# Patient Record
Sex: Male | Born: 1950 | Race: White | Hispanic: No | Marital: Married
Health system: Midwestern US, Academic
[De-identification: ages and names within clinical notes are randomized; demographics above are authoritative.]

## PROBLEM LIST (undated history)

## (undated) DIAGNOSIS — I10 Essential (primary) hypertension: Secondary | ICD-10-CM

## (undated) DIAGNOSIS — E785 Hyperlipidemia, unspecified: Secondary | ICD-10-CM

## (undated) DIAGNOSIS — F209 Schizophrenia, unspecified: Secondary | ICD-10-CM

## (undated) DIAGNOSIS — F419 Anxiety disorder, unspecified: Secondary | ICD-10-CM

## (undated) DIAGNOSIS — J449 Chronic obstructive pulmonary disease, unspecified: Secondary | ICD-10-CM

## (undated) DIAGNOSIS — K219 Gastro-esophageal reflux disease without esophagitis: Secondary | ICD-10-CM

## (undated) DIAGNOSIS — Z9189 Other specified personal risk factors, not elsewhere classified: Secondary | ICD-10-CM

## (undated) DIAGNOSIS — F32A Depression, unspecified: Secondary | ICD-10-CM

## (undated) DIAGNOSIS — F329 Major depressive disorder, single episode, unspecified: Secondary | ICD-10-CM

## (undated) DIAGNOSIS — B181 Chronic viral hepatitis B without delta-agent: Secondary | ICD-10-CM

---
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
## 2017-08-15 ENCOUNTER — Emergency Department: Payer: Medicare Other

## 2017-08-15 ENCOUNTER — Encounter: Payer: Self-pay | Admitting: Emergency Medicine

## 2017-08-15 ENCOUNTER — Other Ambulatory Visit: Payer: Self-pay

## 2017-08-15 ENCOUNTER — Emergency Department
Admission: EM | Admit: 2017-08-15 | Discharge: 2017-08-15 | Disposition: A | Discharge status: Home / Self Care | Payer: Medicare Other | Attending: Emergency Medicine | Admitting: Emergency Medicine

## 2017-08-15 DIAGNOSIS — F17210 Nicotine dependence, cigarettes, uncomplicated: Secondary | ICD-10-CM | POA: Insufficient documentation

## 2017-08-15 DIAGNOSIS — M25512 Pain in left shoulder: Secondary | ICD-10-CM | POA: Diagnosis present

## 2017-08-15 DIAGNOSIS — M7582 Other shoulder lesions, left shoulder: Secondary | ICD-10-CM

## 2017-08-15 MED ORDER — MELOXICAM 15 MG PO TABS: 15.0000 mg | ORAL_TABLET | Freq: Every day | ORAL | 0 refills | Status: DC

## 2017-08-15 MED ORDER — KETOROLAC TROMETHAMINE 30 MG/ML IJ SOLN
30.0000 mg | Freq: Once | INTRAMUSCULAR | Status: AC
  Administered 2017-08-15: 30 mg via INTRAMUSCULAR
  Filled 2017-08-15: qty 1

## 2017-08-15 NOTE — ED Notes (Signed)
Patient transported to X-ray 

## 2017-08-15 NOTE — ED Provider Notes (Signed)
[facility] EMERGENCY DEPARTMENT Provider Note   CSN: [id number] Arrival date & time: 08/15/17  1748     History   Chief Complaint Chief Complaint  Patient presents with  . Shoulder Injury    HPI Adrian Gillespie is a 67 y.o. male.  Presents today for evaluation of left shoulder pain.  Patient complains of aching left shoulder pain x2 days after falling onto his left shoulder after attempting to help a friend up.  Patient denies injuring any other parts of his body.  No pain except for left proximal shoulder.  He has pain with overhead lifting and relief with shoulder abduction.  He has taken ibuprofen 200 mg every 4-6 hours over the last couple days with mild relief.  No numbness tingling or radicular symptoms.  No headache, head injury, loss of consciousness, nausea vomiting.  No history of left shoulder pain.  HPI  History reviewed. No pertinent past medical history.  There are no active problems to display for this patient.   History reviewed. No pertinent surgical history.      Home Medications    Prior to Admission medications   Medication Sig Start Date End Date Taking? Authorizing Provider  meloxicam (MOBIC) 15 MG tablet Take 1 tablet (15 mg total) by mouth daily. 08/15/17   Evon Slack, PA-C    Family History No family history on file.  Social History Social History   Tobacco Use  . Smoking status: Current Every Day Smoker    Packs/day: 1.00    Types: Cigarettes  Substance Use Topics  . Alcohol use: Not on file  . Drug use: Not on file     Allergies   Patient has no known allergies.   Review of Systems Review of Systems  Constitutional: Negative for fever.  Respiratory: Negative for shortness of breath.   Cardiovascular: Negative for chest pain.  Gastrointestinal: Negative for abdominal pain.  Genitourinary: Negative for difficulty urinating, dysuria and urgency.  Musculoskeletal: Positive for arthralgias. Negative for  back pain, myalgias, neck pain and neck stiffness.  Skin: Negative for rash.  Neurological: Negative for dizziness and headaches.     Physical Exam Updated Vital Signs BP 130/90   Pulse 89   Temp 98.6 F (37 C) (Oral)   Resp 18   Ht 5\' 10"  (1.778 m)   Wt 83.9 kg (185 lb)   SpO2 95%   BMI 26.54 kg/m   Physical Exam  Constitutional: He is oriented to person, place, and time. He appears well-developed and well-nourished.  HENT:  Head: Normocephalic and atraumatic.  Eyes: Conjunctivae are normal.  Neck: Normal range of motion.  Cardiovascular: Normal rate.  Pulmonary/Chest: Effort normal. No respiratory distress.  Musculoskeletal: Normal range of motion.  Examination of the left shoulder shows patient has minimal tenderness along the proximal left shoulder, no clavicular tenderness.  Active abduction and flexion and 90 degrees of passively patient can be increased to 130 and patient can maintain abduction and flexion to 130 degrees.  He has a positive Hawkins, impingement test.  Positive empty can test.  His pain with supraspinatus strength testing but is good internal and external rotation with no discomfort.  He is nervous intact in left upper extremity.  Neurological: He is alert and oriented to person, place, and time.  Skin: Skin is warm. No rash noted.  Psychiatric: He has a normal mood and affect. His behavior is normal. Thought content normal.     ED Treatments / Results  Labs (  all labs ordered are listed, but only abnormal results are displayed) Labs Reviewed - No data to display  EKG None  Radiology Dg Shoulder Left  Result Date: 08/15/2017 CLINICAL DATA:  Patient reports fall onto left shoulder 2 days ago. Reports pain to anterior surface of left shoulder and pain in axillary region of left arm. Denies any previous injuries to left shoulder. EXAM: LEFT SHOULDER - 2+ VIEW COMPARISON:  None. FINDINGS: Glenohumeral joint is intact. No evidence of scapular fracture or  humeral fracture. The acromioclavicular joint is intact. IMPRESSION: No fracture or dislocation. Electronically Signed   By: Genevive BiStewart  Edmunds M.D.   On: 08/15/2017 18:59    Procedures Procedures (including critical care time)  Medications Ordered in ED Medications  ketorolac (TORADOL) 30 MG/ML injection 30 mg (30 mg Intramuscular Given 08/15/17 1829)     Initial Impression / Assessment and Plan / ED Course  I have reviewed the triage vital signs and the nursing notes.  Pertinent labs & imaging results that were available during my care of the patient were reviewed by me and considered in my medical decision making (see chart for details).     67 year old male with left shoulder pain.  Physical exam and history consistent with rotator cuff tendinitis.  X-rays confirm no evidence of acute bony normality.  Patient was given 30 mg of Toradol IM which gave him significant relief of his left shoulder pain and improved range of motion.  Patient will discontinue ibuprofen start Mobic 15 mg daily for 1 week.  Follow-up with orthopedics in 1 week if no improvement of shoulder pain.  He is educated on signs and symptoms to return to the ED for.  Final Clinical Impressions(s) / ED Diagnoses   Final diagnoses:  Rotator cuff tendinitis, left    ED Discharge Orders        Ordered    meloxicam (MOBIC) 15 MG tablet  Daily     08/15/17 1909       Ronnette JuniperGaines, Berlinda Farve C, PA-C 08/15/17 1917    Minna AntisPaduchowski, Kevin, MD 08/15/17 2154

## 2017-08-15 NOTE — ED Notes (Signed)
Returned from XR 

## 2017-08-15 NOTE — ED Notes (Signed)
Pt states he was trying to catch his roommate 3 days ago and hurt his left shoulder. Pt states it is painful to raise his shoulder.  Pt states he has no prior injury to left shoulder.  Pain is 9/10.

## 2017-08-15 NOTE — Discharge Instructions (Addendum)
Please avoid taking ibuprofen for pain.  Take meloxicam 15 mg tablet daily for 1 week as needed for left shoulder pain.  If no improvement after 1 week please follow-up with orthopedist, Dr. Rosita KeaMenz.

## 2017-08-15 NOTE — ED Triage Notes (Signed)
Pt states that his roommate was falling 5 days ago and he attempted to catch him, pt is c/o left shoulder pain with decreased rom

## 2019-04-02 ENCOUNTER — Inpatient Hospital Stay
Admission: EM | Admit: 2019-04-02 | Discharge: 2019-04-05 | DRG: 190 | Disposition: A | Discharge status: Home / Self Care | Payer: Medicare Other | Attending: Internal Medicine | Admitting: Internal Medicine

## 2019-04-02 ENCOUNTER — Emergency Department: Payer: Medicare Other

## 2019-04-02 ENCOUNTER — Other Ambulatory Visit: Payer: Self-pay

## 2019-04-02 ENCOUNTER — Encounter: Payer: Self-pay | Admitting: Emergency Medicine

## 2019-04-02 DIAGNOSIS — I472 Ventricular tachycardia: Secondary | ICD-10-CM

## 2019-04-02 DIAGNOSIS — I4729 Other ventricular tachycardia: Secondary | ICD-10-CM

## 2019-04-02 DIAGNOSIS — I10 Essential (primary) hypertension: Secondary | ICD-10-CM | POA: Diagnosis present

## 2019-04-02 DIAGNOSIS — J449 Chronic obstructive pulmonary disease, unspecified: Secondary | ICD-10-CM

## 2019-04-02 DIAGNOSIS — E876 Hypokalemia: Secondary | ICD-10-CM | POA: Diagnosis present

## 2019-04-02 DIAGNOSIS — Z87891 Personal history of nicotine dependence: Secondary | ICD-10-CM | POA: Diagnosis not present

## 2019-04-02 DIAGNOSIS — I471 Supraventricular tachycardia: Secondary | ICD-10-CM | POA: Diagnosis not present

## 2019-04-02 DIAGNOSIS — Z79899 Other long term (current) drug therapy: Secondary | ICD-10-CM | POA: Diagnosis not present

## 2019-04-02 DIAGNOSIS — F209 Schizophrenia, unspecified: Secondary | ICD-10-CM | POA: Diagnosis present

## 2019-04-02 DIAGNOSIS — J9601 Acute respiratory failure with hypoxia: Secondary | ICD-10-CM

## 2019-04-02 DIAGNOSIS — R9431 Abnormal electrocardiogram [ECG] [EKG]: Secondary | ICD-10-CM | POA: Diagnosis not present

## 2019-04-02 DIAGNOSIS — J440 Chronic obstructive pulmonary disease with acute lower respiratory infection: Secondary | ICD-10-CM | POA: Diagnosis present

## 2019-04-02 DIAGNOSIS — Z20822 Contact with and (suspected) exposure to covid-19: Secondary | ICD-10-CM | POA: Diagnosis present

## 2019-04-02 DIAGNOSIS — T380X5A Adverse effect of glucocorticoids and synthetic analogues, initial encounter: Secondary | ICD-10-CM | POA: Diagnosis not present

## 2019-04-02 DIAGNOSIS — J441 Chronic obstructive pulmonary disease with (acute) exacerbation: Secondary | ICD-10-CM | POA: Diagnosis present

## 2019-04-02 DIAGNOSIS — J209 Acute bronchitis, unspecified: Secondary | ICD-10-CM | POA: Diagnosis not present

## 2019-04-02 HISTORY — DX: Depression, unspecified: F32.A

## 2019-04-02 HISTORY — DX: Schizophrenia, unspecified: F20.9

## 2019-04-02 HISTORY — DX: Chronic obstructive pulmonary disease, unspecified: J44.9

## 2019-04-02 HISTORY — DX: Anxiety disorder, unspecified: F41.9

## 2019-04-02 HISTORY — DX: Other specified personal risk factors, not elsewhere classified: Z91.89

## 2019-04-02 HISTORY — DX: Gastro-esophageal reflux disease without esophagitis: K21.9

## 2019-04-02 HISTORY — DX: Chronic viral hepatitis B without delta-agent: B18.1

## 2019-04-02 HISTORY — DX: Hyperlipidemia, unspecified: E78.5

## 2019-04-02 HISTORY — DX: Essential (primary) hypertension: I10

## 2019-04-02 LAB — CBC WITH DIFFERENTIAL/PLATELET
Abs Immature Granulocytes: 0.04 10*3/uL (ref 0.00–0.07)
Basophils Absolute: 0.1 10*3/uL (ref 0.0–0.1)
Basophils Relative: 1 %
Eosinophils Absolute: 0.6 10*3/uL — ABNORMAL HIGH (ref 0.0–0.5)
Eosinophils Relative: 5 %
HCT: 40.3 % (ref 39.0–52.0)
Hemoglobin: 14 g/dL (ref 13.0–17.0)
Immature Granulocytes: 0 %
Lymphocytes Relative: 17 %
Lymphs Abs: 1.7 10*3/uL (ref 0.7–4.0)
MCH: 29.5 pg (ref 26.0–34.0)
MCHC: 34.7 g/dL (ref 30.0–36.0)
MCV: 85 fL (ref 80.0–100.0)
Monocytes Absolute: 0.9 10*3/uL (ref 0.1–1.0)
Monocytes Relative: 9 %
Neutro Abs: 6.9 10*3/uL (ref 1.7–7.7)
Neutrophils Relative %: 68 %
Platelets: 226 10*3/uL (ref 150–400)
RBC: 4.74 MIL/uL (ref 4.22–5.81)
RDW: 12.7 % (ref 11.5–15.5)
WBC: 10.1 10*3/uL (ref 4.0–10.5)
nRBC: 0 % (ref 0.0–0.2)

## 2019-04-02 LAB — COMPREHENSIVE METABOLIC PANEL
ALT: 11 U/L (ref 0–44)
AST: 15 U/L (ref 15–41)
Albumin: 4.1 g/dL (ref 3.5–5.0)
Alkaline Phosphatase: 70 U/L (ref 38–126)
Anion gap: 10 (ref 5–15)
BUN: <5 mg/dL — ABNORMAL LOW (ref 8–23)
CO2: 26 mmol/L (ref 22–32)
Calcium: 8.5 mg/dL — ABNORMAL LOW (ref 8.9–10.3)
Chloride: 100 mmol/L (ref 98–111)
Creatinine, Ser: 0.8 mg/dL (ref 0.61–1.24)
GFR calc Af Amer: >60 mL/min (ref >60)
GFR calc non Af Amer: >60 mL/min (ref >60)
Glucose, Bld: 118 mg/dL — ABNORMAL HIGH (ref 70–99)
Potassium: 2.8 mmol/L — ABNORMAL LOW (ref 3.5–5.1)
Sodium: 136 mmol/L (ref 135–145)
Total Bilirubin: 0.8 mg/dL (ref 0.3–1.2)
Total Protein: 6.9 g/dL (ref 6.5–8.1)

## 2019-04-02 LAB — CBC
HCT: 40.6 % (ref 39.0–52.0)
Hemoglobin: 13.8 g/dL (ref 13.0–17.0)
MCH: 29.1 pg (ref 26.0–34.0)
MCHC: 34 g/dL (ref 30.0–36.0)
MCV: 85.5 fL (ref 80.0–100.0)
Platelets: 235 10*3/uL (ref 150–400)
RBC: 4.75 MIL/uL (ref 4.22–5.81)
RDW: 12.8 % (ref 11.5–15.5)
WBC: 10.3 10*3/uL (ref 4.0–10.5)
nRBC: 0 % (ref 0.0–0.2)

## 2019-04-02 LAB — POC SARS CORONAVIRUS 2 AG: SARS Coronavirus 2 Ag: NEGATIVE

## 2019-04-02 LAB — HIV ANTIBODY (ROUTINE TESTING W REFLEX): HIV Screen 4th Generation wRfx: NONREACTIVE

## 2019-04-02 LAB — BRAIN NATRIURETIC PEPTIDE: B Natriuretic Peptide: 132 pg/mL — ABNORMAL HIGH (ref 0.0–100.0)

## 2019-04-02 LAB — SARS CORONAVIRUS 2 (TAT 6-24 HRS): SARS Coronavirus 2: NEGATIVE

## 2019-04-02 MED ORDER — LABETALOL HCL 5 MG/ML IV SOLN: 10.0000 mg | Freq: Four times a day (QID) | INTRAVENOUS | Status: DC | PRN

## 2019-04-02 MED ORDER — ENOXAPARIN SODIUM 40 MG/0.4ML ~~LOC~~ SOLN
40.0000 mg | SUBCUTANEOUS | Status: DC
  Administered 2019-04-02 – 2019-04-04 (×3): 40 mg via SUBCUTANEOUS
  Filled 2019-04-02 (×3): qty 0.4

## 2019-04-02 MED ORDER — PANTOPRAZOLE SODIUM 40 MG PO TBEC
40.0000 mg | DELAYED_RELEASE_TABLET | Freq: Every day | ORAL | Status: DC
  Administered 2019-04-02 – 2019-04-05 (×4): 40 mg via ORAL
  Filled 2019-04-02 (×4): qty 1

## 2019-04-02 MED ORDER — TIOTROPIUM BROMIDE MONOHYDRATE 18 MCG IN CAPS
18.0000 ug | ORAL_CAPSULE | Freq: Every day | RESPIRATORY_TRACT | Status: DC
  Administered 2019-04-02 – 2019-04-03 (×2): 18 ug via RESPIRATORY_TRACT
  Filled 2019-04-02: qty 5

## 2019-04-02 MED ORDER — BENZTROPINE MESYLATE 1 MG PO TABS
1.0000 mg | ORAL_TABLET | Freq: Every day | ORAL | Status: DC
  Administered 2019-04-02 – 2019-04-05 (×4): 1 mg via ORAL
  Filled 2019-04-02 (×5): qty 1

## 2019-04-02 MED ORDER — ONDANSETRON HCL 4 MG/2ML IJ SOLN: 4.0000 mg | Freq: Four times a day (QID) | INTRAMUSCULAR | Status: DC | PRN

## 2019-04-02 MED ORDER — IPRATROPIUM-ALBUTEROL 0.5-2.5 (3) MG/3ML IN SOLN
3.0000 mL | Freq: Once | RESPIRATORY_TRACT | Status: AC
  Administered 2019-04-02: 3 mL via RESPIRATORY_TRACT
  Filled 2019-04-02: qty 3

## 2019-04-02 MED ORDER — IPRATROPIUM-ALBUTEROL 0.5-2.5 (3) MG/3ML IN SOLN
3.0000 mL | Freq: Once | RESPIRATORY_TRACT | Status: AC
  Administered 2019-04-02: 14:00:00 3 mL via RESPIRATORY_TRACT
  Filled 2019-04-02: qty 3

## 2019-04-02 MED ORDER — POTASSIUM CHLORIDE IN NACL 40-0.9 MEQ/L-% IV SOLN
INTRAVENOUS | Status: DC
  Administered 2019-04-02 – 2019-04-03 (×2): 75 mL/h via INTRAVENOUS
  Filled 2019-04-02 (×3): qty 1000

## 2019-04-02 MED ORDER — IPRATROPIUM-ALBUTEROL 0.5-2.5 (3) MG/3ML IN SOLN
3.0000 mL | Freq: Four times a day (QID) | RESPIRATORY_TRACT | Status: DC | PRN
  Administered 2019-04-03: 3 mL via RESPIRATORY_TRACT
  Filled 2019-04-02: qty 3

## 2019-04-02 MED ORDER — POTASSIUM CHLORIDE CRYS ER 20 MEQ PO TBCR
40.0000 meq | EXTENDED_RELEASE_TABLET | Freq: Once | ORAL | Status: AC
  Administered 2019-04-02: 40 meq via ORAL
  Filled 2019-04-02: qty 2

## 2019-04-02 MED ORDER — IPRATROPIUM-ALBUTEROL 0.5-2.5 (3) MG/3ML IN SOLN
3.00 | RESPIRATORY_TRACT | Status: DC
Start: 2019-03-31 — End: 2019-04-02

## 2019-04-02 MED ORDER — HALOPERIDOL 5 MG PO TABS
10.0000 mg | ORAL_TABLET | Freq: Two times a day (BID) | ORAL | Status: DC
  Administered 2019-04-02 – 2019-04-05 (×6): 10 mg via ORAL
  Filled 2019-04-02 (×7): qty 2

## 2019-04-02 MED ORDER — METHYLPREDNISOLONE SODIUM SUCC 125 MG IJ SOLR
125.0000 mg | Freq: Once | INTRAMUSCULAR | Status: AC
  Administered 2019-04-02: 13:00:00 125 mg via INTRAVENOUS
  Filled 2019-04-02: qty 2

## 2019-04-02 MED ORDER — ACETAMINOPHEN 325 MG PO TABS
650.0000 mg | ORAL_TABLET | Freq: Four times a day (QID) | ORAL | Status: DC | PRN
  Administered 2019-04-03: 650 mg via ORAL
  Filled 2019-04-02: qty 2

## 2019-04-02 MED ORDER — ACETAMINOPHEN 650 MG RE SUPP: 650.0000 mg | Freq: Four times a day (QID) | RECTAL | Status: DC | PRN

## 2019-04-02 MED ORDER — METHYLPREDNISOLONE SODIUM SUCC 40 MG IJ SOLR
40.0000 mg | Freq: Two times a day (BID) | INTRAMUSCULAR | Status: DC
  Administered 2019-04-02 – 2019-04-05 (×6): 40 mg via INTRAVENOUS
  Filled 2019-04-02 (×6): qty 1

## 2019-04-02 MED ORDER — ONDANSETRON HCL 4 MG PO TABS: 4.0000 mg | ORAL_TABLET | Freq: Four times a day (QID) | ORAL | Status: DC | PRN

## 2019-04-02 MED ORDER — SODIUM CHLORIDE 0.9 % IV SOLN
500.0000 mg | INTRAVENOUS | Status: DC
  Administered 2019-04-02: 500 mg via INTRAVENOUS
  Filled 2019-04-02 (×2): qty 500

## 2019-04-02 NOTE — ED Notes (Signed)
Pt provided meal tray

## 2019-04-02 NOTE — ED Notes (Signed)
Pt provided warm blanket and updated on plan of c are

## 2019-04-02 NOTE — ED Notes (Signed)
Report given to inpatient RN. Transportation requested.  

## 2019-04-02 NOTE — ED Provider Notes (Signed)
[facility] Emergency Department Provider Note    First MD Initiated Contact with Patient 04/02/19 1150     (approximate)  I have reviewed the triage vital signs and the nursing notes.   HISTORY  Chief Complaint Shortness of Breath    HPI Adrian Gillespie is a 69 y.o. male with recent mission the hospital for pneumonia presents the ER for worsening shortness of breath.  Reportedly found by EMS when fire rescue already had patient on nonrebreather due to respiratory distress no report of what his O2 sat was.  States been having wet productive cough.  Complete his antibiotics but does not feel like he is improving.  Just had Covid testing that was negative yesterday.  Does have a history of smoking.  Does not wear home oxygen.  Arrives ER on 4 L nasal cannula.    History reviewed. No pertinent past medical history. History reviewed. No pertinent family history. History reviewed. No pertinent surgical history. There are no problems to display for this patient.     Prior to Admission medications   Not on File    Allergies Patient has no known allergies.    Social History Social History   Tobacco Use  . Smoking status: Current Every Day Smoker    Packs/day: 1.00    Types: Cigarettes  . Smokeless tobacco: Never Used  . Tobacco comment: quit 1 month ago  Substance Use Topics  . Alcohol use: Not on file  . Drug use: Not on file    Review of Systems Patient denies headaches, rhinorrhea, blurry vision, numbness, shortness of breath, chest pain, edema, cough, abdominal pain, nausea, vomiting, diarrhea, dysuria, fevers, rashes or hallucinations unless otherwise stated above in HPI. ____________________________________________   PHYSICAL EXAM:  VITAL SIGNS: Vitals:   04/02/19 1330 04/02/19 1430  BP: (!) 170/102 (!) 155/99  Pulse: 89 73  Resp:    SpO2: 90% 95%    Constitutional: Alert and oriented.  Eyes: Conjunctivae are normal.  Head:  Atraumatic. Nose: No congestion/rhinnorhea. Mouth/Throat: Mucous membranes are moist.   Neck: No stridor. Painless ROM.  Cardiovascular: Normal rate, regular rhythm. Grossly normal heart sounds.  Good peripheral circulation. Respiratory: Normal respiratory effort.  No retractions. Lungs with coarse wheeze and rhonchi throughout all lung fields Gastrointestinal: Soft and nontender. No distention. No abdominal bruits. No CVA tenderness. Genitourinary:  Musculoskeletal: No lower extremity tenderness nor edema.  No joint effusions. Neurologic:  Normal speech and language. No gross focal neurologic deficits are appreciated. No facial droop Skin:  Skin is warm, dry and intact. No rash noted. Psychiatric: Mood and affect are normal. Speech and behavior are normal.  ____________________________________________   LABS (all labs ordered are listed, but only abnormal results are displayed)  Results for orders placed or performed during the hospital encounter of 04/02/19 (from the past 24 hour(s))  CBC with Differential/Platelet     Status: Abnormal   Collection Time: 04/02/19 11:57 AM  Result Value Ref Range   WBC 10.1 4.0 - 10.5 K/uL   RBC 4.74 4.22 - 5.81 MIL/uL   Hemoglobin 14.0 13.0 - 17.0 g/dL   HCT 68.1 15.7 - 26.2 %   MCV 85.0 80.0 - 100.0 fL   MCH 29.5 26.0 - 34.0 pg   MCHC 34.7 30.0 - 36.0 g/dL   RDW 03.5 59.7 - 41.6 %   Platelets 226 150 - 400 K/uL   nRBC 0.0 0.0 - 0.2 %   Neutrophils Relative % 68 %   Neutro  Abs 6.9 1.7 - 7.7 K/uL   Lymphocytes Relative 17 %   Lymphs Abs 1.7 0.7 - 4.0 K/uL   Monocytes Relative 9 %   Monocytes Absolute 0.9 0.1 - 1.0 K/uL   Eosinophils Relative 5 %   Eosinophils Absolute 0.6 (H) 0.0 - 0.5 K/uL   Basophils Relative 1 %   Basophils Absolute 0.1 0.0 - 0.1 K/uL   Immature Granulocytes 0 %   Abs Immature Granulocytes 0.04 0.00 - 0.07 K/uL  Comprehensive metabolic panel     Status: Abnormal   Collection Time: 04/02/19 11:57 AM  Result Value Ref  Range   Sodium 136 135 - 145 mmol/L   Potassium 2.8 (L) 3.5 - 5.1 mmol/L   Chloride 100 98 - 111 mmol/L   CO2 26 22 - 32 mmol/L   Glucose, Bld 118 (H) 70 - 99 mg/dL   BUN <5 (L) 8 - 23 mg/dL   Creatinine, Ser 3.23 0.61 - 1.24 mg/dL   Calcium 8.5 (L) 8.9 - 10.3 mg/dL   Total Protein 6.9 6.5 - 8.1 g/dL   Albumin 4.1 3.5 - 5.0 g/dL   AST 15 15 - 41 U/L   ALT 11 0 - 44 U/L   Alkaline Phosphatase 70 38 - 126 U/L   Total Bilirubin 0.8 0.3 - 1.2 mg/dL   GFR calc non Af Amer >60 >60 mL/min   GFR calc Af Amer >60 >60 mL/min   Anion gap 10 5 - 15  Brain natriuretic peptide     Status: Abnormal   Collection Time: 04/02/19 11:57 AM  Result Value Ref Range   B Natriuretic Peptide 132.0 (H) 0.0 - 100.0 pg/mL  POC SARS Coronavirus 2 Ag     Status: None   Collection Time: 04/02/19  2:15 PM  Result Value Ref Range   SARS Coronavirus 2 Ag NEGATIVE NEGATIVE   ____________________________________________  EKG My review and personal interpretation at Time: 11:49   Indication: sob  Rate: 90  Rhythm: sinus Axis: normal Other: normal intervals, no stemi ____________________________________________  RADIOLOGY  I personally reviewed all radiographic images ordered to evaluate for the above acute complaints and reviewed radiology reports and findings.  These findings were personally discussed with the patient.  Please see medical record for radiology report.  ____________________________________________   PROCEDURES  Procedure(s) performed:  .Critical Care Performed by: Willy Eddy, MD Authorized by: Willy Eddy, MD   Critical care provider statement:    Critical care time (minutes):  30   Critical care time was exclusive of:  Separately billable procedures and treating other patients   Critical care was necessary to treat or prevent imminent or life-threatening deterioration of the following conditions:  Respiratory failure   Critical care was time spent personally by me on the  following activities:  Development of treatment plan with patient or surrogate, discussions with consultants, evaluation of patient's response to treatment, examination of patient, obtaining history from patient or surrogate, ordering and performing treatments and interventions, ordering and review of laboratory studies, ordering and review of radiographic studies, pulse oximetry, re-evaluation of patient's condition and review of old charts      Critical Care performed: yes ____________________________________________   INITIAL IMPRESSION / ASSESSMENT AND PLAN / ED COURSE  Pertinent labs & imaging results that were available during my care of the patient were reviewed by me and considered in my medical decision making (see chart for details).   DDX: Asthma, copd, CHF, pna, ptx, malignancy, Pe, anemia   Adrian Gillespie is a 68 y.o. who presents to the ED with shortness of breath cough as described above.  Patient arrived with EMS with significant tachypnea and wheezing.  Was reported to be in respiratory distress upon their arrival.  Currently protecting his airway.  Exam concerning for COPD with diffuse wheezing.  Blood work will be sent for the but differential.  Will give nebulizers as well as steroids.  Clinical Course as of Apr 02 1446  Sat Apr 02, 2019  1255 Presentation most consistent with COPD exacerbation.  Will give additional nebulizer steroids and observe.   [PR]  1406 Patient has improved work of breathing after nebulizers but noted to be desaturating to the 80s.  Placed on 2 L nasal cannula feels more improved.  Suspect this is more consistent with COPD exacerbation.  Will discuss with hospitalist for admission given his hypoxia.   [PR]    Clinical Course User Index [PR] Adrian Lot, MD    The patient was evaluated in Emergency Department today for the symptoms described in the history of present illness. He/she was evaluated in the context of the global COVID-19  pandemic, which necessitated consideration that the patient might be at risk for infection with the SARS-CoV-2 virus that causes COVID-19. Institutional protocols and algorithms that pertain to the evaluation of patients at risk for COVID-19 are in a state of rapid change based on information released by regulatory bodies including the CDC and federal and state organizations. These policies and algorithms were followed during the patient's care in the ED.  As part of my medical decision making, I reviewed the following data within the [place] notes reviewed and incorporated, Labs reviewed, notes from prior ED visits and [place] Controlled Substance Database   ____________________________________________   FINAL CLINICAL IMPRESSION(S) / ED DIAGNOSES  Final diagnoses:  COPD exacerbation ([place])  Acute respiratory failure with hypoxia ([place])      NEW MEDICATIONS STARTED DURING THIS VISIT:  New Prescriptions   No medications on file     Note:  This document was prepared using Dragon voice recognition software and may include unintentional dictation errors.    Adrian Lot, MD 04/02/19 [phone]

## 2019-04-02 NOTE — ED Notes (Signed)
Requested pharmacy to verify potassium replacement.

## 2019-04-02 NOTE — ED Triage Notes (Signed)
Pt to ER via EMS with c/o SHOB.  Pt diagnosed with pneumonia 2 weeks ago and has completed the Abx without improvement.  Pt with noted coarse crackles.  Pt does not wear O2 at home but arrives on 4L [place] by EMS.

## 2019-04-02 NOTE — ED Notes (Signed)
Pt is aox4, nad noted. Pt has a wet sounding cough and coarse crackles noted bilaterally. Pt is pleasant and conversational. Urinal emptied of 900 ml's urine and environment cleaned.

## 2019-04-02 NOTE — H&P (Signed)
History and Physical    Adrian Gillespie GUY:[id number] DOB: 01-May-1950 DOA: 04/02/2019  PCP: Patient, No Pcp Per   Patient coming from: Home  I have personally briefly reviewed patient's old medical records in [facility]  Chief Complaint: Shortness of breath                                Cough  HPI: Adrian Gillespie is a 69 y.o. male with medical history significant for COPD, hypertension, nicotine dependence and schizophrenia who presented to the emergency room by EMS for evaluation of shortness of breath. He had called EMS because he had difficulty breathing and when they arrived patient was placed on a nonrebreather mask for respiratory distress and  hypoxia (not documented). Shortness of breath is mostly at rest and associated with a cough that is  productive.  Patient recently completed a 1 week course of doxycycline without any improvement in his symptoms was seen in the emergency room 2 days ago for complaints of cough, wheezing and shortness of breath.  Room air pulse oximetry post ambulation was 92% during that visit and so patient was discharged home.  He denies having any chest pain, nausea, vomiting, diaphoresis or palpitations.  Denies having any fever or chills, headache or mental status changes. Denies having abdominal pain, urinary frequency or any changes in his bowel habits    ED Course: Patient with a known history of COPD seen in the ER for an acute exacerbation.  Noted to have pulse oximetry on room air of 87 - 88% improved following oxygen supplementation at 2 L.  Patient received a dose of Solu-Medrol in the ER and potassium supplementation and will be admitted to the hospital.  Review of Systems: As per HPI otherwise 10 point review of systems negative.    History reviewed. No pertinent past medical history.  History reviewed. No pertinent surgical history.   reports that he has been smoking cigarettes. He has been smoking about 1.00 pack per day. He has never used  smokeless tobacco. No history on file for alcohol and drug.  No Known Allergies  History reviewed. No pertinent family history.   Prior to Admission medications   Medication Sig Start Date End Date Taking? Authorizing Provider  albuterol (VENTOLIN HFA) 108 (90 Base) MCG/ACT inhaler Inhale 2 puffs into the lungs every 6 (six) hours as needed for shortness of breath or wheezing.   Yes [provider]  benztropine (COGENTIN) 1 MG tablet Take 1 mg by mouth daily.   Yes [provider]  haloperidol (HALDOL) 10 MG tablet Take 10 mg by mouth 2 (two) times daily.   Yes [provider]  omeprazole (PRILOSEC) 20 MG capsule Take 20 mg by mouth daily.   Yes [provider]  tiotropium (SPIRIVA HANDIHALER) 18 MCG inhalation capsule Place 18 mcg into inhaler and inhale daily.   Yes [provider]    Physical Exam: Vitals:   04/02/19 1330 04/02/19 1430 04/02/19 1500 04/02/19 1500  BP: (!) 170/102 (!) 155/99 (!) 157/99   Pulse: 89 73 78   Resp:      Temp:    98 F (36.7 C)  TempSrc:    Oral  SpO2: 90% 95% 94%   Weight:      Height:         Vitals:   04/02/19 1330 04/02/19 1430 04/02/19 1500 04/02/19 1500  BP: (!) 170/102 (!) 155/99 Marland Kitchen)  157/99   Pulse: 89 73 78   Resp:      Temp:    98 F (36.7 C)  TempSrc:    Oral  SpO2: 90% 95% 94%   Weight:      Height:        Constitutional: NAD, alert and oriented to person place and time Eyes: PERRL, lids and conjunctivae normal ENMT: Mucous membranes are moist.  Neck: normal, supple, no masses, no thyromegaly Respiratory: Diffuse wheezing, no crackles. Normal respiratory effort. No accessory muscle use.  Cardiovascular: Regular rate and rhythm, no murmurs / rubs / gallops. No extremity edema. 2+ pedal pulses. No carotid bruits.  Abdomen: no tenderness, no masses palpated. No hepatosplenomegaly. Bowel sounds positive.  Musculoskeletal: no clubbing / cyanosis. No joint deformity upper and lower  extremities.  Skin: no rashes, lesions, ulcers.  Neurologic: No gross focal neurologic deficit. Psychiatric: Normal mood and affect.   Labs on Admission: I have personally reviewed following labs and imaging studies  CBC: Recent Labs  Lab 04/02/19 1157  WBC 10.1  NEUTROABS 6.9  HGB 14.0  HCT 40.3  MCV 85.0  PLT 226   Basic Metabolic Panel: Recent Labs  Lab 04/02/19 1157  NA 136  K 2.8*  CL 100  CO2 26  GLUCOSE 118*  BUN <5*  CREATININE 0.80  CALCIUM 8.5*   GFR: Estimated Creatinine Clearance: 89.6 mL/min (by C-G formula based on SCr of 0.8 mg/dL). Liver Function Tests: Recent Labs  Lab 04/02/19 1157  AST 15  ALT 11  ALKPHOS 70  BILITOT 0.8  PROT 6.9  ALBUMIN 4.1   No results for input(s): LIPASE, AMYLASE in the last 168 hours. No results for input(s): AMMONIA in the last 168 hours. Coagulation Profile: No results for input(s): INR, PROTIME in the last 168 hours. Cardiac Enzymes: No results for input(s): CKTOTAL, CKMB, CKMBINDEX, TROPONINI in the last 168 hours. BNP (last 3 results) No results for input(s): PROBNP in the last 8760 hours. HbA1C: No results for input(s): HGBA1C in the last 72 hours. CBG: No results for input(s): GLUCAP in the last 168 hours. Lipid Profile: No results for input(s): CHOL, HDL, LDLCALC, TRIG, CHOLHDL, LDLDIRECT in the last 72 hours. Thyroid Function Tests: No results for input(s): TSH, T4TOTAL, FREET4, T3FREE, THYROIDAB in the last 72 hours. Anemia Panel: No results for input(s): VITAMINB12, FOLATE, FERRITIN, TIBC, IRON, RETICCTPCT in the last 72 hours. Urine analysis: No results found for: COLORURINE, APPEARANCEUR, LABSPEC, PHURINE, GLUCOSEU, HGBUR, BILIRUBINUR, KETONESUR, PROTEINUR, UROBILINOGEN, NITRITE, LEUKOCYTESUR  Radiological Exams on Admission: DG Chest Portable 1 View  Result Date: 04/02/2019 CLINICAL DATA:  69 year old who was diagnosed with pneumonia 2 weeks ago in has completed antibiotic therapy, presenting  with persistent cough. Current smoker. EXAM: PORTABLE CHEST 1 VIEW COMPARISON:  None. FINDINGS: Cardiac silhouette normal in size. Mild to moderate central peribronchial thickening. Lungs otherwise clear. No localized airspace consolidation. No pleural effusions. No pneumothorax. Normal pulmonary vascularity. IMPRESSION: Mild-to-moderate changes of bronchitis and/or asthma without focal airspace pneumonia. Electronically Signed   By: Hulan Saas M.D.   On: 04/02/2019 12:38    EKG: Independently reviewed.  Sinus rhythm PVCs  Assessment/Plan Principal Problem:   COPD with acute bronchitis (HCC) Active Problems:   Acute on chronic respiratory failure with hypoxia (HCC)   Hypokalemia   Schizophrenia (HCC)    COPD with acute bronchitis Patient with a history of COPD sent emergency room for evaluation of worsening shortness of breath at rest associated with a cough productive of  phlegm and diffuse wheezes Failed outpatient antibiotic therapy Start patient on systemic steroids and bronchodilator therapy Continue Spiriva Start patient on Zithromax 500 mg IV daily  Acute hypoxic respiratory failure Due to acute COPD exacerbation Room air at rest patient had a pulse oximetry of 87 - 88% Continue oxygen supplementation at 2 L to maintain pulse oximetry greater than 92% Patient will need to be assessed for home oxygen prior to discharge   Hypokalemia Supplement potassium   Schizophrenia Continue Haldol and Cogentin DVT prophylaxis: Lovenox Code Status: Full code Family Communication: Plan of care was discussed with patient in detail. He verbalizes understanding and agrees with the plan Disposition Plan: Back to previous home environment Consults called: None    Elice Crigger MD [facility]     04/02/2019, 3:19 PM

## 2019-04-03 DIAGNOSIS — J9601 Acute respiratory failure with hypoxia: Secondary | ICD-10-CM

## 2019-04-03 DIAGNOSIS — I4729 Other ventricular tachycardia: Secondary | ICD-10-CM

## 2019-04-03 DIAGNOSIS — I472 Ventricular tachycardia: Secondary | ICD-10-CM

## 2019-04-03 LAB — CBC
HCT: 39.2 % (ref 39.0–52.0)
Hemoglobin: 13.6 g/dL (ref 13.0–17.0)
MCH: 29.6 pg (ref 26.0–34.0)
MCHC: 34.7 g/dL (ref 30.0–36.0)
MCV: 85.2 fL (ref 80.0–100.0)
Platelets: 237 10*3/uL (ref 150–400)
RBC: 4.6 MIL/uL (ref 4.22–5.81)
RDW: 12.9 % (ref 11.5–15.5)
WBC: 12.5 10*3/uL — ABNORMAL HIGH (ref 4.0–10.5)
nRBC: 0 % (ref 0.0–0.2)

## 2019-04-03 LAB — BASIC METABOLIC PANEL
Anion gap: 10 (ref 5–15)
BUN: 6 mg/dL — ABNORMAL LOW (ref 8–23)
CO2: 25 mmol/L (ref 22–32)
Calcium: 8.7 mg/dL — ABNORMAL LOW (ref 8.9–10.3)
Chloride: 104 mmol/L (ref 98–111)
Creatinine, Ser: 0.85 mg/dL (ref 0.61–1.24)
GFR calc Af Amer: >60 mL/min (ref >60)
GFR calc non Af Amer: >60 mL/min (ref >60)
Glucose, Bld: 134 mg/dL — ABNORMAL HIGH (ref 70–99)
Potassium: 4.2 mmol/L (ref 3.5–5.1)
Sodium: 139 mmol/L (ref 135–145)

## 2019-04-03 LAB — MAGNESIUM: Magnesium: 2.3 mg/dL (ref 1.7–2.4)

## 2019-04-03 MED ORDER — DM-GUAIFENESIN ER 30-600 MG PO TB12
1.0000 | ORAL_TABLET | Freq: Two times a day (BID) | ORAL | Status: DC
  Administered 2019-04-03 – 2019-04-05 (×5): 1 via ORAL
  Filled 2019-04-03 (×5): qty 1

## 2019-04-03 MED ORDER — IPRATROPIUM-ALBUTEROL 0.5-2.5 (3) MG/3ML IN SOLN
3.0000 mL | Freq: Four times a day (QID) | RESPIRATORY_TRACT | Status: DC
  Administered 2019-04-03 – 2019-04-05 (×7): 3 mL via RESPIRATORY_TRACT
  Filled 2019-04-03 (×7): qty 3

## 2019-04-03 MED ORDER — AZITHROMYCIN 250 MG PO TABS: 500.0000 mg | ORAL_TABLET | Freq: Every day | ORAL | Status: DC

## 2019-04-03 MED ORDER — ASPIRIN EC 81 MG PO TBEC
81.0000 mg | DELAYED_RELEASE_TABLET | Freq: Every day | ORAL | Status: DC
  Administered 2019-04-03 – 2019-04-05 (×3): 81 mg via ORAL
  Filled 2019-04-03 (×3): qty 1

## 2019-04-03 MED ORDER — GUAIFENESIN-DM 100-10 MG/5ML PO SYRP
5.0000 mL | ORAL_SOLUTION | ORAL | Status: DC | PRN
  Administered 2019-04-03: 5 mL via ORAL
  Filled 2019-04-03: qty 5

## 2019-04-03 MED ORDER — AZITHROMYCIN 250 MG PO TABS
250.0000 mg | ORAL_TABLET | Freq: Every day | ORAL | Status: DC
  Administered 2019-04-03 – 2019-04-05 (×3): 250 mg via ORAL
  Filled 2019-04-03 (×3): qty 1

## 2019-04-03 NOTE — Progress Notes (Signed)
PROGRESS NOTE                                                                                                                                                                                                             Patient Demographics:    Adrian Gillespie, is a 69 y.o. male, DOB - [birth year], [id number]  Admit date - 04/02/2019   Admitting Physician Lucile Shutters, MD  Outpatient Primary MD for the patient is Patient, No Pcp Per  LOS - 1    Chief Complaint  Patient presents with  . Shortness of Breath       Brief Narrative 69 year old male with history of COPD, hypertension and schizophrenia presented with increasing shortness of breath for past few days.  When EMS arrived he was found to be in respiratory distress and placed on a nonrebreather mask. Patient reports that he recently completed 1 week course of doxycycline for his symptoms without any improvement.  Reports having greenish phlegm associated with cough. In the ED he was hypoxic with O2 sat 8788% on room air, improved with 2 L supplemental oxygen.  Given IV Solu-Medrol and nebs and admitted to hospital service.     Subjective:   Reports still being short of breath on exertion.  Having productive green phlegm.   Assessment  & Plan :    Principal Problem:   COPD with acute bronchitis (HCC) Acute respiratory failure with hypoxia (HCC) Continue IV Solu-Medrol.  Switch to scheduled nebs.  Add Z-Pak and antitussives. Reports he has quit smoking for several months now.  Stable on 2 L currently.  Wean oxygen as tolerated and assess for home O2 need. Patient reports that he does not have a PCP and will consult TOC to assist with establishing care as outpatient    Active Problems:    Hypokalemia Replenished    Schizophrenia (HCC) Resume home dose Haldol and Cogentin.  Check EKG for QTC.  PVCs and NSVT. Patient was hypokalemic on presentation that  has been replenished.  Repeat K and magnesium normal.  Check EKG to monitor QT prolongation.   Patient needs to continue to be monitored in an inpatient setting due to active COPD exacerbation and respiratory failure.  Code Status : Full code  Family Communication  : None  Disposition Plan  : Home possibly in the next 24-48 hours  if respiratory symptoms improved  Barriers For Discharge : Acute COPD exacerbation/respiratory failure  Consults  : None  Procedures  : None  DVT Prophylaxis  :  Lovenox -  Lab Results  Component Value Date   PLT 237 04/03/2019    Antibiotics  :    Anti-infectives (From admission, onward)   Start     Dose/Rate Route Frequency Ordered Stop   04/03/19 1000  azithromycin (ZITHROMAX) tablet 500 mg  Status:  Discontinued     500 mg Oral Daily 04/03/19 0844 04/03/19 0844   04/03/19 1000  azithromycin (ZITHROMAX) tablet 250 mg     250 mg Oral Daily 04/03/19 0844     04/02/19 1515  azithromycin (ZITHROMAX) 500 mg in sodium chloride 0.9 % 250 mL IVPB  Status:  Discontinued     500 mg 250 mL/hr over 60 Minutes Intravenous Every 24 hours 04/02/19 1511 04/03/19 0844        Objective:   Vitals:   04/02/19 1927 04/03/19 0628 04/03/19 0813 04/03/19 1222  BP: (!) 143/90 129/87 112/78 (!) 146/91  Pulse: 71 74 66 70  Resp: 18 16 18 18   Temp: 98.2 F (36.8 C) (!) 97.3 F (36.3 C) 98 F (36.7 C) 98 F (36.7 C)  TempSrc: Oral Oral    SpO2: 96% 93% 94% 94%  Weight:      Height:        Wt Readings from Last 3 Encounters:  04/02/19 81.6 kg  08/15/17 83.9 kg     Intake/Output Summary (Last 24 hours) at 04/03/2019 1334 Last data filed at 04/03/2019 1228 Gross per 24 hour  Intake 2205.59 ml  Output 1975 ml  Net 230.59 ml     Physical Exam  Gen: not in distress HEENT:  moist mucosa, supple neck Chest: Diffuse bilateral rhonchi CVS: N S1&S2, no murmurs GI: soft, NT, ND,  Musculoskeletal: warm, no edema     Data Review:    CBC Recent Labs   Lab 04/02/19 1157 04/02/19 1605 04/03/19 0515  WBC 10.1 10.3 12.5*  HGB 14.0 13.8 13.6  HCT 40.3 40.6 39.2  PLT 226 235 237  MCV 85.0 85.5 85.2  MCH 29.5 29.1 29.6  MCHC 34.7 34.0 34.7  RDW 12.7 12.8 12.9  LYMPHSABS 1.7  --   --   MONOABS 0.9  --   --   EOSABS 0.6*  --   --   BASOSABS 0.1  --   --     Chemistries  Recent Labs  Lab 04/02/19 1157 04/03/19 0515  NA 136 139  K 2.8* 4.2  CL 100 104  CO2 26 25  GLUCOSE 118* 134*  BUN <5* 6*  CREATININE 0.80 0.85  CALCIUM 8.5* 8.7*  MG  --  2.3  AST 15  --   ALT 11  --   ALKPHOS 70  --   BILITOT 0.8  --    ------------------------------------------------------------------------------------------------------------------ No results for input(s): CHOL, HDL, LDLCALC, TRIG, CHOLHDL, LDLDIRECT in the last 72 hours.  No results found for: HGBA1C ------------------------------------------------------------------------------------------------------------------ No results for input(s): TSH, T4TOTAL, T3FREE, THYROIDAB in the last 72 hours.  Invalid input(s): FREET3 ------------------------------------------------------------------------------------------------------------------ No results for input(s): VITAMINB12, FOLATE, FERRITIN, TIBC, IRON, RETICCTPCT in the last 72 hours.  Coagulation profile No results for input(s): INR, PROTIME in the last 168 hours.  No results for input(s): DDIMER in the last 72 hours.  Cardiac Enzymes No results for input(s): CKMB, TROPONINI, MYOGLOBIN in the last 168 hours.  Invalid input(s): CK ------------------------------------------------------------------------------------------------------------------  Component Value Date/Time   BNP 132.0 (H) 04/02/2019 1157    Inpatient Medications  Scheduled Meds: . azithromycin  250 mg Oral Daily  . benztropine  1 mg Oral Daily  . dextromethorphan-guaiFENesin  1 tablet Oral BID  . enoxaparin (LOVENOX) injection  40 mg Subcutaneous Q24H  .  haloperidol  10 mg Oral BID  . methylPREDNISolone (SOLU-MEDROL) injection  40 mg Intravenous Q12H  . pantoprazole  40 mg Oral Daily  . tiotropium  18 mcg Inhalation Daily   Continuous Infusions: PRN Meds:.acetaminophen **OR** acetaminophen, guaiFENesin-dextromethorphan, ipratropium-albuterol, labetalol, ondansetron **OR** ondansetron (ZOFRAN) IV  Micro Results Recent Results (from the past 240 hour(s))  SARS CORONAVIRUS 2 (TAT 6-24 HRS) Nasopharyngeal Nasopharyngeal Swab     Status: None   Collection Time: 04/02/19  2:48 PM   Specimen: Nasopharyngeal Swab  Result Value Ref Range Status   SARS Coronavirus 2 NEGATIVE NEGATIVE Final    Comment: (NOTE) SARS-CoV-2 target nucleic acids are NOT DETECTED. The SARS-CoV-2 RNA is generally detectable in upper and lower respiratory specimens during the acute phase of infection. Negative results do not preclude SARS-CoV-2 infection, do not rule out co-infections with other pathogens, and should not be used as the sole basis for treatment or other patient management decisions. Negative results must be combined with clinical observations, patient history, and epidemiological information. The expected result is Negative. Fact Sheet for Patients: SugarRoll.be Fact Sheet for Healthcare Providers: https://www.woods-mathews.com/ This test is not yet approved or cleared by the [place] FDA and  has been authorized for detection and/or diagnosis of SARS-CoV-2 by FDA under an Emergency Use Authorization (EUA). This EUA will remain  in effect (meaning this test can be used) for the duration of the COVID-19 declaration under Section 56 4(b)(1) of the Act, 21 U.S.C. section 360bbb-3(b)(1), unless the authorization is terminated or revoked sooner. Performed at [facility], [place] [address] [place], [place] [address]     Radiology Reports DG Chest Portable 1 View  Result Date: 04/02/2019 CLINICAL  DATA:  69 year old who was diagnosed with pneumonia 2 weeks ago in has completed antibiotic therapy, presenting with persistent cough. Current smoker. EXAM: PORTABLE CHEST 1 VIEW COMPARISON:  None. FINDINGS: Cardiac silhouette normal in size. Mild to moderate central peribronchial thickening. Lungs otherwise clear. No localized airspace consolidation. No pleural effusions. No pneumothorax. Normal pulmonary vascularity. IMPRESSION: Mild-to-moderate changes of bronchitis and/or asthma without focal airspace pneumonia. Electronically Signed   By: Evangeline Dakin M.D.   On: 04/02/2019 12:38    Time Spent in minutes  35   Dequavius Kuhner M.D on 04/03/2019 at 1:34 PM  Between 7am to 7pm - Pager - [phone]  After 7pm go to [email or web address] - password [facility]  [facility] -  Office  [phone]

## 2019-04-04 DIAGNOSIS — J441 Chronic obstructive pulmonary disease with (acute) exacerbation: Principal | ICD-10-CM

## 2019-04-04 LAB — BASIC METABOLIC PANEL
Anion gap: 8 (ref 5–15)
BUN: 15 mg/dL (ref 8–23)
CO2: 25 mmol/L (ref 22–32)
Calcium: 8.9 mg/dL (ref 8.9–10.3)
Chloride: 106 mmol/L (ref 98–111)
Creatinine, Ser: 0.93 mg/dL (ref 0.61–1.24)
GFR calc Af Amer: >60 mL/min (ref >60)
GFR calc non Af Amer: >60 mL/min (ref >60)
Glucose, Bld: 150 mg/dL — ABNORMAL HIGH (ref 70–99)
Potassium: 3.8 mmol/L (ref 3.5–5.1)
Sodium: 139 mmol/L (ref 135–145)

## 2019-04-04 LAB — MAGNESIUM: Magnesium: 2.2 mg/dL (ref 1.7–2.4)

## 2019-04-04 NOTE — Progress Notes (Addendum)
PROGRESS NOTE                                                                                                                                                                                                             Patient Demographics:    Adrian Gillespie, is a 69 y.o. male, DOB - Aug 03, 1950, [id number]  Admit date - 04/02/2019   Admitting Physician Lucile Shutters, MD  Outpatient Primary MD for the patient is Andres Shad, MD  LOS - 2    Chief Complaint  Patient presents with  . Shortness of Breath       Brief Narrative 69 year old male with history of COPD, hypertension and schizophrenia presented with increasing shortness of breath for past few days.  When EMS arrived he was found to be in respiratory distress and placed on a nonrebreather mask. Patient reports that he recently completed 1 week course of doxycycline for his symptoms without any improvement.  Reports having greenish phlegm associated with cough. In the ED he was hypoxic with O2 sat 87-88% on room air, improved with 2 L supplemental oxygen.  Given IV Solu-Medrol and nebs and admitted to hospital service.     Subjective:   Still feels very wheezy and dyspnea not much improved from yesterday.  Still having productive cough.  Afebrile   Assessment  & Plan :    Principal Problem:   COPD with acute bronchitis (HCC) Acute respiratory failure with hypoxia (HCC) Continue IV Solu-Medrol and scheduled nebs.  Symptoms not improved from yesterday.  Continue Z-Pak and antitussives. Reports quitting smoking for 1 month.  Sats maintained on 2 L nasal cannula and wean as tolerated.  Add Z-Pak and antitussives. Will need to establish care with PCP as outpatient.    Active Problems: PVCs and NSVT's Still having runs of NSVT's.  Potassium and magnesium replenished.  Recheck again today.  EKG with normal QTC.  Will obtain 2D echo to rule out any  structural heart disease.  Continue telemetry monitoring.    Hypokalemia Replenished    Schizophrenia (HCC) Continue Haldol and Cogentin.  QTC stable.   Ongoing respiratory failure with shortness of breath and wheezing requiring IV steroid and scheduled nebs for which patient needs to be monitored in an inpatient setting.  Code Status : Full code  Family Communication  : None  Disposition Plan  :  Home possibly in 1-2 days if dyspnea and respiratory failure improved.  Barriers For Discharge : Acute COPD exacerbation/respiratory failure  Consults  : None  Procedures  : None  DVT Prophylaxis  :  Lovenox -  Lab Results  Component Value Date   PLT 237 04/03/2019    Antibiotics  :    Anti-infectives (From admission, onward)   Start     Dose/Rate Route Frequency Ordered Stop   04/03/19 1000  azithromycin (ZITHROMAX) tablet 500 mg  Status:  Discontinued     500 mg Oral Daily 04/03/19 0844 04/03/19 0844   04/03/19 1000  azithromycin (ZITHROMAX) tablet 250 mg     250 mg Oral Daily 04/03/19 0844     04/02/19 1515  azithromycin (ZITHROMAX) 500 mg in sodium chloride 0.9 % 250 mL IVPB  Status:  Discontinued     500 mg 250 mL/hr over 60 Minutes Intravenous Every 24 hours 04/02/19 1511 04/03/19 0844        Objective:   Vitals:   04/03/19 1942 04/04/19 0546 04/04/19 0808 04/04/19 1204  BP:  115/69 128/70 123/82  Pulse:  60 (!) 53 62  Resp:   19 19  Temp:  98.5 F (36.9 C) 98.2 F (36.8 C) 97.9 F (36.6 C)  TempSrc:  Oral  Oral  SpO2: 93% 95% 93% 91%  Weight:      Height:        Wt Readings from Last 3 Encounters:  04/02/19 81.6 kg  08/15/17 83.9 kg     Intake/Output Summary (Last 24 hours) at 04/04/2019 1252 Last data filed at 04/04/2019 0550 Gross per 24 hour  Intake --  Output 1975 ml  Net -1975 ml    Physical exam Not in distress HEENT: Moist with a, supple neck Chest: Diffuse bilateral rhonchi, unchanged from yesterday CVs: Normal S1-S2 GI: Soft,  nondistended, nontender Musculoskeletal: Warm, no edema     Data Review:    CBC Recent Labs  Lab 04/02/19 1157 04/02/19 1605 04/03/19 0515  WBC 10.1 10.3 12.5*  HGB 14.0 13.8 13.6  HCT 40.3 40.6 39.2  PLT 226 235 237  MCV 85.0 85.5 85.2  MCH 29.5 29.1 29.6  MCHC 34.7 34.0 34.7  RDW 12.7 12.8 12.9  LYMPHSABS 1.7  --   --   MONOABS 0.9  --   --   EOSABS 0.6*  --   --   BASOSABS 0.1  --   --     Chemistries  Recent Labs  Lab 04/02/19 1157 04/03/19 0515  NA 136 139  K 2.8* 4.2  CL 100 104  CO2 26 25  GLUCOSE 118* 134*  BUN <5* 6*  CREATININE 0.80 0.85  CALCIUM 8.5* 8.7*  MG  --  2.3  AST 15  --   ALT 11  --   ALKPHOS 70  --   BILITOT 0.8  --    ------------------------------------------------------------------------------------------------------------------ No results for input(s): CHOL, HDL, LDLCALC, TRIG, CHOLHDL, LDLDIRECT in the last 72 hours.  No results found for: HGBA1C ------------------------------------------------------------------------------------------------------------------ No results for input(s): TSH, T4TOTAL, T3FREE, THYROIDAB in the last 72 hours.  Invalid input(s): FREET3 ------------------------------------------------------------------------------------------------------------------ No results for input(s): VITAMINB12, FOLATE, FERRITIN, TIBC, IRON, RETICCTPCT in the last 72 hours.  Coagulation profile No results for input(s): INR, PROTIME in the last 168 hours.  No results for input(s): DDIMER in the last 72 hours.  Cardiac Enzymes No results for input(s): CKMB, TROPONINI, MYOGLOBIN in the last 168 hours.  Invalid input(s): CK ------------------------------------------------------------------------------------------------------------------  Component Value Date/Time   BNP 132.0 (H) 04/02/2019 1157    Inpatient Medications  Scheduled Meds: . aspirin EC  81 mg Oral Daily  . azithromycin  250 mg Oral Daily  . benztropine   1 mg Oral Daily  . dextromethorphan-guaiFENesin  1 tablet Oral BID  . enoxaparin (LOVENOX) injection  40 mg Subcutaneous Q24H  . haloperidol  10 mg Oral BID  . ipratropium-albuterol  3 mL Nebulization Q6H  . methylPREDNISolone (SOLU-MEDROL) injection  40 mg Intravenous Q12H  . pantoprazole  40 mg Oral Daily   Continuous Infusions: PRN Meds:.acetaminophen **OR** acetaminophen, guaiFENesin-dextromethorphan, labetalol, ondansetron **OR** ondansetron (ZOFRAN) IV  Micro Results Recent Results (from the past 240 hour(s))  SARS CORONAVIRUS 2 (TAT 6-24 HRS) Nasopharyngeal Nasopharyngeal Swab     Status: None   Collection Time: 04/02/19  2:48 PM   Specimen: Nasopharyngeal Swab  Result Value Ref Range Status   SARS Coronavirus 2 NEGATIVE NEGATIVE Final    Comment: (NOTE) SARS-CoV-2 target nucleic acids are NOT DETECTED. The SARS-CoV-2 RNA is generally detectable in upper and lower respiratory specimens during the acute phase of infection. Negative results do not preclude SARS-CoV-2 infection, do not rule out co-infections with other pathogens, and should not be used as the sole basis for treatment or other patient management decisions. Negative results must be combined with clinical observations, patient history, and epidemiological information. The expected result is Negative. Fact Sheet for Patients: HairSlick.no Fact Sheet for Healthcare Providers: quierodirigir.com This test is not yet approved or cleared by the [place] FDA and  has been authorized for detection and/or diagnosis of SARS-CoV-2 by FDA under an Emergency Use Authorization (EUA). This EUA will remain  in effect (meaning this test can be used) for the duration of the COVID-19 declaration under Section 56 4(b)(1) of the Act, 21 U.S.C. section 360bbb-3(b)(1), unless the authorization is terminated or revoked sooner. Performed at [facility], [address], [place], [place] [address]     Radiology Reports DG Chest Portable 1 View  Result Date: 04/02/2019 CLINICAL DATA:  69 year old who was diagnosed with pneumonia 2 weeks ago in has completed antibiotic therapy, presenting with persistent cough. Current smoker. EXAM: PORTABLE CHEST 1 VIEW COMPARISON:  None. FINDINGS: Cardiac silhouette normal in size. Mild to moderate central peribronchial thickening. Lungs otherwise clear. No localized airspace consolidation. No pleural effusions. No pneumothorax. Normal pulmonary vascularity. IMPRESSION: Mild-to-moderate changes of bronchitis and/or asthma without focal airspace pneumonia. Electronically Signed   By: Hulan Saas M.D.   On: 04/02/2019 12:38    Time Spent in minutes  35   Reice Bienvenue M.D on 04/04/2019 at 12:52 PM  Between 7am to 7pm - Pager - [phone]  After 7pm go to [email or web address] - password [facility]  [facility] -  Office  [phone]

## 2019-04-04 NOTE — Plan of Care (Signed)
  Problem: Education: Goal: Knowledge of General Education information will improve Description: Including pain rating scale, medication(s)/side effects and non-pharmacologic comfort measures Outcome: Progressing   Problem: Health Behavior/Discharge Planning: Goal: Ability to manage health-related needs will improve Outcome: Not Progressing Note: Patient remains on IV Solu-Medrol, but is still congested and occasionally short of breath. Not to discharge until at least the AM. Will continue to monitor respiratory status for the remainder of the shift. Jari Favre [facility]

## 2019-04-04 NOTE — TOC Initial Note (Signed)
Transition of Care [facility]) - Initial/Assessment Note    Patient Details  Name: Adrian Gillespie MRN: [id number] Date of Birth: Jun 12, 1950  [facility]) CM/SW Contact:    Shawn Route, RN Phone Number: 04/04/2019, 9:49 AM  Clinical Narrative:                  Consulted to help patient with PCP.  Reviewed recent admissions to [facility] and patient is scheduled for an intermal medicine appt on April 14, 2019 with Everlean Cherry, MD .  Spoke with patient and let him know he has an appointment scheduled for March 18th.  Added this information on the AVS No further TOC needs at this time, please re-consult for new needs.   Expected Discharge Plan: Home/Self Care Barriers to Discharge: Continued Medical Work up   Patient Goals and CMS Choice        Expected Discharge Plan and Services Expected Discharge Plan: Home/Self Care       Living arrangements for the past 2 months: Single Family Home                 DME Arranged: N/A DME Agency: NA                  Prior Living Arrangements/Services Living arrangements for the past 2 months: Single Family Home Lives with:: Roommate Patient language and need for interpreter reviewed:: Yes Do you feel safe going back to the place where you live?: Yes      Need for Family Participation in Patient Care: No (Comment) Care giver support system in place?: No (comment)   Criminal Activity/Legal Involvement Pertinent to Current Situation/Hospitalization: No - Comment as needed  Activities of Daily Living Home Assistive Devices/Equipment: Eyeglasses, Oxygen ADL Screening (condition at time of admission) Patient's cognitive ability adequate to safely complete daily activities?: Yes Is the patient deaf or have difficulty hearing?: No Does the patient have difficulty seeing, even when wearing glasses/contacts?: No Does the patient have difficulty concentrating, remembering, or making decisions?: No Patient able to express need  for assistance with ADLs?: Yes Does the patient have difficulty dressing or bathing?: No Independently performs ADLs?: Yes (appropriate for developmental age) Does the patient have difficulty walking or climbing stairs?: No Weakness of Legs: None Weakness of Arms/Hands: None  Permission Sought/Granted                  Emotional Assessment Appearance:: Appears stated age Attitude/Demeanor/Rapport: Engaged Affect (typically observed): Accepting Orientation: : Oriented to Self, Oriented to Place, Oriented to  Time, Oriented to Situation Alcohol / Substance Use: Not Applicable Psych Involvement: No (comment)  Admission diagnosis:  COPD exacerbation (HCC) [J44.1] Acute respiratory failure with hypoxia (HCC) [J96.01] COPD with acute bronchitis (HCC) [J44.0, J20.9] Patient Active Problem List   Diagnosis Date Noted  . NSVT (nonsustained ventricular tachycardia) (HCC)   . COPD with acute bronchitis (HCC) 04/02/2019  . Acute on chronic respiratory failure with hypoxia (HCC) 04/02/2019  . Hypokalemia 04/02/2019  . Schizophrenia (HCC) 04/02/2019   PCP:  Andres Shad, MD Pharmacy:   [facility] [address] [place], [place] - [address] [place] [address] Phone: [phone] Fax: [phone]     Social Determinants of Health (SDOH) Interventions    Readmission Risk Interventions No flowsheet data found.

## 2019-04-05 ENCOUNTER — Encounter: Payer: Self-pay | Admitting: Internal Medicine

## 2019-04-05 ENCOUNTER — Inpatient Hospital Stay (HOSPITAL_COMMUNITY)
Admit: 2019-04-05 | Discharge: 2019-04-05 | Disposition: A | Discharge status: Home / Self Care | Payer: Medicare Other | Attending: Internal Medicine | Admitting: Internal Medicine

## 2019-04-05 DIAGNOSIS — E876 Hypokalemia: Secondary | ICD-10-CM

## 2019-04-05 DIAGNOSIS — R9431 Abnormal electrocardiogram [ECG] [EKG]: Secondary | ICD-10-CM

## 2019-04-05 DIAGNOSIS — J209 Acute bronchitis, unspecified: Secondary | ICD-10-CM

## 2019-04-05 DIAGNOSIS — J440 Chronic obstructive pulmonary disease with acute lower respiratory infection: Secondary | ICD-10-CM

## 2019-04-05 DIAGNOSIS — I471 Supraventricular tachycardia: Secondary | ICD-10-CM

## 2019-04-05 DIAGNOSIS — J441 Chronic obstructive pulmonary disease with (acute) exacerbation: Secondary | ICD-10-CM

## 2019-04-05 DIAGNOSIS — J449 Chronic obstructive pulmonary disease, unspecified: Secondary | ICD-10-CM

## 2019-04-05 LAB — TSH: TSH: 0.498 u[IU]/mL (ref 0.350–4.500)

## 2019-04-05 LAB — ECHOCARDIOGRAM COMPLETE
Height: 69 in
Weight: 2736 oz

## 2019-04-05 MED ORDER — AZITHROMYCIN 250 MG PO TABS: 250.0000 mg | ORAL_TABLET | Freq: Every day | ORAL | 0 refills | Status: AC

## 2019-04-05 MED ORDER — SPIRIVA HANDIHALER 18 MCG IN CAPS: 18.0000 ug | ORAL_CAPSULE | Freq: Every day | RESPIRATORY_TRACT | 3 refills | Status: AC

## 2019-04-05 MED ORDER — PERFLUTREN LIPID MICROSPHERE
1.0000 mL | INTRAVENOUS | Status: AC | PRN
  Administered 2019-04-05: 2 mL via INTRAVENOUS
  Filled 2019-04-05: qty 10

## 2019-04-05 MED ORDER — ALBUTEROL SULFATE (2.5 MG/3ML) 0.083% IN NEBU: 2.5000 mg | INHALATION_SOLUTION | RESPIRATORY_TRACT | Status: DC | PRN

## 2019-04-05 MED ORDER — POTASSIUM CHLORIDE CRYS ER 20 MEQ PO TBCR
20.0000 meq | EXTENDED_RELEASE_TABLET | Freq: Once | ORAL | Status: AC
  Administered 2019-04-05: 20 meq via ORAL
  Filled 2019-04-05: qty 1

## 2019-04-05 MED ORDER — ALBUTEROL SULFATE HFA 108 (90 BASE) MCG/ACT IN AERS: 2.0000 | INHALATION_SPRAY | Freq: Four times a day (QID) | RESPIRATORY_TRACT | 3 refills | Status: AC | PRN

## 2019-04-05 MED ORDER — IPRATROPIUM-ALBUTEROL 0.5-2.5 (3) MG/3ML IN SOLN
3.0000 mL | Freq: Three times a day (TID) | RESPIRATORY_TRACT | Status: DC
  Administered 2019-04-05: 3 mL via RESPIRATORY_TRACT
  Filled 2019-04-05: qty 3

## 2019-04-05 MED ORDER — PREDNISONE 20 MG PO TABS: 20.0000 mg | ORAL_TABLET | Freq: Every day | ORAL | 0 refills | Status: DC

## 2019-04-05 MED ORDER — GUAIFENESIN-DM 100-10 MG/5ML PO SYRP: 5.0000 mL | ORAL_SOLUTION | ORAL | 0 refills | Status: DC | PRN

## 2019-04-05 NOTE — Progress Notes (Signed)
Patient being discharged home in stable condition. Patient verbalized discharge instructions without any questions or concerns. IV taken out and tele monitor off. Patient's brother taking him home.

## 2019-04-05 NOTE — Plan of Care (Signed)

## 2019-04-05 NOTE — Progress Notes (Signed)
Patient ambulated in room with oxygen saturation remaining above 90%, at rest oxygen at 93% on room air.

## 2019-04-05 NOTE — Progress Notes (Signed)
CCMD called to notify patient had 19 beats of SVT. Patient in bed without any complaints. MD notified. Will continue to monitor.

## 2019-04-05 NOTE — Progress Notes (Signed)
*  PRELIMINARY RESULTS* Echocardiogram 2D Echocardiogram has been performed.  Adrian Gillespie 04/05/2019, 9:17 AM

## 2019-04-05 NOTE — Consult Note (Signed)
Cardiology Consultation:   Patient ID: Adrian Gillespie; [id number]; Apr 19, 1950   Admit date: 04/02/2019 Date of Consult: 04/05/2019  Primary Care Provider: Andres Shad, MD Primary Cardiologist: new to [facility] - consult by End    Patient Profile:   Adrian Gillespie is a 69 y.o. 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 Gonzella Gillespie.  History of Present Illness:   Adrian Gillespie has no previously known cardiac history.  He was seen in the [facility] ED in late 02/2019 with shortness of breath and treated for possible early pneumonia.  He was treated with doxycycline.  He returned to the [facility] ED on 03/31/2019 with continued cough and shortness of breath with a noted mild improvement in symptoms with escalation of treatment to include possible COPD exacerbation with addition of Spiriva.  He was admitted to [facility] on 3/6 with continued shortness of breath, cough, and wheezing felt to be related to COPD exacerbation.  Chest x-ray showed mild to moderate changes of bronchitis without focal airspace disease.  COVID-19 negative.  BNP 132.  Potassium 2.8 repleted to 4.2 with a current value of 3.8.  HIV nonreactive.  EKG showed NSR, 91 bpm, 1st degree AV block, rare PVC, no acute st/t changes.  Repeat EKG showed NSR, 71 bpm, first-degree AV block, no acute ST-T changes.  Vitals stable.  He has been treated with azithromycin, steroids, and nebulizers.  On 3/9, clinical staff was notified of a 19 beat run of SVT by CCMD.  Patient was asymptomatic.  He denies any chest pain, palpitations, dizziness, presyncope, syncope.  He notes that since his admission his breathing is significantly improved.  No lower extremity swelling, abdominal tension, orthopnea.  He does continue to have a cough  productive of green to yellow sputum.  Echo showed preserved LVSF with an EF of 55 to 60%, no regional wall motion normalities, normal LV diastolic function, normal RV systolic function and cavity size, no significant valvular abnormality.  He does not have any cardiac concerns or complaints at this time.    Past Medical History:  Diagnosis Date  . Anxiety   . 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 COPD (chronic obstructive pulmonary disease) (HCC)   . Depression   . GERD (gastroesophageal reflux disease)   . 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 HLD (hyperlipidemia)   . HTN (hypertension)   . Schizophrenia (HCC)     History reviewed. No pertinent surgical history.   Home Meds: Prior to Admission medications   Medication Sig Start Date End Date Taking? Authorizing Provider  albuterol (VENTOLIN HFA) 108 (90 Base) MCG/ACT inhaler Inhale 2 puffs into the lungs every 6 (six) hours as needed for shortness of breath or wheezing.   Yes [provider]  aspirin EC 81 MG tablet Take 81 mg by mouth daily.   Yes [provider]  benztropine (COGENTIN) 1 MG tablet Take 1 mg by mouth daily.   Yes [provider]  haloperidol (HALDOL) 10 MG tablet Take 10 mg by mouth 2 (two) times daily.   Yes [provider]  hydrOXYzine (ATARAX/VISTARIL) 50 MG tablet Take 50 mg by mouth daily as needed for anxiety or itching.   Yes [provider]  omeprazole (PRILOSEC) 20 MG  capsule Take 20 mg by mouth daily.   Yes [provider]  tiotropium (SPIRIVA HANDIHALER) 18 MCG inhalation capsule Place 18 mcg into inhaler and inhale daily.   Yes [provider]  traZODone (DESYREL) 100 MG tablet Take 300 mg by mouth at bedtime.   Yes [provider]    Inpatient Medications: Scheduled Meds: . aspirin EC  81 mg Oral Daily  . azithromycin  250 mg Oral Daily  . benztropine  1 mg Oral Daily  . dextromethorphan-guaiFENesin  1  tablet Oral BID  . enoxaparin (LOVENOX) injection  40 mg Subcutaneous Q24H  . haloperidol  10 mg Oral BID  . ipratropium-albuterol  3 mL Nebulization TID  . methylPREDNISolone (SOLU-MEDROL) injection  40 mg Intravenous Q12H  . pantoprazole  40 mg Oral Daily   Continuous Infusions:  PRN Meds: acetaminophen **OR** acetaminophen, albuterol, guaiFENesin-dextromethorphan, labetalol, ondansetron **OR** ondansetron (ZOFRAN) IV  Allergies:  No Known Allergies  Social History:   Social History   Socioeconomic History  . Marital status: Single    Spouse name: Not on file  . Number of children: Not on file  . Years of education: Not on file  . Highest education level: Not on file  Occupational History  . Not on file  Tobacco Use  . Smoking status: Current Every Day Smoker    Packs/day: 1.00    Types: Cigarettes  . Smokeless tobacco: Never Used  . Tobacco comment: quit 1 month ago  Substance and Sexual Activity  . Alcohol use: Not on file  . Drug use: Not on file  . Sexual activity: Not on file  Other Topics Concern  . Not on file  Social History Narrative  . Not on file   Social Determinants of Health   Financial Resource Strain:   . Difficulty of Paying Living Expenses: Not on file  Food Insecurity:   . Worried About Programme researcher, broadcasting/film/video in the Last Year: Not on file  . Ran Out of Food in the Last Year: Not on file  Transportation Needs:   . Lack of Transportation (Medical): Not on file  . Lack of Transportation (Non-Medical): Not on file  Physical Activity:   . Days of Exercise per Week: Not on file  . Minutes of Exercise per Session: Not on file  Stress:   . Feeling of Stress : Not on file  Social Connections:   . Frequency of Communication with Friends and Family: Not on file  . Frequency of Social Gatherings with Friends and Family: Not on file  . Attends Religious Services: Not on file  . Active Member of Clubs or Organizations: Not on file  . Attends Tax inspector Meetings: Not on file  . Marital Status: Not on file  Intimate Partner Violence:   . Fear of Current or Ex-Partner: Not on file  . Emotionally Abused: Not on file  . Physically Abused: Not on file  . Sexually Abused: Not on file     Family History:   Family History  Problem Relation Age of Onset  . Arthritis/Rheumatoid Mother     ROS:  Review of Systems  Constitutional: Positive for malaise/fatigue. Negative for chills, diaphoresis, fever and weight loss.  HENT: Negative for congestion.   Eyes: Negative for discharge and redness.  Respiratory: Positive for cough, sputum production, shortness of breath and wheezing. Negative for hemoptysis.        Yellow to green sputum  Cardiovascular: Negative for chest pain, palpitations, orthopnea,  claudication, leg swelling and PND.  Gastrointestinal: Negative for abdominal pain, heartburn, nausea and vomiting.  Musculoskeletal: Negative for falls and myalgias.  Skin: Negative for rash.  Neurological: Positive for weakness. Negative for dizziness, tingling, tremors, sensory change, speech change, focal weakness and loss of consciousness.  Psychiatric/Behavioral: Negative for substance abuse. The patient is not nervous/anxious.   All other systems reviewed and are negative.     Physical Exam/Data:   Vitals:   04/05/19 0759 04/05/19 1052 04/05/19 1104 04/05/19 1150  BP:    (!) 143/91  Pulse: 63 67  63  Resp: 18   17  Temp:    98.3 F (36.8 C)  TempSrc:    Oral  SpO2: 93% 94% 94% 94%  Weight:      Height:        Intake/Output Summary (Last 24 hours) at 04/05/2019 1228 Last data filed at 04/05/2019 1151 Gross per 24 hour  Intake 240 ml  Output 4325 ml  Net -4085 ml   Filed Weights   04/02/19 1153 04/05/19 0330  Weight: 81.6 kg 77.6 kg   Body mass index is 25.25 kg/m.   Physical Exam: General: Well developed, well nourished, in no acute distress. Head: Normocephalic, atraumatic, sclera non-icteric, no xanthomas,  nares without discharge.  Neck: Negative for carotid bruits. JVD not elevated. Lungs: Rhonchi bilaterally. Breathing is unlabored. Heart: RRR with S1 S2. No murmurs, rubs, or gallops appreciated. Abdomen: Soft, non-tender, non-distended with normoactive bowel sounds. No hepatomegaly. No rebound/guarding. No obvious abdominal masses. Msk:  Strength and tone appear normal for age. Extremities: No clubbing or cyanosis. No edema. Distal pedal pulses are 2+ and equal bilaterally. Neuro: Alert and oriented X 3. No facial asymmetry. No focal deficit. Moves all extremities spontaneously. Psych:  Responds to questions appropriately with a normal affect.   EKG:  The EKG was personally reviewed and demonstrates: 3/6 - NSR, 91 bpm, 1st degree AV block, rare PVC, no acute st/t changes.  Repeat EKG 3/7 - showed NSR, 71 bpm, first-degree AV block, no acute ST-T changes Telemetry:  Telemetry was personally reviewed and demonstrates: Sinus rhythm with occasional PVCs, rare ventricular couplet, brief episode of ventricular bigeminy, 4 beat run of SVT followed by a 19 beat run of SVT  Weights: Filed Weights   04/02/19 1153 04/05/19 0330  Weight: 81.6 kg 77.6 kg    Relevant CV Studies: 2D echo 04/05/2019: 1. Left ventricular ejection fraction, by estimation, is 55 to 60%. The  left ventricle has normal function. The left ventricle has no regional  wall motion abnormalities. Left ventricular diastolic parameters were  normal.  2. Right ventricular systolic function is normal. The right ventricular  size is normal. Tricuspid regurgitation signal is inadequate for assessing  PA pressure.  3. The mitral valve is grossly normal. No evidence of mitral valve  regurgitation. No evidence of mitral stenosis.  4. The aortic valve is tricuspid. Aortic valve regurgitation is not  visualized. No aortic stenosis is present.  5. The inferior vena cava is normal in size with greater than 50%  respiratory  variability, suggesting right atrial pressure of 3 mmHg.  Laboratory Data:  Chemistry Recent Labs  Lab 04/02/19 1157 04/03/19 0515 04/04/19 1308  NA 136 139 139  K 2.8* 4.2 3.8  CL 100 104 106  CO2 26 25 25   GLUCOSE 118* 134* 150*  BUN <5* 6* 15  CREATININE 0.80 0.85 0.93  CALCIUM 8.5* 8.7* 8.9  GFRNONAA >60 >60 >60  GFRAA >60 >60 >  60  ANIONGAP 10 10 8     Recent Labs  Lab 04/02/19 1157  PROT 6.9  ALBUMIN 4.1  AST 15  ALT 11  ALKPHOS 70  BILITOT 0.8   Hematology Recent Labs  Lab 04/02/19 1157 04/02/19 1605 04/03/19 0515  WBC 10.1 10.3 12.5*  RBC 4.74 4.75 4.60  HGB 14.0 13.8 13.6  HCT 40.3 40.6 39.2  MCV 85.0 85.5 85.2  MCH 29.5 29.1 29.6  MCHC 34.7 34.0 34.7  RDW 12.7 12.8 12.9  PLT 226 235 237   Cardiac EnzymesNo results for input(s): TROPONINI in the last 168 hours. No results for input(s): TROPIPOC in the last 168 hours.  BNP Recent Labs  Lab 04/02/19 1157  BNP 132.0*    DDimer No results for input(s): DDIMER in the last 168 hours.  Radiology/Studies:  DG Chest Portable 1 View  Result Date: 04/02/2019 IMPRESSION: Mild-to-moderate changes of bronchitis and/or asthma without focal airspace pneumonia. Electronically Signed   By: 06/02/2019 M.D.   On: 04/02/2019 12:38    Assessment and Plan:   1.  Paroxysmal SVT/PVCs: -Patient noted to have one 4 beat run of SVT as well as 119 beat run of SVT with occasional isolated PVCs, rare ventricular couplet, and an episode of ventricular bigeminy -He is asymptomatic -Ectopy likely in the setting of steroid and inhaler use -Consider transitioning to Xopenex -Taper steroids as able per internal medicine -Echo demonstrated preserved LV systolic function -Check TSH -Magnesium at goal -Replete potassium to goal 4.0 -No further interventions needed at this time -Patient can follow-up with cardiology as needed if he becomes symptomatic with palpitations    For questions or updates, please contact  [facility] Please consult [email or web address] for contact info under Cardiology/STEMI.   Signed, 06/02/2019, PA-C [facility] Pager: [phone] 04/05/2019, 12:28 PM

## 2019-04-05 NOTE — Discharge Summary (Signed)
Physician Discharge Summary  Adrian DinningRobert Gillespie WUJ:[id number] DOB: 09/16/1950 DOA: 04/02/2019  PCP: Adrian ShadMorrison, [name], MD  Admit date: 04/02/2019 Discharge date: 04/05/2019  Admitted From: Home Disposition: Home  Recommendations for Outpatient Follow-up:  1. Follow up with PCP on 3/18.  Patient being discharged on oral prednisone taper over the next 12 days.  Complete 5-day course of antibiotic on 3/11.  Home Health: None Equipment/Devices: None  Discharge Condition: Fair CODE STATUS: Full code Diet recommendation: Regular    Discharge Diagnoses:  Principal Problem:   COPD with acute bronchitis (HCC)  Active Problems:   Acute on chronic respiratory failure with hypoxia (HCC)   Hypokalemia   Schizophrenia (HCC)   NSVT (nonsustained ventricular tachycardia) (HCC)  Brief narrative/HPI 69 year old male with history of COPD, hypertension and schizophrenia presented with increasing shortness of breath for past few days.  When EMS arrived he was found to be in respiratory distress and placed on a nonrebreather mask. Patient reports that he recently completed 1 week course of doxycycline for his symptoms without any improvement.  Reports having greenish phlegm associated with cough. In the ED he was hypoxic with O2 sat 87-88% on room air, improved with 2 L supplemental oxygen.  Given IV Solu-Medrol and nebs and admitted to hospital service.  Patient tested negative for COVID-19.  Hospital course   Principal Problem:   COPD with acute bronchitis (HCC) Acute respiratory failure with hypoxia (HCC) Symptoms improved with IV Solu-Medrol and scheduled nebs.  Placed on Z-Pak and antitussives. Reports quitting smoking 1 month back and encouraged to quit. Patient now clinically improved and maintaining sats on room air.  Will discharge him on oral prednisone taper over the next 10-12 days, complete course of Z-Pak and prescribed antitussives. Patient uses albuterol and Atrovent inhaler which is  prescribed (he is not sure if he has enough at home).  Patient has a PCP in the community and has an appointment next week.   Active Problems: PVCs and NSVT's Noted for several runs of PVCs and NSVT's on the monitor.  Low potassium and magnesium on presentation was replenished.  2D echo with normal EF and no wall motion abnormality.  QTc normal.  TSH normal.  Cardiology consult appreciated patient asymptomatic and symptoms likely due to IV steroid and albuterol neb.  No further intervention needed.    Hypokalemia Replenished    Schizophrenia (HCC) Continue Haldol and Cogentin.  QTC stable.  Patient stable to be discharged home with outpatient follow-up.  Family Communication  : None  Disposition Plan  : Home  Consults  :  Cardiology  Procedures  : 2D echo  Discharge Instructions   Allergies as of 04/05/2019   No Known Allergies     Medication List    TAKE these medications   albuterol 108 (90 Base) MCG/ACT inhaler Commonly known as: VENTOLIN HFA Inhale 2 puffs into the lungs every 6 (six) hours as needed for shortness of breath or wheezing.   aspirin EC 81 MG tablet Take 81 mg by mouth daily.   azithromycin 250 MG tablet Commonly known as: Zithromax Take 1 tablet (250 mg total) by mouth daily for 2 days.   benztropine 1 MG tablet Commonly known as: COGENTIN Take 1 mg by mouth daily.   guaiFENesin-dextromethorphan 100-10 MG/5ML syrup Commonly known as: ROBITUSSIN DM Take 5 mLs by mouth every 4 (four) hours as needed for cough.   haloperidol 10 MG tablet Commonly known as: HALDOL Take 10 mg by mouth 2 (two) times daily.  hydrOXYzine 50 MG tablet Commonly known as: ATARAX/VISTARIL Take 50 mg by mouth daily as needed for anxiety or itching.   omeprazole 20 MG capsule Commonly known as: PRILOSEC Take 20 mg by mouth daily.   predniSONE 20 MG tablet Commonly known as: DELTASONE Take 1 tablet (20 mg total) by mouth daily with breakfast.   Spiriva  HandiHaler 18 MCG inhalation capsule Generic drug: tiotropium Place 1 capsule (18 mcg total) into inhaler and inhale daily.   traZODone 100 MG tablet Commonly known as: DESYREL Take 300 mg by mouth at bedtime.      Follow-up Information    Adrian Shad, MD Follow up.   Specialty: Internal Medicine Why: appt schedule for March 18 with Dr Deon Pilling .  Call to verify time scheduled. Contact information: [address] [place] [address] [phone]          No Known Allergies      Procedures/Studies: DG Chest Portable 1 View  Result Date: 04/02/2019 CLINICAL DATA:  69 year old who was diagnosed with pneumonia 2 weeks ago in has completed antibiotic therapy, presenting with persistent cough. Current smoker. EXAM: PORTABLE CHEST 1 VIEW COMPARISON:  None. FINDINGS: Cardiac silhouette normal in size. Mild to moderate central peribronchial thickening. Lungs otherwise clear. No localized airspace consolidation. No pleural effusions. No pneumothorax. Normal pulmonary vascularity. IMPRESSION: Mild-to-moderate changes of bronchitis and/or asthma without focal airspace pneumonia. Electronically Signed   By: Hulan Saas M.D.   On: 04/02/2019 12:38   ECHOCARDIOGRAM COMPLETE  Result Date: 04/05/2019    ECHOCARDIOGRAM REPORT   Patient Name:   Adrian Gillespie Date of Exam: 04/05/2019 Medical Rec #:  [id number]     Height:       69.0 in Accession #:    [id number]    Weight:       171.0 lb Date of Birth:  1950-07-22      BSA:          1.933 m Patient Age:    68 years      BP:           155/99 mmHg Patient Gender: M             HR:           64 bpm. Exam Location:  [facility] Procedure: 2D Echo, Color Doppler, Cardiac Doppler and Intracardiac            Opacification Agent Indications:     R94.31 Abnormal ECG  History:         Patient has no prior history of Echocardiogram examinations.                  Risk Factors:Former Smoker.  Sonographer:     Humphrey Rolls RDCS (AE) Referring Phys:  [id number]  Adrian Gillespie Diagnosing Phys: Adrian Deer End MD  Sonographer Comments: Suboptimal apical window and suboptimal subcostal window. IMPRESSIONS  1. Left ventricular ejection fraction, by estimation, is 55 to 60%. The left ventricle has normal function. The left ventricle has no regional wall motion abnormalities. Left ventricular diastolic parameters were normal.  2. Right ventricular systolic function is normal. The right ventricular size is normal. Tricuspid regurgitation signal is inadequate for assessing PA pressure.  3. The mitral valve is grossly normal. No evidence of mitral valve regurgitation. No evidence of mitral stenosis.  4. The aortic valve is tricuspid. Aortic valve regurgitation is not visualized. No aortic stenosis is present.  5. The inferior vena cava is normal in size with greater  than 50% respiratory variability, suggesting right atrial pressure of 3 mmHg. FINDINGS  Left Ventricle: Left ventricular ejection fraction, by estimation, is 55 to 60%. The left ventricle has normal function. The left ventricle has no regional wall motion abnormalities. Definity contrast agent was given IV to delineate the left ventricular  endocardial borders. The left ventricular internal cavity size was normal in size. There is no left ventricular hypertrophy. Left ventricular diastolic parameters were normal. Right Ventricle: The right ventricular size is normal. No increase in right ventricular wall thickness. Right ventricular systolic function is normal. Tricuspid regurgitation signal is inadequate for assessing PA pressure. Left Atrium: Left atrial size was normal in size. Right Atrium: Right atrial size was normal in size. Pericardium: There is no evidence of pericardial effusion. Mitral Valve: The mitral valve is grossly normal. No evidence of mitral valve regurgitation. No evidence of mitral valve stenosis. MV peak gradient, 1.6 mmHg. The mean mitral valve gradient is 1.0 mmHg. Tricuspid Valve: The tricuspid  valve is not well visualized. Tricuspid valve regurgitation is not demonstrated. Aortic Valve: The aortic valve is tricuspid. . There is mild thickening of the aortic valve. Aortic valve regurgitation is not visualized. No aortic stenosis is present. There is mild thickening of the aortic valve. Aortic valve mean gradient measures 5.0 mmHg. Aortic valve peak gradient measures 8.3 mmHg. Aortic valve area, by VTI measures 1.31 cm. Pulmonic Valve: The pulmonic valve was not well visualized. Pulmonic valve regurgitation is not visualized. No evidence of pulmonic stenosis. Aorta: The aortic root is normal in size and structure. Pulmonary Artery: The pulmonary artery is not well seen. Venous: The inferior vena cava is normal in size with greater than 50% respiratory variability, suggesting right atrial pressure of 3 mmHg. IAS/Shunts: The interatrial septum was not well visualized.  LEFT VENTRICLE PLAX 2D LVIDd:         4.40 cm  Diastology LVIDs:         2.88 cm  LV e' medial:   7.94 cm/s LV PW:         1.04 cm  LV E/e' medial: 8.1 LV IVS:        0.75 cm LVOT diam:     2.10 cm LV SV:         42 LV SV Index:   22 LVOT Area:     3.46 cm  LEFT ATRIUM         Index      RIGHT ATRIUM           Index LA diam:    2.80 cm 1.45 cm/m RA Area:     14.30 cm 7.40 cm/m  AORTIC VALVE                    PULMONIC VALVE AV Area (Vmax):    1.47 cm     PV Vmax:       1.09 m/s AV Area (Vmean):   1.26 cm     PV Vmean:      68.500 cm/s AV Area (VTI):     1.31 cm     PV VTI:        0.185 m AV Vmax:           144.00 cm/s  PV Peak grad:  4.8 mmHg AV Vmean:          108.000 cm/s PV Mean grad:  2.0 mmHg AV VTI:            0.321 m AV  Peak Grad:      8.3 mmHg AV Mean Grad:      5.0 mmHg LVOT Vmax:         61.30 cm/s LVOT Vmean:        39.300 cm/s LVOT VTI:          0.121 m LVOT/AV VTI ratio: 0.38  AORTA Ao Root diam: 3.30 cm MITRAL VALVE MV Area (PHT): 2.66 cm    SHUNTS MV Peak grad:  1.6 mmHg    Systemic VTI:  0.12 m MV Mean grad:  1.0 mmHg     Systemic Diam: 2.10 cm MV Vmax:       0.64 m/s MV Vmean:      38.8 cm/s MV Decel Time: 285 msec MV E velocity: 64.50 cm/s MV A velocity: 34.00 cm/s MV E/A ratio:  1.90 Adrian Deer End MD Electronically signed by Yvonne Kendall MD Signature Date/Time: 04/05/2019/12:29:20 PM    Final      Subjective: Breathing and cough much better today.  Still having PVCs on the monitor.  Discharge Exam: Vitals:   04/05/19 1150 04/05/19 1351  BP: (!) 143/91   Pulse: 63 64  Resp: 17 18  Temp: 98.3 F (36.8 C)   SpO2: 94% 90%   Vitals:   04/05/19 1052 04/05/19 1104 04/05/19 1150 04/05/19 1351  BP:   (!) 143/91   Pulse: 67  63 64  Resp:   17 18  Temp:   98.3 F (36.8 C)   TempSrc:   Oral   SpO2: 94% 94% 94% 90%  Weight:      Height:        General: Elderly male not in distress HEENT: Moist mucosa, supple neck Chest: Clear bilaterally, no added sounds CVS: Normal S1-S2, no murmurs rubs or gallop GI: Soft, nondistended, nontender Musculoskeletal: Warm, no edema    The results of significant diagnostics from this hospitalization (including imaging, microbiology, ancillary and laboratory) are listed below for reference.     Microbiology: Recent Results (from the past 240 hour(s))  SARS CORONAVIRUS 2 (TAT 6-24 HRS) Nasopharyngeal Nasopharyngeal Swab     Status: None   Collection Time: 04/02/19  2:48 PM   Specimen: Nasopharyngeal Swab  Result Value Ref Range Status   SARS Coronavirus 2 NEGATIVE NEGATIVE Final    Comment: (NOTE) SARS-CoV-2 target nucleic acids are NOT DETECTED. The SARS-CoV-2 RNA is generally detectable in upper and lower respiratory specimens during the acute phase of infection. Negative results do not preclude SARS-CoV-2 infection, do not rule out co-infections with other pathogens, and should not be used as the sole basis for treatment or other patient management decisions. Negative results must be combined with clinical observations, patient history, and  epidemiological information. The expected result is Negative. Fact Sheet for Patients: HairSlick.no Fact Sheet for Healthcare Providers: quierodirigir.com This test is not yet approved or cleared by the [place] FDA and  has been authorized for detection and/or diagnosis of SARS-CoV-2 by FDA under an Emergency Use Authorization (EUA). This EUA will remain  in effect (meaning this test can be used) for the duration of the COVID-19 declaration under Section 56 4(b)(1) of the Act, 21 U.S.C. section 360bbb-3(b)(1), unless the authorization is terminated or revoked sooner. Performed at [facility], [address], [place], [place] [address]      Labs: BNP (last 3 results) Recent Labs    04/02/19 1157  BNP 132.0*   Basic Metabolic Panel: Recent Labs  Lab 04/02/19 1157 04/03/19 0515  04/04/19 1308  NA 136 139 139  K 2.8* 4.2 3.8  CL 100 104 106  CO2 26 25 25   GLUCOSE 118* 134* 150*  BUN <5* 6* 15  CREATININE 0.80 0.85 0.93  CALCIUM 8.5* 8.7* 8.9  MG  --  2.3 2.2   Liver Function Tests: Recent Labs  Lab 04/02/19 1157  AST 15  ALT 11  ALKPHOS 70  BILITOT 0.8  PROT 6.9  ALBUMIN 4.1   No results for input(s): LIPASE, AMYLASE in the last 168 hours. No results for input(s): AMMONIA in the last 168 hours. CBC: Recent Labs  Lab 04/02/19 1157 04/02/19 1605 04/03/19 0515  WBC 10.1 10.3 12.5*  NEUTROABS 6.9  --   --   HGB 14.0 13.8 13.6  HCT 40.3 40.6 39.2  MCV 85.0 85.5 85.2  PLT 226 235 237   Cardiac Enzymes: No results for input(s): CKTOTAL, CKMB, CKMBINDEX, TROPONINI in the last 168 hours. BNP: Invalid input(s): POCBNP CBG: No results for input(s): GLUCAP in the last 168 hours. D-Dimer No results for input(s): DDIMER in the last 72 hours. Hgb A1c No results for input(s): HGBA1C in the last 72 hours. Lipid Profile No results for input(s): CHOL, HDL, LDLCALC, TRIG, CHOLHDL, LDLDIRECT in the  last 72 hours. Thyroid function studies No results for input(s): TSH, T4TOTAL, T3FREE, THYROIDAB in the last 72 hours.  Invalid input(s): FREET3 Anemia work up No results for input(s): VITAMINB12, FOLATE, FERRITIN, TIBC, IRON, RETICCTPCT in the last 72 hours. Urinalysis No results found for: COLORURINE, APPEARANCEUR, [place], [place], [place], [place], [place], [place], PROTEINUR, UROBILINOGEN, NITRITE, LEUKOCYTESUR Sepsis Labs Invalid input(s): PROCALCITONIN,  WBC,  LACTICIDVEN Microbiology Recent Results (from the past 240 hour(s))  SARS CORONAVIRUS 2 (TAT 6-24 HRS) Nasopharyngeal Nasopharyngeal Swab     Status: None   Collection Time: 04/02/19  2:48 PM   Specimen: Nasopharyngeal Swab  Result Value Ref Range Status   SARS Coronavirus 2 NEGATIVE NEGATIVE Final    Comment: (NOTE) SARS-CoV-2 target nucleic acids are NOT DETECTED. The SARS-CoV-2 RNA is generally detectable in upper and lower respiratory specimens during the acute phase of infection. Negative results do not preclude SARS-CoV-2 infection, do not rule out co-infections with other pathogens, and should not be used as the sole basis for treatment or other patient management decisions. Negative results must be combined with clinical observations, patient history, and epidemiological information. The expected result is Negative. Fact Sheet for Patients: SugarRoll.be Fact Sheet for Healthcare Providers: https://www.woods-mathews.com/ This test is not yet approved or cleared by the [place] FDA and  has been authorized for detection and/or diagnosis of SARS-CoV-2 by FDA under an Emergency Use Authorization (EUA). This EUA will remain  in effect (meaning this test can be used) for the duration of the COVID-19 declaration under Section 56 4(b)(1) of the Act, 21 U.S.C. section 360bbb-3(b)(1), unless the authorization is terminated or revoked sooner. Performed at [facility], [place] [address] [place], [place] [address]      Time coordinating discharge: 35 minutes  SIGNED:   Louellen Molder, MD  [facility] 04/05/2019, 3:11 PM Pager   If 7PM-7AM, please contact night-coverage [email or web address] Password [email or web address]

## 2019-04-05 NOTE — Care Management Important Message (Signed)
Important Message  Patient Details  Name: Adrian Gillespie MRN: [id number] Date of Birth: 1950/05/25   Medicare Important Message Given:  Yes     [name] 04/05/2019, 11:08 AM

## 2019-04-05 NOTE — Discharge Instructions (Signed)
Chronic Obstructive Pulmonary Disease Chronic obstructive pulmonary disease (COPD) is a long-term (chronic) lung problem. When you have COPD, it is hard for air to get in and out of your lungs. Usually the condition gets worse over time, and your lungs will never return to normal. There are things you can do to keep yourself as healthy as possible.  Your doctor may treat your condition with: ? Medicines. ? Oxygen. ? Lung surgery.  Your doctor may also recommend: ? Rehabilitation. This includes steps to make your body work better. It may involve a team of specialists. ? Quitting smoking, if you smoke. ? Exercise and changes to your diet. ? Comfort measures (palliative care). Follow these instructions at home: Medicines  Take over-the-counter and prescription medicines only as told by your doctor.  Talk to your doctor before taking any cough or allergy medicines. You may need to avoid medicines that cause your lungs to be dry. Lifestyle  If you smoke, stop. Smoking makes the problem worse. If you need help quitting, ask your doctor.  Avoid being around things that make your breathing worse. This may include smoke, chemicals, and fumes.  Stay active, but remember to rest as well.  Learn and use tips on how to relax.  Make sure you get enough sleep. Most adults need at least 7 hours of sleep every night.  Eat healthy foods. Eat smaller meals more often. Rest before meals. Controlled breathing Learn and use tips on how to control your breathing as told by your doctor. Try:  Breathing in (inhaling) through your nose for 1 second. Then, pucker your lips and breath out (exhale) through your lips for 2 seconds.  Putting one hand on your belly (abdomen). Breathe in slowly through your nose for 1 second. Your hand on your belly should move out. Pucker your lips and breathe out slowly through your lips. Your hand on your belly should move in as you breathe out.  Controlled coughing Learn  and use controlled coughing to clear mucus from your lungs. Follow these steps: 1. Lean your head a little forward. 2. Breathe in deeply. 3. Try to hold your breath for 3 seconds. 4. Keep your mouth slightly open while coughing 2 times. 5. Spit any mucus out into a tissue. 6. Rest and do the steps again 1 or 2 times as needed. General instructions  Make sure you get all the shots (vaccines) that your doctor recommends. Ask your doctor about a flu shot and a pneumonia shot.  Use oxygen therapy and pulmonary rehabilitation if told by your doctor. If you need home oxygen therapy, ask your doctor if you should buy a tool to measure your oxygen level (oximeter).  Make a COPD action plan with your doctor. This helps you to know what to do if you feel worse than usual.  Manage any other conditions you have as told by your doctor.  Avoid going outside when it is very hot, cold, or humid.  Avoid people who have a sickness you can catch (contagious).  Keep all follow-up visits as told by your doctor. This is important. Contact a doctor if:  You cough up more mucus than usual.  There is a change in the color or thickness of the mucus.  It is harder to breathe than usual.  Your breathing is faster than usual.  You have trouble sleeping.  You need to use your medicines more often than usual.  You have trouble doing your normal activities such as getting dressed   or walking around the house. Get help right away if:  You have shortness of breath while resting.  You have shortness of breath that stops you from: ? Being able to talk. ? Doing normal activities.  Your chest hurts for longer than 5 minutes.  Your skin color is more blue than usual.  Your pulse oximeter shows that you have low oxygen for longer than 5 minutes.  You have a fever.  You feel too tired to breathe normally. Summary  Chronic obstructive pulmonary disease (COPD) is a long-term lung problem.  The way your  lungs work will never return to normal. Usually the condition gets worse over time. There are things you can do to keep yourself as healthy as possible.  Take over-the-counter and prescription medicines only as told by your doctor.  If you smoke, stop. Smoking makes the problem worse. This information is not intended to replace advice given to you by your health care provider. Make sure you discuss any questions you have with your health care provider. Document Revised: 12/26/2016 Document Reviewed: 02/18/2016 Elsevier Patient Education  2020 Elsevier Inc.  

## 2019-04-20 ENCOUNTER — Inpatient Hospital Stay
Admission: EM | Admit: 2019-04-20 | Discharge: 2019-04-23 | DRG: 193 | Disposition: A | Discharge status: Home / Self Care | Payer: Medicare Other | Attending: Internal Medicine | Admitting: Internal Medicine

## 2019-04-20 ENCOUNTER — Emergency Department: Payer: Medicare Other

## 2019-04-20 ENCOUNTER — Encounter: Payer: Self-pay | Admitting: Emergency Medicine

## 2019-04-20 ENCOUNTER — Other Ambulatory Visit: Payer: Self-pay

## 2019-04-20 DIAGNOSIS — I480 Paroxysmal atrial fibrillation: Secondary | ICD-10-CM | POA: Diagnosis present

## 2019-04-20 DIAGNOSIS — F419 Anxiety disorder, unspecified: Secondary | ICD-10-CM | POA: Diagnosis present

## 2019-04-20 DIAGNOSIS — J189 Pneumonia, unspecified organism: Secondary | ICD-10-CM | POA: Diagnosis not present

## 2019-04-20 DIAGNOSIS — K219 Gastro-esophageal reflux disease without esophagitis: Secondary | ICD-10-CM | POA: Diagnosis present

## 2019-04-20 DIAGNOSIS — E876 Hypokalemia: Secondary | ICD-10-CM | POA: Diagnosis present

## 2019-04-20 DIAGNOSIS — Z7951 Long term (current) use of inhaled steroids: Secondary | ICD-10-CM | POA: Diagnosis not present

## 2019-04-20 DIAGNOSIS — E785 Hyperlipidemia, unspecified: Secondary | ICD-10-CM | POA: Diagnosis present

## 2019-04-20 DIAGNOSIS — J9601 Acute respiratory failure with hypoxia: Secondary | ICD-10-CM | POA: Diagnosis present

## 2019-04-20 DIAGNOSIS — B181 Chronic viral hepatitis B without delta-agent: Secondary | ICD-10-CM | POA: Diagnosis present

## 2019-04-20 DIAGNOSIS — J441 Chronic obstructive pulmonary disease with (acute) exacerbation: Secondary | ICD-10-CM | POA: Diagnosis present

## 2019-04-20 DIAGNOSIS — I10 Essential (primary) hypertension: Secondary | ICD-10-CM | POA: Diagnosis present

## 2019-04-20 DIAGNOSIS — Z7982 Long term (current) use of aspirin: Secondary | ICD-10-CM | POA: Diagnosis not present

## 2019-04-20 DIAGNOSIS — I493 Ventricular premature depolarization: Secondary | ICD-10-CM | POA: Diagnosis present

## 2019-04-20 DIAGNOSIS — I472 Ventricular tachycardia: Secondary | ICD-10-CM

## 2019-04-20 DIAGNOSIS — A419 Sepsis, unspecified organism: Secondary | ICD-10-CM

## 2019-04-20 DIAGNOSIS — F209 Schizophrenia, unspecified: Secondary | ICD-10-CM | POA: Diagnosis not present

## 2019-04-20 DIAGNOSIS — J449 Chronic obstructive pulmonary disease, unspecified: Secondary | ICD-10-CM | POA: Diagnosis present

## 2019-04-20 DIAGNOSIS — Z20822 Contact with and (suspected) exposure to covid-19: Secondary | ICD-10-CM | POA: Diagnosis present

## 2019-04-20 DIAGNOSIS — F17210 Nicotine dependence, cigarettes, uncomplicated: Secondary | ICD-10-CM | POA: Diagnosis present

## 2019-04-20 DIAGNOSIS — J440 Chronic obstructive pulmonary disease with acute lower respiratory infection: Secondary | ICD-10-CM | POA: Diagnosis present

## 2019-04-20 DIAGNOSIS — Z79899 Other long term (current) drug therapy: Secondary | ICD-10-CM | POA: Diagnosis not present

## 2019-04-20 DIAGNOSIS — J9621 Acute and chronic respiratory failure with hypoxia: Secondary | ICD-10-CM | POA: Diagnosis not present

## 2019-04-20 DIAGNOSIS — R0602 Shortness of breath: Secondary | ICD-10-CM | POA: Diagnosis not present

## 2019-04-20 DIAGNOSIS — I4729 Other ventricular tachycardia: Secondary | ICD-10-CM

## 2019-04-20 LAB — BLOOD GAS, VENOUS
Acid-Base Excess: 2.5 mmol/L — ABNORMAL HIGH (ref 0.0–2.0)
Bicarbonate: 28.4 mmol/L — ABNORMAL HIGH (ref 20.0–28.0)
O2 Saturation: 83.4 %
Patient temperature: 37
pCO2, Ven: 48 mmHg (ref 44.0–60.0)
pH, Ven: 7.38 (ref 7.250–7.430)
pO2, Ven: 49 mmHg — ABNORMAL HIGH (ref 32.0–45.0)

## 2019-04-20 LAB — RESPIRATORY PANEL BY RT PCR (FLU A&B, COVID)
Influenza A by PCR: NEGATIVE
Influenza B by PCR: NEGATIVE
SARS Coronavirus 2 by RT PCR: NEGATIVE

## 2019-04-20 LAB — PROTIME-INR
INR: 1 (ref 0.8–1.2)
Prothrombin Time: 12.8 seconds (ref 11.4–15.2)

## 2019-04-20 LAB — COMPREHENSIVE METABOLIC PANEL
ALT: 19 U/L (ref 0–44)
AST: 18 U/L (ref 15–41)
Albumin: 3.9 g/dL (ref 3.5–5.0)
Alkaline Phosphatase: 63 U/L (ref 38–126)
Anion gap: 10 (ref 5–15)
BUN: 8 mg/dL (ref 8–23)
CO2: 24 mmol/L (ref 22–32)
Calcium: 8.8 mg/dL — ABNORMAL LOW (ref 8.9–10.3)
Chloride: 106 mmol/L (ref 98–111)
Creatinine, Ser: 0.91 mg/dL (ref 0.61–1.24)
GFR calc Af Amer: >60 mL/min (ref >60)
GFR calc non Af Amer: >60 mL/min (ref >60)
Glucose, Bld: 122 mg/dL — ABNORMAL HIGH (ref 70–99)
Potassium: 3.4 mmol/L — ABNORMAL LOW (ref 3.5–5.1)
Sodium: 140 mmol/L (ref 135–145)
Total Bilirubin: 0.6 mg/dL (ref 0.3–1.2)
Total Protein: 6.7 g/dL (ref 6.5–8.1)

## 2019-04-20 LAB — CBC WITH DIFFERENTIAL/PLATELET
Abs Immature Granulocytes: 0.08 10*3/uL — ABNORMAL HIGH (ref 0.00–0.07)
Basophils Absolute: 0.1 10*3/uL (ref 0.0–0.1)
Basophils Relative: 0 %
Eosinophils Absolute: 0.5 10*3/uL (ref 0.0–0.5)
Eosinophils Relative: 3 %
HCT: 44.7 % (ref 39.0–52.0)
Hemoglobin: 14.8 g/dL (ref 13.0–17.0)
Immature Granulocytes: 1 %
Lymphocytes Relative: 16 %
Lymphs Abs: 2.2 10*3/uL (ref 0.7–4.0)
MCH: 29.1 pg (ref 26.0–34.0)
MCHC: 33.1 g/dL (ref 30.0–36.0)
MCV: 87.8 fL (ref 80.0–100.0)
Monocytes Absolute: 1.2 10*3/uL — ABNORMAL HIGH (ref 0.1–1.0)
Monocytes Relative: 9 %
Neutro Abs: 9.8 10*3/uL — ABNORMAL HIGH (ref 1.7–7.7)
Neutrophils Relative %: 71 %
Platelets: 136 10*3/uL — ABNORMAL LOW (ref 150–400)
RBC: 5.09 MIL/uL (ref 4.22–5.81)
RDW: 13.8 % (ref 11.5–15.5)
WBC: 13.9 10*3/uL — ABNORMAL HIGH (ref 4.0–10.5)
nRBC: 0 % (ref 0.0–0.2)

## 2019-04-20 LAB — URINALYSIS, ROUTINE W REFLEX MICROSCOPIC
Bacteria, UA: NONE SEEN
Bilirubin Urine: NEGATIVE
Glucose, UA: NEGATIVE mg/dL
Ketones, ur: NEGATIVE mg/dL
Leukocytes,Ua: NEGATIVE
Nitrite: NEGATIVE
Protein, ur: NEGATIVE mg/dL
Specific Gravity, Urine: 1.009 (ref 1.005–1.030)
Squamous Epithelial / HPF: NONE SEEN (ref 0–5)
pH: 5 (ref 5.0–8.0)

## 2019-04-20 LAB — APTT: aPTT: 34 seconds (ref 24–36)

## 2019-04-20 LAB — EXPECTORATED SPUTUM ASSESSMENT W GRAM STAIN, RFLX TO RESP C

## 2019-04-20 LAB — HIV ANTIBODY (ROUTINE TESTING W REFLEX): HIV Screen 4th Generation wRfx: NONREACTIVE

## 2019-04-20 LAB — MAGNESIUM: Magnesium: 2.1 mg/dL (ref 1.7–2.4)

## 2019-04-20 LAB — STREP PNEUMONIAE URINARY ANTIGEN: Strep Pneumo Urinary Antigen: NEGATIVE

## 2019-04-20 LAB — TROPONIN I (HIGH SENSITIVITY): Troponin I (High Sensitivity): 5 ng/L (ref <18)

## 2019-04-20 LAB — LACTIC ACID, PLASMA: Lactic Acid, Venous: 1.3 mmol/L (ref 0.5–1.9)

## 2019-04-20 MED ORDER — ACETAMINOPHEN 325 MG PO TABS
650.0000 mg | ORAL_TABLET | Freq: Four times a day (QID) | ORAL | Status: DC | PRN
  Filled 2019-04-20: qty 2

## 2019-04-20 MED ORDER — TRAZODONE HCL 100 MG PO TABS
300.0000 mg | ORAL_TABLET | Freq: Every day | ORAL | Status: DC
  Administered 2019-04-20 – 2019-04-22 (×3): 300 mg via ORAL
  Filled 2019-04-20 (×3): qty 3

## 2019-04-20 MED ORDER — ALBUTEROL SULFATE (2.5 MG/3ML) 0.083% IN NEBU: 2.5000 mg | INHALATION_SOLUTION | RESPIRATORY_TRACT | Status: DC | PRN

## 2019-04-20 MED ORDER — SODIUM CHLORIDE 0.9 % IV SOLN
100.0000 mg | Freq: Two times a day (BID) | INTRAVENOUS | Status: DC
  Administered 2019-04-20 – 2019-04-21 (×3): 100 mg via INTRAVENOUS
  Filled 2019-04-20 (×6): qty 100

## 2019-04-20 MED ORDER — HYDROXYZINE PAMOATE 50 MG PO CAPS
50.0000 mg | ORAL_CAPSULE | Freq: Two times a day (BID) | ORAL | Status: DC | PRN
  Filled 2019-04-20: qty 1

## 2019-04-20 MED ORDER — DM-GUAIFENESIN ER 30-600 MG PO TB12
1.0000 | ORAL_TABLET | Freq: Two times a day (BID) | ORAL | Status: DC
  Administered 2019-04-20 – 2019-04-23 (×7): 1 via ORAL
  Filled 2019-04-20 (×7): qty 1

## 2019-04-20 MED ORDER — ENOXAPARIN SODIUM 40 MG/0.4ML ~~LOC~~ SOLN
40.0000 mg | SUBCUTANEOUS | Status: DC
  Administered 2019-04-20 – 2019-04-21 (×2): 40 mg via SUBCUTANEOUS
  Filled 2019-04-20 (×2): qty 0.4

## 2019-04-20 MED ORDER — HALOPERIDOL 5 MG PO TABS
10.0000 mg | ORAL_TABLET | Freq: Two times a day (BID) | ORAL | Status: DC
  Administered 2019-04-20 – 2019-04-23 (×6): 10 mg via ORAL
  Filled 2019-04-20 (×7): qty 2

## 2019-04-20 MED ORDER — IPRATROPIUM-ALBUTEROL 0.5-2.5 (3) MG/3ML IN SOLN: 3.0000 mL | Freq: Once | RESPIRATORY_TRACT | Status: AC

## 2019-04-20 MED ORDER — SODIUM CHLORIDE 0.9 % IV SOLN
2.0000 g | INTRAVENOUS | Status: DC
  Administered 2019-04-20 – 2019-04-22 (×3): 2 g via INTRAVENOUS
  Filled 2019-04-20 (×5): qty 20

## 2019-04-20 MED ORDER — IPRATROPIUM-ALBUTEROL 0.5-2.5 (3) MG/3ML IN SOLN
3.0000 mL | Freq: Once | RESPIRATORY_TRACT | Status: AC
  Administered 2019-04-20: 3 mL via RESPIRATORY_TRACT

## 2019-04-20 MED ORDER — BENZTROPINE MESYLATE 1 MG PO TABS
1.0000 mg | ORAL_TABLET | Freq: Every day | ORAL | Status: DC
  Administered 2019-04-21 – 2019-04-23 (×3): 1 mg via ORAL
  Filled 2019-04-20 (×3): qty 1

## 2019-04-20 MED ORDER — IPRATROPIUM-ALBUTEROL 0.5-2.5 (3) MG/3ML IN SOLN
3.0000 mL | RESPIRATORY_TRACT | Status: DC
  Administered 2019-04-20 – 2019-04-21 (×5): 3 mL via RESPIRATORY_TRACT
  Filled 2019-04-20 (×5): qty 3

## 2019-04-20 MED ORDER — ASPIRIN EC 81 MG PO TBEC
81.0000 mg | DELAYED_RELEASE_TABLET | Freq: Every day | ORAL | Status: DC
  Administered 2019-04-20 – 2019-04-21 (×2): 81 mg via ORAL
  Filled 2019-04-20 (×2): qty 1

## 2019-04-20 MED ORDER — SODIUM CHLORIDE 0.9 % IV SOLN: 500.0000 mg | INTRAVENOUS | Status: DC

## 2019-04-20 MED ORDER — METHYLPREDNISOLONE SODIUM SUCC 40 MG IJ SOLR
40.0000 mg | Freq: Two times a day (BID) | INTRAMUSCULAR | Status: DC
  Administered 2019-04-20 – 2019-04-22 (×5): 40 mg via INTRAVENOUS
  Filled 2019-04-20 (×5): qty 1

## 2019-04-20 MED ORDER — PANTOPRAZOLE SODIUM 40 MG PO TBEC
40.0000 mg | DELAYED_RELEASE_TABLET | Freq: Every day | ORAL | Status: DC
  Administered 2019-04-20 – 2019-04-23 (×4): 40 mg via ORAL
  Filled 2019-04-20 (×4): qty 1

## 2019-04-20 MED ORDER — METHYLPREDNISOLONE SODIUM SUCC 125 MG IJ SOLR
125.0000 mg | Freq: Once | INTRAMUSCULAR | Status: AC
  Administered 2019-04-20: 125 mg via INTRAVENOUS
  Filled 2019-04-20: qty 2

## 2019-04-20 MED ORDER — IPRATROPIUM-ALBUTEROL 0.5-2.5 (3) MG/3ML IN SOLN
RESPIRATORY_TRACT | Status: AC
Start: 2019-04-20 — End: 2019-04-20
  Administered 2019-04-20: 3 mL via RESPIRATORY_TRACT
  Filled 2019-04-20: qty 6

## 2019-04-20 MED ORDER — POTASSIUM CHLORIDE CRYS ER 20 MEQ PO TBCR
40.0000 meq | EXTENDED_RELEASE_TABLET | Freq: Once | ORAL | Status: AC
  Administered 2019-04-20: 40 meq via ORAL
  Filled 2019-04-20: qty 2

## 2019-04-20 MED ORDER — NICOTINE 21 MG/24HR TD PT24
21.0000 mg | MEDICATED_PATCH | Freq: Every day | TRANSDERMAL | Status: DC
  Administered 2019-04-20 – 2019-04-23 (×4): 21 mg via TRANSDERMAL
  Filled 2019-04-20 (×4): qty 1

## 2019-04-20 NOTE — ED Notes (Signed)
Pt ate 100% of breakfast.

## 2019-04-20 NOTE — ED Notes (Signed)
Pt not on O2 when this RN entered the room, pt reports "the doctor took me off of it". Pt 93% on RA with no SHOB observed

## 2019-04-20 NOTE — ED Triage Notes (Signed)
Pt to triage via w/c, mask in place; pt reports SHOB and prod cough green sputum; st seen wk ago and dx pneumonia/bronchitis;completed antibiotic but symptoms persists; O2 sat 87% on ra; placed on O2 4l/min via [place] to bring sat 90%

## 2019-04-20 NOTE — H&P (Signed)
History and Physical    Adrian DinningRobert Barro ZOX:[id number] DOB: 01/17/1951 DOA: 04/20/2019  Referring MD/NP/PA:   PCP: Andres ShadMorrison, [name], MD   Patient coming from:  The patient is coming from home.  At baseline, pt is independent for most of ADL.        Chief Complaint: Cough and shortness of breath  HPI: Adrian DinningRobert Gillespie is a 69 y.o. male with medical history significant of hypertension, hyperlipidemia, COPD, asthma, GERD, depression, anxiety, schizophrenia, HBV, tobacco abuse, who presents with cough and shortness breath.  Patient was recently hospitalized from 3/6-3/9 due to COPD exacerbation.  He states that after he was discharged home, he has not fully recovered.  Pt completed a course of Z-Pak and oral prednisone taper, but continues to have shortness breath and productive cough, which has worsened in the past several days. Denies chest pain, fever or chills.  Patient does not have nausea vomiting, diarrhea, abdominal pain, symptoms of UTI or unilateral weakness. Initially pt had oxygen desaturation to 87% on room air, BiPAP was started.   ED Course: pt was found to have WBC 17.9, lactic acid 1.3, INR 1.0, PTT 34, negative Covid PCR, potassium 3.4, renal function okay, temperature 99.3, blood pressure 110/81, tachycardia.  Chest x-ray showed mild hazy opacity in the right base.  Patient is admitted to MedSurg bed as inpatient.  Review of Systems:   General: no fevers, chills, no body weight gain, has poor appetite, has fatigue HEENT: no blurry vision, hearing changes or sore throat Respiratory: has dyspnea, coughing, wheezing CV: no chest pain, no palpitations GI: no nausea, vomiting, abdominal pain, diarrhea, constipation GU: no dysuria, burning on urination, increased urinary frequency, hematuria  Ext: no leg edema Neuro: no unilateral weakness, numbness, or tingling, no vision change or hearing loss Skin: no rash, no skin tear. MSK: No muscle spasm, no deformity, no limitation of  range of movement in spin Heme: No easy bruising.  Travel history: No recent long distant travel.  Allergy: No Known Allergies  Past Medical History:  Diagnosis Date  . Anxiety   . 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 COPD (chronic obstructive pulmonary disease) (HCC)   . Depression   . GERD (gastroesophageal reflux disease)   . 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 HLD (hyperlipidemia)   . HTN (hypertension)   . Schizophrenia (HCC)     History reviewed. No pertinent surgical history.  Social History:  reports that he has been smoking cigarettes. He has been smoking about 1.00 pack per day. He has never used smokeless tobacco. No history on file for alcohol and drug.  Family History:  Family History  Problem Relation Age of Onset  . Arthritis/Rheumatoid Mother      Prior to Admission medications   Medication Sig Start Date End Date Taking? Authorizing Provider  albuterol (VENTOLIN HFA) 108 (90 Base) MCG/ACT inhaler Inhale 2 puffs into the lungs every 6 (six) hours as needed for shortness of breath or wheezing. 04/05/19  Yes Dhungel, Nishant, MD  aspirin EC 81 MG tablet Take 81 mg by mouth daily.   Yes [provider]  benztropine (COGENTIN) 1 MG tablet Take 1 mg by mouth daily.   Yes [provider]  BEVESPI AEROSPHERE 9-4.8 MCG/ACT AERO Inhale 2 puffs into the lungs at bedtime.  03/31/19  Yes [provider]  guaiFENesin-dextromethorphan (ROBITUSSIN DM) 100-10 MG/5ML syrup Take 5 mLs by mouth every 4 (four) hours as needed for cough. 04/05/19  Yes Dhungel, Nishant, MD  haloperidol (HALDOL) 10 MG tablet Take 10 mg by mouth 2 (two) times daily.   Yes [provider]  hydrOXYzine (VISTARIL) 50 MG capsule Take 50 mg by mouth 2 (two) times daily as needed for anxiety.  04/14/19  Yes [provider]  INVEGA SUSTENNA 234 MG/1.5ML SUSY injection Inject 234 mg into the muscle every 30 (thirty) days. 04/18/19  Yes [provider]  omeprazole (PRILOSEC) 20 MG capsule Take 20 mg by mouth daily.   Yes [provider]  tiotropium (SPIRIVA HANDIHALER) 18 MCG inhalation capsule Place 1 capsule (18 mcg total) into inhaler and inhale daily. 04/05/19  Yes Dhungel, Nishant, MD  traZODone (DESYREL) 100 MG tablet Take 300 mg by mouth at bedtime.   Yes [provider]    Physical Exam: Vitals:   04/20/19 1100 04/20/19 1200 04/20/19 1230 04/20/19 1300  BP: (!) 128/100 126/81 130/89 127/88  Pulse: 99 96 87 90  Resp: 20 (!) 24 (!) 29 (!) 22  Temp:      TempSrc:      SpO2: 96% 95% 93% 90%  Weight:      Height:       General: Not in acute distress HEENT:       Eyes: PERRL, EOMI, no scleral icterus.       ENT: No discharge from the ears and nose, no pharynx injection, no tonsillar enlargement.        Neck: No JVD, no bruit, no mass felt. Heme: No neck lymph node enlargement. Cardiac: S1/S2, RRR, No murmurs, No gallops or rubs. Respiratory: Has rhonchi no wheezing bilaterally GI: Soft, nondistended, nontender, no rebound pain, no organomegaly, BS present. GU: No hematuria Ext: No pitting leg edema bilaterally. 2+DP/PT pulse bilaterally. Musculoskeletal: No joint deformities, No joint redness or warmth, no limitation of ROM in spin. Skin: No rashes.  Neuro: Alert, oriented X3, cranial nerves II-XII grossly intact, moves all extremities normally.  Psych: Patient is not psychotic, no suicidal or hemocidal ideation.  Labs on Admission: I have personally reviewed following labs and imaging studies  CBC: Recent Labs  Lab 04/20/19 0227  WBC 13.9*  NEUTROABS 9.8*  HGB 14.8  HCT 44.7  MCV 87.8  PLT 136*   Basic Metabolic Panel: Recent Labs  Lab 04/20/19 0227  NA 140  K 3.4*  CL 106  CO2 24  GLUCOSE 122*  BUN 8  CREATININE 0.91  CALCIUM 8.8*  MG 2.1   GFR: Estimated Creatinine Clearance: 81.3 mL/min (by C-G formula based on SCr of 0.91 mg/dL). Liver Function Tests: Recent Labs   Lab 04/20/19 0227  AST 18  ALT 19  ALKPHOS 63  BILITOT 0.6  PROT 6.7  ALBUMIN 3.9   No results for input(s): LIPASE, AMYLASE in the last 168 hours. No results for input(s): AMMONIA in the last 168 hours. Coagulation Profile: Recent Labs  Lab 04/20/19 0237  INR 1.0   Cardiac Enzymes: No results for input(s): CKTOTAL, CKMB, CKMBINDEX, TROPONINI in the last 168 hours. BNP (last 3 results) No results for input(s): PROBNP in the last 8760 hours. HbA1C: No results for input(s): HGBA1C in the last 72 hours. CBG: No results for input(s): GLUCAP in the last 168 hours. Lipid Profile: No results for input(s): CHOL, HDL, LDLCALC, TRIG, CHOLHDL, LDLDIRECT in the last 72 hours. Thyroid Function Tests: No results for input(s): TSH, T4TOTAL, FREET4, T3FREE, THYROIDAB in the last 72 hours. Anemia Panel: No results for input(s): VITAMINB12, FOLATE, FERRITIN, TIBC,  IRON, RETICCTPCT in the last 72 hours. Urine analysis: No results found for: COLORURINE, APPEARANCEUR, LABSPEC, PHURINE, GLUCOSEU, HGBUR, BILIRUBINUR, KETONESUR, PROTEINUR, UROBILINOGEN, NITRITE, LEUKOCYTESUR Sepsis Labs: @LABRCNTIP (procalcitonin:4,lacticidven:4) ) Recent Results (from the past 240 hour(s))  Blood Culture (routine x 2)     Status: None (Preliminary result)   Collection Time: 04/20/19  2:44 AM   Specimen: BLOOD  Result Value Ref Range Status   Specimen Description BLOOD LEFT ANTECUBITAL  Final   Special Requests   Final    BOTTLES DRAWN AEROBIC AND ANAEROBIC Blood Culture results may not be optimal due to an excessive volume of blood received in culture bottles   Culture   Final    NO GROWTH < 12 HOURS Performed at [facility], [address], [place], [place]    Report Status PENDING  Incomplete  Blood Culture (routine x 2)     Status: None (Preliminary result)   Collection Time: 04/20/19  2:49 AM   Specimen: BLOOD  Result Value Ref Range Status   Specimen Description BLOOD BLOOD  RIGHT WRIST  Final   Special Requests   Final    BOTTLES DRAWN AEROBIC AND ANAEROBIC Blood Culture results may not be optimal due to an excessive volume of blood received in culture bottles   Culture   Final    NO GROWTH < 12 HOURS Performed at [facility], [address], [place], [place]    Report Status PENDING  Incomplete  Respiratory Panel by RT PCR (Flu A&B, Covid) - Nasopharyngeal Swab     Status: None   Collection Time: 04/20/19  2:49 AM   Specimen: Nasopharyngeal Swab  Result Value Ref Range Status   SARS Coronavirus 2 by RT PCR NEGATIVE NEGATIVE Final    Comment: (NOTE) SARS-CoV-2 target nucleic acids are NOT DETECTED. The SARS-CoV-2 RNA is generally detectable in upper respiratoy specimens during the acute phase of infection. The lowest concentration of SARS-CoV-2 viral copies this assay can detect is 131 copies/mL. A negative result does not preclude SARS-Cov-2 infection and should not be used as the sole basis for treatment or other patient management decisions. A negative result may occur with  improper specimen collection/handling, submission of specimen other than nasopharyngeal swab, presence of viral mutation(s) within the areas targeted by this assay, and inadequate number of viral copies (<131 copies/mL). A negative result must be combined with clinical observations, patient history, and epidemiological information. The expected result is Negative. Fact Sheet for Patients:  04/22/19 Fact Sheet for Healthcare Providers:  https://www.moore.com/ This test is not yet ap proved or cleared by the https://www.young.biz/ FDA and  has been authorized for detection and/or diagnosis of SARS-CoV-2 by FDA under an Emergency Use Authorization (EUA). This EUA will remain  in effect (meaning this test can be used) for the duration of the COVID-19 declaration under Section 564(b)(1) of the Act, 21 U.S.C. section  360bbb-3(b)(1), unless the authorization is terminated or revoked sooner.    Influenza A by PCR NEGATIVE NEGATIVE Final   Influenza B by PCR NEGATIVE NEGATIVE Final    Comment: (NOTE) The Xpert Xpress SARS-CoV-2/FLU/RSV assay is intended as an aid in  the diagnosis of influenza from Nasopharyngeal swab specimens and  should not be used as a sole basis for treatment. Nasal washings and  aspirates are unacceptable for Xpert Xpress SARS-CoV-2/FLU/RSV  testing. Fact Sheet for Patients: [place] Fact Sheet for Healthcare Providers: https://www.moore.com/ This test is not yet approved or cleared by the https://www.young.biz/  FDA and  has been authorized for detection and/or diagnosis of SARS-CoV-2 by  FDA under an Emergency Use Authorization (EUA). This EUA will remain  in effect (meaning this test can be used) for the duration of the  Covid-19 declaration under Section 564(b)(1) of the Act, 21  U.S.C. section 360bbb-3(b)(1), unless the authorization is  terminated or revoked. Performed at [facility], [address] [place], [place] [address]      Radiological Exams on Admission: DG Chest Portable 1 View  Result Date: 04/20/2019 CLINICAL DATA:  Shortness of breath EXAM: PORTABLE CHEST 1 VIEW COMPARISON:  April 02, 2019 FINDINGS: The heart size and mediastinal contours are within normal limits. Mildly hazy airspace opacity again noted at the right lung base. No large airspace consolidation or pleural effusion. No acute osseous abnormality. IMPRESSION: Mildly hazy airspace opacity at the right lung base which could be due to atelectasis and/or early infectious etiology. Electronically Signed   By: Prudencio Pair M.D.   On: 04/20/2019 02:42     EKG: Independently reviewed. Seems to have new onset A fib?, QTc 595, tachycardia, occasional PVC. The repeated EKG showed sinus rhythm, QTC 470, frequent PVC, early R wave  progression  Assessment/Plan Principal Problem:   COPD exacerbation (HCC) Active Problems:   Acute respiratory failure with hypoxia (HCC)   Hypokalemia   Schizophrenia (HCC)   NSVT (nonsustained ventricular tachycardia) (HCC)   CAP (community acquired pneumonia)   PVC (premature ventricular contraction)   GERD (gastroesophageal reflux disease)   HTN (hypertension)   Acute respiratory failure with hypoxia due to COPD exacerbation and CAP (community acquired pneumonia):   - Will admit to med-surg bed as inpt - IV Rocephin and doxycycline - Solu-Medrol 40 mg twice daily - Mucinex for cough  - Incentive spirometry - Bronchodilators - Urine legionella and S. pneumococcal antigen - Follow up blood culture x2, sputum culture  Schizophrenia Scnetx): the repeated EKG showed Qtc 470 -continue benztropine, Haldol, as needed hydroxyzine  GERD: -Protonix  Hypokalemia: K= 3.4 on admission. - Repleted - Check Mg level  HTN: not on Bp meds now. Bp 110/81 -monitoring Bp closely  PVC (premature ventricular contraction): pt had this issue in recent admission. The initial EKG seems to have A fib with RVR, but the repeated EKG showed sinus rhythm with improvement PVC. It is likely triggered by hypoxia. Dr. Humphrey Rolls of card is consulted. -Cardiac monitor -check TSH -->0.498. -f/u card's recommendation       Inpatient status:  # Patient requires inpatient status due to high intensity of service, high risk for further deterioration and high frequency of surveillance required.  I certify that at the point of admission it is my clinical judgment that the patient will require inpatient hospital care spanning beyond 2 midnights from the point of admission.  . This patient has multiple chronic comorbidities including hypertension, hyperlipidemia, COPD, asthma, GERD, depression, anxiety, schizophrenia, HBV, tobacco abuse, recent admission due to COPD exacerbation . Now patient has presenting with  COPD exacerbation and possible community-acquired pneumonia, also has PVC . The worrisome physical exam findings include rhonchi and wheezing bilaterally on lung auscultation  . the initial radiographic and laboratory data are worrisome because of mild hazy opacity in the right base on chest x-ray . Current medical needs: please see my assessment and plan . Predictability of an adverse outcome (risk): Patient has multiple comorbidities as listed above. Now presents with COPD exacerbation and possible community-acquired pneumonia, also has PVC. Patient's presentation is highly complicated.  Patient is at high risk of deteriorating.  Will need to be treated in hospital for at least 2 days.           DVT ppx: SQ Lovenox Code Status: Full code Family Communication:  Yes, patient's brother by phone at bed side Disposition Plan:  Anticipate discharge back to previous home environment Consults called:  Card, Dr. Welton Flakes   Admission status: Med-surg bed as inpt        Date of Service 04/20/2019    Lorretta Harp [facility]   If 7PM-7AM, please contact night-coverage [email or web address] 04/20/2019, 1:47 PM

## 2019-04-20 NOTE — Progress Notes (Signed)
CODE SEPSIS - PHARMACY COMMUNICATION  **Broad Spectrum Antibiotics should be administered within 1 hour of Sepsis diagnosis**  Time Code Sepsis Called/Page Received: 0226  Antibiotics Ordered: ceftriaxone/doxycycline  Time of 1st antibiotic administration: 0256  Additional action taken by pharmacy: spoken to ED MD to switch from azithro to doxy given Qtc was prolonged at 595 on EKG  If necessary, Name of Provider/Nurse Contacted:     Thomasene Ripple ,PharmD Clinical Pharmacist  04/20/2019  3:16 AM

## 2019-04-20 NOTE — ED Notes (Signed)
Pt urine, sputum and two sst tubes sent to lab with white pt labels that were labeled and timed by this RN, lab informed

## 2019-04-20 NOTE — ED Triage Notes (Signed)
FIRST NURSE NOTE:  PT arrived via POV with c/o bronchitis and pnemonia. Pt short of breath on arrival, recently admitted for COPD exacerbation.  Pt ambulatory and declined wheelchair at STAT desk.

## 2019-04-20 NOTE — ED Notes (Signed)
Pt given 2 colas per request

## 2019-04-20 NOTE — ED Notes (Signed)
Pt's O2 now 88% on RA\ while sleeping, placed pt on 2L [place]

## 2019-04-20 NOTE — ED Notes (Signed)
Pt given lunch tray and cola

## 2019-04-20 NOTE — ED Provider Notes (Signed)
[facility] Emergency Department Provider Note  ____________________________________________   First MD Initiated Contact with Patient 04/20/19 [phone]     (approximate)  I have reviewed the triage vital signs and the nursing notes.   HISTORY  Chief Complaint No chief complaint on file.    HPI Adrian Gillespie is a 69 y.o. male with below list of previous medical conditions including COPD and recent hospitalization with discharge on 2019-04-05 secondary to pneumonia with acute on chronic respiratory failure with hypoxia returns to the emergency department via EMS tonight secondary to progressive dyspnea over the past week. Patient admits to productive cough with green sputum. Patient admits to subjective fever/chills. On arrival to the emergency department patient tachycardic with heart rate of 126 tachypneic respiratory rate of 32 and hypoxic with an oxygen saturation of 87%. Of note patient states that he did stop smoking 2 weeks ago. In addition patient states that he is not prescribed oxygen for home use.        Past Medical History:  Diagnosis Date  . Anxiety   . 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 COPD (chronic obstructive pulmonary disease) (HCC)   . Depression   . GERD (gastroesophageal reflux disease)   . 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 HLD (hyperlipidemia)   . HTN (hypertension)   . Schizophrenia [facility])     Patient Active Problem List   Diagnosis Date Noted  . COPD exacerbation (HCC)   . NSVT (nonsustained ventricular tachycardia) (HCC)   . COPD with acute bronchitis (HCC) 04/02/2019  . Acute respiratory failure with hypoxia (HCC) 04/02/2019  . Hypokalemia 04/02/2019  . Schizophrenia (HCC) 04/02/2019    History reviewed. No pertinent surgical history.  Prior to Admission medications   Medication Sig Start Date End Date Taking? Authorizing Provider  albuterol (VENTOLIN HFA) 108 (90 Base) MCG/ACT inhaler  Inhale 2 puffs into the lungs every 6 (six) hours as needed for shortness of breath or wheezing. 04/05/19  Yes Dhungel, Nishant, MD  aspirin EC 81 MG tablet Take 81 mg by mouth daily.   Yes [provider]  benztropine (COGENTIN) 1 MG tablet Take 1 mg by mouth daily.   Yes [provider]  BEVESPI AEROSPHERE 9-4.8 MCG/ACT AERO Inhale 2 puffs into the lungs at bedtime.  03/31/19  Yes [provider]  guaiFENesin-dextromethorphan (ROBITUSSIN DM) 100-10 MG/5ML syrup Take 5 mLs by mouth every 4 (four) hours as needed for cough. 04/05/19  Yes Dhungel, Nishant, MD  haloperidol (HALDOL) 10 MG tablet Take 10 mg by mouth 2 (two) times daily.   Yes [provider]  hydrOXYzine (VISTARIL) 50 MG capsule Take 50 mg by mouth 2 (two) times daily as needed for anxiety.  04/14/19  Yes [provider]  INVEGA SUSTENNA 234 MG/1.5ML SUSY injection Inject 234 mg into the muscle every 30 (thirty) days. 04/18/19  Yes [provider]  omeprazole (PRILOSEC) 20 MG capsule Take 20 mg by mouth daily.   Yes [provider]  tiotropium (SPIRIVA HANDIHALER) 18 MCG inhalation capsule Place 1 capsule (18 mcg total) into inhaler and inhale daily. 04/05/19  Yes Dhungel, Nishant, MD  traZODone (DESYREL) 100 MG tablet Take 300 mg by mouth at bedtime.   Yes [provider]    Allergies Patient has no known allergies.  Family History  Problem Relation Age of Onset  . Arthritis/Rheumatoid Mother     Social History Social History   Tobacco Use  .  Smoking status: Current Every Day Smoker    Packs/day: 1.00    Types: Cigarettes  . Smokeless tobacco: Never Used  . Tobacco comment: quit 1 month ago  Substance Use Topics  . Alcohol use: Not on file  . Drug use: Not on file    Review of Systems Constitutional: Positive for fever/chills Eyes: No visual changes. ENT: No sore throat. Cardiovascular: Denies chest pain. Respiratory: Positive for cough and  dyspnea Gastrointestinal: No abdominal pain.  No nausea, no vomiting.  No diarrhea.  No constipation. Genitourinary: Negative for dysuria. Musculoskeletal: Negative for neck pain.  Negative for back pain. Integumentary: Negative for rash. Neurological: Negative for headaches, focal weakness or numbness.   ____________________________________________   PHYSICAL EXAM:  VITAL SIGNS: ED Triage Vitals  Enc Vitals Group     BP 04/20/19 0212 137/90     Pulse Rate 04/20/19 0212 (!) 126     Resp 04/20/19 0212 (!) 32     Temp 04/20/19 0212 99.3 F (37.4 C)     Temp Source 04/20/19 0212 Oral     SpO2 04/20/19 0212 (!) 87 %     Weight 04/20/19 0214 78 kg (172 lb)     Height 04/20/19 0214 1.778 m (5\' 10" )     Head Circumference --      Peak Flow --      Pain Score 04/20/19 0214 0     Pain Loc --      Pain Edu? --      Excl. in [place]? --     Constitutional: Alert and oriented. Apparent respiratory distress Eyes: Conjunctivae are normal.  Mouth/Throat: Patient is wearing a mask. Neck: No stridor.  No meningeal signs.   Cardiovascular: Tachycardia, regular rhythm. Good peripheral circulation. Grossly normal heart sounds. Respiratory: Tachypnea, positive accessory respiratory muscle use, diffuse rhonchorous breath sounds Gastrointestinal: Soft and nontender. No distention.  Musculoskeletal: No lower extremity tenderness nor edema. No gross deformities of extremities. Neurologic:  Normal speech and language. No gross focal neurologic deficits are appreciated.  Skin:  Skin is warm, dry and intact. Psychiatric: Mood and affect are normal. Speech and behavior are normal.  ____________________________________________   LABS (all labs ordered are listed, but only abnormal results are displayed)  Labs Reviewed  CBC WITH DIFFERENTIAL/PLATELET - Abnormal; Notable for the following components:      Result Value   WBC 13.9 (*)    Platelets 136 (*)    Neutro Abs 9.8 (*)    Monocytes Absolute  1.2 (*)    Abs Immature Granulocytes 0.08 (*)    All other components within normal limits  COMPREHENSIVE METABOLIC PANEL - Abnormal; Notable for the following components:   Potassium 3.4 (*)    Glucose, Bld 122 (*)    Calcium 8.8 (*)    All other components within normal limits  BLOOD GAS, VENOUS - Abnormal; Notable for the following components:   pO2, Ven 49.0 (*)    Bicarbonate 28.4 (*)    Acid-Base Excess 2.5 (*)    All other components within normal limits  RESPIRATORY PANEL BY RT PCR (FLU A&B, COVID)  CULTURE, BLOOD (ROUTINE X 2)  CULTURE, BLOOD (ROUTINE X 2)  URINE CULTURE  LACTIC ACID, PLASMA  PROTIME-INR  APTT  URINALYSIS, ROUTINE W REFLEX MICROSCOPIC  TROPONIN I (HIGH SENSITIVITY)   ____________________________________________  EKG  ED ECG REPORT I, [place] Elton Heid, the attending physician, personally viewed and interpreted this ECG.   Date: 04/20/2019  EKG Time: 2:13 AM  Rate: 128  Rhythm: Sinus tachycardia  Axis: Normal  Intervals: Normal  ST&T Change: None  ____________________________________________  RADIOLOGY I, [place] N Lauris Serviss, personally viewed and evaluated these images (plain radiographs) as part of my medical decision making, as well as reviewing the written report by the radiologist.  ED MD interpretation: Mild hazy airspace opacities right lung base concerning for early infectious process.  Official radiology report(s): DG Chest Portable 1 View  Result Date: 04/20/2019 CLINICAL DATA:  Shortness of breath EXAM: PORTABLE CHEST 1 VIEW COMPARISON:  April 02, 2019 FINDINGS: The heart size and mediastinal contours are within normal limits. Mildly hazy airspace opacity again noted at the right lung base. No large airspace consolidation or pleural effusion. No acute osseous abnormality. IMPRESSION: Mildly hazy airspace opacity at the right lung base which could be due to atelectasis and/or early infectious etiology. Electronically Signed   By: Jonna Clark M.D.   On: 04/20/2019 02:42    ____________________________________________   PROCEDURES     .Critical Care Performed by: Darci Current, MD Authorized by: Darci Current, MD   Critical care provider statement:    Critical care time (minutes):  60   Critical care time was exclusive of:  Separately billable procedures and treating other patients   Critical care was necessary to treat or prevent imminent or life-threatening deterioration of the following conditions:  Sepsis and respiratory failure   Critical care was time spent personally by me on the following activities:  Development of treatment plan with patient or surrogate, discussions with consultants, evaluation of patient's response to treatment, examination of patient, obtaining history from patient or surrogate, ordering and performing treatments and interventions, ordering and review of laboratory studies, ordering and review of radiographic studies, pulse oximetry, re-evaluation of patient's condition and review of old charts     ____________________________________________   INITIAL IMPRESSION / MDM / ASSESSMENT AND PLAN / ED COURSE  As part of my medical decision making, I reviewed the following data within the electronic MEDICAL RECORD NUMBER  69 year old male presented with above-stated history and physical exam concerning for sepsis with impending respiratory failure. Patient given two duo nebs immediately after my evaluation with minimal improvement in respiratory status and as such patient was placed on BiPAP. On reevaluation after being on BiPAP for 30 minutes patient symptoms improving. Sepsis protocol was initiated with suspicion for pneumonia as the etiology. As such patient was given appropriate IV antibiotic therapy. Chest x-ray revealed possible right lower lobe pneumonia. Laboratory data White blood cell count of 13.9.  5:00 AM patient reevaluated again and appears to be comfortable on BiPAP at  present with blood gas consistent with clinical findings. Patient discussed with Dr. Para March for hospital admission further evaluation and management. ____________________________________________  FINAL CLINICAL IMPRESSION(S) / ED DIAGNOSES  Final diagnoses:  Acute on chronic respiratory failure with hypoxia (HCC)  Community acquired pneumonia of right lower lobe of lung  Sepsis, due to unspecified organism, unspecified whether acute organ dysfunction present [facility])     MEDICATIONS GIVEN DURING THIS VISIT:  Medications  cefTRIAXone (ROCEPHIN) 2 g in sodium chloride 0.9 % 100 mL IVPB (0 g Intravenous Stopped 04/20/19 0345)  doxycycline (VIBRAMYCIN) 100 mg in sodium chloride 0.9 % 250 mL IVPB (100 mg Intravenous New Bag/Given 04/20/19 0259)  ipratropium-albuterol (DUONEB) 0.5-2.5 (3) MG/3ML nebulizer solution 3 mL (3 mLs Nebulization Given 04/20/19 0226)  ipratropium-albuterol (DUONEB) 0.5-2.5 (3) MG/3ML nebulizer solution 3 mL (3 mLs Nebulization Given 04/20/19 0226)  methylPREDNISolone sodium succinate (  SOLU-MEDROL) 125 mg/2 mL injection 125 mg (125 mg Intravenous Given 04/20/19 0252)     ED Discharge Orders    None      *Please note:  Anita Mcadory was evaluated in Emergency Department on 04/20/2019 for the symptoms described in the history of present illness. He was evaluated in the context of the global COVID-19 pandemic, which necessitated consideration that the patient might be at risk for infection with the SARS-CoV-2 virus that causes COVID-19. Institutional protocols and algorithms that pertain to the evaluation of patients at risk for COVID-19 are in a state of rapid change based on information released by regulatory bodies including the CDC and federal and state organizations. These policies and algorithms were followed during the patient's care in the ED.  Some ED evaluations and interventions may be delayed as a result of limited staffing during the pandemic.*  Note:  This document  was prepared using Dragon voice recognition software and may include unintentional dictation errors.   Darci Current, MD 04/20/19 [phone]

## 2019-04-21 LAB — URINE CULTURE: Culture: NO GROWTH

## 2019-04-21 MED ORDER — TIOTROPIUM BROMIDE MONOHYDRATE 18 MCG IN CAPS
18.0000 ug | ORAL_CAPSULE | Freq: Every day | RESPIRATORY_TRACT | Status: DC
  Administered 2019-04-21 – 2019-04-23 (×3): 18 ug via RESPIRATORY_TRACT
  Filled 2019-04-21: qty 5

## 2019-04-21 MED ORDER — APIXABAN 5 MG PO TABS: 5.0000 mg | ORAL_TABLET | Freq: Two times a day (BID) | ORAL | Status: DC

## 2019-04-21 MED ORDER — ALBUTEROL SULFATE (2.5 MG/3ML) 0.083% IN NEBU: 2.5000 mg | INHALATION_SOLUTION | RESPIRATORY_TRACT | Status: DC | PRN

## 2019-04-21 MED ORDER — IPRATROPIUM-ALBUTEROL 0.5-2.5 (3) MG/3ML IN SOLN
3.0000 mL | Freq: Four times a day (QID) | RESPIRATORY_TRACT | Status: DC
  Administered 2019-04-21 – 2019-04-23 (×8): 3 mL via RESPIRATORY_TRACT
  Filled 2019-04-21 (×8): qty 3

## 2019-04-21 MED ORDER — GUAIFENESIN-CODEINE 100-10 MG/5ML PO SOLN
5.0000 mL | Freq: Four times a day (QID) | ORAL | Status: DC | PRN
  Administered 2019-04-21 – 2019-04-23 (×6): 5 mL via ORAL
  Filled 2019-04-21 (×6): qty 5

## 2019-04-21 MED ORDER — APIXABAN 5 MG PO TABS
5.0000 mg | ORAL_TABLET | Freq: Two times a day (BID) | ORAL | Status: DC
  Administered 2019-04-21 – 2019-04-23 (×4): 5 mg via ORAL
  Filled 2019-04-21 (×4): qty 1

## 2019-04-21 MED ORDER — DOXYCYCLINE HYCLATE 100 MG PO TABS
100.0000 mg | ORAL_TABLET | Freq: Two times a day (BID) | ORAL | Status: DC
  Administered 2019-04-21 – 2019-04-23 (×5): 100 mg via ORAL
  Filled 2019-04-21 (×5): qty 1

## 2019-04-21 MED ORDER — BENZONATATE 100 MG PO CAPS
100.0000 mg | ORAL_CAPSULE | Freq: Three times a day (TID) | ORAL | Status: DC
  Administered 2019-04-21 – 2019-04-23 (×7): 100 mg via ORAL
  Filled 2019-04-21 (×7): qty 1

## 2019-04-21 NOTE — Progress Notes (Signed)
[facility] Progress Note  Patient: Adrian Gillespie    GLO:[id number]  DOA: 04/20/2019     Date of Service: the patient was seen and examined on 04/21/2019  Chief complaint. Shortness of breath.  Brief hospital course: Kem Hensen is a 69 y.o. male with medical history significant of hypertension, hyperlipidemia, COPD, asthma, GERD, depression, anxiety, schizophrenia, HBV, tobacco abuse, who presents with cough and shortness breath.  Patient was recently hospitalized from 3/6-3/9 due to COPD exacerbation.  He states that after he was discharged home, he has not fully recovered.  Pt completed a course of Z-Pak and oral prednisone taper, but continues to have shortness breath and productive cough, which has worsened in the past several days. Denies chest pain, fever or chills.  Patient does not have nausea vomiting, diarrhea, abdominal pain, symptoms of UTI or unilateral weakness. Initially pt had oxygen desaturation to 87% on room air, BiPAP was started.  Currently further plan is current treatment..  Assessment and Plan: Acute respiratory failure with hypoxia due to COPD exacerbation and CAP (community acquired pneumonia):  - IV Rocephin and doxycycline - Solu-Medrol 40 mg twice daily - Mucinex for cough  - Incentive spirometry - Bronchodilators - Urine legionella and S. pneumococcal antigen negative - Follow up blood culture x2, sputum culture  Schizophrenia [facility]): the repeated EKG showed Qtc 470 -continue benztropine, Haldol, as needed hydroxyzine  GERD: -Protonix  Hypokalemia: K= 3.4 on admission. - Repleted - Check Mg level  HTN: not on Bp meds now. Bp 110/81 -monitoring Bp closely  PVC (premature ventricular contraction) A. fib with RVR Likely paroxysmal : pt had this issue in recent admission. The initial EKG seems to have A fib with RVR, but the repeated EKG showed sinus rhythm with improvement PVC. It is likely triggered by hypoxia. Dr. Humphrey Rolls of card is  consulted. -f/u card's recommendation  Diet: Cardiac diet DVT Prophylaxis: Therapeutic Anticoagulation with Eliquis per cardiology   Advance goals of care discussion: Full code  Family Communication: no family was present at bedside, at the time of interview.   Disposition:  Pt is from home, admitted with COPD exacerbation, still has shortness of breath, which precludes a safe discharge. Discharge to home, when medically stable.  Subjective: Continues to have shortness of breath.  Continues to have cough.  Feels actually little bit worse than yesterday.  No nausea no vomiting.  No fever no chills.  Physical Exam: General:  alert oriented to time, place, and person.  Appear in moderate distress, affect appropriate Eyes: PERRL ENT: Oral Mucosa Clear, moist  Neck: no JVD,  Cardiovascular: S1 and S2 Present, no Murmur,  Respiratory: increased respiratory effort, Bilateral Air entry equal and Decreased, no Crackles, bilateral  wheezes Abdomen: Bowel Sound present, Soft and no tenderness,  Skin: no rash Extremities: no Pedal edema, no calf tenderness Neurologic: without any new focal findings Gait not checked due to patient safety concerns  Vitals:   04/21/19 0753 04/21/19 0925 04/21/19 1046 04/21/19 1510  BP:  125/87 119/78 (!) 126/97  Pulse:  81 84 79  Resp:  19 19 19   Temp:  97.7 F (36.5 C) 98.2 F (36.8 C) 98 F (36.7 C)  TempSrc:  Oral  Oral  SpO2: 94% 92% 91% 96%  Weight:      Height:        Intake/Output Summary (Last 24 hours) at 04/21/2019 1917 Last data filed at 04/21/2019 1815 Gross per 24 hour  Intake 720 ml  Output 3405 ml  Net -  2685 ml   Filed Weights   04/20/19 0214  Weight: 78 kg    Data Reviewed: I have personally reviewed and interpreted daily labs, tele strips, imagings as discussed above. I reviewed all nursing notes, pharmacy notes, vitals, pertinent old records I have discussed plan of care as described above with RN and  patient/family.  CBC: Recent Labs  Lab 04/20/19 0227  WBC 13.9*  NEUTROABS 9.8*  HGB 14.8  HCT 44.7  MCV 87.8  PLT 136*   Basic Metabolic Panel: Recent Labs  Lab 04/20/19 0227  NA 140  K 3.4*  CL 106  CO2 24  GLUCOSE 122*  BUN 8  CREATININE 0.91  CALCIUM 8.8*  MG 2.1    Studies: No results found.  Scheduled Meds: . apixaban  5 mg Oral BID  . benzonatate  100 mg Oral TID  . benztropine  1 mg Oral Daily  . dextromethorphan-guaiFENesin  1 tablet Oral BID  . doxycycline  100 mg Oral Q12H  . haloperidol  10 mg Oral BID  . ipratropium-albuterol  3 mL Nebulization Q6H  . methylPREDNISolone (SOLU-MEDROL) injection  40 mg Intravenous Q12H  . nicotine  21 mg Transdermal Daily  . pantoprazole  40 mg Oral Daily  . tiotropium  18 mcg Inhalation Daily  . traZODone  300 mg Oral QHS   Continuous Infusions: . cefTRIAXone (ROCEPHIN)  IV 2 g (04/21/19 0255)   PRN Meds: acetaminophen, albuterol, guaiFENesin-codeine, hydrOXYzine  Time spent: 35 minutes  Author: Lynden Oxford, MD [facility] 04/21/2019 7:17 PM  To reach On-call, see care teams to locate the attending and reach out to them via [email or web address]. If 7PM-7AM, please contact night-coverage If you still have difficulty reaching the attending provider, please page the [facility] (Director on Call) for [facility] on amion for assistance.

## 2019-04-21 NOTE — Consult Note (Addendum)
Adrian Gillespie is a 69 y.o. male  [id number]  Primary Cardiologist: Adrian Blackwater Reason for Consultation: A fib with RVR  HPI: The patient is a 69 year old Caucasian male with a past medical history of hypertension, hyperlipidemia, COPD, GERD, depression, schizophrenia, and tobacco abuse.  Patient was recently hospitalized earlier this month due to a COPD exacerbation.  Patient states that he never fully recovered and presented back to the emergency room with shortness of breath and cough.  Initial EKG showed atrial fibrillation with rapid ventricular rate with a repeat EKG showing normal sinus rhythm.    Review of Systems: Patient denies chest pain.  He states his breathing has improved.  Patient states his cough is his biggest concern at the moment.   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 18 g 3   aspirin EC 81 MG tablet Take 81 mg by mouth daily.     benztropine (COGENTIN) 1 MG tablet Take 1 mg by mouth daily.     BEVESPI AEROSPHERE 9-4.8 MCG/ACT AERO Inhale 2 puffs into the lungs at bedtime.      guaiFENesin-dextromethorphan (ROBITUSSIN DM) 100-10 MG/5ML syrup Take 5 mLs by mouth every 4 (four) hours as needed for cough. 118 mL 0   haloperidol (HALDOL) 10 MG tablet Take 10 mg by mouth 2 (two) times daily.     hydrOXYzine (VISTARIL) 50 MG capsule Take 50 mg by mouth 2 (two) times daily as needed for anxiety.      INVEGA SUSTENNA 234 MG/1.5ML SUSY injection Inject 234 mg into the  muscle every 30 (thirty) days.     omeprazole (PRILOSEC) 20 MG capsule Take 20 mg by mouth daily.     tiotropium (SPIRIVA HANDIHALER) 18 MCG inhalation capsule Place 1 capsule (18 mcg total) into inhaler and inhale daily. 30 capsule 3   traZODone (DESYREL) 100 MG tablet Take 300 mg by mouth at bedtime.        aspirin EC  81 mg Oral Daily   benztropine  1 mg Oral Daily   dextromethorphan-guaiFENesin  1 tablet Oral BID   doxycycline  100 mg Oral Q12H   enoxaparin (LOVENOX) injection  40 mg Subcutaneous Q24H   haloperidol  10 mg Oral BID   ipratropium-albuterol  3 mL Nebulization Q6H   methylPREDNISolone (SOLU-MEDROL) injection  40 mg Intravenous Q12H   nicotine  21 mg Transdermal Daily   pantoprazole  40 mg Oral Daily   tiotropium  18 mcg Inhalation Daily   traZODone  300 mg Oral QHS    Infusions:  cefTRIAXone (ROCEPHIN)  IV 2 g (04/21/19 0255)    No Known Allergies  Social History   Socioeconomic History   Marital status: Single    Spouse name: Not on file   Number of children: Not on file   Years of education: Not on file   Highest education level: Not on file  Occupational History   Not  on file  Tobacco Use   Smoking status: Current Every Day Smoker    Packs/day: 1.00    Types: Cigarettes   Smokeless tobacco: Never Used   Tobacco comment: quit 1 month ago  Substance and Sexual Activity   Alcohol use: Not on file   Drug use: Not on file   Sexual activity: Not on file  Other Topics Concern   Not on file  Social History Narrative   Not on file   Social Determinants of Health   Financial Resource Strain:    Difficulty of Paying Living Expenses:   Food Insecurity:    Worried About Charity fundraiser in the Last Year:    Arboriculturist in the Last Year:   Transportation Needs:    Film/video editor (Medical):    Lack of Transportation (Non-Medical):   Physical Activity:    Days of Exercise per Week:    Minutes of Exercise per Session:   Stress:     Feeling of Stress :   Social Connections:    Frequency of Communication with Friends and Family:    Frequency of Social Gatherings with Friends and Family:    Attends Religious Services:    Active Member of Clubs or Organizations:    Attends Music therapist:    Marital Status:   Intimate Partner Violence:    Fear of Current or Ex-Partner:    Emotionally Abused:    Physically Abused:    Sexually Abused:     Family History  Problem Relation Age of Onset   Arthritis/Rheumatoid Mother     PHYSICAL EXAM: Vitals:   04/21/19 0753 04/21/19 0925  BP:  125/87  Pulse:  81  Resp:  19  Temp:  97.7 F (36.5 C)  SpO2: 94% 92%     Intake/Output Summary (Last 24 hours) at 04/21/2019 1028 Last data filed at 04/21/2019 0927 Gross per 24 hour  Intake 480 ml  Output 1555 ml  Net -1075 ml    General:  Well appearing. No respiratory difficulty HEENT: normal Neck: supple. no JVD. Carotids 2+ bilat; no bruits. No lymphadenopathy or thryomegaly appreciated. Cor: PMI nondisplaced. Regular rate & rhythm. No rubs, gallops or murmurs. Lungs: clear Abdomen: soft, nontender, nondistended. No hepatosplenomegaly. No bruits or masses. Good bowel sounds. Extremities: no cyanosis, clubbing, rash, edema Neuro: alert & oriented x 3, cranial nerves grossly intact. moves all 4 extremities w/o difficulty. Affect pleasant.  ECG: 3/24 213 -atrial fibrillation with rapid ventricular rate.  Rate of 128/bpm  3/24 1126 -normal sinus rhythm with first-degree block and PVC.  Rate of 88/bpm  Results for orders placed or performed during the hospital encounter of 04/20/19 (from the past 24 hour(s))  HIV Antibody (routine testing w rflx)     Status: None   Collection Time: 04/20/19  3:32 PM  Result Value Ref Range   HIV Screen 4th Generation wRfx NON REACTIVE NON REACTIVE  Strep pneumoniae urinary antigen     Status: None   Collection Time: 04/20/19  3:32 PM  Result Value Ref Range   Strep Pneumo  Urinary Antigen NEGATIVE NEGATIVE  Culture, sputum-assessment     Status: None   Collection Time: 04/20/19  3:32 PM   Specimen: Sputum  Result Value Ref Range   Specimen Description SPUTUM    Special Requests NONE    Sputum evaluation      Sputum specimen not acceptable for testing.  Please recollect.   SPOKE TO [place]  04/20/19 @ 1659  MLK Performed at [facility], [address] [place], [place] [address]    Report Status 04/20/2019 FINAL   Urinalysis, Routine w reflex microscopic     Status: Abnormal   Collection Time: 04/20/19  3:32 PM  Result Value Ref Range   Color, Urine YELLOW (A) YELLOW   APPearance CLEAR (A) CLEAR   Specific Gravity, Urine 1.009 1.005 - 1.030   pH 5.0 5.0 - 8.0   Glucose, UA NEGATIVE NEGATIVE mg/dL   Hgb urine dipstick SMALL (A) NEGATIVE   Bilirubin Urine NEGATIVE NEGATIVE   Ketones, ur NEGATIVE NEGATIVE mg/dL   Protein, ur NEGATIVE NEGATIVE mg/dL   Nitrite NEGATIVE NEGATIVE   Leukocytes,Ua NEGATIVE NEGATIVE   RBC / HPF 0-5 0 - 5 RBC/hpf   WBC, UA 0-5 0 - 5 WBC/hpf   Bacteria, UA NONE SEEN NONE SEEN   Squamous Epithelial / LPF NONE SEEN 0 - 5   Mucus PRESENT    DG Chest Portable 1 View  Result Date: 04/20/2019 CLINICAL DATA:  Shortness of breath EXAM: PORTABLE CHEST 1 VIEW COMPARISON:  April 02, 2019 FINDINGS: The heart size and mediastinal contours are within normal limits. Mildly hazy airspace opacity again noted at the right lung base. No large airspace consolidation or pleural effusion. No acute osseous abnormality. IMPRESSION: Mildly hazy airspace opacity at the right lung base which could be due to atelectasis and/or early infectious etiology. Electronically Signed   By: Jonna Clark M.D.   On: 04/20/2019 02:42     ASSESSMENT AND PLAN: On assessment patient resting comfortably. BP and HR stable.  In the setting of a COPD exacerbation and hypoxia, patient had paroxysmal A. fib with RVR which has since resolved.  Patient denies  any further shortness of breath or chest pain.  Recent echo on March 9 showed an ejection fraction of 55 to 60% with no significant structural abnormalities.  In the setting of a resolved atrial fibrillation, will not repeat echo at this time. Atrial fibrillation most likely induced by hypoxia.  With his CHADS-VASc score being 3 we will start patient on anticoagulation and have a further work-up outpatient. Agree.  Maryelizabeth Kaufmann, NP-C

## 2019-04-22 LAB — LEGIONELLA PNEUMOPHILA SEROGP 1 UR AG: L. pneumophila Serogp 1 Ur Ag: NEGATIVE

## 2019-04-22 LAB — CREATININE, SERUM
Creatinine, Ser: 0.85 mg/dL (ref 0.61–1.24)
GFR calc Af Amer: >60 mL/min (ref >60)
GFR calc non Af Amer: >60 mL/min (ref >60)

## 2019-04-22 LAB — CBC
HCT: 43.3 % (ref 39.0–52.0)
Hemoglobin: 14.3 g/dL (ref 13.0–17.0)
MCH: 29.2 pg (ref 26.0–34.0)
MCHC: 33 g/dL (ref 30.0–36.0)
MCV: 88.5 fL (ref 80.0–100.0)
Platelets: 183 10*3/uL (ref 150–400)
RBC: 4.89 MIL/uL (ref 4.22–5.81)
RDW: 14.2 % (ref 11.5–15.5)
WBC: 14.6 10*3/uL — ABNORMAL HIGH (ref 4.0–10.5)
nRBC: 0 % (ref 0.0–0.2)

## 2019-04-22 MED ORDER — CEFDINIR 300 MG PO CAPS
300.0000 mg | ORAL_CAPSULE | Freq: Two times a day (BID) | ORAL | Status: DC
  Administered 2019-04-22 – 2019-04-23 (×3): 300 mg via ORAL
  Filled 2019-04-22 (×4): qty 1

## 2019-04-22 MED ORDER — PREDNISONE 50 MG PO TABS
50.0000 mg | ORAL_TABLET | Freq: Every day | ORAL | Status: DC
  Administered 2019-04-23: 08:00:00 50 mg via ORAL
  Filled 2019-04-22: qty 1

## 2019-04-22 NOTE — Progress Notes (Signed)
SUBJECTIVE: Patient resting comfortably.  Patient denies any chest pain and attest that his breathing has improved.  Patient also states that his cough has improved.   Vitals:   04/22/19 0351 04/22/19 0737 04/22/19 0750 04/22/19 0941  BP: 123/80 (!) 123/91    Pulse: 68 63 63   Resp: 19 18 18    Temp: 98.2 F (36.8 C) 97.7 F (36.5 C)    TempSrc: Oral Oral    SpO2: 92% 94% 95%   Weight:    76.9 kg  Height:        Intake/Output Summary (Last 24 hours) at 04/22/2019 1010 Last data filed at 04/22/2019 0941 Gross per 24 hour  Intake 240 ml  Output 3625 ml  Net -3385 ml    LABS: Basic Metabolic Panel: Recent Labs    04/20/19 0227 04/22/19 0531  NA 140  --   K 3.4*  --   CL 106  --   CO2 24  --   GLUCOSE 122*  --   BUN 8  --   CREATININE 0.91 0.85  CALCIUM 8.8*  --   MG 2.1  --    Liver Function Tests: Recent Labs    04/20/19 0227  AST 18  ALT 19  ALKPHOS 63  BILITOT 0.6  PROT 6.7  ALBUMIN 3.9   No results for input(s): LIPASE, AMYLASE in the last 72 hours. CBC: Recent Labs    04/20/19 0227 04/22/19 0531  WBC 13.9* 14.6*  NEUTROABS 9.8*  --   HGB 14.8 14.3  HCT 44.7 43.3  MCV 87.8 88.5  PLT 136* 183   Cardiac Enzymes: No results for input(s): CKTOTAL, CKMB, CKMBINDEX, TROPONINI in the last 72 hours. BNP: Invalid input(s): POCBNP D-Dimer: No results for input(s): DDIMER in the last 72 hours. Hemoglobin A1C: No results for input(s): HGBA1C in the last 72 hours. Fasting Lipid Panel: No results for input(s): CHOL, HDL, LDLCALC, TRIG, CHOLHDL, LDLDIRECT in the last 72 hours. Thyroid Function Tests: No results for input(s): TSH, T4TOTAL, T3FREE, THYROIDAB in the last 72 hours.  Invalid input(s): FREET3 Anemia Panel: No results for input(s): VITAMINB12, FOLATE, FERRITIN, TIBC, IRON, RETICCTPCT in the last 72 hours.   PHYSICAL EXAM General: Well developed, well nourished, in no acute distress HEENT:  Normocephalic and atramatic Neck:  No JVD.   Lungs: Scattered expiratory wheezes bilaterally to auscultation and percussion. Heart: HRRR . Normal S1 and S2 without gallops or murmurs.  Abdomen: Bowel sounds are positive, abdomen soft and non-tender  Msk:  Back normal, normal gait. Normal strength and tone for age. Extremities: No clubbing, cyanosis or edema.   Neuro: Alert and oriented X 3. Psych:  Good affect, responds appropriately  TELEMETRY: NSR 70s  ASSESSMENT AND PLAN: Patient originally seen due to atrial fibrillation with rapid ventricular rate.  Atrial fibrillation has since resolved and patient continues to maintain normal sinus rhythm.  Atrial fibrillation was most likely induced by hypoxia d/t COPD exacerbation. Patient currently hemodynamically stable.  Please continue his anticoagulation with Eliquis twice daily.  From a cardiac point of view patient is currently stable and we will continue to manage the patient in the outpatient setting.  Please ensure the patient will have a appointment at [facility] within 2 weeks of discharge   Principal Problem:   COPD exacerbation (HCC) Active Problems:   Acute respiratory failure with hypoxia (HCC)   Hypokalemia   Schizophrenia (HCC)   NSVT (nonsustained ventricular tachycardia) (HCC)   CAP (community acquired pneumonia)  PVC (premature ventricular contraction)   GERD (gastroesophageal reflux disease)   HTN (hypertension)    Adaline Sill, NP-C 04/22/2019 10:10 AM

## 2019-04-22 NOTE — Progress Notes (Signed)
[facility] Progress Note  Patient: Adrian Gillespie    ONG:[id number]  DOA: 04/20/2019     Date of Service: the patient was seen and examined on 04/22/2019  Chief complaint. Shortness of breath.  Brief hospital course: Adrian Gillespie is a 69 y.o. male with medical history significant of hypertension, hyperlipidemia, COPD, asthma, GERD, depression, anxiety, schizophrenia, HBV, tobacco abuse, who presents with cough and shortness breath.  Patient was recently hospitalized from 3/6-3/9 due to COPD exacerbation.  He states that after he was discharged home, he has not fully recovered.  Pt completed a course of Z-Pak and oral prednisone taper, but continues to have shortness breath and productive cough, which has worsened in the past several days. Denies chest pain, fever or chills.  Patient does not have nausea vomiting, diarrhea, abdominal pain, symptoms of UTI or unilateral weakness. Initially pt had oxygen desaturation to 87% on room air, BiPAP was started.  Currently further plan is current treatment..  Assessment and Plan: Acute respiratory failure with hypoxia due to COPD exacerbation and CAP (community acquired pneumonia):  - IV Rocephin and doxycycline, changed to oral. - Solu-Medrol 40 mg twice daily, changed to oral - Mucinex for cough  - Incentive spirometry - Bronchodilators - Urine legionella and S. pneumococcal antigen negative - Follow up blood culture x2, sputum culture Likely home tomorrow.  Schizophrenia [facility]): the repeated EKG showed Qtc 470 -continue benztropine, Haldol, as needed hydroxyzine  GERD: -Protonix  Hypokalemia: K= 3.4 on admission. - Repleted  HTN: not on Bp meds now -monitoring Bp closely  PVC (premature ventricular contraction) A. fib with RVR Likely paroxysmal : pt had this issue in recent admission. The initial EKG seems to have A fib with RVR, but the repeated EKG showed sinus rhythm with improvement PVC. It is likely triggered by  hypoxia. Dr. Welton Flakes of card is consulted. -f/u card's recommendation  Diet: Cardiac diet DVT Prophylaxis: Therapeutic Anticoagulation with Eliquis per cardiology   Advance goals of care discussion: Full code  Family Communication: no family was present at bedside, at the time of interview.   Disposition:  Pt is from home, admitted with COPD exacerbation, still has shortness of breath, which precludes a safe discharge. Discharge to home, when medically stable.  Subjective: Continues to report cough.  Continues to report shortness of breath but somewhat better.  No nausea no vomiting.  Oral intake improving.  Physical Exam: General:  alert oriented to time, place, and person.  Appear in moderate distress, affect appropriate Eyes: PERRL ENT: Oral Mucosa Clear, moist  Neck: no JVD,  Cardiovascular: S1 and S2 Present, no Murmur,  Respiratory: increased respiratory effort, Bilateral Air entry equal and Decreased, no Crackles, bilateral  wheezes Abdomen: Bowel Sound present, Soft and no tenderness,  Skin: no rash Extremities: no Pedal edema, no calf tenderness Neurologic: without any new focal findings Gait not checked due to patient safety concerns  Vitals:   04/22/19 1242 04/22/19 1342 04/22/19 1405 04/22/19 1616  BP:    (!) 138/93  Pulse:   79 79  Resp:   16 18  Temp:    98.7 F (37.1 C)  TempSrc:      SpO2: 92% 90% 91% 90%  Weight:      Height:        Intake/Output Summary (Last 24 hours) at 04/22/2019 2026 Last data filed at 04/22/2019 0941 Gross per 24 hour  Intake -  Output 1975 ml  Net -1975 ml   Filed Weights   04/20/19 0214  04/22/19 0941  Weight: 78 kg 76.9 kg    Data Reviewed: I have personally reviewed and interpreted daily labs, tele strips, imagings as discussed above. I reviewed all nursing notes, pharmacy notes, vitals, pertinent old records I have discussed plan of care as described above with RN and patient/family.  CBC: Recent Labs  Lab 04/20/19  0227 04/22/19 0531  WBC 13.9* 14.6*  NEUTROABS 9.8*  --   HGB 14.8 14.3  HCT 44.7 43.3  MCV 87.8 88.5  PLT 136* 397   Basic Metabolic Panel: Recent Labs  Lab 04/20/19 0227 04/22/19 0531  NA 140  --   K 3.4*  --   CL 106  --   CO2 24  --   GLUCOSE 122*  --   BUN 8  --   CREATININE 0.91 0.85  CALCIUM 8.8*  --   MG 2.1  --     Studies: No results found.  Scheduled Meds: . apixaban  5 mg Oral BID  . benzonatate  100 mg Oral TID  . benztropine  1 mg Oral Daily  . cefdinir  300 mg Oral Q12H  . dextromethorphan-guaiFENesin  1 tablet Oral BID  . doxycycline  100 mg Oral Q12H  . haloperidol  10 mg Oral BID  . ipratropium-albuterol  3 mL Nebulization Q6H  . nicotine  21 mg Transdermal Daily  . pantoprazole  40 mg Oral Daily  . [START ON 04/23/2019] predniSONE  50 mg Oral Q breakfast  . tiotropium  18 mcg Inhalation Daily  . traZODone  300 mg Oral QHS   Continuous Infusions:  PRN Meds: acetaminophen, albuterol, guaiFENesin-codeine, hydrOXYzine  Time spent: 35 minutes  Author: Berle Mull, MD [facility] 04/22/2019 8:26 PM  To reach On-call, see care teams to locate the attending and reach out to them via [email or web address]. If 7PM-7AM, please contact night-coverage If you still have difficulty reaching the attending provider, please page the [facility] (Director on Call) for [facility] on amion for assistance.

## 2019-04-22 NOTE — Progress Notes (Signed)
SATURATION QUALIFICATIONS: (This note is used to comply with regulatory documentation for home oxygen)  Patient Saturations on Room Air at Rest = 93%  Patient Saturations on Room Air while Ambulating = 90%  Patient Saturations on n/a Liters of oxygen while Ambulating = n/a%  Please briefly explain why patient needs home oxygen: No need. Ambulated on room air with O2 saturation staying >88%.

## 2019-04-22 NOTE — Progress Notes (Signed)
Attempted flutter education, pt sleeping. I will re-attempt later today. 

## 2019-04-22 NOTE — Care Management Important Message (Signed)
Important Message  Patient Details  Name: Adrian Gillespie MRN: [id number] Date of Birth: [birth year]   Medicare Important Message Given:  Yes     [name] 04/22/2019, 11:48 AM

## 2019-04-23 LAB — BASIC METABOLIC PANEL
Anion gap: 9 (ref 5–15)
BUN: 20 mg/dL (ref 8–23)
CO2: 25 mmol/L (ref 22–32)
Calcium: 9 mg/dL (ref 8.9–10.3)
Chloride: 103 mmol/L (ref 98–111)
Creatinine, Ser: 0.97 mg/dL (ref 0.61–1.24)
GFR calc Af Amer: >60 mL/min (ref >60)
GFR calc non Af Amer: >60 mL/min (ref >60)
Glucose, Bld: 106 mg/dL — ABNORMAL HIGH (ref 70–99)
Potassium: 4.1 mmol/L (ref 3.5–5.1)
Sodium: 137 mmol/L (ref 135–145)

## 2019-04-23 LAB — MAGNESIUM: Magnesium: 2.3 mg/dL (ref 1.7–2.4)

## 2019-04-23 MED ORDER — CEFDINIR 300 MG PO CAPS: 300.0000 mg | ORAL_CAPSULE | Freq: Two times a day (BID) | ORAL | 0 refills | Status: AC

## 2019-04-23 MED ORDER — BENZONATATE 100 MG PO CAPS: 100.0000 mg | ORAL_CAPSULE | Freq: Three times a day (TID) | ORAL | 0 refills | Status: DC

## 2019-04-23 MED ORDER — APIXABAN 5 MG PO TABS: 5.0000 mg | ORAL_TABLET | Freq: Two times a day (BID) | ORAL | 0 refills | Status: DC

## 2019-04-23 MED ORDER — DOXYCYCLINE HYCLATE 100 MG PO TABS: 100.0000 mg | ORAL_TABLET | Freq: Two times a day (BID) | ORAL | 0 refills | Status: AC

## 2019-04-23 MED ORDER — NICOTINE 21 MG/24HR TD PT24: 21.0000 mg | MEDICATED_PATCH | Freq: Every day | TRANSDERMAL | 0 refills | Status: DC

## 2019-04-23 MED ORDER — PREDNISONE 10 MG PO TABS: ORAL_TABLET | ORAL | 0 refills | Status: DC

## 2019-04-23 NOTE — Progress Notes (Signed)
Patient taken off the floor via wheelchair with two personal O2 tanks. No distress noted.

## 2019-04-23 NOTE — Progress Notes (Signed)
SATURATION QUALIFICATIONS: (This note is used to comply with regulatory documentation for home oxygen)  Patient Saturations on Room Air at Rest = 88%  Patient Saturations on Room Air while Ambulating =na%  Patient Saturations on 2 Liters of oxygen while at rest = 93%  Please briefly explain why patient needs home oxygen:

## 2019-04-23 NOTE — Plan of Care (Signed)
  Problem: Education: Goal: Knowledge of General Education information will improve Description: Including pain rating scale, medication(s)/side effects and non-pharmacologic comfort measures Outcome: Progressing   Problem: Activity: Goal: Risk for activity intolerance will decrease Outcome: Progressing   Problem: Clinical Measurements: Goal: Respiratory complications will improve Outcome: Not Progressing Note: Pt oxygen at rest at 88%. Placed back at 2 liters sating at 92%

## 2019-04-23 NOTE — Progress Notes (Signed)
Adrian Gillespie to be D/C'd Home per MD order.  Discussed prescriptions and follow up appointments with the patient and brother Prescriptions electronically submitted medication list explained in detail. Pt and brother verbalized  Understanding. eliquis coupon given to patient. Two personal 02 tanks delivered to bedside.   Allergies as of 04/23/2019   No Known Allergies     Medication List    STOP taking these medications   aspirin EC 81 MG tablet     TAKE these medications   albuterol 108 (90 Base) MCG/ACT inhaler Commonly known as: VENTOLIN HFA Inhale 2 puffs into the lungs every 6 (six) hours as needed for shortness of breath or wheezing.   apixaban 5 MG Tabs tablet Commonly known as: ELIQUIS Take 1 tablet (5 mg total) by mouth 2 (two) times daily.   benzonatate 100 MG capsule Commonly known as: TESSALON Take 1 capsule (100 mg total) by mouth 3 (three) times daily.   benztropine 1 MG tablet Commonly known as: COGENTIN Take 1 mg by mouth daily.   Bevespi Aerosphere 9-4.8 MCG/ACT Aero Generic drug: Glycopyrrolate-Formoterol Inhale 2 puffs into the lungs at bedtime.   cefdinir 300 MG capsule Commonly known as: OMNICEF Take 1 capsule (300 mg total) by mouth 2 (two) times daily for 2 days.   doxycycline 100 MG tablet Commonly known as: VIBRA-TABS Take 1 tablet (100 mg total) by mouth 2 (two) times daily for 2 days.   guaiFENesin-dextromethorphan 100-10 MG/5ML syrup Commonly known as: ROBITUSSIN DM Take 5 mLs by mouth every 4 (four) hours as needed for cough.   haloperidol 10 MG tablet Commonly known as: HALDOL Take 10 mg by mouth 2 (two) times daily.   hydrOXYzine 50 MG capsule Commonly known as: VISTARIL Take 50 mg by mouth 2 (two) times daily as needed for anxiety.   Adrian Gillespie Sustenna 234 MG/1.5ML Susy injection Generic drug: paliperidone Inject 234 mg into the muscle every 30 (thirty) days.   nicotine 21 mg/24hr patch Commonly known as: NICODERM CQ - dosed in  mg/24 hours Place 1 patch (21 mg total) onto the skin daily.   omeprazole 20 MG capsule Commonly known as: PRILOSEC Take 20 mg by mouth daily.   predniSONE 10 MG tablet Commonly known as: DELTASONE Take 50mg  daily for 3days,Take 40mg  daily for 3days,Take 30mg  daily for 3days,Take 20mg  daily for 3days,Take 10mg  daily for 3days, then stop   Spiriva HandiHaler 18 MCG inhalation capsule Generic drug: tiotropium Place 1 capsule (18 mcg total) into inhaler and inhale daily.   traZODone 100 MG tablet Commonly known as: DESYREL Take 300 mg by mouth at bedtime.            Durable Medical Equipment  (From admission, onward)         Start     Ordered   04/23/19 1104  For home use only DME oxygen  Once    Question Answer Comment  Length of Need Lifetime   Mode or (Route) Nasal cannula   Liters per Minute 2   Frequency Continuous (stationary and portable oxygen unit needed)   Oxygen delivery system Gas      04/23/19 1103          Vitals:   04/23/19 1106 04/23/19 1213  BP:  (!) 125/96  Pulse:  84  Resp:  17  Temp:  97.6 F (36.4 C)  SpO2: 93% 93%    Skin clean, dry and intact without evidence of skin break down, no evidence of skin tears noted. IV  catheter discontinued intact. Site without signs and symptoms of complications. Dressing and pressure applied. Pt denies pain at this time. No complaints noted.  An After Visit Summary was printed and given to the patient. Patient waiting on ridge to be D/C home via private auto.  [place]

## 2019-04-23 NOTE — Discharge Summary (Signed)
[facility] Discharge Summary   Patient: Adrian Gillespie OEV:[id number]  PCP: Andres Shad, MD  Date of admission: 04/20/2019   Date of discharge: 04/23/2019     Discharge Diagnoses:  Principal Problem:   COPD exacerbation (HCC) Active Problems:   Acute respiratory failure with hypoxia (HCC)   Hypokalemia   Schizophrenia (HCC)   NSVT (nonsustained ventricular tachycardia) (HCC)   CAP (community acquired pneumonia)   PVC (premature ventricular contraction)   GERD (gastroesophageal reflux disease)   HTN (hypertension)   Admitted From: Home Disposition:  Home   Recommendations for Outpatient Follow-up:  1. PCP: Follow-up with PCP in 1 week 2. Follow up LABS/TEST: None  Follow-up Information    Andres Shad, MD. Schedule an appointment as soon as possible for a visit in 1 week(s).   Specialty: Internal Medicine Contact information: [address] [place] [address] [phone]          Diet recommendation: Cardiac diet  Activity: The patient is advised to gradually reintroduce usual activities, as tolerated  Discharge Condition: stable  Code Status: Full code   History of present illness: As per the H and P dictated on admission, "Adrian Gillespie is a 69 y.o. male with medical history significant of hypertension, hyperlipidemia, COPD, asthma, GERD, depression, anxiety, schizophrenia, HBV, tobacco abuse, who presents with cough and shortness breath.  Patient was recently hospitalized from 3/6-3/9 due to COPD exacerbation.  He states that after he was discharged home, he has not fully recovered.  Pt completed a course of Z-Pak and oral prednisone taper, but continues to have shortness breath and productive cough, which has worsened in the past several days. Denies chest pain, fever or chills.  Patient does not have nausea vomiting, diarrhea, abdominal pain, symptoms of UTI or unilateral weakness. Initially pt had oxygen desaturation to 87% on room  air, BiPAP was started. "  Hospital Course:  Presented with complaints of shortness of breath.  Found to have COPD exacerbation along with A. fib with RVR.  RVR resolved and COPD improved.  Summary of his active problems in the hospital is as following.   Acute respiratory failure with hypoxiadue to COPD exacerbationand CAP(community acquired pneumonia): -Initially treated with Rocephin and doxycycline, changed to oral. -Solu-Medrol 40 mg twice daily, changed to oral - Mucinex for cough -Incentive spirometry -Bronchodilators - Urine legionella and S. pneumococcal antigen negative Currently requiring 2 L of oxygen at the time of the discharge.  Schizophrenia (HCC):the repeated EKG showed Qtc 470 -continuebenztropine, Haldol, as needed hydroxyzine  GERD: -Protonix  Hypokalemia: K=3.4on admission. - Repleted  HTN: not on Bp meds now  PVC (premature ventricular contraction) A. fib with RVR currently sinus. paroxysmal pt had this issue inrecent admission.  The initial EKG seems to have A fib with RVR, but the repeated EKG showedsinus rhythm with improvement PVC.It is likely triggered by hypoxia. Dr. Welton Flakes of card is consulted. Patient was started on Eliquis in the hospital. Did not require any rate control medication.   Patient was ambulatory without any assistance. On the day of the discharge the patient's vitals were stable, and no other acute medical condition were reported by patient. the patient was felt safe to be discharge at Home with no therapy needed on discharge.  Consultants: Cardiology  Procedures: none  Discharge Exam: General: Appear in mild distress, no Rash; Oral Mucosa Clear, moist. Cardiovascular: S1 and S2 Present, no Murmur, Respiratory: normal respiratory effort, Bilateral Air entry present and no Crackles, Occasional  wheezes Abdomen: Bowel  Sound present, Soft and no tenderness, no hernia Extremities: no Pedal edema, no calf  tenderness Neurology: alert and oriented to time, place, and person affect appropriate.  Filed Weights   04/20/19 0214 04/22/19 0941 04/23/19 0610  Weight: 78 kg 76.9 kg 76.3 kg   Vitals:   04/23/19 1106 04/23/19 1213  BP:  (!) 125/96  Pulse:  84  Resp:  17  Temp:  97.6 F (36.4 C)  SpO2: 93% 93%    DISCHARGE MEDICATION: Allergies as of 04/23/2019   No Known Allergies     Medication List    STOP taking these medications   aspirin EC 81 MG tablet     TAKE these medications   albuterol 108 (90 Base) MCG/ACT inhaler Commonly known as: VENTOLIN HFA Inhale 2 puffs into the lungs every 6 (six) hours as needed for shortness of breath or wheezing.   apixaban 5 MG Tabs tablet Commonly known as: ELIQUIS Take 1 tablet (5 mg total) by mouth 2 (two) times daily.   benzonatate 100 MG capsule Commonly known as: TESSALON Take 1 capsule (100 mg total) by mouth 3 (three) times daily.   benztropine 1 MG tablet Commonly known as: COGENTIN Take 1 mg by mouth daily.   Bevespi Aerosphere 9-4.8 MCG/ACT Aero Generic drug: Glycopyrrolate-Formoterol Inhale 2 puffs into the lungs at bedtime.   cefdinir 300 MG capsule Commonly known as: OMNICEF Take 1 capsule (300 mg total) by mouth 2 (two) times daily for 2 days.   doxycycline 100 MG tablet Commonly known as: VIBRA-TABS Take 1 tablet (100 mg total) by mouth 2 (two) times daily for 2 days.   guaiFENesin-dextromethorphan 100-10 MG/5ML syrup Commonly known as: ROBITUSSIN DM Take 5 mLs by mouth every 4 (four) hours as needed for cough.   haloperidol 10 MG tablet Commonly known as: HALDOL Take 10 mg by mouth 2 (two) times daily.   hydrOXYzine 50 MG capsule Commonly known as: VISTARIL Take 50 mg by mouth 2 (two) times daily as needed for anxiety.   [name] Sustenna 234 MG/1.5ML Susy injection Generic drug: paliperidone Inject 234 mg into the muscle every 30 (thirty) days.   nicotine 21 mg/24hr patch Commonly known as: NICODERM  CQ - dosed in mg/24 hours Place 1 patch (21 mg total) onto the skin daily.   omeprazole 20 MG capsule Commonly known as: PRILOSEC Take 20 mg by mouth daily.   predniSONE 10 MG tablet Commonly known as: DELTASONE Take 50mg  daily for 3days,Take 40mg  daily for 3days,Take 30mg  daily for 3days,Take 20mg  daily for 3days,Take 10mg  daily for 3days, then stop   Spiriva HandiHaler 18 MCG inhalation capsule Generic drug: tiotropium Place 1 capsule (18 mcg total) into inhaler and inhale daily.   traZODone 100 MG tablet Commonly known as: DESYREL Take 300 mg by mouth at bedtime.            Durable Medical Equipment  (From admission, onward)         Start     Ordered   04/23/19 1104  For home use only DME oxygen  Once    Question Answer Comment  Length of Need Lifetime   Mode or (Route) Nasal cannula   Liters per Minute 2   Frequency Continuous (stationary and portable oxygen unit needed)   Oxygen delivery system Gas      04/23/19 1103         No Known Allergies Discharge Instructions    Diet - low sodium heart healthy   Complete by:  As directed    Discharge instructions   Complete by: As directed    It is important that you read the given instructions as well as go over your medication list with RN to help you understand your care after this hospitalization.  Please follow-up with PCP in 1-2 weeks.  Please note that NO REFILLS for any discharge medications will be authorized once you are discharged, as it is imperative that you return to your primary care physician (or establish a relationship with a primary care physician if you do not have one) for your aftercare needs so that they can reassess your need for medications and monitor your lab values.  Please request your primary care physician to go over all Hospital Tests and Procedure/Radiological results at the follow up. Please get all Hospital records sent to your PCP by signing hospital release before you go home.    Do not take more than prescribed Pain, Sleep and Anxiety Medications.  You were cared for by a hospitalist during your hospital stay. If you have any questions about your discharge medications or the care you received while you were in the hospital after you are discharged, you can call the unit @UNIT @ you were admitted to and ask to speak with the hospitalist . Ask for Hospitalist on call if the hospitalist that took care of you is not available.   Once you are discharged, your primary care physician will handle any further medical issues.  You Must read complete instructions/literature along with all the possible adverse reactions/side effects for all the Medicines you take and that have been prescribed to you. Take any new Medicines after you have completely understood and accept all the possible adverse reactions/side effects.  If you have smoked or chewed Tobacco in the last 2 yrs please STOP smoking If you drink alcohol, please safely reduce the use. Do not drive, operating heavy machinery, perform activities at heights, swimming or participation in water activities or provide baby sitting services under influence.  Wear Seat belts while driving.   Increase activity slowly   Complete by: As directed       The results of significant diagnostics from this hospitalization (including imaging, microbiology, ancillary and laboratory) are listed below for reference.    Significant Diagnostic Studies: DG Chest Portable 1 View  Result Date: 04/20/2019 CLINICAL DATA:  Shortness of breath EXAM: PORTABLE CHEST 1 VIEW COMPARISON:  April 02, 2019 FINDINGS: The heart size and mediastinal contours are within normal limits. Mildly hazy airspace opacity again noted at the right lung base. No large airspace consolidation or pleural effusion. No acute osseous abnormality. IMPRESSION: Mildly hazy airspace opacity at the right lung base which could be due to atelectasis and/or early infectious  etiology. Electronically Signed   By: April 04, 2019 M.D.   On: 04/20/2019 02:42   DG Chest Portable 1 View  Result Date: 04/02/2019 CLINICAL DATA:  70 year old who was diagnosed with pneumonia 2 weeks ago in has completed antibiotic therapy, presenting with persistent cough. Current smoker. EXAM: PORTABLE CHEST 1 VIEW COMPARISON:  None. FINDINGS: Cardiac silhouette normal in size. Mild to moderate central peribronchial thickening. Lungs otherwise clear. No localized airspace consolidation. No pleural effusions. No pneumothorax. Normal pulmonary vascularity. IMPRESSION: Mild-to-moderate changes of bronchitis and/or asthma without focal airspace pneumonia. Electronically Signed   By: 80 M.D.   On: 04/02/2019 12:38   ECHOCARDIOGRAM COMPLETE  Result Date: 04/05/2019    ECHOCARDIOGRAM REPORT   Patient Name:   TAMARIUS ROSENFIELD  Date of Exam: 04/05/2019 Medical Rec #:  [id number]     Height:       69.0 in Accession #:    [id number]    Weight:       171.0 lb Date of Birth:  11/19/1950      BSA:          1.933 m Patient Age:    68 years      BP:           155/99 mmHg Patient Gender: M             HR:           64 bpm. Exam Location:  [facility] Procedure: 2D Echo, Color Doppler, Cardiac Doppler and Intracardiac            Opacification Agent Indications:     R94.31 Abnormal ECG  History:         Patient has no prior history of Echocardiogram examinations.                  Risk Factors:Former Smoker.  Sonographer:     Humphrey RollsJoan Heiss RDCS (AE) Referring Phys:  [id number] Eddie NorthNISHANT DHUNGEL Diagnosing Phys: Cristal Deerhristopher End MD  Sonographer Comments: Suboptimal apical window and suboptimal subcostal window. IMPRESSIONS  1. Left ventricular ejection fraction, by estimation, is 55 to 60%. The left ventricle has normal function. The left ventricle has no regional wall motion abnormalities. Left ventricular diastolic parameters were normal.  2. Right ventricular systolic function is normal. The right ventricular size is normal. Tricuspid  regurgitation signal is inadequate for assessing PA pressure.  3. The mitral valve is grossly normal. No evidence of mitral valve regurgitation. No evidence of mitral stenosis.  4. The aortic valve is tricuspid. Aortic valve regurgitation is not visualized. No aortic stenosis is present.  5. The inferior vena cava is normal in size with greater than 50% respiratory variability, suggesting right atrial pressure of 3 mmHg. FINDINGS  Left Ventricle: Left ventricular ejection fraction, by estimation, is 55 to 60%. The left ventricle has normal function. The left ventricle has no regional wall motion abnormalities. Definity contrast agent was given IV to delineate the left ventricular  endocardial borders. The left ventricular internal cavity size was normal in size. There is no left ventricular hypertrophy. Left ventricular diastolic parameters were normal. Right Ventricle: The right ventricular size is normal. No increase in right ventricular wall thickness. Right ventricular systolic function is normal. Tricuspid regurgitation signal is inadequate for assessing PA pressure. Left Atrium: Left atrial size was normal in size. Right Atrium: Right atrial size was normal in size. Pericardium: There is no evidence of pericardial effusion. Mitral Valve: The mitral valve is grossly normal. No evidence of mitral valve regurgitation. No evidence of mitral valve stenosis. MV peak gradient, 1.6 mmHg. The mean mitral valve gradient is 1.0 mmHg. Tricuspid Valve: The tricuspid valve is not well visualized. Tricuspid valve regurgitation is not demonstrated. Aortic Valve: The aortic valve is tricuspid. . There is mild thickening of the aortic valve. Aortic valve regurgitation is not visualized. No aortic stenosis is present. There is mild thickening of the aortic valve. Aortic valve mean gradient measures 5.0 mmHg. Aortic valve peak gradient measures 8.3 mmHg. Aortic valve area, by VTI measures 1.31 cm. Pulmonic Valve: The pulmonic  valve was not well visualized. Pulmonic valve regurgitation is not visualized. No evidence of pulmonic stenosis. Aorta: The aortic root is normal in size and structure. Pulmonary Artery: The pulmonary artery is not  well seen. Venous: The inferior vena cava is normal in size with greater than 50% respiratory variability, suggesting right atrial pressure of 3 mmHg. IAS/Shunts: The interatrial septum was not well visualized.  LEFT VENTRICLE PLAX 2D LVIDd:         4.40 cm  Diastology LVIDs:         2.88 cm  LV e' medial:   7.94 cm/s LV PW:         1.04 cm  LV E/e' medial: 8.1 LV IVS:        0.75 cm LVOT diam:     2.10 cm LV SV:         42 LV SV Index:   22 LVOT Area:     3.46 cm  LEFT ATRIUM         Index      RIGHT ATRIUM           Index LA diam:    2.80 cm 1.45 cm/m RA Area:     14.30 cm 7.40 cm/m  AORTIC VALVE                    PULMONIC VALVE AV Area (Vmax):    1.47 cm     PV Vmax:       1.09 m/s AV Area (Vmean):   1.26 cm     PV Vmean:      68.500 cm/s AV Area (VTI):     1.31 cm     PV VTI:        0.185 m AV Vmax:           144.00 cm/s  PV Peak grad:  4.8 mmHg AV Vmean:          108.000 cm/s PV Mean grad:  2.0 mmHg AV VTI:            0.321 m AV Peak Grad:      8.3 mmHg AV Mean Grad:      5.0 mmHg LVOT Vmax:         61.30 cm/s LVOT Vmean:        39.300 cm/s LVOT VTI:          0.121 m LVOT/AV VTI ratio: 0.38  AORTA Ao Root diam: 3.30 cm MITRAL VALVE MV Area (PHT): 2.66 cm    SHUNTS MV Peak grad:  1.6 mmHg    Systemic VTI:  0.12 m MV Mean grad:  1.0 mmHg    Systemic Diam: 2.10 cm MV Vmax:       0.64 m/s MV Vmean:      38.8 cm/s MV Decel Time: 285 msec MV E velocity: 64.50 cm/s MV A velocity: 34.00 cm/s MV E/A ratio:  1.90 Harrell Gave End MD Electronically signed by Nelva Bush MD Signature Date/Time: 04/05/2019/12:29:20 PM    Final     Microbiology: Recent Results (from the past 240 hour(s))  Blood Culture (routine x 2)     Status: None (Preliminary result)   Collection Time: 04/20/19  2:44 AM    Specimen: BLOOD  Result Value Ref Range Status   Specimen Description BLOOD LEFT ANTECUBITAL  Final   Special Requests   Final    BOTTLES DRAWN AEROBIC AND ANAEROBIC Blood Culture results may not be optimal due to an excessive volume of blood received in culture bottles   Culture   Final    NO GROWTH 3 DAYS Performed at [facility], [address], [place], [place] [address]  Report Status PENDING  Incomplete  Blood Culture (routine x 2)     Status: None (Preliminary result)   Collection Time: 04/20/19  2:49 AM   Specimen: BLOOD  Result Value Ref Range Status   Specimen Description BLOOD BLOOD RIGHT WRIST  Final   Special Requests   Final    BOTTLES DRAWN AEROBIC AND ANAEROBIC Blood Culture results may not be optimal due to an excessive volume of blood received in culture bottles   Culture   Final    NO GROWTH 3 DAYS Performed at [facility], [address], [place], [place] [address]    Report Status PENDING  Incomplete  Respiratory Panel by RT PCR (Flu A&B, Covid) - Nasopharyngeal Swab     Status: None   Collection Time: 04/20/19  2:49 AM   Specimen: Nasopharyngeal Swab  Result Value Ref Range Status   SARS Coronavirus 2 by RT PCR NEGATIVE NEGATIVE Final    Comment: (NOTE) SARS-CoV-2 target nucleic acids are NOT DETECTED. The SARS-CoV-2 RNA is generally detectable in upper respiratoy specimens during the acute phase of infection. The lowest concentration of SARS-CoV-2 viral copies this assay can detect is 131 copies/mL. A negative result does not preclude SARS-Cov-2 infection and should not be used as the sole basis for treatment or other patient management decisions. A negative result may occur with  improper specimen collection/handling, submission of specimen other than nasopharyngeal swab, presence of viral mutation(s) within the areas targeted by this assay, and inadequate number of viral copies (<131 copies/mL). A negative result must be combined  with clinical observations, patient history, and epidemiological information. The expected result is Negative. Fact Sheet for Patients:  https://www.moore.com/ Fact Sheet for Healthcare Providers:  https://www.young.biz/ This test is not yet ap proved or cleared by the [place] FDA and  has been authorized for detection and/or diagnosis of SARS-CoV-2 by FDA under an Emergency Use Authorization (EUA). This EUA will remain  in effect (meaning this test can be used) for the duration of the COVID-19 declaration under Section 564(b)(1) of the Act, 21 U.S.C. section 360bbb-3(b)(1), unless the authorization is terminated or revoked sooner.    Influenza A by PCR NEGATIVE NEGATIVE Final   Influenza B by PCR NEGATIVE NEGATIVE Final    Comment: (NOTE) The Xpert Xpress SARS-CoV-2/FLU/RSV assay is intended as an aid in  the diagnosis of influenza from Nasopharyngeal swab specimens and  should not be used as a sole basis for treatment. Nasal washings and  aspirates are unacceptable for Xpert Xpress SARS-CoV-2/FLU/RSV  testing. Fact Sheet for Patients: https://www.moore.com/ Fact Sheet for Healthcare Providers: https://www.young.biz/ This test is not yet approved or cleared by the [place] FDA and  has been authorized for detection and/or diagnosis of SARS-CoV-2 by  FDA under an Emergency Use Authorization (EUA). This EUA will remain  in effect (meaning this test can be used) for the duration of the  Covid-19 declaration under Section 564(b)(1) of the Act, 21  U.S.C. section 360bbb-3(b)(1), unless the authorization is  terminated or revoked. Performed at [facility], [address] [place], [place] [address]   Culture, sputum-assessment     Status: None   Collection Time: 04/20/19  3:32 PM   Specimen: Sputum  Result Value Ref Range Status   Specimen Description SPUTUM  Final   Special Requests  NONE  Final   Sputum evaluation   Final    Sputum specimen not acceptable for testing.  Please recollect.   SPOKE TO [facility] ALLISON  04/20/19 @ 1659  MLK Performed at [facility], [address], [place], [place] [address]    Report Status 04/20/2019 FINAL  Final  Urine Culture     Status: None   Collection Time: 04/20/19  3:32 PM   Specimen: Urine, Random  Result Value Ref Range Status   Specimen Description   Final    URINE, RANDOM Performed at [facility], [address], [place], [place] [address]    Special Requests   Final    NONE Performed at [facility], [address], [place], [place] [address]    Culture   Final    NO GROWTH Performed at [facility], [address] [place], [place] [address]    Report Status 04/21/2019 FINAL  Final     Labs: CBC: Recent Labs  Lab 04/20/19 0227 04/22/19 0531  WBC 13.9* 14.6*  NEUTROABS 9.8*  --   HGB 14.8 14.3  HCT 44.7 43.3  MCV 87.8 88.5  PLT 136* 183   Basic Metabolic Panel: Recent Labs  Lab 04/20/19 0227 04/22/19 0531 04/23/19 0602  NA 140  --  137  K 3.4*  --  4.1  CL 106  --  103  CO2 24  --  25  GLUCOSE 122*  --  106*  BUN 8  --  20  CREATININE 0.91 0.85 0.97  CALCIUM 8.8*  --  9.0  MG 2.1  --  2.3   Liver Function Tests: Recent Labs  Lab 04/20/19 0227  AST 18  ALT 19  ALKPHOS 63  BILITOT 0.6  PROT 6.7  ALBUMIN 3.9   No results for input(s): LIPASE, AMYLASE in the last 168 hours. No results for input(s): AMMONIA in the last 168 hours. Cardiac Enzymes: No results for input(s): CKTOTAL, CKMB, CKMBINDEX, TROPONINI in the last 168 hours. BNP (last 3 results) Recent Labs    04/02/19 1157  BNP 132.0*   CBG: No results for input(s): GLUCAP in the last 168 hours.  Time spent: 35 minutes  Signed:  Lynden Oxford  [facility] 04/23/2019 6:21 PM

## 2019-04-23 NOTE — TOC Transition Note (Signed)
Transition of Care [facility]) - CM/SW Discharge Note   Patient Details  Name: Adrian Gillespie MRN: [id number] Date of Birth: [birth year]  [facility]) CM/SW Contact:  Collie Siad, RN Phone Number: 04/23/2019, 11:19 AM   Clinical Narrative:     O2 sent to [employer] after speaking with patient. His partner will provide transportation to home after supplemental O2 is delivered to bedside. I have not been about to arrange home health services due to [facility] Medicare contracting with agency and "staffing shortages" per Home health.    Final next level of care: Home/Self Care Barriers to Discharge: Equipment Delay   Patient Goals and CMS Choice   CMS Medicare.gov Compare Post Acute Care list provided to:: Patient Choice offered to / list presented to : Patient, Spouse  Discharge Placement                       Discharge Plan and Services                DME Arranged: Oxygen DME Agency: [facility] Date DME Agency Contacted: 04/23/19 Time DME Agency Contacted: 1117 Representative spoke with at DME Agency: Nida Boatman            Social Determinants of Health (SDOH) Interventions     Readmission Risk Interventions No flowsheet data found.

## 2019-04-25 LAB — CULTURE, BLOOD (ROUTINE X 2)
Culture: NO GROWTH
Culture: NO GROWTH

## 2019-05-23 ENCOUNTER — Other Ambulatory Visit: Payer: Self-pay

## 2019-05-23 ENCOUNTER — Inpatient Hospital Stay
Admission: EM | Admit: 2019-05-23 | Discharge: 2019-05-26 | DRG: 190 | Disposition: A | Discharge status: Home / Self Care | Payer: Medicare Other | Attending: Internal Medicine | Admitting: Internal Medicine

## 2019-05-23 ENCOUNTER — Emergency Department: Payer: Medicare Other

## 2019-05-23 ENCOUNTER — Encounter: Payer: Self-pay | Admitting: Emergency Medicine

## 2019-05-23 DIAGNOSIS — I480 Paroxysmal atrial fibrillation: Secondary | ICD-10-CM | POA: Diagnosis present

## 2019-05-23 DIAGNOSIS — E440 Moderate protein-calorie malnutrition: Secondary | ICD-10-CM | POA: Diagnosis present

## 2019-05-23 DIAGNOSIS — Z6824 Body mass index (BMI) 24.0-24.9, adult: Secondary | ICD-10-CM

## 2019-05-23 DIAGNOSIS — J441 Chronic obstructive pulmonary disease with (acute) exacerbation: Secondary | ICD-10-CM | POA: Diagnosis not present

## 2019-05-23 DIAGNOSIS — F209 Schizophrenia, unspecified: Secondary | ICD-10-CM | POA: Diagnosis present

## 2019-05-23 DIAGNOSIS — R7303 Prediabetes: Secondary | ICD-10-CM | POA: Diagnosis present

## 2019-05-23 DIAGNOSIS — B181 Chronic viral hepatitis B without delta-agent: Secondary | ICD-10-CM | POA: Diagnosis present

## 2019-05-23 DIAGNOSIS — F419 Anxiety disorder, unspecified: Secondary | ICD-10-CM | POA: Diagnosis present

## 2019-05-23 DIAGNOSIS — F329 Major depressive disorder, single episode, unspecified: Secondary | ICD-10-CM | POA: Diagnosis present

## 2019-05-23 DIAGNOSIS — Z20822 Contact with and (suspected) exposure to covid-19: Secondary | ICD-10-CM | POA: Diagnosis present

## 2019-05-23 DIAGNOSIS — J9601 Acute respiratory failure with hypoxia: Secondary | ICD-10-CM | POA: Diagnosis present

## 2019-05-23 DIAGNOSIS — I493 Ventricular premature depolarization: Secondary | ICD-10-CM | POA: Diagnosis not present

## 2019-05-23 DIAGNOSIS — E785 Hyperlipidemia, unspecified: Secondary | ICD-10-CM | POA: Diagnosis present

## 2019-05-23 DIAGNOSIS — Z7901 Long term (current) use of anticoagulants: Secondary | ICD-10-CM

## 2019-05-23 DIAGNOSIS — I10 Essential (primary) hypertension: Secondary | ICD-10-CM | POA: Diagnosis present

## 2019-05-23 DIAGNOSIS — Z79899 Other long term (current) drug therapy: Secondary | ICD-10-CM

## 2019-05-23 DIAGNOSIS — J449 Chronic obstructive pulmonary disease, unspecified: Secondary | ICD-10-CM | POA: Diagnosis present

## 2019-05-23 DIAGNOSIS — F17210 Nicotine dependence, cigarettes, uncomplicated: Secondary | ICD-10-CM | POA: Diagnosis present

## 2019-05-23 DIAGNOSIS — A419 Sepsis, unspecified organism: Secondary | ICD-10-CM

## 2019-05-23 DIAGNOSIS — K219 Gastro-esophageal reflux disease without esophagitis: Secondary | ICD-10-CM | POA: Diagnosis present

## 2019-05-23 DIAGNOSIS — Z79891 Long term (current) use of opiate analgesic: Secondary | ICD-10-CM

## 2019-05-23 DIAGNOSIS — E876 Hypokalemia: Secondary | ICD-10-CM | POA: Diagnosis present

## 2019-05-23 DIAGNOSIS — Z716 Tobacco abuse counseling: Secondary | ICD-10-CM

## 2019-05-23 DIAGNOSIS — R739 Hyperglycemia, unspecified: Secondary | ICD-10-CM | POA: Diagnosis present

## 2019-05-23 LAB — CBC WITH DIFFERENTIAL/PLATELET
Abs Immature Granulocytes: 0.03 10*3/uL (ref 0.00–0.07)
Basophils Absolute: 0.1 10*3/uL (ref 0.0–0.1)
Basophils Relative: 1 %
Eosinophils Absolute: 0.5 10*3/uL (ref 0.0–0.5)
Eosinophils Relative: 5 %
HCT: 48.1 % (ref 39.0–52.0)
Hemoglobin: 16.3 g/dL (ref 13.0–17.0)
Immature Granulocytes: 0 %
Lymphocytes Relative: 28 %
Lymphs Abs: 3 10*3/uL (ref 0.7–4.0)
MCH: 29.2 pg (ref 26.0–34.0)
MCHC: 33.9 g/dL (ref 30.0–36.0)
MCV: 86.2 fL (ref 80.0–100.0)
Monocytes Absolute: 1 10*3/uL (ref 0.1–1.0)
Monocytes Relative: 9 %
Neutro Abs: 6.1 10*3/uL (ref 1.7–7.7)
Neutrophils Relative %: 57 %
Platelets: 235 10*3/uL (ref 150–400)
RBC: 5.58 MIL/uL (ref 4.22–5.81)
RDW: 13 % (ref 11.5–15.5)
WBC: 10.6 10*3/uL — ABNORMAL HIGH (ref 4.0–10.5)
nRBC: 0 % (ref 0.0–0.2)

## 2019-05-23 LAB — LACTIC ACID, PLASMA: Lactic Acid, Venous: 1.9 mmol/L (ref 0.5–1.9)

## 2019-05-23 LAB — COMPREHENSIVE METABOLIC PANEL
ALT: 16 U/L (ref 0–44)
AST: 16 U/L (ref 15–41)
Albumin: 4.3 g/dL (ref 3.5–5.0)
Alkaline Phosphatase: 71 U/L (ref 38–126)
Anion gap: 9 (ref 5–15)
BUN: 14 mg/dL (ref 8–23)
CO2: 23 mmol/L (ref 22–32)
Calcium: 9.1 mg/dL (ref 8.9–10.3)
Chloride: 105 mmol/L (ref 98–111)
Creatinine, Ser: 1.26 mg/dL — ABNORMAL HIGH (ref 0.61–1.24)
GFR calc Af Amer: >60 mL/min (ref >60)
GFR calc non Af Amer: 59 mL/min — ABNORMAL LOW (ref >60)
Glucose, Bld: 146 mg/dL — ABNORMAL HIGH (ref 70–99)
Potassium: 3.6 mmol/L (ref 3.5–5.1)
Sodium: 137 mmol/L (ref 135–145)
Total Bilirubin: 0.7 mg/dL (ref 0.3–1.2)
Total Protein: 7.2 g/dL (ref 6.5–8.1)

## 2019-05-23 LAB — PROTIME-INR
INR: 1 (ref 0.8–1.2)
Prothrombin Time: 13 seconds (ref 11.4–15.2)

## 2019-05-23 MED ORDER — SODIUM CHLORIDE 0.9 % IV BOLUS
500.0000 mL | Freq: Once | INTRAVENOUS | Status: AC
  Administered 2019-05-23: 500 mL via INTRAVENOUS

## 2019-05-23 MED ORDER — METHYLPREDNISOLONE SODIUM SUCC 125 MG IJ SOLR
125.0000 mg | Freq: Once | INTRAMUSCULAR | Status: AC
  Administered 2019-05-23: 125 mg via INTRAVENOUS
  Filled 2019-05-23: qty 2

## 2019-05-23 NOTE — ED Triage Notes (Signed)
Pt arrived from home via [facility], reports SOB, coughing. Pt 88% on RA, placed on 4L O2 and SpO2 increased to 93%. Given duoneb and 125 solumedrol en route. Pt discharged Monday with pneumonitis, not done well w antibiotics, follow up doc appt removed from chronic oxygen.

## 2019-05-24 DIAGNOSIS — Z7901 Long term (current) use of anticoagulants: Secondary | ICD-10-CM | POA: Diagnosis not present

## 2019-05-24 DIAGNOSIS — R739 Hyperglycemia, unspecified: Secondary | ICD-10-CM | POA: Diagnosis present

## 2019-05-24 DIAGNOSIS — K219 Gastro-esophageal reflux disease without esophagitis: Secondary | ICD-10-CM | POA: Diagnosis present

## 2019-05-24 DIAGNOSIS — J441 Chronic obstructive pulmonary disease with (acute) exacerbation: Principal | ICD-10-CM

## 2019-05-24 DIAGNOSIS — F419 Anxiety disorder, unspecified: Secondary | ICD-10-CM | POA: Diagnosis present

## 2019-05-24 DIAGNOSIS — I480 Paroxysmal atrial fibrillation: Secondary | ICD-10-CM

## 2019-05-24 DIAGNOSIS — Z6824 Body mass index (BMI) 24.0-24.9, adult: Secondary | ICD-10-CM | POA: Diagnosis not present

## 2019-05-24 DIAGNOSIS — Z79899 Other long term (current) drug therapy: Secondary | ICD-10-CM | POA: Diagnosis not present

## 2019-05-24 DIAGNOSIS — J9601 Acute respiratory failure with hypoxia: Secondary | ICD-10-CM

## 2019-05-24 DIAGNOSIS — F209 Schizophrenia, unspecified: Secondary | ICD-10-CM | POA: Diagnosis present

## 2019-05-24 DIAGNOSIS — I493 Ventricular premature depolarization: Secondary | ICD-10-CM | POA: Diagnosis not present

## 2019-05-24 DIAGNOSIS — I10 Essential (primary) hypertension: Secondary | ICD-10-CM

## 2019-05-24 DIAGNOSIS — Z20822 Contact with and (suspected) exposure to covid-19: Secondary | ICD-10-CM | POA: Diagnosis present

## 2019-05-24 DIAGNOSIS — R7303 Prediabetes: Secondary | ICD-10-CM | POA: Diagnosis present

## 2019-05-24 DIAGNOSIS — J209 Acute bronchitis, unspecified: Secondary | ICD-10-CM

## 2019-05-24 DIAGNOSIS — E440 Moderate protein-calorie malnutrition: Secondary | ICD-10-CM | POA: Diagnosis present

## 2019-05-24 DIAGNOSIS — F17210 Nicotine dependence, cigarettes, uncomplicated: Secondary | ICD-10-CM | POA: Diagnosis present

## 2019-05-24 DIAGNOSIS — Z79891 Long term (current) use of opiate analgesic: Secondary | ICD-10-CM | POA: Diagnosis not present

## 2019-05-24 DIAGNOSIS — Z716 Tobacco abuse counseling: Secondary | ICD-10-CM | POA: Diagnosis not present

## 2019-05-24 DIAGNOSIS — E785 Hyperlipidemia, unspecified: Secondary | ICD-10-CM | POA: Diagnosis present

## 2019-05-24 DIAGNOSIS — B181 Chronic viral hepatitis B without delta-agent: Secondary | ICD-10-CM | POA: Diagnosis present

## 2019-05-24 DIAGNOSIS — J440 Chronic obstructive pulmonary disease with acute lower respiratory infection: Secondary | ICD-10-CM | POA: Diagnosis not present

## 2019-05-24 DIAGNOSIS — F329 Major depressive disorder, single episode, unspecified: Secondary | ICD-10-CM | POA: Diagnosis present

## 2019-05-24 DIAGNOSIS — E876 Hypokalemia: Secondary | ICD-10-CM | POA: Diagnosis present

## 2019-05-24 DIAGNOSIS — F17200 Nicotine dependence, unspecified, uncomplicated: Secondary | ICD-10-CM

## 2019-05-24 LAB — URINALYSIS, COMPLETE (UACMP) WITH MICROSCOPIC
Bacteria, UA: NONE SEEN
Bilirubin Urine: NEGATIVE
Glucose, UA: NEGATIVE mg/dL
Hgb urine dipstick: NEGATIVE
Ketones, ur: NEGATIVE mg/dL
Leukocytes,Ua: NEGATIVE
Nitrite: NEGATIVE
Protein, ur: NEGATIVE mg/dL
Specific Gravity, Urine: 1.01 (ref 1.005–1.030)
Squamous Epithelial / HPF: NONE SEEN (ref 0–5)
pH: 6 (ref 5.0–8.0)

## 2019-05-24 LAB — CBC
HCT: 41.2 % (ref 39.0–52.0)
Hemoglobin: 13.9 g/dL (ref 13.0–17.0)
MCH: 29.1 pg (ref 26.0–34.0)
MCHC: 33.7 g/dL (ref 30.0–36.0)
MCV: 86.2 fL (ref 80.0–100.0)
Platelets: 210 10*3/uL (ref 150–400)
RBC: 4.78 MIL/uL (ref 4.22–5.81)
RDW: 12.9 % (ref 11.5–15.5)
WBC: 5.8 10*3/uL (ref 4.0–10.5)
nRBC: 0 % (ref 0.0–0.2)

## 2019-05-24 LAB — HEMOGLOBIN A1C
Hgb A1c MFr Bld: 5.8 % — ABNORMAL HIGH (ref 4.8–5.6)
Mean Plasma Glucose: 119.76 mg/dL

## 2019-05-24 LAB — BASIC METABOLIC PANEL
Anion gap: 9 (ref 5–15)
BUN: 15 mg/dL (ref 8–23)
CO2: 22 mmol/L (ref 22–32)
Calcium: 8.5 mg/dL — ABNORMAL LOW (ref 8.9–10.3)
Chloride: 106 mmol/L (ref 98–111)
Creatinine, Ser: 1.17 mg/dL (ref 0.61–1.24)
GFR calc Af Amer: >60 mL/min (ref >60)
GFR calc non Af Amer: >60 mL/min (ref >60)
Glucose, Bld: 313 mg/dL — ABNORMAL HIGH (ref 70–99)
Potassium: 3.6 mmol/L (ref 3.5–5.1)
Sodium: 137 mmol/L (ref 135–145)

## 2019-05-24 LAB — GLUCOSE, CAPILLARY
Glucose-Capillary: 245 mg/dL — ABNORMAL HIGH (ref 70–99)
Glucose-Capillary: 152 mg/dL — ABNORMAL HIGH (ref 70–99)
Glucose-Capillary: 152 mg/dL — ABNORMAL HIGH (ref 70–99)

## 2019-05-24 LAB — BLOOD GAS, VENOUS
Acid-base deficit: 0.5 mmol/L (ref 0.0–2.0)
Bicarbonate: 24.2 mmol/L (ref 20.0–28.0)
O2 Saturation: 90.6 %
Patient temperature: 37
pCO2, Ven: 39 mmHg — ABNORMAL LOW (ref 44.0–60.0)
pH, Ven: 7.4 (ref 7.250–7.430)
pO2, Ven: 60 mmHg — ABNORMAL HIGH (ref 32.0–45.0)

## 2019-05-24 LAB — RESPIRATORY PANEL BY RT PCR (FLU A&B, COVID)
Influenza A by PCR: NEGATIVE
Influenza B by PCR: NEGATIVE
SARS Coronavirus 2 by RT PCR: NEGATIVE

## 2019-05-24 LAB — PROCALCITONIN: Procalcitonin: <0.1 ng/mL

## 2019-05-24 LAB — LACTIC ACID, PLASMA: Lactic Acid, Venous: 1.6 mmol/L (ref 0.5–1.9)

## 2019-05-24 MED ORDER — ACETAMINOPHEN 650 MG RE SUPP: 650.0000 mg | Freq: Four times a day (QID) | RECTAL | Status: DC | PRN

## 2019-05-24 MED ORDER — DILTIAZEM HCL ER COATED BEADS 120 MG PO CP24
120.0000 mg | ORAL_CAPSULE | Freq: Every day | ORAL | Status: DC
  Administered 2019-05-24 – 2019-05-26 (×3): 120 mg via ORAL
  Filled 2019-05-24 (×3): qty 1

## 2019-05-24 MED ORDER — INSULIN ASPART 100 UNIT/ML ~~LOC~~ SOLN
0.0000 [IU] | Freq: Three times a day (TID) | SUBCUTANEOUS | Status: DC
  Administered 2019-05-24: 18:00:00 3 [IU] via SUBCUTANEOUS
  Administered 2019-05-24: 12:00:00 5 [IU] via SUBCUTANEOUS
  Administered 2019-05-25: 18:00:00 2 [IU] via SUBCUTANEOUS
  Administered 2019-05-25: 3 [IU] via SUBCUTANEOUS
  Administered 2019-05-25 – 2019-05-26 (×3): 2 [IU] via SUBCUTANEOUS
  Filled 2019-05-24 (×7): qty 1

## 2019-05-24 MED ORDER — IPRATROPIUM-ALBUTEROL 0.5-2.5 (3) MG/3ML IN SOLN
3.0000 mL | Freq: Once | RESPIRATORY_TRACT | Status: AC
  Administered 2019-05-24: 3 mL via RESPIRATORY_TRACT
  Filled 2019-05-24: qty 3

## 2019-05-24 MED ORDER — TIOTROPIUM BROMIDE MONOHYDRATE 18 MCG IN CAPS
18.0000 ug | ORAL_CAPSULE | Freq: Every day | RESPIRATORY_TRACT | Status: DC
  Administered 2019-05-24 – 2019-05-26 (×3): 18 ug via RESPIRATORY_TRACT
  Filled 2019-05-24: qty 5

## 2019-05-24 MED ORDER — APIXABAN 5 MG PO TABS
5.0000 mg | ORAL_TABLET | Freq: Two times a day (BID) | ORAL | Status: DC
  Administered 2019-05-24 – 2019-05-26 (×5): 5 mg via ORAL
  Filled 2019-05-24 (×5): qty 1

## 2019-05-24 MED ORDER — CLOPIDOGREL BISULFATE 75 MG PO TABS: 75.0000 mg | ORAL_TABLET | Freq: Every day | ORAL | Status: DC

## 2019-05-24 MED ORDER — BENZTROPINE MESYLATE 1 MG PO TABS
1.0000 mg | ORAL_TABLET | Freq: Every day | ORAL | Status: DC
  Administered 2019-05-24 – 2019-05-26 (×3): 1 mg via ORAL
  Filled 2019-05-24 (×3): qty 1

## 2019-05-24 MED ORDER — HALOPERIDOL 5 MG PO TABS
10.0000 mg | ORAL_TABLET | Freq: Two times a day (BID) | ORAL | Status: DC
  Administered 2019-05-24 – 2019-05-26 (×5): 10 mg via ORAL
  Filled 2019-05-24 (×6): qty 2

## 2019-05-24 MED ORDER — GUAIFENESIN ER 600 MG PO TB12
600.0000 mg | ORAL_TABLET | Freq: Two times a day (BID) | ORAL | Status: DC
  Administered 2019-05-24 – 2019-05-26 (×5): 600 mg via ORAL
  Filled 2019-05-24 (×7): qty 1

## 2019-05-24 MED ORDER — NICOTINE 21 MG/24HR TD PT24
21.0000 mg | MEDICATED_PATCH | Freq: Every day | TRANSDERMAL | Status: DC
  Administered 2019-05-24 – 2019-05-26 (×3): 21 mg via TRANSDERMAL
  Filled 2019-05-24 (×3): qty 1

## 2019-05-24 MED ORDER — INSULIN ASPART 100 UNIT/ML ~~LOC~~ SOLN: 0.0000 [IU] | Freq: Every day | SUBCUTANEOUS | Status: DC

## 2019-05-24 MED ORDER — ONDANSETRON HCL 4 MG PO TABS: 4.0000 mg | ORAL_TABLET | Freq: Four times a day (QID) | ORAL | Status: DC | PRN

## 2019-05-24 MED ORDER — LABETALOL HCL 5 MG/ML IV SOLN: 20.0000 mg | INTRAVENOUS | Status: DC | PRN

## 2019-05-24 MED ORDER — SODIUM CHLORIDE 0.9 % IV SOLN: INTRAVENOUS | Status: DC

## 2019-05-24 MED ORDER — IPRATROPIUM-ALBUTEROL 0.5-2.5 (3) MG/3ML IN SOLN: 3.0000 mL | RESPIRATORY_TRACT | Status: DC | PRN

## 2019-05-24 MED ORDER — ACETAMINOPHEN 325 MG PO TABS: 650.0000 mg | ORAL_TABLET | Freq: Four times a day (QID) | ORAL | Status: DC | PRN

## 2019-05-24 MED ORDER — MAGNESIUM HYDROXIDE 400 MG/5ML PO SUSP
30.0000 mL | Freq: Every day | ORAL | Status: DC | PRN
  Filled 2019-05-24: qty 30

## 2019-05-24 MED ORDER — BENZONATATE 100 MG PO CAPS
100.0000 mg | ORAL_CAPSULE | Freq: Three times a day (TID) | ORAL | Status: DC
  Administered 2019-05-24 – 2019-05-26 (×7): 100 mg via ORAL
  Filled 2019-05-24 (×7): qty 1

## 2019-05-24 MED ORDER — HYDROXYZINE HCL 50 MG PO TABS
50.0000 mg | ORAL_TABLET | Freq: Two times a day (BID) | ORAL | Status: DC | PRN
  Administered 2019-05-25: 50 mg via ORAL
  Filled 2019-05-24 (×2): qty 1

## 2019-05-24 MED ORDER — GUAIFENESIN-DM 100-10 MG/5ML PO SYRP
5.0000 mL | ORAL_SOLUTION | ORAL | Status: DC | PRN
  Administered 2019-05-24 – 2019-05-25 (×2): 5 mL via ORAL
  Filled 2019-05-24 (×3): qty 5

## 2019-05-24 MED ORDER — SODIUM CHLORIDE 0.9 % IV SOLN
500.0000 mg | INTRAVENOUS | Status: DC
  Administered 2019-05-24 – 2019-05-25 (×2): 500 mg via INTRAVENOUS
  Filled 2019-05-24 (×3): qty 500

## 2019-05-24 MED ORDER — SODIUM CHLORIDE 0.9 % IV SOLN
1.0000 g | INTRAVENOUS | Status: DC
  Administered 2019-05-24: 04:00:00 1 g via INTRAVENOUS
  Filled 2019-05-24: qty 10

## 2019-05-24 MED ORDER — ONDANSETRON HCL 4 MG/2ML IJ SOLN: 4.0000 mg | Freq: Four times a day (QID) | INTRAMUSCULAR | Status: DC | PRN

## 2019-05-24 MED ORDER — METHYLPREDNISOLONE SODIUM SUCC 40 MG IJ SOLR
40.0000 mg | Freq: Three times a day (TID) | INTRAMUSCULAR | Status: DC
  Administered 2019-05-24 – 2019-05-26 (×7): 40 mg via INTRAVENOUS
  Filled 2019-05-24 (×7): qty 1

## 2019-05-24 MED ORDER — HYDROCOD POLST-CPM POLST ER 10-8 MG/5ML PO SUER
5.0000 mL | Freq: Two times a day (BID) | ORAL | Status: DC | PRN
  Administered 2019-05-24: 5 mL via ORAL
  Filled 2019-05-24: qty 5

## 2019-05-24 MED ORDER — PANTOPRAZOLE SODIUM 40 MG PO TBEC
40.0000 mg | DELAYED_RELEASE_TABLET | Freq: Every day | ORAL | Status: DC
  Administered 2019-05-24 – 2019-05-26 (×3): 40 mg via ORAL
  Filled 2019-05-24 (×3): qty 1

## 2019-05-24 MED ORDER — HYDROXYZINE PAMOATE 50 MG PO CAPS
50.0000 mg | ORAL_CAPSULE | Freq: Two times a day (BID) | ORAL | Status: DC | PRN
  Filled 2019-05-24: qty 1

## 2019-05-24 MED ORDER — TRAZODONE HCL 50 MG PO TABS
150.0000 mg | ORAL_TABLET | Freq: Every day | ORAL | Status: DC
  Administered 2019-05-24 – 2019-05-25 (×2): 150 mg via ORAL
  Filled 2019-05-24 (×2): qty 3

## 2019-05-24 MED ORDER — TRAZODONE HCL 50 MG PO TABS
25.0000 mg | ORAL_TABLET | Freq: Every evening | ORAL | Status: DC | PRN
  Administered 2019-05-25: 01:00:00 25 mg via ORAL
  Filled 2019-05-24: qty 1

## 2019-05-24 MED ORDER — ENOXAPARIN SODIUM 40 MG/0.4ML ~~LOC~~ SOLN: 40.0000 mg | SUBCUTANEOUS | Status: DC

## 2019-05-24 NOTE — ED Notes (Signed)
Pt given ginger ale with permission from MD York Cerise. Pt states that his SOB decreased, and pt no longer appears labored while breathing. Pt SpO2 is 95% on 3L.

## 2019-05-24 NOTE — ED Provider Notes (Signed)
[facility] Emergency Department Provider Note  ____________________________________________   First MD Initiated Contact with Patient 05/23/19 2329     (approximate)  I have reviewed the triage vital signs and the nursing notes.   HISTORY  Chief Complaint Shortness of Breath  Level 5 caveat:  history/ROS limited by acute/critical illness  HPI Adrian Gillespie is a 69 y.o. male with medical history as listed below which most notably includes severe COPD and continued tobacco use up until least a month ago according to the patient.  He presents by EMS for evaluation of severe shortness of breath and cough.  He was last discharged from this hospital about a month ago.  He was initially started on home oxygen but one of his outpatient doctors discontinued the oxygen after a few days.  He said that he was doing okay until the last few days where his breathing is gradually gotten worse including wheezing and cough.  Exertion makes his symptoms much worse and they have gotten to the point that even rest does not make him feel better.  He denies fever, sore throat, chest pain, nausea, vomiting, abdominal pain, and dysuria.  His symptoms are severe and gradual in onset.  Of note when he first arrived to the ED he was hypoxemic and unable to speak in full sentences but when I evaluated him he was able to speak in sentences while on oxygen by nasal cannula.         Past Medical History:  Diagnosis Date  . Anxiety   . 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 COPD (chronic obstructive pulmonary disease) ([place])   . Depression   . GERD (gastroesophageal reflux disease)   . Hepatitis B carrier ([place])   . HLD (hyperlipidemia)   . HTN (hypertension)   . Schizophrenia [facility])     Patient Active Problem List   Diagnosis Date Noted  . CAP (community acquired pneumonia) 04/20/2019  . PVC (premature ventricular contraction) 04/20/2019  . GERD  (gastroesophageal reflux disease) 04/20/2019  . Acute on chronic respiratory failure with hypoxia ([place])   . HTN (hypertension)   . COPD exacerbation ([place])   . NSVT (nonsustained ventricular tachycardia) ([place])   . COPD with acute bronchitis ([place]) 04/02/2019  . Acute respiratory failure with hypoxia ([place]) 04/02/2019  . Hypokalemia 04/02/2019  . Schizophrenia ([place]) 04/02/2019    History reviewed. No pertinent surgical history.  Prior to Admission medications   Medication Sig Start Date End Date Taking? Authorizing Provider  albuterol (VENTOLIN HFA) 108 (90 Base) MCG/ACT inhaler Inhale 2 puffs into the lungs every 6 (six) hours as needed for shortness of breath or wheezing. 04/05/19   Dhungel, Flonnie Overman, MD  apixaban (ELIQUIS) 5 MG TABS tablet Take 1 tablet (5 mg total) by mouth 2 (two) times daily. 04/23/19   Lavina Hamman, MD  benzonatate (TESSALON) 100 MG capsule Take 1 capsule (100 mg total) by mouth 3 (three) times daily. 04/23/19   Lavina Hamman, MD  benztropine (COGENTIN) 1 MG tablet Take 1 mg by mouth daily.    [provider]  BEVESPI AEROSPHERE 9-4.8 MCG/ACT AERO Inhale 2 puffs into the lungs at bedtime.  03/31/19   [provider]  guaiFENesin-dextromethorphan (ROBITUSSIN DM) 100-10 MG/5ML syrup Take 5 mLs by mouth every 4 (four) hours as needed for cough. 04/05/19   Dhungel, Flonnie Overman, MD  haloperidol (HALDOL) 10 MG tablet Take 10 mg by mouth 2 (two) times daily.  [provider]  hydrOXYzine (VISTARIL) 50 MG capsule Take 50 mg by mouth 2 (two) times daily as needed for anxiety.  04/14/19   [provider]  INVEGA SUSTENNA 234 MG/1.5ML SUSY injection Inject 234 mg into the muscle every 30 (thirty) days. 04/18/19   [provider]  nicotine (NICODERM CQ - DOSED IN MG/24 HOURS) 21 mg/24hr patch Place 1 patch (21 mg total) onto the skin daily. 04/23/19   Rolly Salter, MD  omeprazole (PRILOSEC) 20 MG capsule Take 20 mg by mouth daily.    [provider]  predniSONE (DELTASONE) 10 MG tablet Take 50mg  daily for 3days,Take 40mg  daily for 3days,Take 30mg  daily for 3days,Take 20mg  daily for 3days,Take 10mg  daily for 3days, then stop 04/23/19   , MD  tiotropium (SPIRIVA HANDIHALER) 18 MCG inhalation capsule Place 1 capsule (18 mcg total) into inhaler and inhale daily. 04/05/19   Dhungel, , MD  traZODone (DESYREL) 100 MG tablet Take 300 mg by mouth at bedtime.    [provider]    Allergies Patient has no known allergies.  Family History  Problem Relation Age of Onset  . Arthritis/Rheumatoid Mother     Social History Social History   Tobacco Use  . Smoking status: Current Every Day Smoker    Packs/day: 1.00    Types: Cigarettes  . Smokeless tobacco: Never Used  . Tobacco comment: quit 1 month ago  Substance Use Topics  . Alcohol use: Not on file  . Drug use: Not on file    Review of Systems Level 5 caveat:  history/ROS limited by acute/critical illness   Constitutional: No fever/chills Eyes: No visual changes. ENT: No sore throat. Cardiovascular: Denies chest pain. Respiratory: Severe respiratory distress Gastrointestinal: No abdominal pain.  No nausea, no vomiting.  No diarrhea.  No constipation. Genitourinary: Negative for dysuria. Musculoskeletal: Negative for neck pain.  Negative for back pain. Integumentary: Negative for rash. Neurological: Negative for headaches, focal weakness or numbness.   ____________________________________________   PHYSICAL EXAM:  VITAL SIGNS: ED Triage Vitals  Enc Vitals Group     BP 05/23/19 2314 111/87     Pulse Rate 05/23/19 2314 98     Resp 05/23/19 2314 (!) 28     Temp 05/23/19 2347 98.6 F (37 C)     Temp Source 05/23/19 2347 Oral     SpO2 05/23/19 2313 94 %     Weight 05/23/19 2314 74.4 kg (164 lb)     Height 05/23/19 2314 1.753 m (5\' 9" )     Head Circumference --      Peak Flow --      Pain Score 05/23/19 2314 0     Pain Loc --       Pain Edu? --      Excl. in GC? --     Constitutional: Alert and oriented.  Moderate respiratory distress. Eyes: Conjunctivae are normal.  Head: Atraumatic. Nose: No congestion/rhinnorhea. Mouth/Throat: Patient is wearing a mask. Neck: No stridor.  No meningeal signs.   Cardiovascular: Tachycardia, regular rhythm. Good peripheral circulation. Grossly normal heart sounds. Respiratory: Increased respiratory effort with minimal intercostal muscle usage and retractions.  Coarse breath sounds throughout lung fields with expiratory wheezing. Gastrointestinal: Soft and nontender. No distention.  Musculoskeletal: No lower extremity tenderness nor edema. No gross deformities of extremities. Neurologic:  Normal speech and language. No gross focal neurologic deficits are appreciated.  Skin:  Skin is warm, dry and intact. Psychiatric: Mood and affect are  normal. Speech and behavior are normal.  ____________________________________________   LABS (all labs ordered are listed, but only abnormal results are displayed)  Labs Reviewed  COMPREHENSIVE METABOLIC PANEL - Abnormal; Notable for the following components:      Result Value   Glucose, Bld 146 (*)    Creatinine, Ser 1.26 (*)    GFR calc non Af Amer 59 (*)    All other components within normal limits  CBC WITH DIFFERENTIAL/PLATELET - Abnormal; Notable for the following components:   WBC 10.6 (*)    All other components within normal limits  URINALYSIS, COMPLETE (UACMP) WITH MICROSCOPIC - Abnormal; Notable for the following components:   Color, Urine YELLOW (*)    APPearance CLEAR (*)    All other components within normal limits  BLOOD GAS, VENOUS - Abnormal; Notable for the following components:   pCO2, Ven 39 (*)    pO2, Ven 60.0 (*)    All other components within normal limits  RESPIRATORY PANEL BY RT PCR (FLU A&B, COVID)  CULTURE, BLOOD (ROUTINE X 2)  CULTURE, BLOOD (ROUTINE X 2)  LACTIC ACID, PLASMA  PROTIME-INR  LACTIC  ACID, PLASMA  PROCALCITONIN   ____________________________________________  EKG  ED ECG REPORT I, Loleta Rose, the attending physician, personally viewed and interpreted this ECG.  Date: 05/23/2019 EKG Time: 23: 15 Rate: 138 Rhythm: Sinus tachycardia but with an irregular rate QRS Axis: normal Intervals: normal ST/T Wave abnormalities: Non-specific ST segment / T-wave changes, but no clear evidence of acute ischemia. Narrative Interpretation: no definitive evidence of acute ischemia; does not meet STEMI criteria.   ____________________________________________  RADIOLOGY I, Loleta Rose, personally viewed and evaluated these images (plain radiographs) as part of my medical decision making, as well as reviewing the written report by the radiologist.  ED MD interpretation: No focal abnormality, chronic COPD  Official radiology report(s): DG Chest Port 1 View  Result Date: 05/23/2019 CLINICAL DATA:  Shortness of breath and cough EXAM: PORTABLE CHEST 1 VIEW COMPARISON:  04/20/2019 FINDINGS: Cardiac shadow is within normal limits. The lungs are well aerated bilaterally. No focal infiltrate or sizable effusion is seen. No bony abnormality is noted. IMPRESSION: No acute abnormality noted. Electronically Signed   By: Alcide Clever M.D.   On: 05/23/2019 23:31    ____________________________________________   PROCEDURES   Procedure(s) performed (including Critical Care):  .Critical Care Performed by: Loleta Rose, MD Authorized by: Loleta Rose, MD   Critical care provider statement:    Critical care time (minutes):  30   Critical care time was exclusive of:  Separately billable procedures and treating other patients   Critical care was necessary to treat or prevent imminent or life-threatening deterioration of the following conditions:  Respiratory failure   Critical care was time spent personally by me on the following activities:  Development of treatment plan with patient  or surrogate, discussions with consultants, evaluation of patient's response to treatment, examination of patient, obtaining history from patient or surrogate, ordering and performing treatments and interventions, ordering and review of laboratory studies, ordering and review of radiographic studies, pulse oximetry, re-evaluation of patient's condition and review of old charts  .1-3 Lead EKG Interpretation Performed by: Loleta Rose, MD Authorized by: Loleta Rose, MD     Interpretation: abnormal     ECG rate:  110   ECG rate assessment: tachycardic     Rhythm: sinus tachycardia     Ectopy: none     Conduction: normal       ____________________________________________  INITIAL IMPRESSION / MDM / ASSESSMENT AND PLAN / ED COURSE  As part of my medical decision making, I reviewed the following data within the electronic MEDICAL RECORD NUMBER Nursing notes reviewed and incorporated, Labs reviewed , EKG interpreted , Old chart reviewed, Radiograph reviewed  and Notes from prior ED visits   Differential diagnosis includes, but is not limited to, COPD exacerbation, COVID-19, pneumonia, new onset heart failure.  The patient's presentation is most consistent with COPD and this is his primary issue at baseline.  He was taken off of home oxygen but he is once again hypoxemic in the ED.  His breathing improved once he got oxygen and I have ordered Solu-Medrol 125 mg IV and will order breathing treatments for him (DuoNeb's x3).  Given his acute on chronic respiratory failure with hypoxemia he will need admission to the hospital.  The patient is on the cardiac monitor to evaluate for evidence of arrhythmia and/or significant heart rate changes.     Clinical Course as of May 24 143  Tue May 24, 2019  0038 No acute abnormalities on chest x-ray.  Patient's CO2 is actually low on VBG which is reassuring that he is not retaining.  Urinalysis is unremarkable, lactic acid is within normal limits, no  significant leukocytosis, normal CMP.   [CF]  [phone] consulting hospitalist for admission   [CF]  0121 Discussed case by phone with Dr. Arville Care who will admit.   [CF]    Clinical Course User Index [CF] Loleta Rose, MD     ____________________________________________  FINAL CLINICAL IMPRESSION(S) / ED DIAGNOSES  Final diagnoses:  Acute respiratory failure with hypoxemia (HCC)  COPD exacerbation (HCC)     MEDICATIONS GIVEN DURING THIS VISIT:  Medications  sodium chloride 0.9 % bolus 500 mL (500 mLs Intravenous New Bag/Given 05/23/19 2337)  methylPREDNISolone sodium succinate (SOLU-MEDROL) 125 mg/2 mL injection 125 mg (125 mg Intravenous Given 05/23/19 2336)  ipratropium-albuterol (DUONEB) 0.5-2.5 (3) MG/3ML nebulizer solution 3 mL (3 mLs Nebulization Given 05/24/19 0128)  ipratropium-albuterol (DUONEB) 0.5-2.5 (3) MG/3ML nebulizer solution 3 mL (3 mLs Nebulization Given 05/24/19 0128)  ipratropium-albuterol (DUONEB) 0.5-2.5 (3) MG/3ML nebulizer solution 3 mL (3 mLs Nebulization Given 05/24/19 0128)     ED Discharge Orders    None      *Please note:  Adrian Gillespie was evaluated in Emergency Department on 05/24/2019 for the symptoms described in the history of present illness. He was evaluated in the context of the global COVID-19 pandemic, which necessitated consideration that the patient might be at risk for infection with the SARS-CoV-2 virus that causes COVID-19. Institutional protocols and algorithms that pertain to the evaluation of patients at risk for COVID-19 are in a state of rapid change based on information released by regulatory bodies including the CDC and federal and state organizations. These policies and algorithms were followed during the patient's care in the ED.  Some ED evaluations and interventions may be delayed as a result of limited staffing during the pandemic.*  Note:  This document was prepared using Dragon voice recognition software and may include  unintentional dictation errors.   Loleta Rose, MD 05/24/19 [phone]

## 2019-05-24 NOTE — H&P (Addendum)
[place] at [facility]   PATIENT NAME: Adrian Gillespie    MR#:  [id number]  DATE OF BIRTH:  17-Jul-1950  DATE OF ADMISSION:  05/23/2019  PRIMARY CARE PHYSICIAN: Andres Shad, MD   REQUESTING/REFERRING PHYSICIAN: Loleta Rose, MD  CHIEF COMPLAINT:   Chief Complaint  Patient presents with  . Shortness of Breath    HISTORY OF PRESENT ILLNESS:  Adrian Gillespie  is a 69 y.o. male with a known history of COPD, hypertension, dyslipidemia, schizophrenia and depression as well as paroxysmal atrial fibrillation, who presented to the emergency room with acute onset of worsening dyspnea with associated cough and wheezing, which have been worsening over the last 4 days since Friday.  He denied any fever or chills.  No chest pain or palpitations.  No nausea or vomiting or abdominal pain.  He denied any hemoptysis.  No dysuria, oliguria or hematuria or flank pain.  No bleeding diathesis.  He stated that he does not remember the last time he took Eliquis.  Upon presentation to the emergency room, heart rate was 121 respiratory rate was 28 and pulse 70 was 95% on 3 L O2 by nasal cannula.  Venous blood gas showed pH 7.4 with a bicarbonate of 24.2.  Lactic acid was 1.9 and later 1.6.  Influenza antigens and COVID-19 PCR came back negative.  UA was unremarkable.  CBC showed a WBC of 10.6 and CMP revealed a creatinine of 1.26 compared to 0.97 a month ago.  Chest x-ray showed no acute cardiopulmonary disease. EKG showed sinus tachycardia with rate 138 with poor R wave progression and prolonged QT interval with QTC of 478 MS. and occasional PVCs.  The patient was given 3 duo nebs, IV Solu-Medrol 125 mg and 500 mill IV normal saline.  He will be admitted to a medical monitored bed for further evaluation and management.   PAST MEDICAL HISTORY:   Past Medical History:  Diagnosis Date  . Anxiety   . 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 COPD (chronic obstructive  pulmonary disease) ([facility])   . Depression   . GERD (gastroesophageal reflux disease)   . 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 HLD (hyperlipidemia)   . HTN (hypertension)   . Schizophrenia ([facility])     PAST SURGICAL HISTORY:  History reviewed. No pertinent surgical history.  SOCIAL HISTORY:   Social History   Tobacco Use  . Smoking status: Current Every Day Smoker    Packs/day: 1.00    Types: Cigarettes  . Smokeless tobacco: Never Used  . Tobacco comment: quit 1 month ago  Substance Use Topics  . Alcohol use: Not on file    FAMILY HISTORY:   Family History  Problem Relation Age of Onset  . Arthritis/Rheumatoid Mother     DRUG ALLERGIES:  No Known Allergies  REVIEW OF SYSTEMS:   ROS As per history of present illness. All pertinent systems were reviewed above. Constitutional,  HEENT, cardiovascular, respiratory, GI, GU, musculoskeletal, neuro, psychiatric, endocrine,  integumentary and hematologic systems were reviewed and are otherwise  negative/unremarkable except for positive findings mentioned above in the HPI.   MEDICATIONS AT HOME:   Prior to Admission medications   Medication Sig Start Date End Date Taking? Authorizing Provider  albuterol (VENTOLIN HFA) 108 (90 Base) MCG/ACT inhaler Inhale 2 puffs into the lungs every 6 (six) hours as needed for shortness of breath or wheezing. 04/05/19   Dhungel, Theda Belfast, MD  apixaban (ELIQUIS) 5  MG TABS tablet Take 1 tablet (5 mg total) by mouth 2 (two) times daily. 04/23/19   Lavina Hamman, MD  benzonatate (TESSALON) 100 MG capsule Take 1 capsule (100 mg total) by mouth 3 (three) times daily. 04/23/19   Lavina Hamman, MD  benztropine (COGENTIN) 1 MG tablet Take 1 mg by mouth daily.    [provider]  BEVESPI AEROSPHERE 9-4.8 MCG/ACT AERO Inhale 2 puffs into the lungs at bedtime.  03/31/19   [provider]  guaiFENesin-dextromethorphan (ROBITUSSIN DM) 100-10 MG/5ML syrup Take 5 mLs by mouth every 4 (four) hours as  needed for cough. 04/05/19   Dhungel, Flonnie Overman, MD  haloperidol (HALDOL) 10 MG tablet Take 10 mg by mouth 2 (two) times daily.    [provider]  hydrOXYzine (VISTARIL) 50 MG capsule Take 50 mg by mouth 2 (two) times daily as needed for anxiety.  04/14/19   [provider]  INVEGA SUSTENNA 234 MG/1.5ML SUSY injection Inject 234 mg into the muscle every 30 (thirty) days. 04/18/19   [provider]  nicotine (NICODERM CQ - DOSED IN MG/24 HOURS) 21 mg/24hr patch Place 1 patch (21 mg total) onto the skin daily. 04/23/19   Lavina Hamman, MD  omeprazole (PRILOSEC) 20 MG capsule Take 20 mg by mouth daily.    [provider]  predniSONE (DELTASONE) 10 MG tablet Take 50mg  daily for 3days,Take 40mg  daily for 3days,Take 30mg  daily for 3days,Take 20mg  daily for 3days,Take 10mg  daily for 3days, then stop 04/23/19   Lavina Hamman, MD  tiotropium (SPIRIVA HANDIHALER) 18 MCG inhalation capsule Place 1 capsule (18 mcg total) into inhaler and inhale daily. 04/05/19   Dhungel, Flonnie Overman, MD  traZODone (DESYREL) 100 MG tablet Take 300 mg by mouth at bedtime.    [provider]      VITAL SIGNS:  Blood pressure 127/88, pulse (!) 108, temperature 98.6 F (37 C), temperature source Oral, resp. rate 19, height 5\' 9"  (1.753 m), weight 74.4 kg, SpO2 90 %.  PHYSICAL EXAMINATION:  Physical Exam  GENERAL:  69 y.o.-year-old patient lying in the bed with mild respiratory distress with conversational dyspnea.   EYES: Pupils equal, round, reactive to light and accommodation. No scleral icterus. Extraocular muscles intact.  HEENT: Head atraumatic, normocephalic. Oropharynx and nasopharynx clear.  NECK:  Supple, no jugular venous distention. No thyroid enlargement, no tenderness.  LUNGS: Diffuse expiratory wheezes with tight expiratory airflow and harsh physical breathing.   CARDIOVASCULAR: Regular rate and rhythm, S1, S2 normal. No murmurs, rubs, or gallops.  ABDOMEN: Soft,  nondistended, nontender. Bowel sounds present. No organomegaly or mass.  EXTREMITIES: No pedal edema, cyanosis, or clubbing.  NEUROLOGIC: Cranial nerves II through XII are intact. Muscle strength 5/5 in all extremities. Sensation intact. Gait not checked.  PSYCHIATRIC: The patient is alert and oriented x 3.  Normal affect and good eye contact. SKIN: No obvious rash, lesion, or ulcer.   LABORATORY PANEL:   CBC Recent Labs  Lab 05/23/19 2316  WBC 10.6*  HGB 16.3  HCT 48.1  PLT 235   ------------------------------------------------------------------------------------------------------------------  Chemistries  Recent Labs  Lab 05/23/19 2316  NA 137  K 3.6  CL 105  CO2 23  GLUCOSE 146*  BUN 14  CREATININE 1.26*  CALCIUM 9.1  AST 16  ALT 16  ALKPHOS 71  BILITOT 0.7   ------------------------------------------------------------------------------------------------------------------  Cardiac Enzymes No results for input(s): TROPONINI in the last 168 hours. ------------------------------------------------------------------------------------------------------------------  RADIOLOGY:  DG Chest Port 1 [address]  Result Date: 05/23/2019 CLINICAL DATA:  Shortness of breath and cough EXAM: PORTABLE CHEST 1 VIEW COMPARISON:  04/20/2019 FINDINGS: Cardiac shadow is within normal limits. The lungs are well aerated bilaterally. No focal infiltrate or sizable effusion is seen. No bony abnormality is noted. IMPRESSION: No acute abnormality noted. Electronically Signed   By: Alcide Clever M.D.   On: 05/23/2019 23:31      IMPRESSION AND PLAN:   1.  COPD with acute exacerbation like secondary to acute bronchitis with subsequent acute hypoxic respiratory failure. -The patient will be admitted to medical monitored bed. -We will continue steroid therapy with IV Solu-Medrol.  We will continue bronchodilator therapy with scheduled and as needed nebulized DuoNebs. -We will add IV Rocephin and  Zithromax given severity of COPD exacerbation and hypoxemia. -Mucolytics will be provided. -We will continue Spiriva  2.  Essential hypertension. -This is currently diet managed. -We will place the patient on as needed IV labetalol.  3.  Paroxysmal atrial fibrillation with rapid ventricular response. -We will utilize IV Cardizem as needed and start him on p.o. Cardizem that should help with his blood pressure. -We will restart his Eliquis  4.  Schizophrenia. -We will continue his Haldol and Cogentin.  5.  History of tobacco abuse. -He did quit smoking and was encouraged not to go back.  6.  GERD. -We will continue his PPI therapy.  7.  DVT prophylaxis. -We will place him back on Eliquis.  All the records are reviewed and case discussed with ED provider. The plan of care was discussed in details with the patient. I answered all questions. The patient agreed to proceed with the above mentioned plan. Further management will depend upon hospital course.   CODE STATUS: Full code   Status is: Inpatient  Remains inpatient appropriate because:Unsafe d/c plan, IV treatments appropriate due to intensity of illness or inability to take PO and Inpatient level of care appropriate due to severity of illness   Dispo: The patient is from: Home              Anticipated d/c is to: Home              Anticipated d/c date is: 2 days              Patient currently is not medically stable to d/c.    TOTAL TIME TAKING CARE OF THIS PATIENT: 55 minutes.    Hannah Beat M.D on 05/24/2019 at 1:55 AM  [facility]   From 7 PM-7 AM, contact night-coverage [email or web address]  CC: Primary care physician; Andres Shad, MD   Note: This dictation was prepared with Dragon dictation along with smaller phrase technology. Any transcriptional errors that result from this process are unintentional.

## 2019-05-24 NOTE — Progress Notes (Addendum)
PROGRESS NOTE    Adrian Gillespie  LXB:[id number] DOB: 03/04/1950 DOA: 05/23/2019 PCP: Andres Shad, MD      Assessment & Plan:   Active Problems:   COPD exacerbation (HCC)   COPD exacerbation: continue on IV azithromycin & rocephin. Continue on IV steroids, bronchodilators. Encourage incentive spirometry   Acute hypoxic respiratory failure: secondary to above. Continue on supplemental oxygen and wean as tolerated. Management as stated above. Pt was previously on oxygen at home but evidently the oxygen was just taken away from him about 1 week ago as per pt's sister, Alvino Chapel   Hyperglycemia: no hx of DM but significantly elevated BS so will check HbA1c. Possibly secondary to steroid use. Will start SSI w/ accuchecks   PAF: w/ RVR. Continue cardizem and eliquis  Schizophrenia: continue on haldol and cogentin.  History of tobacco abuse: quit smoking  GERD: continue on PPI    DVT prophylaxis: eliquis Code Status: full  Family Communication: discussed pt's care w/ pt's sister, Alvino Chapel, and answered her questions. Please call pt's brother, Dorene Sorrow, tomorrow at [phone] Disposition Plan: depends on PT/OT recs.    Consultants:      Procedures:    Antimicrobials: azithromycin   Subjective: Pt c/o shortness of breath   Objective: Vitals:   05/24/19 0245 05/24/19 0300 05/24/19 0322 05/24/19 0343  BP:  105/72 (!) 129/97 116/88  Pulse: 86 78 91 90  Resp: 18 (!) 21 17 18   Temp:   (!) 97.5 F (36.4 C) 98.2 F (36.8 C)  TempSrc:   Oral   SpO2: 92% 90% 96% 96%  Weight:   75.4 kg   Height:   5\' 9"  (1.753 m)     Intake/Output Summary (Last 24 hours) at 05/24/2019 0717 Last data filed at 05/24/2019 0357 Gross per 24 hour  Intake 880.11 ml  Output --  Net 880.11 ml   Filed Weights   05/23/19 2314 05/24/19 0322  Weight: 74.4 kg 75.4 kg    Examination:  General exam: Appears calm and comfortable  Respiratory system: diminished breath sounds b/l. No rales   Cardiovascular system: S1 & S2 +. No  rubs, gallops or clicks. Gastrointestinal system: Abdomen is nondistended, soft and nontender.  Normal bowel sounds heard. Central nervous system: Alert and oriented. Moves all 4 extremities  Psychiatry: Flat mood and and affect     Data Reviewed: I have personally reviewed following labs and imaging studies  CBC: Recent Labs  Lab 05/23/19 2316 05/24/19 0544  WBC 10.6* 5.8  NEUTROABS 6.1  --   HGB 16.3 13.9  HCT 48.1 41.2  MCV 86.2 86.2  PLT 235 210   Basic Metabolic Panel: Recent Labs  Lab 05/23/19 2316 05/24/19 0544  NA 137 137  K 3.6 3.6  CL 105 106  CO2 23 22  GLUCOSE 146* 313*  BUN 14 15  CREATININE 1.26* 1.17  CALCIUM 9.1 8.5*   GFR: Estimated Creatinine Clearance: 61.3 mL/min (by C-G formula based on SCr of 1.17 mg/dL). Liver Function Tests: Recent Labs  Lab 05/23/19 2316  AST 16  ALT 16  ALKPHOS 71  BILITOT 0.7  PROT 7.2  ALBUMIN 4.3   No results for input(s): LIPASE, AMYLASE in the last 168 hours. No results for input(s): AMMONIA in the last 168 hours. Coagulation Profile: Recent Labs  Lab 05/23/19 2316  INR 1.0   Cardiac Enzymes: No results for input(s): CKTOTAL, CKMB, CKMBINDEX, TROPONINI in the last 168 hours. BNP (last 3 results) No results for input(s): PROBNP in  the last 8760 hours. HbA1C: No results for input(s): HGBA1C in the last 72 hours. CBG: No results for input(s): GLUCAP in the last 168 hours. Lipid Profile: No results for input(s): CHOL, HDL, LDLCALC, TRIG, CHOLHDL, LDLDIRECT in the last 72 hours. Thyroid Function Tests: No results for input(s): TSH, T4TOTAL, FREET4, T3FREE, THYROIDAB in the last 72 hours. Anemia Panel: No results for input(s): VITAMINB12, FOLATE, FERRITIN, TIBC, IRON, RETICCTPCT in the last 72 hours. Sepsis Labs: Recent Labs  Lab 05/23/19 2316 05/23/19 2350 05/24/19 0136  PROCALCITON  --  <0.10  --   LATICACIDVEN 1.9  --  1.6    Recent Results (from the  past 240 hour(s))  Culture, blood (Routine x 2)     Status: None (Preliminary result)   Collection Time: 05/23/19 11:16 PM   Specimen: BLOOD  Result Value Ref Range Status   Specimen Description BLOOD LEFT ANTECUBITAL  Final   Special Requests   Final    BOTTLES DRAWN AEROBIC AND ANAEROBIC Blood Culture adequate volume   Culture   Final    NO GROWTH < 12 HOURS Performed at [facility], [address], [place], [place] [address]    Report Status PENDING  Incomplete  Culture, blood (Routine x 2)     Status: None (Preliminary result)   Collection Time: 05/23/19 11:16 PM   Specimen: BLOOD  Result Value Ref Range Status   Specimen Description BLOOD LEFT ANTECUBITAL  Final   Special Requests   Final    BOTTLES DRAWN AEROBIC AND ANAEROBIC Blood Culture adequate volume   Culture   Final    NO GROWTH < 12 HOURS Performed at [facility], [address] [place], [place] [address]    Report Status PENDING  Incomplete  Respiratory Panel by RT PCR (Flu A&B, Covid) - Nasopharyngeal Swab     Status: None   Collection Time: 05/23/19 11:44 PM   Specimen: Nasopharyngeal Swab  Result Value Ref Range Status   SARS Coronavirus 2 by RT PCR NEGATIVE NEGATIVE Final    Comment: (NOTE) SARS-CoV-2 target nucleic acids are NOT DETECTED. The SARS-CoV-2 RNA is generally detectable in upper respiratoy specimens during the acute phase of infection. The lowest concentration of SARS-CoV-2 viral copies this assay can detect is 131 copies/mL. A negative result does not preclude SARS-Cov-2 infection and should not be used as the sole basis for treatment or other patient management decisions. A negative result may occur with  improper specimen collection/handling, submission of specimen other than nasopharyngeal swab, presence of viral mutation(s) within the areas targeted by this assay, and inadequate number of viral copies (<131 copies/mL). A negative result must be combined with  clinical observations, patient history, and epidemiological information. The expected result is Negative. Fact Sheet for Patients:  PinkCheek.be Fact Sheet for Healthcare Providers:  GravelBags.it This test is not yet ap proved or cleared by the [place] FDA and  has been authorized for detection and/or diagnosis of SARS-CoV-2 by FDA under an Emergency Use Authorization (EUA). This EUA will remain  in effect (meaning this test can be used) for the duration of the COVID-19 declaration under Section 564(b)(1) of the Act, 21 U.S.C. section 360bbb-3(b)(1), unless the authorization is terminated or revoked sooner.    Influenza A by PCR NEGATIVE NEGATIVE Final   Influenza B by PCR NEGATIVE NEGATIVE Final    Comment: (NOTE) The Xpert Xpress SARS-CoV-2/FLU/RSV assay is intended as an aid in  the diagnosis of influenza from Nasopharyngeal swab specimens and  should not be used as a sole basis for treatment. Nasal washings and  aspirates are unacceptable for Xpert Xpress SARS-CoV-2/FLU/RSV  testing. Fact Sheet for Patients: https://www.moore.com/ Fact Sheet for Healthcare Providers: https://www.young.biz/ This test is not yet approved or cleared by the [place] FDA and  has been authorized for detection and/or diagnosis of SARS-CoV-2 by  FDA under an Emergency Use Authorization (EUA). This EUA will remain  in effect (meaning this test can be used) for the duration of the  Covid-19 declaration under Section 564(b)(1) of the Act, 21  U.S.C. section 360bbb-3(b)(1), unless the authorization is  terminated or revoked. Performed at [facility], [address] [place], [place] [address]          Radiology Studies: DG Chest Center 1 View  Result Date: 05/23/2019 CLINICAL DATA:  Shortness of breath and cough EXAM: PORTABLE CHEST 1 VIEW COMPARISON:  04/20/2019 FINDINGS: Cardiac  shadow is within normal limits. The lungs are well aerated bilaterally. No focal infiltrate or sizable effusion is seen. No bony abnormality is noted. IMPRESSION: No acute abnormality noted. Electronically Signed   By: Alcide Clever M.D.   On: 05/23/2019 23:31        Scheduled Meds: . apixaban  5 mg Oral BID  . benzonatate  100 mg Oral TID  . benztropine  1 mg Oral Daily  . diltiazem  120 mg Oral Daily  . guaiFENesin  600 mg Oral BID  . haloperidol  10 mg Oral BID  . methylPREDNISolone (SOLU-MEDROL) injection  40 mg Intravenous Q8H  . nicotine  21 mg Transdermal Daily  . pantoprazole  40 mg Oral Daily  . tiotropium  18 mcg Inhalation Daily  . traZODone  150 mg Oral QHS   Continuous Infusions: . sodium chloride 100 mL/hr at 05/24/19 0357  . azithromycin 500 mg (05/24/19 0504)  . cefTRIAXone (ROCEPHIN)  IV 1 g (05/24/19 0418)     LOS: 0 days    Time spent: 31 mins    Charise Killian, MD [facility] Pager [phone]  If 7PM-7AM, please contact night-coverage [email or web address] 05/24/2019, 7:17 AM

## 2019-05-24 NOTE — Progress Notes (Signed)
Inpatient Diabetes Program Recommendations  AACE/ADA: New Consensus Statement on Inpatient Glycemic Control   Target Ranges:  Prepandial:   less than 140 mg/dL      Peak postprandial:   less than 180 mg/dL (1-2 hours)      Critically ill patients:  140 - 180 mg/dL   Results for Adrian Gillespie, Adrian Gillespie (MRN [id number]) as of 05/24/2019 10:47  Ref. Range 05/23/2019 23:16 05/24/2019 05:44  Glucose Latest Ref Range: 70 - 99 mg/dL 888 (H) 916 (H)   Review of Glycemic Control  Diabetes history: No Outpatient Diabetes medications: NA Current orders for Inpatient glycemic control: None; Solumedrol 40 mg Q8H  Inpatient Diabetes Program Recommendations:   Correction (SSI): Please consider ordering CBGS AC&HS wtih Novolog 0-15 units TID with meals and Novolog 0-5 units QHS.  Insulin - Meal Coverage: If steroids are continued, please consider ordering Novolog 4 units TID with meals for meal coverage if patient eats at least 50% of meals.  Thanks, Orlando Penner, RN, MSN, CDE Diabetes Coordinator Inpatient Diabetes Program [phone] (Team Pager from 8am to 5pm)

## 2019-05-24 NOTE — Plan of Care (Signed)
  Problem: Education: Goal: Knowledge of [place] [facility] Education information/materials will improve Outcome: Progressing Goal: Emotional status will improve Outcome: Progressing Goal: Mental status will improve Outcome: Progressing Goal: Verbalization of understanding the information provided will improve Outcome: Progressing   Problem: Education: Goal: Emotional status will improve Outcome: Progressing   Problem: Education: Goal: Mental status will improve Outcome: Progressing   

## 2019-05-25 DIAGNOSIS — I493 Ventricular premature depolarization: Secondary | ICD-10-CM

## 2019-05-25 LAB — CBC
HCT: 39 % (ref 39.0–52.0)
Hemoglobin: 13.3 g/dL (ref 13.0–17.0)
MCH: 29.4 pg (ref 26.0–34.0)
MCHC: 34.1 g/dL (ref 30.0–36.0)
MCV: 86.3 fL (ref 80.0–100.0)
Platelets: 214 10*3/uL (ref 150–400)
RBC: 4.52 MIL/uL (ref 4.22–5.81)
RDW: 13.5 % (ref 11.5–15.5)
WBC: 16.7 10*3/uL — ABNORMAL HIGH (ref 4.0–10.5)
nRBC: 0 % (ref 0.0–0.2)

## 2019-05-25 LAB — GLUCOSE, CAPILLARY
Glucose-Capillary: 150 mg/dL — ABNORMAL HIGH (ref 70–99)
Glucose-Capillary: 191 mg/dL — ABNORMAL HIGH (ref 70–99)
Glucose-Capillary: 127 mg/dL — ABNORMAL HIGH (ref 70–99)
Glucose-Capillary: 130 mg/dL — ABNORMAL HIGH (ref 70–99)

## 2019-05-25 LAB — BASIC METABOLIC PANEL
Anion gap: 8 (ref 5–15)
BUN: 10 mg/dL (ref 8–23)
CO2: 22 mmol/L (ref 22–32)
Calcium: 8.5 mg/dL — ABNORMAL LOW (ref 8.9–10.3)
Chloride: 110 mmol/L (ref 98–111)
Creatinine, Ser: 0.8 mg/dL (ref 0.61–1.24)
GFR calc Af Amer: >60 mL/min (ref >60)
GFR calc non Af Amer: >60 mL/min (ref >60)
Glucose, Bld: 129 mg/dL — ABNORMAL HIGH (ref 70–99)
Potassium: 3.7 mmol/L (ref 3.5–5.1)
Sodium: 140 mmol/L (ref 135–145)

## 2019-05-25 LAB — MAGNESIUM: Magnesium: 1.9 mg/dL (ref 1.7–2.4)

## 2019-05-25 MED ORDER — AZITHROMYCIN 500 MG PO TABS
500.0000 mg | ORAL_TABLET | Freq: Every day | ORAL | Status: AC
  Administered 2019-05-26: 11:00:00 500 mg via ORAL
  Filled 2019-05-25: qty 1

## 2019-05-25 MED ORDER — ORAL CARE MOUTH RINSE
15.0000 mL | Freq: Two times a day (BID) | OROMUCOSAL | Status: DC
  Administered 2019-05-25 – 2019-05-26 (×3): 15 mL via OROMUCOSAL

## 2019-05-25 NOTE — TOC Initial Note (Signed)
Transition of Care [facility]) - Initial/Assessment Note    Patient Details  Name: Adrian Gillespie MRN: [id number] Date of Birth: [birth year]  Transition of Care [facility]) CM/SW Contact:    Allayne Butcher, RN Phone Number: 05/25/2019, 12:56 AM  Clinical Narrative:                  Patient admitted for COPD exacerbation.  Patient reports that he is independent in ADL's requiring no equipment.  Patient lives with significant other.  Patient has had oxygen at home in the past but not currently. Patient is current with PCP at [facility].   RNCM will cont to follow and assist with discharge planning.  Expected Discharge Plan: Home/Self Care Barriers to Discharge: Continued Medical Work up   Patient Goals and CMS Choice   CMS Medicare.gov Compare Post Acute Care list provided to:: Patient Choice offered to / list presented to : Patient  Expected Discharge Plan and Services Expected Discharge Plan: Home/Self Care   Discharge Planning Services: CM Consult   Living arrangements for the past 2 months: Apartment                                      Prior Living Arrangements/Services Living arrangements for the past 2 months: Apartment Lives with:: Significant Other Patient language and need for interpreter reviewed:: Yes Do you feel safe going back to the place where you live?: Yes      Need for Family Participation in Patient Care: Yes (Comment) Care giver support system in place?: Yes (comment)(significant other)   Criminal Activity/Legal Involvement Pertinent to Current Situation/Hospitalization: No - Comment as needed  Activities of Daily Living Home Assistive Devices/Equipment: Eyeglasses, Dentures (specify type), Oxygen ADL Screening (condition at time of admission) Patient's cognitive ability adequate to safely complete daily activities?: Yes Is the patient deaf or have difficulty hearing?: No Does the patient have difficulty seeing, even when wearing glasses/contacts?: No Does the  patient have difficulty concentrating, remembering, or making decisions?: No Patient able to express need for assistance with ADLs?: Yes Does the patient have difficulty dressing or bathing?: No Independently performs ADLs?: Yes (appropriate for developmental age) Does the patient have difficulty walking or climbing stairs?: No Weakness of Legs: None Weakness of Arms/Hands: None  Permission Sought/Granted Permission sought to share information with : Case Manager Permission granted to share information with : Yes, Verbal Permission Granted              Emotional Assessment Appearance:: Appears stated age Attitude/Demeanor/Rapport: Engaged Affect (typically observed): Accepting Orientation: : Oriented to Self, Oriented to Place, Oriented to  Time, Oriented to Situation Alcohol / Substance Use: Not Applicable Psych Involvement: No (comment)  Admission diagnosis:  COPD exacerbation (HCC) [J44.1] Sepsis (HCC) [A41.9] Acute respiratory failure with hypoxemia (HCC) [J96.01] Patient Active Problem List   Diagnosis Date Noted  . CAP (community acquired pneumonia) 04/20/2019  . PVC (premature ventricular contraction) 04/20/2019  . GERD (gastroesophageal reflux disease) 04/20/2019  . Acute on chronic respiratory failure with hypoxia (HCC)   . HTN (hypertension)   . COPD exacerbation (HCC)   . NSVT (nonsustained ventricular tachycardia) (HCC)   . COPD with acute bronchitis (HCC) 04/02/2019  . Acute respiratory failure with hypoxia (HCC) 04/02/2019  . Hypokalemia 04/02/2019  . Schizophrenia (HCC) 04/02/2019   PCP:  Andres Shad, MD Pharmacy:   [facility] - [place], [place] - [phone] [address] [phone]  [place] [address] Phone: [phone] Fax: [phone]     Social Determinants of Health (SDOH) Interventions    Readmission Risk Interventions No flowsheet data found.

## 2019-05-25 NOTE — Progress Notes (Signed)
PROGRESS NOTE    Adrian Gillespie  FUX:[id number] DOB: [birth year] DOA: 05/23/2019 PCP: Burnett Kanaris, MD   Chief Complaint  Patient presents with  . Shortness of Breath    Brief Narrative:  69 year old male with history of COPD (not on home O2), paroxysmal A. fib on Eliquis, schizophrenia and depression, hypertension, dyslipidemia who quit smoking about 1 month back presented with 4 days of worsening shortness of breath associated with productive cough and wheezing. Patient tachycardic, tachypneic, hypoxic requiring 3 L via nasal cannula in the ED.  Given IV Solu-Medrol and 3 DuoNeb with minimal improvement and admitted for COPD exacerbation.   Assessment & Plan:  Principal problem Acute respiratory failure with hypoxia (HCC) Secondary to COPD exacerbation.  Continue IV Solu-Medrol, as needed nebs and antitussives.  Empiric azithromycin (switched to p.o.).  Encouraged on him quitting smoking since past month. Still quite wheezy and gets dyspneic on minimal exertion.  Maintaining sats on 2 L.  Will assess for home O2 need prior to discharge.  Active problems Paroxysmal A. fib with RVR On presentation.  Now in sinus with frequent PVCs.  Continue home dose Cardizem and Eliquis.  PVCs Monitor K and mag.  Schizophrenia Stable.  Continue Haldol and Cogentin.  GERD Continue PPI  ?  Protein calorie malnutrition Obtain nutrition consult.  Prediabetes with hyperglycemia Elevated blood glucose in the setting of steroid use.  A1c of 5.8.  Monitor with sliding scale coverage.   DVT prophylaxis: Eliquis Code Status: Full code Family Communication: None Disposition: Home in the next 24-48 hours if respiratory symptoms improved.  Status is: Inpatient  Remains inpatient appropriate because:Inpatient level of care appropriate due to severity of illness   Dispo: The patient is from: Home              Anticipated d/c is to: Home              Anticipated d/c date is: 1 day        Patient currently is not medically stable to d/c.   Patient having ongoing respiratory failure with minimal improvement since admission, active wheezing requiring nebs and IV steroid.  He needs to be monitored inpatient setting for at least another 24-48 hours with IV Solu-Medrol, nebs and antibiotic.        Consultants:   None   Procedures: None   Antimicrobials: Azithromycin   Subjective: Breathing slightly better since admission but still feels wheezy and dyspneic on trying to get out of bed.  Objective: Vitals:   05/24/19 0343 05/24/19 0747 05/25/19 0044 05/25/19 0741  BP: 116/88 (!) 123/94 124/84 118/74  Pulse: 90 79 80 (!) 57  Resp: 18 17 (!) 22 15  Temp: 98.2 F (36.8 C) 97.7 F (36.5 C) 97.7 F (36.5 C) (!) 97.5 F (36.4 C)  TempSrc:  Oral Oral Oral  SpO2: 96% 96% 96% 90%  Weight:      Height:        Intake/Output Summary (Last 24 hours) at 05/25/2019 1146 Last data filed at 05/25/2019 0900 Gross per 24 hour  Intake 3180.32 ml  Output 600 ml  Net 2580.32 ml   Filed Weights   05/23/19 2314 05/24/19 0322  Weight: 74.4 kg 75.4 kg    Examination:  General: Elderly male not in distress HEENT: Temporal wasting, no pallor, moist mucosa, supple neck Chest: Diffuse wheezing (R>L) no crackles CVS: Normal S1-S2, no murmurs GI: Soft, nondistended, nontender Musculoskeletal: Warm, no edema    Data Reviewed: I have personally  reviewed following labs and imaging studies  CBC: Recent Labs  Lab 05/23/19 2316 05/24/19 0544 05/25/19 0523  WBC 10.6* 5.8 16.7*  NEUTROABS 6.1  --   --   HGB 16.3 13.9 13.3  HCT 48.1 41.2 39.0  MCV 86.2 86.2 86.3  PLT 235 210 214    Basic Metabolic Panel: Recent Labs  Lab 05/23/19 2316 05/24/19 0544 05/25/19 0523  NA 137 137 140  K 3.6 3.6 3.7  CL 105 106 110  CO2 23 22 22   GLUCOSE 146* 313* 129*  BUN 14 15 10   CREATININE 1.26* 1.17 0.80  CALCIUM 9.1 8.5* 8.5*    GFR: Estimated Creatinine Clearance:  89.6 mL/min (by C-G formula based on SCr of 0.8 mg/dL).  Liver Function Tests: Recent Labs  Lab 05/23/19 2316  AST 16  ALT 16  ALKPHOS 71  BILITOT 0.7  PROT 7.2  ALBUMIN 4.3    CBG: Recent Labs  Lab 05/24/19 1129 05/24/19 1723 05/24/19 2055 05/25/19 0739 05/25/19 1130  GLUCAP 245* 152* 152* 150* 191*     Recent Results (from the past 240 hour(s))  Culture, blood (Routine x 2)     Status: None (Preliminary result)   Collection Time: 05/23/19 11:16 PM   Specimen: BLOOD  Result Value Ref Range Status   Specimen Description BLOOD LEFT ANTECUBITAL  Final   Special Requests   Final    BOTTLES DRAWN AEROBIC AND ANAEROBIC Blood Culture adequate volume   Culture   Final    NO GROWTH 2 DAYS Performed at [facility], [address] [place], [address] [place]    Report Status PENDING  Incomplete  Culture, blood (Routine x 2)     Status: None (Preliminary result)   Collection Time: 05/23/19 11:16 PM   Specimen: BLOOD  Result Value Ref Range Status   Specimen Description BLOOD LEFT ANTECUBITAL  Final   Special Requests   Final    BOTTLES DRAWN AEROBIC AND ANAEROBIC Blood Culture adequate volume   Culture   Final    NO GROWTH 2 DAYS Performed at [facility], [address] [place], [address] [place]    Report Status PENDING  Incomplete  Respiratory Panel by RT PCR (Flu A&B, Covid) - Nasopharyngeal Swab     Status: None   Collection Time: 05/23/19 11:44 PM   Specimen: Nasopharyngeal Swab  Result Value Ref Range Status   SARS Coronavirus 2 by RT PCR NEGATIVE NEGATIVE Final    Comment: (NOTE) SARS-CoV-2 target nucleic acids are NOT DETECTED. The SARS-CoV-2 RNA is generally detectable in upper respiratoy specimens during the acute phase of infection. The lowest concentration of SARS-CoV-2 viral copies this assay can detect is 131 copies/mL. A negative result does not preclude SARS-Cov-2 infection and should not be used as the sole basis for treatment  or other patient management decisions. A negative result may occur with  improper specimen collection/handling, submission of specimen other than nasopharyngeal swab, presence of viral mutation(s) within the areas targeted by this assay, and inadequate number of viral copies (<131 copies/mL). A negative result must be combined with clinical observations, patient history, and epidemiological information. The expected result is Negative. Fact Sheet for Patients:  91638 Fact Sheet for Healthcare Providers:  05/25/19 This test is not yet ap proved or cleared by the https://www.moore.com/ FDA and  has been authorized for detection and/or diagnosis of SARS-CoV-2 by FDA under an Emergency Use Authorization (EUA). This EUA will remain  in effect (meaning this test  can be used) for the duration of the COVID-19 declaration under Section 564(b)(1) of the Act, 21 U.S.C. section 360bbb-3(b)(1), unless the authorization is terminated or revoked sooner.    Influenza A by PCR NEGATIVE NEGATIVE Final   Influenza B by PCR NEGATIVE NEGATIVE Final    Comment: (NOTE) The Xpert Xpress SARS-CoV-2/FLU/RSV assay is intended as an aid in  the diagnosis of influenza from Nasopharyngeal swab specimens and  should not be used as a sole basis for treatment. Nasal washings and  aspirates are unacceptable for Xpert Xpress SARS-CoV-2/FLU/RSV  testing. Fact Sheet for Patients: https://www.moore.com/ Fact Sheet for Healthcare Providers: https://www.young.biz/ This test is not yet approved or cleared by the [place] FDA and  has been authorized for detection and/or diagnosis of SARS-CoV-2 by  FDA under an Emergency Use Authorization (EUA). This EUA will remain  in effect (meaning this test can be used) for the duration of the  Covid-19 declaration under Section 564(b)(1) of the Act, 21  U.S.C. section  360bbb-3(b)(1), unless the authorization is  terminated or revoked. Performed at [facility], [address] [place], [place] [address]          Radiology Studies: DG Chest [place] 1 View  Result Date: 05/23/2019 CLINICAL DATA:  Shortness of breath and cough EXAM: PORTABLE CHEST 1 VIEW COMPARISON:  04/20/2019 FINDINGS: Cardiac shadow is within normal limits. The lungs are well aerated bilaterally. No focal infiltrate or sizable effusion is seen. No bony abnormality is noted. IMPRESSION: No acute abnormality noted. Electronically Signed   By: Alcide Clever M.D.   On: 05/23/2019 23:31        Scheduled Meds: . apixaban  5 mg Oral BID  . benzonatate  100 mg Oral TID  . benztropine  1 mg Oral Daily  . diltiazem  120 mg Oral Daily  . guaiFENesin  600 mg Oral BID  . haloperidol  10 mg Oral BID  . insulin aspart  0-15 Units Subcutaneous TID WC  . insulin aspart  0-5 Units Subcutaneous QHS  . mouth rinse  15 mL Mouth Rinse BID  . methylPREDNISolone (SOLU-MEDROL) injection  40 mg Intravenous Q8H  . nicotine  21 mg Transdermal Daily  . pantoprazole  40 mg Oral Daily  . tiotropium  18 mcg Inhalation Daily  . traZODone  150 mg Oral QHS   Continuous Infusions: . sodium chloride 100 mL/hr at 05/25/19 0400  . azithromycin Stopped (05/25/19 0225)     LOS: 1 day    Time spent: 35 minutes    Odessa Nishi, MD [facility]   To contact the attending provider between 7A-7P or the covering provider during after hours 7P-7A, please log into the web site [email or web address] and access using universal [place] password for that web site. If you do not have the password, please call the hospital operator.  05/25/2019, 11:46 AM

## 2019-05-26 DIAGNOSIS — J9601 Acute respiratory failure with hypoxia: Secondary | ICD-10-CM

## 2019-05-26 DIAGNOSIS — E440 Moderate protein-calorie malnutrition: Secondary | ICD-10-CM | POA: Diagnosis present

## 2019-05-26 DIAGNOSIS — E876 Hypokalemia: Secondary | ICD-10-CM

## 2019-05-26 LAB — GLUCOSE, CAPILLARY
Glucose-Capillary: 146 mg/dL — ABNORMAL HIGH (ref 70–99)
Glucose-Capillary: 135 mg/dL — ABNORMAL HIGH (ref 70–99)

## 2019-05-26 MED ORDER — NICOTINE 21 MG/24HR TD PT24: 21.0000 mg | MEDICATED_PATCH | Freq: Every day | TRANSDERMAL | 0 refills | Status: AC

## 2019-05-26 MED ORDER — PREDNISONE 20 MG PO TABS
20.0000 mg | ORAL_TABLET | Freq: Every day | ORAL | 0 refills | Status: DC
Start: 2019-05-26 — End: 2020-02-02

## 2019-05-26 MED ORDER — ENSURE ENLIVE PO LIQD: 237.0000 mL | Freq: Two times a day (BID) | ORAL | 12 refills | Status: AC

## 2019-05-26 MED ORDER — ENSURE ENLIVE PO LIQD
237.0000 mL | Freq: Two times a day (BID) | ORAL | Status: DC
  Administered 2019-05-26: 11:00:00 237 mL via ORAL

## 2019-05-26 NOTE — Progress Notes (Signed)
Initial Nutrition Assessment  DOCUMENTATION CODES:   Non-severe (moderate) malnutrition in context of chronic illness  INTERVENTION:  Recommend liberalizing diet to regular.  Provide Ensure Enlive po BID, each supplement provides 350 kcal and 20 grams of protein.  Encouraged adequate intake of calories and protein at meals. Discussed increased needs for calories/protein due to catabolic nature of COPD.  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  GOAL:   Patient will meet greater than or equal to 90% of their needs  MONITOR:   PO intake, Supplement acceptance, Labs, Weight trends, I & O's  REASON FOR ASSESSMENT:   Consult Assessment of nutrition requirement/status  ASSESSMENT:   69 year old male with PMHx of COPD, schizophrenia, HTN, HLD, anxiety, depression, GERD admitted with acute exacerbation of COPD, paroxysmal A-fib with RVR.   Met with patient at bedside. He reports his appetite is good and unchanged from baseline. He is eating 75-100% of his meals here. He reports at home he eats 2 meals per day. For breakfast he may have cereal with milk or oatmeal. For dinner he may have a meat with sides or a hotdog or hamburger. Patient is amenable to drinking an oral nutrition supplement to help meet calorie/protein needs. Discussed patient's nutritional status and catabolic nature of COPD.   Patient reports his UBW is around 172 lbs. According to chart patient was 77.6 kg (170.72 lbs) on 04/05/2019. He is now 75.4 kg (166.3 lbs). He has lost 2.2 kg (2.8% body weight) over the past almost 2 months, which is not significant for time frame. It appears in 2019 patient did weigh 83.9 kg (184.58 lbs) so he has had slow weight loss since then.  Medications reviewed and include: azithromycin, Novolog 0-15 units TID, Novolog 0-5 units QHS, Solu-Medrol 40 mg Q8hrs IV,  nicotine patch, Protonix, NS at 100 mL/hr.  Labs reviewed: CBG 127-191.  NUTRITION - FOCUSED PHYSICAL EXAM:    Most Recent Value  Orbital Region  Mild depletion  Upper Arm Region  Moderate depletion  Thoracic and Lumbar Region  Mild depletion  Buccal Region  Mild depletion  Temple Region  Moderate depletion  Clavicle Bone Region  Mild depletion  Clavicle and Acromion Bone Region  Mild depletion  Scapular Bone Region  No depletion  Dorsal Hand  Mild depletion  Patellar Region  Mild depletion  Anterior Thigh Region  Mild depletion  Posterior Calf Region  Moderate depletion  Edema (RD Assessment)  None  Hair  Reviewed  Eyes  Reviewed  Mouth  Reviewed  Skin  Reviewed [ecchymosis]  Nails  Reviewed      Diet Order:   Diet Order            Diet Heart Room service appropriate? Yes; Fluid consistency: Thin  Diet effective now             EDUCATION NEEDS:   Education needs have been addressed  Skin:  Skin Assessment: Reviewed RN Assessment(ecchymosis)  Last BM:  05/24/2019  Height:   Ht Readings from Last 1 Encounters:  05/24/19 '5\' 9"'  (1.753 m)   Weight:   Wt Readings from Last 1 Encounters:  05/24/19 75.4 kg   BMI:  Body mass index is 24.56 kg/m.  Estimated Nutritional Needs:   Kcal:  2000-2200  Protein:  100-110 grams  Fluid:  >/= 2 L/day  Jacklynn Barnacle, MS, RD, LDN Pager number available on Amion

## 2019-05-26 NOTE — Discharge Summary (Signed)
Physician Discharge Summary  Adrian Gillespie GXQ:[id number] DOB: 1951/01/01 DOA: 05/23/2019  PCP: Burnett Kanaris, MD  Admit date: 05/23/2019 Discharge date: 05/26/2019  Admitted From: Home Disposition: Home  Recommendations for Outpatient Follow-up:  1. Follow up with PCP in 1-2 weeks 2. Patient is being discharged on continuous home O2 (3 L via nasal cannula).  Patient being discharged home 12 days of oral prednisone taper and resume his home inhalers.  Home Health: None Equipment/Devices: Oxygen (3 L via nasal cannula continuously both at rest and ambulation)  Discharge Condition: Fair CODE STATUS: Full code Diet recommendation: Heart Healthy    Discharge Diagnoses:  Principal problem   Acute respiratory failure with hypoxemia ([place])  Active Problems: Atrial fibrillation with RVR (HCC) COPD with acute exacerbation (HCC)   Hypokalemia Essential hypertension   Malnutrition of moderate degree Prediabetes  Brief narrative/HPI 69 year old male with history of COPD (not on home O2), paroxysmal A. fib on Eliquis, schizophrenia and depression, hypertension, dyslipidemia who quit smoking about 1 month back presented with 4 days of worsening shortness of breath associated with productive cough and wheezing. Patient tachycardic, tachypneic, hypoxic requiring 3 L via nasal cannula in the ED.  Given IV Solu-Medrol and 3 DuoNeb with minimal improvement and admitted for COPD exacerbation.  Hospital course  Principal problem Acute respiratory failure with hypoxia (HCC) Secondary to COPD exacerbation.    Received IV Solu-Medrol, as needed nebs and antitussives.    Completed empiric 3 days of azithromycin.   Patient reports quitting smoking for past 1 month and was encouraged.  Patient noted to drop in O2 sat to mid to high 80s on room air both at rest and on ambulation and needs 3 L via nasal cannula continuously.  Home O2 arranged. Symptoms much improved and will be discharged home on  oral prednisone taper over the next 12 days, resume home inhalers, antitussives and nicotine patch.  Follow-up with PCP.  Active problems Paroxysmal A. fib with RVR Present on admission, resolved to sinus rhythm and stable on telemetry.  Continued home dose Cardizem and Eliquis.  PVCs K and mag normal.  Schizophrenia Stable.  Continue Haldol and Cogentin.  GERD Continue PPI   Protein calorie malnutrition, moderate Added nutrition supplement.  Prediabetes with hyperglycemia Elevated blood glucose in the setting of steroid use.  A1c of 5.8.    Follow as outpatient.  Procedure: None Disposition: Home Family communication: None   Discharge Instructions   Allergies as of 05/26/2019   No Known Allergies     Medication List    TAKE these medications   albuterol 108 (90 Base) MCG/ACT inhaler Commonly known as: VENTOLIN HFA Inhale 2 puffs into the lungs every 6 (six) hours as needed for shortness of breath or wheezing.   apixaban 5 MG Tabs tablet Commonly known as: ELIQUIS Take 1 tablet (5 mg total) by mouth 2 (two) times daily.   benzonatate 100 MG capsule Commonly known as: TESSALON Take 1 capsule (100 mg total) by mouth 3 (three) times daily.   benztropine 1 MG tablet Commonly known as: COGENTIN Take 1 mg by mouth daily.   Bevespi Aerosphere 9-4.8 MCG/ACT Aero Generic drug: Glycopyrrolate-Formoterol Inhale 2 puffs into the lungs at bedtime.   feeding supplement (ENSURE ENLIVE) Liqd Take 237 mLs by mouth 2 (two) times daily between meals.   guaiFENesin-dextromethorphan 100-10 MG/5ML syrup Commonly known as: ROBITUSSIN DM Take 5 mLs by mouth every 4 (four) hours as needed for cough.   haloperidol 10 MG tablet Commonly known as:  HALDOL Take 10 mg by mouth 2 (two) times daily.   hydrOXYzine 50 MG capsule Commonly known as: VISTARIL Take 50 mg by mouth 2 (two) times daily as needed for anxiety.   [name] Sustenna 234 MG/1.5ML Susy injection Generic  drug: paliperidone Inject 234 mg into the muscle every 30 (thirty) days.   nicotine 21 mg/24hr patch Commonly known as: NICODERM CQ - dosed in mg/24 hours Place 1 patch (21 mg total) onto the skin daily.   omeprazole 20 MG capsule Commonly known as: PRILOSEC Take 20 mg by mouth daily.   predniSONE 20 MG tablet Commonly known as: DELTASONE Take 1 tablet (20 mg total) by mouth daily with breakfast. What changed:   medication strength  how much to take  how to take this  when to take this  additional instructions   Spiriva HandiHaler 18 MCG inhalation capsule Generic drug: tiotropium Place 1 capsule (18 mcg total) into inhaler and inhale daily.   traZODone 100 MG tablet Commonly known as: DESYREL Take 300 mg by mouth at bedtime.            Durable Medical Equipment  (From admission, onward)         Start     Ordered   05/26/19 1144  For home use only DME oxygen  Once    Comments: 02 sat on RA at rest <88% 02 sat on RA on 3 L [place] >92 % O2 SAT ON RA ON AMBULATION <87% Patient required 3L 02 via [place] continuously both at rest and on ambulation  Question Answer Comment  Length of Need 12 Months   Mode or (Route) Nasal cannula   Liters per Minute 3   Frequency Continuous (stationary and portable oxygen unit needed)   Oxygen delivery system Gas      05/26/19 1145         Follow-up Information    Andres Shad, MD. Schedule an appointment as soon as possible for a visit in 1 week(s).   Specialty: Internal Medicine Contact information: [address] [place] [address] [phone]          No Known Allergies     Procedures/Studies: DG Chest Port 1 View  Result Date: 05/23/2019 CLINICAL DATA:  Shortness of breath and cough EXAM: PORTABLE CHEST 1 VIEW COMPARISON:  04/20/2019 FINDINGS: Cardiac shadow is within normal limits. The lungs are well aerated bilaterally. No focal infiltrate or sizable effusion is seen. No bony abnormality is  noted. IMPRESSION: No acute abnormality noted. Electronically Signed   By: Alcide Clever M.D.   On: 05/23/2019 23:31       Subjective: Reports breathing to be much better.  No further wheezing  Discharge Exam: Vitals:   05/26/19 0806 05/26/19 1039  BP: 113/81   Pulse: (!) 50 66  Resp: 20   Temp: 98.4 F (36.9 C)   SpO2: 92%    Vitals:   05/26/19 0400 05/26/19 0600 05/26/19 0806 05/26/19 1039  BP:   113/81   Pulse: (!) 52 60 (!) 50 66  Resp: 20 19 20    Temp:   98.4 F (36.9 C)   TempSrc:   Oral   SpO2: 91% 93% 92%   Weight:      Height:        General: Elderly male not in distress HEENT: Temporal wasting +, moist mucosa, supple neck Chest: Improved breath sounds bilaterally, few scattered rhonchi CVs: Normal S1-S2, no murmurs GI: Soft, nondistended, nontender Musculoskeletal: Warm, no edema  The results of significant diagnostics from this hospitalization (including imaging, microbiology, ancillary and laboratory) are listed below for reference.     Microbiology: Recent Results (from the past 240 hour(s))  Culture, blood (Routine x 2)     Status: None (Preliminary result)   Collection Time: 05/23/19 11:16 PM   Specimen: BLOOD  Result Value Ref Range Status   Specimen Description BLOOD LEFT ANTECUBITAL  Final   Special Requests   Final    BOTTLES DRAWN AEROBIC AND ANAEROBIC Blood Culture adequate volume   Culture   Final    NO GROWTH 3 DAYS Performed at [facility], [address] [place], [place] [address]    Report Status PENDING  Incomplete  Culture, blood (Routine x 2)     Status: None (Preliminary result)   Collection Time: 05/23/19 11:16 PM   Specimen: BLOOD  Result Value Ref Range Status   Specimen Description BLOOD LEFT ANTECUBITAL  Final   Special Requests   Final    BOTTLES DRAWN AEROBIC AND ANAEROBIC Blood Culture adequate volume   Culture   Final    NO GROWTH 3 DAYS Performed at [facility], [address]  [place], [place] [address]    Report Status PENDING  Incomplete  Respiratory Panel by RT PCR (Flu A&B, Covid) - Nasopharyngeal Swab     Status: None   Collection Time: 05/23/19 11:44 PM   Specimen: Nasopharyngeal Swab  Result Value Ref Range Status   SARS Coronavirus 2 by RT PCR NEGATIVE NEGATIVE Final    Comment: (NOTE) SARS-CoV-2 target nucleic acids are NOT DETECTED. The SARS-CoV-2 RNA is generally detectable in upper respiratoy specimens during the acute phase of infection. The lowest concentration of SARS-CoV-2 viral copies this assay can detect is 131 copies/mL. A negative result does not preclude SARS-Cov-2 infection and should not be used as the sole basis for treatment or other patient management decisions. A negative result may occur with  improper specimen collection/handling, submission of specimen other than nasopharyngeal swab, presence of viral mutation(s) within the areas targeted by this assay, and inadequate number of viral copies (<131 copies/mL). A negative result must be combined with clinical observations, patient history, and epidemiological information. The expected result is Negative. Fact Sheet for Patients:  https://www.moore.com/https://www.fda.gov/media/142436/download Fact Sheet for Healthcare Providers:  https://www.young.biz/https://www.fda.gov/media/142435/download This test is not yet ap proved or cleared by the [place] FDA and  has been authorized for detection and/or diagnosis of SARS-CoV-2 by FDA under an Emergency Use Authorization (EUA). This EUA will remain  in effect (meaning this test can be used) for the duration of the COVID-19 declaration under Section 564(b)(1) of the Act, 21 U.S.C. section 360bbb-3(b)(1), unless the authorization is terminated or revoked sooner.    Influenza A by PCR NEGATIVE NEGATIVE Final   Influenza B by PCR NEGATIVE NEGATIVE Final    Comment: (NOTE) The Xpert Xpress SARS-CoV-2/FLU/RSV assay is intended as an aid in  the diagnosis of influenza from  Nasopharyngeal swab specimens and  should not be used as a sole basis for treatment. Nasal washings and  aspirates are unacceptable for Xpert Xpress SARS-CoV-2/FLU/RSV  testing. Fact Sheet for Patients: https://www.moore.com/https://www.fda.gov/media/142436/download Fact Sheet for Healthcare Providers: https://www.young.biz/https://www.fda.gov/media/142435/download This test is not yet approved or cleared by the [place] FDA and  has been authorized for detection and/or diagnosis of SARS-CoV-2 by  FDA under an Emergency Use Authorization (EUA). This EUA will remain  in effect (meaning this test can be used) for the duration of the  Covid-19 declaration under Section 564(b)(1) of the Act, 21  U.S.C. section 360bbb-3(b)(1), unless the authorization is  terminated or revoked. Performed at [facility], [address] [place], [place] [address]      Labs: BNP (last 3 results) Recent Labs    04/02/19 1157  BNP 132.0*   Basic Metabolic Panel: Recent Labs  Lab 05/23/19 2316 05/24/19 0544 05/25/19 0523  NA 137 137 140  K 3.6 3.6 3.7  CL 105 106 110  CO2 23 22 22   GLUCOSE 146* 313* 129*  BUN 14 15 10   CREATININE 1.26* 1.17 0.80  CALCIUM 9.1 8.5* 8.5*  MG  --   --  1.9   Liver Function Tests: Recent Labs  Lab 05/23/19 2316  AST 16  ALT 16  ALKPHOS 71  BILITOT 0.7  PROT 7.2  ALBUMIN 4.3   No results for input(s): LIPASE, AMYLASE in the last 168 hours. No results for input(s): AMMONIA in the last 168 hours. CBC: Recent Labs  Lab 05/23/19 2316 05/24/19 0544 05/25/19 0523  WBC 10.6* 5.8 16.7*  NEUTROABS 6.1  --   --   HGB 16.3 13.9 13.3  HCT 48.1 41.2 39.0  MCV 86.2 86.2 86.3  PLT 235 210 214   Cardiac Enzymes: No results for input(s): CKTOTAL, CKMB, CKMBINDEX, TROPONINI in the last 168 hours. BNP: Invalid input(s): POCBNP CBG: Recent Labs  Lab 05/25/19 0739 05/25/19 1130 05/25/19 1654 05/25/19 2103 05/26/19 0747  GLUCAP 150* 191* 127* 130* 146*   D-Dimer No results for  input(s): DDIMER in the last 72 hours. Hgb A1c Recent Labs    05/24/19 0544  HGBA1C 5.8*   Lipid Profile No results for input(s): CHOL, HDL, LDLCALC, TRIG, CHOLHDL, LDLDIRECT in the last 72 hours. Thyroid function studies No results for input(s): TSH, T4TOTAL, T3FREE, THYROIDAB in the last 72 hours.  Invalid input(s): FREET3 Anemia work up No results for input(s): VITAMINB12, FOLATE, FERRITIN, TIBC, IRON, RETICCTPCT in the last 72 hours. Urinalysis    Component Value Date/Time   COLORURINE YELLOW (A) 05/23/2019 2349   APPEARANCEUR CLEAR (A) 05/23/2019 2349   LABSPEC 1.010 05/23/2019 2349   PHURINE 6.0 05/23/2019 2349   GLUCOSEU NEGATIVE 05/23/2019 2349   HGBUR NEGATIVE 05/23/2019 2349   BILIRUBINUR NEGATIVE 05/23/2019 2349   KETONESUR NEGATIVE 05/23/2019 2349   PROTEINUR NEGATIVE 05/23/2019 2349   NITRITE NEGATIVE 05/23/2019 2349   LEUKOCYTESUR NEGATIVE 05/23/2019 2349   Sepsis Labs Invalid input(s): PROCALCITONIN,  WBC,  LACTICIDVEN Microbiology Recent Results (from the past 240 hour(s))  Culture, blood (Routine x 2)     Status: None (Preliminary result)   Collection Time: 05/23/19 11:16 PM   Specimen: BLOOD  Result Value Ref Range Status   Specimen Description BLOOD LEFT ANTECUBITAL  Final   Special Requests   Final    BOTTLES DRAWN AEROBIC AND ANAEROBIC Blood Culture adequate volume   Culture   Final    NO GROWTH 3 DAYS Performed at [facility], [address], [place], [address] [place]    Report Status PENDING  Incomplete  Culture, blood (Routine x 2)     Status: None (Preliminary result)   Collection Time: 05/23/19 11:16 PM   Specimen: BLOOD  Result Value Ref Range Status   Specimen Description BLOOD LEFT ANTECUBITAL  Final   Special Requests   Final    BOTTLES DRAWN AEROBIC AND ANAEROBIC Blood Culture adequate volume   Culture   Final    NO GROWTH 3 DAYS Performed at  [facility], [address] [place], [place] [address]    Report  Status PENDING  Incomplete  Respiratory Panel by RT PCR (Flu A&B, Covid) - Nasopharyngeal Swab     Status: None   Collection Time: 05/23/19 11:44 PM   Specimen: Nasopharyngeal Swab  Result Value Ref Range Status   SARS Coronavirus 2 by RT PCR NEGATIVE NEGATIVE Final    Comment: (NOTE) SARS-CoV-2 target nucleic acids are NOT DETECTED. The SARS-CoV-2 RNA is generally detectable in upper respiratoy specimens during the acute phase of infection. The lowest concentration of SARS-CoV-2 viral copies this assay can detect is 131 copies/mL. A negative result does not preclude SARS-Cov-2 infection and should not be used as the sole basis for treatment or other patient management decisions. A negative result may occur with  improper specimen collection/handling, submission of specimen other than nasopharyngeal swab, presence of viral mutation(s) within the areas targeted by this assay, and inadequate number of viral copies (<131 copies/mL). A negative result must be combined with clinical observations, patient history, and epidemiological information. The expected result is Negative. Fact Sheet for Patients:  https://www.moore.com/ Fact Sheet for Healthcare Providers:  https://www.young.biz/ This test is not yet ap proved or cleared by the [place] FDA and  has been authorized for detection and/or diagnosis of SARS-CoV-2 by FDA under an Emergency Use Authorization (EUA). This EUA will remain  in effect (meaning this test can be used) for the duration of the COVID-19 declaration under Section 564(b)(1) of the Act, 21 U.S.C. section 360bbb-3(b)(1), unless the authorization is terminated or revoked sooner.    Influenza A by PCR NEGATIVE NEGATIVE Final   Influenza B by PCR NEGATIVE NEGATIVE Final    Comment: (NOTE) The Xpert Xpress SARS-CoV-2/FLU/RSV assay is intended as an aid in  the diagnosis of influenza from Nasopharyngeal swab specimens and   should not be used as a sole basis for treatment. Nasal washings and  aspirates are unacceptable for Xpert Xpress SARS-CoV-2/FLU/RSV  testing. Fact Sheet for Patients: https://www.moore.com/ Fact Sheet for Healthcare Providers: https://www.young.biz/ This test is not yet approved or cleared by the [place] FDA and  has been authorized for detection and/or diagnosis of SARS-CoV-2 by  FDA under an Emergency Use Authorization (EUA). This EUA will remain  in effect (meaning this test can be used) for the duration of the  Covid-19 declaration under Section 564(b)(1) of the Act, 21  U.S.C. section 360bbb-3(b)(1), unless the authorization is  terminated or revoked. Performed at [facility], [address] [place], [place] [address]      Time coordinating discharge: 35 minutes  SIGNED:   Eddie North, MD  [facility] 05/26/2019, 11:45 AM Pager   If 7PM-7AM, please contact night-coverage [email or web address] Password [email or web address]

## 2019-05-26 NOTE — Progress Notes (Signed)
Patient's O2 saturation at rest on room air is 86. Patient's O2 saturation at rest on 3L is 92. Patient's O2 saturation while ambulating is 86 on room air. Patient's O2 saturation while ambulating is 89 on 3L. Patient's O2 saturation while ambulating is 92% on 4L.  Dr. Gonzella Lex aware.

## 2019-05-26 NOTE — Progress Notes (Signed)
Reviewed AVS with patient who is waiting for oxygen supply before leaving with his brother.

## 2019-05-26 NOTE — Discharge Instructions (Signed)

## 2019-05-26 NOTE — TOC Transition Note (Signed)
Transition of Care [facility]) - CM/SW Discharge Note   Patient Details  Name: Adrian Gillespie MRN: [id number] Date of Birth: May 07, 1950  [facility] CM/SW Contact:  Allayne Butcher, RN Phone Number: 05/26/2019, 12:16 PM   Clinical Narrative:    Patient has been medically cleared for discharge home. Patient does need home oxygen.  The oxygen referral has been given to [facility] with [employer].  Oxygen will be delivered to the patient's room before he is discharged.  Patient's brother will be picking him up this afternoon.   Final next level of care: Home/Self Care Barriers to Discharge: Barriers Resolved   Patient Goals and CMS Choice   CMS Medicare.gov Compare Post Acute Care list provided to:: Patient Choice offered to / list presented to : Patient  Discharge Placement                       Discharge Plan and Services   Discharge Planning Services: CM Consult            DME Arranged: Oxygen DME Agency: [employer] Date DME Agency Contacted: 05/26/19 Time DME Agency Contacted: 1215 Representative spoke with at DME Agency: Mitchell Heir            Social Determinants of Health (SDOH) Interventions     Readmission Risk Interventions Readmission Risk Prevention Plan 05/25/2019  Transportation Screening Complete  PCP or Specialist Appt within 3-5 Days Complete  HRI or Home Care Consult Complete  Social Work Consult for Recovery Care Planning/Counseling Complete  Palliative Care Screening Not Applicable  Medication Review Ocean[employer]) Complete

## 2019-05-26 NOTE — Progress Notes (Signed)
Patient's O2 saturation at rest on 3L is 92. Patient's O2 saturation while ambulating is 86 on room air. Patient's O2 saturation while ambulating is 89 on 3L. Patient's O2 saturation while ambulating is 92% on 4L.  Dr. Gonzella Lex aware.

## 2019-05-26 NOTE — Progress Notes (Signed)
Patient discharged home with brother. Patient adequate for discharge. Patient wheeled out with O2 tank and concentrator.

## 2019-05-28 LAB — CULTURE, BLOOD (ROUTINE X 2)
Culture: NO GROWTH
Culture: NO GROWTH
Special Requests: ADEQUATE
Special Requests: ADEQUATE

## 2020-02-01 ENCOUNTER — Other Ambulatory Visit: Payer: Self-pay

## 2020-02-01 ENCOUNTER — Emergency Department: Payer: Medicare Other

## 2020-02-01 ENCOUNTER — Encounter: Payer: Self-pay | Admitting: *Deleted

## 2020-02-01 DIAGNOSIS — I480 Paroxysmal atrial fibrillation: Secondary | ICD-10-CM | POA: Diagnosis present

## 2020-02-01 DIAGNOSIS — Z7901 Long term (current) use of anticoagulants: Secondary | ICD-10-CM

## 2020-02-01 DIAGNOSIS — Z20822 Contact with and (suspected) exposure to covid-19: Secondary | ICD-10-CM | POA: Diagnosis present

## 2020-02-01 DIAGNOSIS — S7292XA Unspecified fracture of left femur, initial encounter for closed fracture: Secondary | ICD-10-CM | POA: Diagnosis not present

## 2020-02-01 DIAGNOSIS — I10 Essential (primary) hypertension: Secondary | ICD-10-CM | POA: Diagnosis present

## 2020-02-01 DIAGNOSIS — B181 Chronic viral hepatitis B without delta-agent: Secondary | ICD-10-CM | POA: Diagnosis present

## 2020-02-01 DIAGNOSIS — S72115A Nondisplaced fracture of greater trochanter of left femur, initial encounter for closed fracture: Principal | ICD-10-CM | POA: Diagnosis present

## 2020-02-01 DIAGNOSIS — D6859 Other primary thrombophilia: Secondary | ICD-10-CM | POA: Diagnosis present

## 2020-02-01 DIAGNOSIS — J8283 Eosinophilic asthma: Secondary | ICD-10-CM | POA: Diagnosis present

## 2020-02-01 DIAGNOSIS — Y92512 Supermarket, store or market as the place of occurrence of the external cause: Secondary | ICD-10-CM

## 2020-02-01 DIAGNOSIS — F17210 Nicotine dependence, cigarettes, uncomplicated: Secondary | ICD-10-CM | POA: Diagnosis present

## 2020-02-01 DIAGNOSIS — F0390 Unspecified dementia without behavioral disturbance: Secondary | ICD-10-CM | POA: Diagnosis present

## 2020-02-01 DIAGNOSIS — Z79899 Other long term (current) drug therapy: Secondary | ICD-10-CM

## 2020-02-01 DIAGNOSIS — Z7951 Long term (current) use of inhaled steroids: Secondary | ICD-10-CM

## 2020-02-01 DIAGNOSIS — Z96642 Presence of left artificial hip joint: Secondary | ICD-10-CM | POA: Diagnosis present

## 2020-02-01 DIAGNOSIS — J449 Chronic obstructive pulmonary disease, unspecified: Secondary | ICD-10-CM | POA: Diagnosis present

## 2020-02-01 DIAGNOSIS — F209 Schizophrenia, unspecified: Secondary | ICD-10-CM | POA: Diagnosis present

## 2020-02-01 DIAGNOSIS — K219 Gastro-esophageal reflux disease without esophagitis: Secondary | ICD-10-CM | POA: Diagnosis present

## 2020-02-01 DIAGNOSIS — K5900 Constipation, unspecified: Secondary | ICD-10-CM | POA: Diagnosis present

## 2020-02-01 DIAGNOSIS — W010XXA Fall on same level from slipping, tripping and stumbling without subsequent striking against object, initial encounter: Secondary | ICD-10-CM | POA: Diagnosis present

## 2020-02-01 DIAGNOSIS — E785 Hyperlipidemia, unspecified: Secondary | ICD-10-CM | POA: Diagnosis present

## 2020-02-01 MED ORDER — FENTANYL CITRATE (PF) 100 MCG/2ML IJ SOLN
50.0000 ug | INTRAMUSCULAR | Status: AC | PRN
  Administered 2020-02-01 – 2020-02-02 (×2): 50 ug via INTRAVENOUS
  Filled 2020-02-01 (×2): qty 2

## 2020-02-01 NOTE — ED Triage Notes (Signed)
EMS brings pt in from home; fell at [employer] on wet floor today; c/o left hip pain since

## 2020-02-01 NOTE — ED Triage Notes (Signed)
Pt to ED reporting left hip pain after a fall in [employer]. 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 Pt reports he was able to "crawl" out of the store and then drive home before calling 911 but denies feeling able to sit or bear weight upon arrival to ED. Pt is yelling out in pain in triage but sent directly to Xray. NAD noted, no obvious deformity, shortening or rotation.

## 2020-02-02 ENCOUNTER — Inpatient Hospital Stay
Admission: EM | Admit: 2020-02-02 | Discharge: 2020-02-07 | DRG: 536 | Disposition: A | Discharge status: Skilled Nursing Facility | Payer: Medicare Other | Attending: Internal Medicine | Admitting: Internal Medicine

## 2020-02-02 ENCOUNTER — Emergency Department: Payer: Medicare Other

## 2020-02-02 DIAGNOSIS — K5900 Constipation, unspecified: Secondary | ICD-10-CM | POA: Diagnosis present

## 2020-02-02 DIAGNOSIS — Z79899 Other long term (current) drug therapy: Secondary | ICD-10-CM | POA: Diagnosis not present

## 2020-02-02 DIAGNOSIS — J449 Chronic obstructive pulmonary disease, unspecified: Secondary | ICD-10-CM

## 2020-02-02 DIAGNOSIS — R5383 Other fatigue: Secondary | ICD-10-CM | POA: Diagnosis not present

## 2020-02-02 DIAGNOSIS — Z20822 Contact with and (suspected) exposure to covid-19: Secondary | ICD-10-CM | POA: Diagnosis present

## 2020-02-02 DIAGNOSIS — E785 Hyperlipidemia, unspecified: Secondary | ICD-10-CM | POA: Diagnosis present

## 2020-02-02 DIAGNOSIS — F17210 Nicotine dependence, cigarettes, uncomplicated: Secondary | ICD-10-CM | POA: Diagnosis present

## 2020-02-02 DIAGNOSIS — Y92512 Supermarket, store or market as the place of occurrence of the external cause: Secondary | ICD-10-CM | POA: Diagnosis not present

## 2020-02-02 DIAGNOSIS — D6859 Other primary thrombophilia: Secondary | ICD-10-CM | POA: Diagnosis present

## 2020-02-02 DIAGNOSIS — J8283 Eosinophilic asthma: Secondary | ICD-10-CM

## 2020-02-02 DIAGNOSIS — Z7901 Long term (current) use of anticoagulants: Secondary | ICD-10-CM | POA: Diagnosis not present

## 2020-02-02 DIAGNOSIS — F0390 Unspecified dementia without behavioral disturbance: Secondary | ICD-10-CM | POA: Diagnosis present

## 2020-02-02 DIAGNOSIS — F209 Schizophrenia, unspecified: Secondary | ICD-10-CM | POA: Diagnosis present

## 2020-02-02 DIAGNOSIS — I480 Paroxysmal atrial fibrillation: Secondary | ICD-10-CM | POA: Diagnosis present

## 2020-02-02 DIAGNOSIS — B181 Chronic viral hepatitis B without delta-agent: Secondary | ICD-10-CM | POA: Diagnosis present

## 2020-02-02 DIAGNOSIS — I10 Essential (primary) hypertension: Secondary | ICD-10-CM | POA: Diagnosis present

## 2020-02-02 DIAGNOSIS — Z7951 Long term (current) use of inhaled steroids: Secondary | ICD-10-CM | POA: Diagnosis not present

## 2020-02-02 DIAGNOSIS — Z96642 Presence of left artificial hip joint: Secondary | ICD-10-CM | POA: Diagnosis present

## 2020-02-02 DIAGNOSIS — K219 Gastro-esophageal reflux disease without esophagitis: Secondary | ICD-10-CM | POA: Diagnosis present

## 2020-02-02 DIAGNOSIS — S72115A Nondisplaced fracture of greater trochanter of left femur, initial encounter for closed fracture: Secondary | ICD-10-CM

## 2020-02-02 DIAGNOSIS — W010XXA Fall on same level from slipping, tripping and stumbling without subsequent striking against object, initial encounter: Secondary | ICD-10-CM | POA: Diagnosis present

## 2020-02-02 DIAGNOSIS — D6869 Other thrombophilia: Secondary | ICD-10-CM | POA: Diagnosis not present

## 2020-02-02 DIAGNOSIS — R52 Pain, unspecified: Secondary | ICD-10-CM | POA: Diagnosis present

## 2020-02-02 DIAGNOSIS — S7292XS Unspecified fracture of left femur, sequela: Secondary | ICD-10-CM | POA: Diagnosis not present

## 2020-02-02 DIAGNOSIS — S7292XA Unspecified fracture of left femur, initial encounter for closed fracture: Secondary | ICD-10-CM | POA: Diagnosis present

## 2020-02-02 LAB — CBC WITH DIFFERENTIAL/PLATELET
Abs Immature Granulocytes: 0.04 10*3/uL (ref 0.00–0.07)
Basophils Absolute: 0 10*3/uL (ref 0.0–0.1)
Basophils Relative: 0 %
Eosinophils Absolute: 0.1 10*3/uL (ref 0.0–0.5)
Eosinophils Relative: 1 %
HCT: 42.4 % (ref 39.0–52.0)
Hemoglobin: 14.3 g/dL (ref 13.0–17.0)
Immature Granulocytes: 0 %
Lymphocytes Relative: 18 %
Lymphs Abs: 2.4 10*3/uL (ref 0.7–4.0)
MCH: 27.9 pg (ref 26.0–34.0)
MCHC: 33.7 g/dL (ref 30.0–36.0)
MCV: 82.7 fL (ref 80.0–100.0)
Monocytes Absolute: 1 10*3/uL (ref 0.1–1.0)
Monocytes Relative: 8 %
Neutro Abs: 9.3 10*3/uL — ABNORMAL HIGH (ref 1.7–7.7)
Neutrophils Relative %: 73 %
Platelets: 215 10*3/uL (ref 150–400)
RBC: 5.13 MIL/uL (ref 4.22–5.81)
RDW: 12.8 % (ref 11.5–15.5)
WBC: 12.9 10*3/uL — ABNORMAL HIGH (ref 4.0–10.5)
nRBC: 0 % (ref 0.0–0.2)

## 2020-02-02 LAB — COMPREHENSIVE METABOLIC PANEL
ALT: 13 U/L (ref 0–44)
AST: 20 U/L (ref 15–41)
Albumin: 3.6 g/dL (ref 3.5–5.0)
Alkaline Phosphatase: 62 U/L (ref 38–126)
Anion gap: 11 (ref 5–15)
BUN: 7 mg/dL — ABNORMAL LOW (ref 8–23)
CO2: 20 mmol/L — ABNORMAL LOW (ref 22–32)
Calcium: 8.7 mg/dL — ABNORMAL LOW (ref 8.9–10.3)
Chloride: 104 mmol/L (ref 98–111)
Creatinine, Ser: 1.03 mg/dL (ref 0.61–1.24)
GFR, Estimated: >60 mL/min (ref >60)
Glucose, Bld: 138 mg/dL — ABNORMAL HIGH (ref 70–99)
Potassium: 3.9 mmol/L (ref 3.5–5.1)
Sodium: 135 mmol/L (ref 135–145)
Total Bilirubin: 1 mg/dL (ref 0.3–1.2)
Total Protein: 6.2 g/dL — ABNORMAL LOW (ref 6.5–8.1)

## 2020-02-02 LAB — PROTIME-INR
INR: 1.2 (ref 0.8–1.2)
Prothrombin Time: 14.7 seconds (ref 11.4–15.2)

## 2020-02-02 LAB — TYPE AND SCREEN
ABO/RH(D): B POS
Antibody Screen: NEGATIVE

## 2020-02-02 LAB — RESP PANEL BY RT-PCR (FLU A&B, COVID) ARPGX2
Influenza A by PCR: NEGATIVE
Influenza B by PCR: NEGATIVE
SARS Coronavirus 2 by RT PCR: NEGATIVE

## 2020-02-02 LAB — APTT: aPTT: 38 seconds — ABNORMAL HIGH (ref 24–36)

## 2020-02-02 MED ORDER — MORPHINE SULFATE (PF) 4 MG/ML IV SOLN
INTRAVENOUS | Status: AC
  Filled 2020-02-02: qty 1

## 2020-02-02 MED ORDER — TRAZODONE HCL 100 MG PO TABS
300.0000 mg | ORAL_TABLET | Freq: Every day | ORAL | Status: DC
  Administered 2020-02-02 – 2020-02-05 (×4): 300 mg via ORAL
  Filled 2020-02-02 (×5): qty 3

## 2020-02-02 MED ORDER — HYDROXYZINE PAMOATE 50 MG PO CAPS
50.0000 mg | ORAL_CAPSULE | Freq: Two times a day (BID) | ORAL | Status: DC | PRN
  Filled 2020-02-02: qty 1

## 2020-02-02 MED ORDER — MORPHINE SULFATE (PF) 4 MG/ML IV SOLN
4.0000 mg | INTRAVENOUS | Status: AC | PRN
Start: 2020-02-02 — End: 2020-02-02
  Administered 2020-02-02 (×3): 4 mg via INTRAVENOUS
  Filled 2020-02-02 (×3): qty 1

## 2020-02-02 MED ORDER — MORPHINE SULFATE (PF) 4 MG/ML IV SOLN: 4.0000 mg | Freq: Once | INTRAVENOUS | Status: DC

## 2020-02-02 MED ORDER — SODIUM CHLORIDE 0.9 % IV SOLN: 250.0000 mL | INTRAVENOUS | Status: DC | PRN

## 2020-02-02 MED ORDER — SODIUM CHLORIDE 0.9% FLUSH
3.0000 mL | INTRAVENOUS | Status: DC | PRN
Start: 2020-02-02 — End: 2020-02-07

## 2020-02-02 MED ORDER — TIOTROPIUM BROMIDE MONOHYDRATE 18 MCG IN CAPS
18.0000 ug | ORAL_CAPSULE | Freq: Every day | RESPIRATORY_TRACT | Status: DC
  Administered 2020-02-03 – 2020-02-07 (×5): 18 ug via RESPIRATORY_TRACT
  Filled 2020-02-02: qty 5

## 2020-02-02 MED ORDER — BENZTROPINE MESYLATE 1 MG PO TABS
1.0000 mg | ORAL_TABLET | Freq: Every day | ORAL | Status: DC
  Administered 2020-02-03 – 2020-02-07 (×5): 1 mg via ORAL
  Filled 2020-02-02 (×5): qty 1

## 2020-02-02 MED ORDER — APIXABAN 5 MG PO TABS
5.0000 mg | ORAL_TABLET | Freq: Two times a day (BID) | ORAL | Status: DC
  Administered 2020-02-02 – 2020-02-07 (×10): 5 mg via ORAL
  Filled 2020-02-02 (×10): qty 1

## 2020-02-02 MED ORDER — MOMETASONE FURO-FORMOTEROL FUM 100-5 MCG/ACT IN AERO
2.0000 | INHALATION_SPRAY | Freq: Two times a day (BID) | RESPIRATORY_TRACT | Status: DC
  Administered 2020-02-02 – 2020-02-07 (×10): 2 via RESPIRATORY_TRACT
  Filled 2020-02-02: qty 8.8

## 2020-02-02 MED ORDER — ALBUTEROL SULFATE HFA 108 (90 BASE) MCG/ACT IN AERS
2.0000 | INHALATION_SPRAY | Freq: Four times a day (QID) | RESPIRATORY_TRACT | Status: DC | PRN
  Filled 2020-02-02: qty 6.7

## 2020-02-02 MED ORDER — SODIUM CHLORIDE 0.9 % IV SOLN: Freq: Once | INTRAVENOUS | Status: AC

## 2020-02-02 MED ORDER — OXYCODONE HCL 5 MG PO TABS
5.0000 mg | ORAL_TABLET | Freq: Four times a day (QID) | ORAL | Status: DC | PRN
  Administered 2020-02-02 – 2020-02-03 (×3): 5 mg via ORAL
  Filled 2020-02-02 (×3): qty 1

## 2020-02-02 MED ORDER — MORPHINE SULFATE (PF) 4 MG/ML IV SOLN
4.0000 mg | Freq: Once | INTRAVENOUS | Status: AC
  Administered 2020-02-02: 4 mg via INTRAVENOUS

## 2020-02-02 MED ORDER — MORPHINE SULFATE (PF) 4 MG/ML IV SOLN
4.0000 mg | Freq: Once | INTRAVENOUS | Status: AC
  Administered 2020-02-02: 4 mg via INTRAVENOUS
  Filled 2020-02-02: qty 1

## 2020-02-02 MED ORDER — SODIUM CHLORIDE 0.9% FLUSH
3.0000 mL | Freq: Two times a day (BID) | INTRAVENOUS | Status: DC
  Administered 2020-02-02 – 2020-02-06 (×7): 3 mL via INTRAVENOUS

## 2020-02-02 MED ORDER — MONTELUKAST SODIUM 10 MG PO TABS
10.0000 mg | ORAL_TABLET | Freq: Every day | ORAL | Status: DC
  Administered 2020-02-03 – 2020-02-07 (×5): 10 mg via ORAL
  Filled 2020-02-02 (×5): qty 1

## 2020-02-02 MED ORDER — PANTOPRAZOLE SODIUM 40 MG PO TBEC
40.0000 mg | DELAYED_RELEASE_TABLET | Freq: Every day | ORAL | Status: DC
  Administered 2020-02-03 – 2020-02-07 (×5): 40 mg via ORAL
  Filled 2020-02-02 (×5): qty 1

## 2020-02-02 MED ORDER — HALOPERIDOL 5 MG PO TABS
10.0000 mg | ORAL_TABLET | Freq: Two times a day (BID) | ORAL | Status: DC
  Administered 2020-02-02 – 2020-02-07 (×10): 10 mg via ORAL
  Filled 2020-02-02 (×11): qty 2

## 2020-02-02 NOTE — ED Notes (Signed)
Pt attempted to ambulate with walker with this RN and chris, rn assistance. Pt only able to ambulate approximately 20 feet before having to sit down. Pt c/o severe pain with ambulation. Pt put in wheelchair and assisted back to bed by this RN and Thayer Ohm, Charity fundraiser. Pain medicine administered per order from MD.

## 2020-02-02 NOTE — ED Notes (Signed)
Report given to Jane RN

## 2020-02-02 NOTE — Consult Note (Signed)
ORTHOPAEDIC CONSULTATION  REQUESTING PHYSICIAN: Wouk, Ailene Rud, MD  Chief Complaint:   Left hip pain.  History of Present Illness: Adrian Gillespie is a 70 y.o. male with a history of COPD, anxiety/depression, hyperlipidemia, hypertension, and schizophrenia who was in his usual state of health yesterday evening.  While at a grocery store, he slipped and fell on a wet floor, landing on his left hip.  He was brought to the emergency room where x-rays demonstrated a an essentially nondisplaced fracture of the left greater trochanter.  The patient's past medical history is notable for having undergone a left total hip arthroplasty at [facility] approximately 6 years ago for a left femoral neck fracture.  The patient had been doing quite well following this procedure until the fall yesterday evening.  The patient denies any associated injuries.  He did not strike his head or lose consciousness.  The patient also denies any lightheadedness, dizziness, chest pain, shortness of breath, or other symptoms which may have precipitated his fall.  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 No pertinent surgical history. Social History   Socioeconomic History   Marital status: Single    Spouse name: Not on file   Number of children: Not on file   Years of education: Not on file   Highest education level: Not on file  Occupational History   Not on file  Tobacco Use   Smoking status: Current Every Day Smoker    Packs/day: 1.00    Types: Cigarettes   Smokeless tobacco: Never Used   Tobacco comment: quit 1 month ago  Substance  and Sexual Activity   Alcohol use: Not on file   Drug use: Not on file   Sexual activity: Not on file  Other Topics Concern   Not on file  Social History Narrative   Not on file   Social Determinants of Health   Financial Resource Strain: Not on file  Food Insecurity: Not on file  Transportation Needs: Not on file  Physical Activity: Not on file  Stress: Not on file  Social Connections: Not on file   Family History  Problem Relation Age of Onset   Arthritis/Rheumatoid Mother    No Known Allergies Prior to Admission medications   Medication Sig Start Date End Date Taking? Authorizing Provider  ADVAIR HFA [phone] MCG/ACT inhaler Inhale 2 puffs into the lungs 2 (two) times daily. 01/10/20  Yes [provider]  albuterol (VENTOLIN HFA) 108 (90 Base) MCG/ACT inhaler Inhale 2 puffs into the lungs every 6 (six) hours as needed for shortness of breath or wheezing. 04/05/19  Yes Dhungel, Nishant, MD  apixaban (ELIQUIS) 5 MG TABS tablet Take 1 tablet (5 mg total) by mouth 2 (two) times daily. 04/23/19  Yes Lavina Hamman, MD  benzonatate (TESSALON) 100 MG capsule Take 1 capsule (100 mg total) by mouth 3 (three) times daily. Patient taking differently: Take 100 mg by mouth 3 (three) times daily as needed. 04/23/19  Yes Lavina Hamman, MD  benztropine (COGENTIN) 1 MG tablet Take 1 mg by mouth daily.   Yes [provider]  BEVESPI AEROSPHERE 9-4.8 MCG/ACT AERO  Inhale 2 puffs into the lungs at bedtime.  03/31/19  Yes [provider]  guaiFENesin-dextromethorphan (ROBITUSSIN DM) 100-10 MG/5ML syrup Take 5 mLs by mouth every 4 (four) hours as needed for cough. 04/05/19  Yes Dhungel, Nishant, MD  haloperidol (HALDOL) 10 MG tablet Take 10 mg by mouth 2 (two) times daily.   Yes [provider]  hydrOXYzine (VISTARIL) 50 MG capsule Take 50 mg by mouth 2 (two) times daily as needed for anxiety.  04/14/19  Yes [provider]  INVEGA SUSTENNA 234 MG/1.5ML SUSY  injection Inject 234 mg into the muscle every 30 (thirty) days. 04/18/19  Yes [provider]  montelukast (SINGULAIR) 10 MG tablet Take 10 mg by mouth daily. 11/25/19  Yes [provider]  nicotine (NICODERM CQ - DOSED IN MG/24 HOURS) 21 mg/24hr patch Place 1 patch (21 mg total) onto the skin daily. 05/26/19  Yes Dhungel, Nishant, MD  omeprazole (PRILOSEC) 20 MG capsule Take 20 mg by mouth daily.   Yes [provider]  tiotropium (SPIRIVA HANDIHALER) 18 MCG inhalation capsule Place 1 capsule (18 mcg total) into inhaler and inhale daily. 04/05/19  Yes Dhungel, Nishant, MD  traZODone (DESYREL) 100 MG tablet Take 300 mg by mouth at bedtime.   Yes [provider]  feeding supplement, ENSURE ENLIVE, (ENSURE ENLIVE) LIQD Take 237 mLs by mouth 2 (two) times daily between meals. 05/26/19   Eddie North, MD   DG Chest Port 1 View  Result Date: 02/02/2020 CLINICAL DATA:  Left hip fracture, preoperative examination EXAM: PORTABLE CHEST 1 VIEW COMPARISON:  05/23/2019 FINDINGS: The heart size and mediastinal contours are within normal limits. Both lungs are clear. The visualized skeletal structures are unremarkable. IMPRESSION: No active disease. Electronically Signed   By: Helyn Numbers MD   On: 02/02/2020 02:09   DG Hip Unilat W or Wo Pelvis 2-3 Views Left  Result Date: 02/01/2020 CLINICAL DATA:  Status post fall. EXAM: DG HIP (WITH OR WITHOUT PELVIS) 2-3V LEFT COMPARISON:  None. FINDINGS: A total left hip replacement is seen. There is no evidence of surrounding lucency to suggest the presence of hardware loosening or infection. Acute, mildly displaced fracture deformity is seen involving the lateral aspect of the proximal left femoral shaft. This is adjacent to the distal aspect of the left greater trochanter. There is no evidence of dislocation. Mild soft tissue swelling is seen along the lateral aspect of the left hip. IMPRESSION: 1. Acute fracture of the proximal left femur.  2. Left hip replacement without evidence of hardware loosening or infection. Electronically Signed   By: Aram Candela M.D.   On: 02/01/2020 21:33    Positive ROS: All other systems have been reviewed and were otherwise negative with the exception of those mentioned in the HPI and as above.  Physical Exam: General:  Alert, no acute distress Psychiatric:  Patient is competent for consent with normal mood and affect   Cardiovascular:  No pedal edema Respiratory:  No wheezing, non-labored breathing GI:  Abdomen is soft and non-tender Skin:  No lesions in the area of chief complaint Neurologic:  Sensation intact distally Lymphatic:  No axillary or cervical lymphadenopathy  Orthopedic Exam:  Orthopedic examination is limited to the left hip and lower extremity.  Skin inspection is notable for a well-healed surgical incision over the posterior lateral aspect of the left hip, as well as a crusty benign-appearing skin lesion just distal to the incision.  There also appears to be mild swelling around the  hip, but no erythema, ecchymosis, abrasions, or other skin abnormalities are identified.  He has mild to moderate tenderness to palpation over the lateral aspect of the hip.  He has more severe pain with any attempted active or passive motion of the hip.  He is able to actively dorsiflex and plantarflex his toes and ankle.  Sensation is intact light touch to all distributions.  He has good capillary refill to his left foot.  X-rays:  X-rays of the pelvis and left hip are available for review and have been reviewed by myself.  These films demonstrate a nondisplaced greater trochanteric fracture.  The left total hip arthroplasty appears to be in good position and without evidence of loosening.  No lytic lesions or other acute bony processes are identified.  Assessment: Nondisplaced periprosthetic greater trochanteric fracture, left hip.  Plan: The treatment options have been discussed with the  patient.  The patient understands that this is a fracture pattern that does not require surgical intervention.  He is to be managed with appropriate pain medication for pain control.  He may be mobilized with physical therapy toe-touch to partial weightbearing on the left leg with a walker as symptoms permit.  Thank you for asking me to participate in the care of this most pleasant yet unfortunate man.  I will be happy to follow him with you.   Maryagnes Amos, MD  Beeper #:  [phone]  02/02/2020 4:45 PM

## 2020-02-02 NOTE — ED Notes (Signed)
Pt assisted back to bed by this RN and Mayra NT at this time. Pt tolerated well.

## 2020-02-02 NOTE — Plan of Care (Signed)
  Problem: Education: Goal: Knowledge of General Education information will improve Description Including pain rating scale, medication(s)/side effects and non-pharmacologic comfort measures Outcome: Progressing   Problem: Health Behavior/Discharge Planning: Goal: Ability to manage health-related needs will improve Outcome: Progressing   Problem: Clinical Measurements: Goal: Ability to maintain clinical measurements within normal limits will improve Outcome: Progressing Goal: Will remain free from infection Outcome: Progressing Goal: Diagnostic test results will improve Outcome: Progressing Goal: Respiratory complications will improve Outcome: Progressing Goal: Cardiovascular complication will be avoided Outcome: Progressing   Problem: Activity: Goal: Risk for activity intolerance will decrease Outcome: Progressing   Problem: Nutrition: Goal: Adequate nutrition will be maintained Outcome: Progressing   Problem: Coping: Goal: Level of anxiety will decrease Outcome: Progressing   Problem: Elimination: Goal: Will not experience complications related to bowel motility Outcome: Progressing Goal: Will not experience complications related to urinary retention Outcome: Progressing   Problem: Pain Managment: Goal: General experience of comfort will improve Outcome: Progressing   Problem: Safety: Goal: Ability to remain free from injury will improve Outcome: Progressing   Problem: Skin Integrity: Goal: Risk for impaired skin integrity will decrease Outcome: Progressing   Problem: Education: Goal: Verbalization of understanding the information provided (i.e., activity precautions, restrictions, etc) will improve Outcome: Progressing Goal: Individualized Educational Video(s) Outcome: Progressing   Problem: Activity: Goal: Ability to ambulate and perform ADLs will improve Outcome: Progressing   Problem: Clinical Measurements: Goal: Postoperative complications will be  avoided or minimized Outcome: Progressing   

## 2020-02-02 NOTE — ED Notes (Signed)
No response from [place], Charity fundraiser. [employer] technical officer. This RN called and spoke with unit secretary who will alert care RN. Pt assigned to bed for 41 minutes

## 2020-02-02 NOTE — ED Notes (Signed)
Pt assisted into wheelchair at this time. Pt wheeled to bathroom down hall at this time. Pt assisted from wheelchair to toilet by this RN, pt tolerated well. Pt reminded to pull red cord when finished using toilet for assistance at this time, pt verbalized understanding.

## 2020-02-02 NOTE — ED Notes (Signed)
Messaged Erie Noe, RN to initiate handoff

## 2020-02-02 NOTE — ED Provider Notes (Signed)
[facility] Emergency Department Provider Note  ____________________________________________   Event Date/Time   First MD Initiated Contact with Patient 02/02/20 0211     (approximate)  I have reviewed the triage vital signs and the nursing notes.   HISTORY  Chief Complaint Fall and Hip Pain    HPI Adrian Gillespie is a 70 y.o. male with medical history as listed below which notably includes  a prior left hip replacement at [facility] years ago.  He presents tonight for evaluation of acute onset and severe pain in the left hip after a fall at a grocery store.  He said that he slipped in some water and landed on his left hip.  The pain was immediate and severe and he cannot bear weight on the left leg.  He had to crawl out of the store and then was transported by EMS to the emergency department.  He said that any amount of moving makes the pain worse, nothing particular makes it better.  No numbness or tingling.  He denies hitting his head or losing consciousness.  He denies headache, neck pain, chest pain, shortness of breath, nausea, vomiting, and abdominal pain.        Past Medical History:  Diagnosis Date  . Anxiety   . 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 COPD (chronic obstructive pulmonary disease) (HCC)   . Depression   . GERD (gastroesophageal reflux disease)   . 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 HLD (hyperlipidemia)   . HTN (hypertension)   . Schizophrenia [facility])     Patient Active Problem List   Diagnosis Date Noted  . Intractable pain 02/02/2020  . Malnutrition of moderate degree 05/26/2019  . Acute respiratory failure with hypoxemia (HCC) 05/26/2019  . CAP (community acquired pneumonia) 04/20/2019  . PVC (premature ventricular contraction) 04/20/2019  . GERD (gastroesophageal reflux disease) 04/20/2019  . Acute on chronic respiratory failure with hypoxia (HCC)   . HTN (hypertension)   . COPD exacerbation  (HCC)   . NSVT (nonsustained ventricular tachycardia) (HCC)   . COPD with acute bronchitis (HCC) 04/02/2019  . Acute respiratory failure with hypoxia (HCC) 04/02/2019  . Hypokalemia 04/02/2019  . Schizophrenia (HCC) 04/02/2019    History reviewed. No pertinent surgical history.  Prior to Admission medications   Medication Sig Start Date End Date Taking? Authorizing Provider  ADVAIR HFA [phone] MCG/ACT inhaler Inhale 2 puffs into the lungs 2 (two) times daily. 01/10/20  Yes [provider]  albuterol (VENTOLIN HFA) 108 (90 Base) MCG/ACT inhaler Inhale 2 puffs into the lungs every 6 (six) hours as needed for shortness of breath or wheezing. 04/05/19  Yes Dhungel, Nishant, MD  apixaban (ELIQUIS) 5 MG TABS tablet Take 1 tablet (5 mg total) by mouth 2 (two) times daily. 04/23/19  Yes Rolly Salter, MD  benzonatate (TESSALON) 100 MG capsule Take 1 capsule (100 mg total) by mouth 3 (three) times daily. Patient taking differently: Take 100 mg by mouth 3 (three) times daily as needed. 04/23/19  Yes Rolly Salter, MD  benztropine (COGENTIN) 1 MG tablet Take 1 mg by mouth daily.   Yes [provider]  BEVESPI AEROSPHERE 9-4.8 MCG/ACT AERO Inhale 2 puffs into the lungs at bedtime.  03/31/19  Yes [provider]  guaiFENesin-dextromethorphan (ROBITUSSIN DM) 100-10 MG/5ML syrup Take 5 mLs by mouth every 4 (four) hours as needed for cough. 04/05/19  Yes Dhungel, Theda Belfast, MD  haloperidol (HALDOL)  10 MG tablet Take 10 mg by mouth 2 (two) times daily.   Yes [provider]  hydrOXYzine (VISTARIL) 50 MG capsule Take 50 mg by mouth 2 (two) times daily as needed for anxiety.  04/14/19  Yes [provider]  INVEGA SUSTENNA 234 MG/1.5ML SUSY injection Inject 234 mg into the muscle every 30 (thirty) days. 04/18/19  Yes [provider]  montelukast (SINGULAIR) 10 MG tablet Take 10 mg by mouth daily. 11/25/19  Yes [provider]  nicotine (NICODERM CQ - DOSED IN  MG/24 HOURS) 21 mg/24hr patch Place 1 patch (21 mg total) onto the skin daily. 05/26/19  Yes Dhungel, Nishant, MD  omeprazole (PRILOSEC) 20 MG capsule Take 20 mg by mouth daily.   Yes [provider]  predniSONE (DELTASONE) 20 MG tablet Take 1 tablet (20 mg total) by mouth daily with breakfast. 05/26/19  Yes Dhungel, Nishant, MD  tiotropium (SPIRIVA HANDIHALER) 18 MCG inhalation capsule Place 1 capsule (18 mcg total) into inhaler and inhale daily. 04/05/19  Yes Dhungel, Nishant, MD  traZODone (DESYREL) 100 MG tablet Take 300 mg by mouth at bedtime.   Yes [provider]  TRELEGY ELLIPTA 100-62.5-25 MCG/INH AEPB Inhale 1 puff into the lungs daily. 10/12/19  Yes [provider]  feeding supplement, ENSURE ENLIVE, (ENSURE ENLIVE) LIQD Take 237 mLs by mouth 2 (two) times daily between meals. 05/26/19   Dhungel, Theda Belfast, MD    Allergies Patient has no known allergies.  Family History  Problem Relation Age of Onset  . Arthritis/Rheumatoid Mother     Social History Social History   Tobacco Use  . Smoking status: Current Every Day Smoker    Packs/day: 1.00    Types: Cigarettes  . Smokeless tobacco: Never Used  . Tobacco comment: quit 1 month ago    Review of Systems Constitutional: No fever/chills Eyes: No visual changes. ENT: No sore throat. Cardiovascular: Denies chest pain. Respiratory: Denies shortness of breath. Gastrointestinal: No abdominal pain.  No nausea, no vomiting.  No diarrhea.  No constipation. Genitourinary: Negative for dysuria. Musculoskeletal: Positive for left hip pain, negative for head and neck pain. Integumentary: Negative for rash. Neurological: Negative for headaches, focal weakness or numbness.   ____________________________________________   PHYSICAL EXAM:  VITAL SIGNS: ED Triage Vitals  Enc Vitals Group     BP 02/01/20 2134 122/90     Pulse Rate 02/01/20 2134 96     Resp 02/01/20 2134 16     Temp 02/01/20 2134 98.5 F (36.9  C)     Temp Source 02/01/20 2134 Oral     SpO2 02/01/20 2134 97 %     Weight 02/01/20 2135 75.4 kg (166 lb 3.6 oz)     Height 02/01/20 2135 1.753 m (5\' 9" )     Head Circumference --      Peak Flow --      Pain Score 02/01/20 2135 10     Pain Loc --      Pain Edu? --      Excl. in GC? --     Constitutional: Alert and oriented.  Eyes: Conjunctivae are normal.  Head: Atraumatic. Nose: No congestion/rhinnorhea. Mouth/Throat: Patient is wearing a mask. Neck: No stridor.  No meningeal signs.   Cardiovascular: Normal rate, regular rhythm. Good peripheral circulation. Respiratory: Normal respiratory effort.  No retractions. Gastrointestinal: Soft and nontender. No distention.  Musculoskeletal: No lower extremity tenderness nor edema. No gross deformities of extremities.  Tenderness to palpation of the left proximal femur  but without obvious deformity. Neurologic:  Normal speech and language. No gross focal neurologic deficits are appreciated.  Skin:  Skin is warm, dry and intact. Psychiatric: Mood and affect are normal. Speech and behavior are normal.  ____________________________________________   LABS (all labs ordered are listed, but only abnormal results are displayed)  Labs Reviewed  COMPREHENSIVE METABOLIC PANEL - Abnormal; Notable for the following components:      Result Value   CO2 20 (*)    Glucose, Bld 138 (*)    BUN 7 (*)    Calcium 8.7 (*)    Total Protein 6.2 (*)    All other components within normal limits  CBC WITH DIFFERENTIAL/PLATELET - Abnormal; Notable for the following components:   WBC 12.9 (*)    Neutro Abs 9.3 (*)    All other components within normal limits  APTT - Abnormal; Notable for the following components:   aPTT 38 (*)    All other components within normal limits  PROTIME-INR  TYPE AND SCREEN  TYPE AND SCREEN   ____________________________________________  EKG  ED ECG REPORT I, Loleta Rose, the attending physician, personally viewed  and interpreted this ECG.  Date: 02/02/2020 EKG Time: 2:41 AM Rate: 87 Rhythm: Sinus rhythm with first-degree AV block with occasional PVC QRS Axis: normal Intervals: normal ST/T Wave abnormalities: Non-specific ST segment / T-wave changes, but no clear evidence of acute ischemia. Narrative Interpretation: no definitive evidence of acute ischemia; does not meet STEMI criteria.   ____________________________________________  RADIOLOGY I, Loleta Rose, personally viewed and evaluated these images (plain radiographs) as part of my medical decision making, as well as reviewing the written report by the radiologist.  ED MD interpretation: Left nondisplaced greater trochanter fracture.  No obvious displacement of hardware.  Official radiology report(s): DG Chest Port 1 View  Result Date: 02/02/2020 CLINICAL DATA:  Left hip fracture, preoperative examination EXAM: PORTABLE CHEST 1 VIEW COMPARISON:  05/23/2019 FINDINGS: The heart size and mediastinal contours are within normal limits. Both lungs are clear. The visualized skeletal structures are unremarkable. IMPRESSION: No active disease. Electronically Signed   By: Helyn Numbers MD   On: 02/02/2020 02:09   DG Hip Unilat W or Wo Pelvis 2-3 Views Left  Result Date: 02/01/2020 CLINICAL DATA:  Status post fall. EXAM: DG HIP (WITH OR WITHOUT PELVIS) 2-3V LEFT COMPARISON:  None. FINDINGS: A total left hip replacement is seen. There is no evidence of surrounding lucency to suggest the presence of hardware loosening or infection. Acute, mildly displaced fracture deformity is seen involving the lateral aspect of the proximal left femoral shaft. This is adjacent to the distal aspect of the left greater trochanter. There is no evidence of dislocation. Mild soft tissue swelling is seen along the lateral aspect of the left hip. IMPRESSION: 1. Acute fracture of the proximal left femur. 2. Left hip replacement without evidence of hardware loosening or infection.  Electronically Signed   By: Aram Candela M.D.   On: 02/01/2020 21:33    ____________________________________________   PROCEDURES   Procedure(s) performed (including Critical Care):  Procedures   ____________________________________________   INITIAL IMPRESSION / MDM / ASSESSMENT AND PLAN / ED COURSE  As part of my medical decision making, I reviewed the following data within the electronic MEDICAL RECORD NUMBER Nursing notes reviewed and incorporated, Labs reviewed , EKG interpreted , Old chart reviewed, Radiograph reviewed , Discussed with radiologist, Notes from prior ED visits and [place] Controlled Substance Database   Differential diagnosis includes, but is  not limited to, hip/femur fracture, hardware malfunction or displacement, musculoskeletal strain.  Vital signs are stable and within normal limits.  I personally reviewed the patient's imaging and agree with the radiologist's interpretation that there is a left greater trochanter fracture that is nondisplaced.  No acute abnormalities identified on chest x-ray.  Lab work is pending.  Patient received fentanyl 50 mcg IV and said that it is not helped, I am giving him morphine 4 mg IV.  Will discuss case with orthopedics.     Clinical Course as of 02/02/20 0735  Thu Feb 02, 2020  0231 Discussed case by phone with Dr. Joice Lofts with orthopedics.  He reviewed the x-rays and said that this injury can be managed nonoperatively with toe-touch weightbearing and a walker.  I will give the patient the opportunity to have the morphine work a bit and that we will attempt ambulation with a walker to determine if outpatient management and pain control will be possible. [CF]  0343 Patient's pain is currently well controlled.  I updated him regarding the plan and he is willing to try walking with a walker and touchdown weightbearing [CF]  0420 Patient failed trial of ambulation.  He was able to take a couple of steps but was very unsteady, was  unable to pivot, and almost fell.  This also caused recurrent severe pain.  I am ordering another dose of morphine 4 mg IV and will consult the hospitalist for admission for pain control, orthopedic consultation given that outpatient management does not seem likely, and PT OT evaluation. [CF]  [phone] Discussed case by phone with Dr. Para March with the hospitalist service, and she will put in admission orders.  Given overhwelming patient volume right now, the daytime hospitalist will likely be the one to physically evaluate the patient. [CF]    Clinical Course User Index [CF] Loleta Rose, MD     ____________________________________________  FINAL CLINICAL IMPRESSION(S) / ED DIAGNOSES  Final diagnoses:  Closed nondisplaced fracture of greater trochanter of left femur, initial encounter (HCC)  Intractable pain     MEDICATIONS GIVEN DURING THIS VISIT:  Medications  morphine 4 MG/ML injection 4 mg (4 mg Intravenous Not Given 02/02/20 0424)  morphine 4 MG/ML injection 4 mg (has no administration in time range)  fentaNYL (SUBLIMAZE) injection 50 mcg (50 mcg Intravenous Given 02/02/20 0111)  morphine 4 MG/ML injection 4 mg (4 mg Intravenous Given 02/02/20 0241)  morphine 4 MG/ML injection 4 mg (4 mg Intravenous Given 02/02/20 0421)  0.9 %  sodium chloride infusion ( Intravenous New Bag/Given 02/02/20 0541)     ED Discharge Orders    None      *Please note:  Adrian Gillespie was evaluated in Emergency Department on 02/02/2020 for the symptoms described in the history of present illness. He was evaluated in the context of the global COVID-19 pandemic, which necessitated consideration that the patient might be at risk for infection with the SARS-CoV-2 virus that causes COVID-19. Institutional protocols and algorithms that pertain to the evaluation of patients at risk for COVID-19 are in a state of rapid change based on information released by regulatory bodies including the CDC and federal and state  organizations. These policies and algorithms were followed during the patient's care in the ED.  Some ED evaluations and interventions may be delayed as a result of limited staffing during and after the pandemic.*  Note:  This document was prepared using Dragon voice recognition software and may include unintentional dictation errors.   York Cerise,  Tommi Rumps, MD 02/02/20 [phone]

## 2020-02-02 NOTE — Progress Notes (Signed)
OT Cancellation Note  Patient Details Name: Adrian Gillespie MRN: [id number] DOB: 1950/11/09   Cancelled Treatment:    Reason Eval/Treat Not Completed: Medical issues which prohibited therapy. Per chart review pt with acute L proximal femur fx, and has attempted mobility with RN, and pt unable to tolerate due to significant pain. Orthopedic consult pending per chart, OT to monitor and assess pt when clear POC is established.  Richrd Prime, MPH, MS, OTR/L ascom [phone] 02/02/20, 1:51 PM

## 2020-02-02 NOTE — H&P (Addendum)
History and Physical    Adrian Gillespie ONG:[id number] DOB: 09/13/1950 DOA: 02/02/2020  PCP: Andres Shad, MD  Patient coming from: home   Chief Complaint: fall  HPI: Adrian Gillespie is a 70 y.o. male with medical history significant for asthma, schizophrenia, htn, dementia, a fib, who presents with the above.  Was in usual state of health yesterday when tripped and fell in the grocery store. Landed on left side. No head injury or pain, no LOC. Has pain in left hip, ok when resting, but very painful when he ambulates or attempts to move the left leg. History of hip replacement left hip in 2017. Pain does not radiate.   ED Course:   X ray reveals left proximal femur fracture. Left hip replacement hardware intact. edp discussed w/ ortho (poggi) who said no indication for surgery, plan toe touch wt bearing, walker, and outpt f/u. However, due to intractable pain with attempted ambulation, decision made to admit for pain control.   Review of Systems: As per HPI otherwise 10 point review of systems negative.    Past Medical History:  Diagnosis Date  . Anxiety   . 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 COPD (chronic obstructive pulmonary disease) (HCC)   . Depression   . GERD (gastroesophageal reflux disease)   . 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 HLD (hyperlipidemia)   . HTN (hypertension)   . Schizophrenia (HCC)     History reviewed. No pertinent surgical history.   reports that he has been smoking cigarettes. He has been smoking about 1.00 pack per day. He has never used smokeless tobacco. No history on file for alcohol use and drug use.  No Known Allergies  Family History  Problem Relation Age of Onset  . Arthritis/Rheumatoid Mother     Prior to Admission medications   Medication Sig Start Date End Date Taking? Authorizing Provider  ADVAIR HFA [phone] MCG/ACT inhaler Inhale 2 puffs into the lungs 2 (two) times daily. 01/10/20  Yes [provider]  albuterol (VENTOLIN HFA) 108 (90 Base) MCG/ACT inhaler Inhale 2 puffs into the lungs every 6 (six) hours as needed for shortness of breath or wheezing. 04/05/19  Yes Dhungel, Nishant, MD  apixaban (ELIQUIS) 5 MG TABS tablet Take 1 tablet (5 mg total) by mouth 2 (two) times daily. 04/23/19  Yes Rolly Salter, MD  benzonatate (TESSALON) 100 MG capsule Take 1 capsule (100 mg total) by mouth 3 (three) times daily. Patient taking differently: Take 100 mg by mouth 3 (three) times daily as needed. 04/23/19  Yes Rolly Salter, MD  benztropine (COGENTIN) 1 MG tablet Take 1 mg by mouth daily.   Yes [provider]  BEVESPI AEROSPHERE 9-4.8 MCG/ACT AERO Inhale 2 puffs into the lungs at bedtime.  03/31/19  Yes [provider]  guaiFENesin-dextromethorphan (ROBITUSSIN DM) 100-10 MG/5ML syrup Take 5 mLs by mouth every 4 (four) hours as needed for cough. 04/05/19  Yes Dhungel, Nishant, MD  haloperidol (HALDOL) 10 MG tablet Take 10 mg by mouth 2 (two) times daily.   Yes [provider]  hydrOXYzine (VISTARIL) 50 MG capsule Take 50 mg by mouth 2 (two) times daily as needed for anxiety.  04/14/19  Yes [provider]  INVEGA SUSTENNA 234 MG/1.5ML SUSY injection Inject 234 mg into the muscle every 30 (thirty) days. 04/18/19  Yes [provider]  montelukast (SINGULAIR) 10 MG tablet Take 10 mg by mouth daily. 11/25/19  Yes  [provider]  nicotine (NICODERM CQ - DOSED IN MG/24 HOURS) 21 mg/24hr patch Place 1 patch (21 mg total) onto the skin daily. 05/26/19  Yes Dhungel, Nishant, MD  omeprazole (PRILOSEC) 20 MG capsule Take 20 mg by mouth daily.   Yes [provider]  tiotropium (SPIRIVA HANDIHALER) 18 MCG inhalation capsule Place 1 capsule (18 mcg total) into inhaler and inhale daily. 04/05/19  Yes Dhungel, Nishant, MD  traZODone (DESYREL) 100 MG tablet Take 300 mg by mouth at bedtime.   Yes [provider]  feeding supplement, ENSURE  ENLIVE, (ENSURE ENLIVE) LIQD Take 237 mLs by mouth 2 (two) times daily between meals. 05/26/19   Louellen Molder, MD    Physical Exam: Vitals:   02/02/20 0227 02/02/20 0753 02/02/20 0834 02/02/20 1210  BP: 134/72  (!) 120/95 (!) 126/96  Pulse: 77  80 81  Resp: 17  18 (!) 22  Temp:      TempSrc:      SpO2: 98%  98% 100%  Weight:  75.4 kg    Height:  5\' 9"  (1.753 m)      Constitutional: No acute distress Head: Atraumatic Eyes: Conjunctiva clear ENM: Moist mucous membranes. Normal dentition.  Neck: Supple Respiratory: Clear to auscultation bilaterally, no wheezing/rales/rhonchi. Normal respiratory effort. No accessory muscle use. . Cardiovascular: Regular rate and rhythm. No murmurs/rubs/gallops. Abdomen: Non-tender, non-distended. No masses. No rebound or guarding. Positive bowel sounds. Musculoskeletal: No joint deformity upper and lower extremities. Normal ROM, no contractures. Normal muscle tone. Pain with movement of left hip. Skin: No rashes, lesions, or ulcers.  Extremities: No peripheral edema. Palpable peripheral pulses. Neurologic: Alert, moving all 4 extremities. Distal sensation intact left leg. Psychiatric: pleasant affect   Labs on Admission: I have personally reviewed following labs and imaging studies  CBC: Recent Labs  Lab 02/02/20 0219  WBC 12.9*  NEUTROABS 9.3*  HGB 14.3  HCT 42.4  MCV 82.7  PLT 427   Basic Metabolic Panel: Recent Labs  Lab 02/02/20 0219  NA 135  K 3.9  CL 104  CO2 20*  GLUCOSE 138*  BUN 7*  CREATININE 1.03  CALCIUM 8.7*   GFR: Estimated Creatinine Clearance: 68.6 mL/min (by C-G formula based on SCr of 1.03 mg/dL). Liver Function Tests: Recent Labs  Lab 02/02/20 0219  AST 20  ALT 13  ALKPHOS 62  BILITOT 1.0  PROT 6.2*  ALBUMIN 3.6   No results for input(s): LIPASE, AMYLASE in the last 168 hours. No results for input(s): AMMONIA in the last 168 hours. Coagulation Profile: Recent Labs  Lab 02/02/20 0219  INR  1.2   Cardiac Enzymes: No results for input(s): CKTOTAL, CKMB, CKMBINDEX, TROPONINI in the last 168 hours. BNP (last 3 results) No results for input(s): PROBNP in the last 8760 hours. HbA1C: No results for input(s): HGBA1C in the last 72 hours. CBG: No results for input(s): GLUCAP in the last 168 hours. Lipid Profile: No results for input(s): CHOL, HDL, LDLCALC, TRIG, CHOLHDL, LDLDIRECT in the last 72 hours. Thyroid Function Tests: No results for input(s): TSH, T4TOTAL, FREET4, T3FREE, THYROIDAB in the last 72 hours. Anemia Panel: No results for input(s): VITAMINB12, FOLATE, FERRITIN, TIBC, IRON, RETICCTPCT in the last 72 hours. Urine analysis:    Component Value Date/Time   COLORURINE YELLOW (A) 05/23/2019 2349   APPEARANCEUR CLEAR (A) 05/23/2019 2349   LABSPEC 1.010 05/23/2019 2349   PHURINE 6.0 05/23/2019 [place] 05/23/2019 2349   HGBUR NEGATIVE 05/23/2019 2349  BILIRUBINUR NEGATIVE 05/23/2019 2349   KETONESUR NEGATIVE 05/23/2019 2349   PROTEINUR NEGATIVE 05/23/2019 2349   NITRITE NEGATIVE 05/23/2019 2349   LEUKOCYTESUR NEGATIVE 05/23/2019 2349    Radiological Exams on Admission: DG Chest Port 1 View  Result Date: 02/02/2020 CLINICAL DATA:  Left hip fracture, preoperative examination EXAM: PORTABLE CHEST 1 VIEW COMPARISON:  05/23/2019 FINDINGS: The heart size and mediastinal contours are within normal limits. Both lungs are clear. The visualized skeletal structures are unremarkable. IMPRESSION: No active disease. Electronically Signed   By: Helyn Numbers MD   On: 02/02/2020 02:09   DG Hip Unilat W or Wo Pelvis 2-3 Views Left  Result Date: 02/01/2020 CLINICAL DATA:  Status post fall. EXAM: DG HIP (WITH OR WITHOUT PELVIS) 2-3V LEFT COMPARISON:  None. FINDINGS: A total left hip replacement is seen. There is no evidence of surrounding lucency to suggest the presence of hardware loosening or infection. Acute, mildly displaced fracture deformity is seen involving  the lateral aspect of the proximal left femoral shaft. This is adjacent to the distal aspect of the left greater trochanter. There is no evidence of dislocation. Mild soft tissue swelling is seen along the lateral aspect of the left hip. IMPRESSION: 1. Acute fracture of the proximal left femur. 2. Left hip replacement without evidence of hardware loosening or infection. Electronically Signed   By: Aram Candela M.D.   On: 02/01/2020 21:33    EKG: Independently reviewed. nsr  Assessment/Plan Principal Problem:   Femur fracture, left (HCC) Active Problems:   HTN (hypertension)   Intractable pain   Eosinophilic asthma   AF (paroxysmal atrial fibrillation) (HCC)   # Left proximal closed femur fracture Hx hip replacement, hardware appears intact. Ortho dr. Joice Lofts advises non-intervention - touch down wt bearing with walker, outpt f/u. Intractable pain w/ ambulation. Currently pain free when lying still - continue morphine prn - touch down wt bearing, walker ordered - pt/ot consults ordered - outpt ortho f/u - f/u covid pcr  # schizophrenia Affect appropriate - cont home haldol, trazodone, cogentin  # Asthma quiescent - cont home singulair, dulera for home advair, and spiriva  # htn Here bp wnl, not on home meds - monitor  # paroxysmal a fib Here in sinus - cont home eliquis    DVT prophylaxis: eliquis Code Status: full  Family Communication: updated patient's brother telephonically [phone]) on 1/6  Consults called: orthopedics (poggi)    Status is: inpt  The patient will require care spanning > 2 midnights and should be moved to inpatient because: Inpatient level of care appropriate due to severity of illness  Dispo: The patient is from: Home              Anticipated d/c is to: tbd              Anticipated d/c date is: 1-2 days              Patient currently is not medically stable to d/c.        Silvano Bilis MD [facility] Pager  [phone]  If 7PM-7AM, please contact night-coverage [email or web address] Password [facility]  02/02/2020, 12:59 PM

## 2020-02-02 NOTE — ED Notes (Signed)
Pt ambulated to bathroom using walker x 1 assist. Pt tolerated well. States, "it's painful but I can do it."

## 2020-02-02 NOTE — Progress Notes (Signed)
PT Cancellation Note  Patient Details Name: Adrian Gillespie MRN: [id number] DOB: 04-27-1950   Cancelled Treatment:    Reason Eval/Treat Not Completed: Other (comment). Per chart review pt with acute L proximal femur fx, and has attempted mobility with RN, and pt unable to tolerate due to significant pain. Orthopedic consult pending per chart, PT to monitor and assess pt when clear POC is established.  Olga Coaster PT, DPT 11:01 AM,02/02/20

## 2020-02-03 DIAGNOSIS — I480 Paroxysmal atrial fibrillation: Secondary | ICD-10-CM | POA: Diagnosis not present

## 2020-02-03 DIAGNOSIS — S7292XA Unspecified fracture of left femur, initial encounter for closed fracture: Secondary | ICD-10-CM | POA: Diagnosis not present

## 2020-02-03 DIAGNOSIS — F209 Schizophrenia, unspecified: Secondary | ICD-10-CM

## 2020-02-03 DIAGNOSIS — J449 Chronic obstructive pulmonary disease, unspecified: Secondary | ICD-10-CM

## 2020-02-03 MED ORDER — OXYCODONE HCL 5 MG PO TABS
10.0000 mg | ORAL_TABLET | Freq: Four times a day (QID) | ORAL | Status: DC | PRN
  Administered 2020-02-03 – 2020-02-06 (×6): 10 mg via ORAL
  Filled 2020-02-03 (×6): qty 2

## 2020-02-03 MED ORDER — MORPHINE SULFATE (PF) 2 MG/ML IV SOLN
2.0000 mg | INTRAVENOUS | Status: DC | PRN
  Administered 2020-02-03 – 2020-02-05 (×4): 2 mg via INTRAVENOUS
  Filled 2020-02-03 (×4): qty 1

## 2020-02-03 NOTE — Progress Notes (Signed)
Patient ID: Adrian Gillespie, male   DOB: November 28, 1950, 70 y.o.   MRN: [id number] [facility] PROGRESS NOTE  Adrian Gillespie JOI:[id number] DOB: 1950/09/04 DOA: 02/02/2020 PCP: Burnett Kanaris, MD  HPI/Subjective: Patient complains of 10 out of 10 pain.  He states that he lost his balance and [employer].  He stated there was no sign of wet floor and he slipped in water.  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  Seen by orthopedic surgery and recommended medical management with pain control and physical therapy.  Objective: Vitals:   02/03/20 0759 02/03/20 1227  BP: 107/87 111/71  Pulse: 87 98  Resp: 15 16  Temp: 97.7 F (36.5 C) 98.7 F (37.1 C)  SpO2: 93% 92%    Intake/Output Summary (Last 24 hours) at 02/03/2020 1427 Last data filed at 02/03/2020 1035 Gross per 24 hour  Intake 120 ml  Output 1225 ml  Net -1105 ml   Filed Weights   02/01/20 2135 02/02/20 0753  Weight: 75.4 kg 75.4 kg    ROS: Review of Systems  Respiratory: Negative for cough and shortness of breath.   Cardiovascular: Negative for chest pain.  Gastrointestinal: Negative for abdominal pain, nausea and vomiting.  Musculoskeletal: Positive for joint pain.   Exam: Physical Exam HENT:     Head: Normocephalic.     Mouth/Throat:     Pharynx: No oropharyngeal exudate.  Eyes:     General: Lids are normal.     Conjunctiva/sclera: Conjunctivae normal.     Pupils: Pupils are equal, round, and reactive to light.  Cardiovascular:     Rate and Rhythm: Normal rate and regular rhythm.     Heart sounds: Normal heart sounds, S1 normal and S2 normal.  Pulmonary:     Breath sounds: No decreased breath sounds, wheezing, rhonchi or rales.  Abdominal:     Palpations: Abdomen is soft.     Tenderness: There is no abdominal tenderness.  Musculoskeletal:     Right ankle: No swelling.     Left ankle: No swelling.  Skin:    General: Skin is warm.      Findings: No rash.  Neurological:     Mental Status: He is alert and oriented to person, place, and time.       Data Reviewed: Basic Metabolic Panel: Recent Labs  Lab 02/02/20 0219  NA 135  K 3.9  CL 104  CO2 20*  GLUCOSE 138*  BUN 7*  CREATININE 1.03  CALCIUM 8.7*   Liver Function Tests: Recent Labs  Lab 02/02/20 0219  AST 20  ALT 13  ALKPHOS 62  BILITOT 1.0  PROT 6.2*  ALBUMIN 3.6   CBC: Recent Labs  Lab 02/02/20 0219  WBC 12.9*  NEUTROABS 9.3*  HGB 14.3  HCT 42.4  MCV 82.7  PLT 215  BNP (last 3 results) Recent Labs    04/02/19 1157  BNP 132.0*     Recent Results (from the past 240 hour(s))  Resp Panel by RT-PCR (Flu A&B, Covid) Nasopharyngeal Swab     Status: None   Collection Time: 02/02/20 11:27 AM   Specimen: Nasopharyngeal Swab; Nasopharyngeal(NP) swabs in vial transport medium  Result Value Ref Range Status   SARS Coronavirus 2 by RT PCR NEGATIVE NEGATIVE Final    Comment: (NOTE) SARS-CoV-2 target nucleic acids are NOT DETECTED.  The SARS-CoV-2 RNA is generally detectable in upper respiratory specimens during the acute phase of infection. The  lowest concentration of SARS-CoV-2 viral copies this assay can detect is 138 copies/mL. A negative result does not preclude SARS-Cov-2 infection and should not be used as the sole basis for treatment or other patient management decisions. A negative result may occur with  improper specimen collection/handling, submission of specimen other than nasopharyngeal swab, presence of viral mutation(s) within the areas targeted by this assay, and inadequate number of viral copies(<138 copies/mL). A negative result must be combined with clinical observations, patient history, and epidemiological information. The expected result is Negative.  Fact Sheet for Patients:  BloggerCourse.com  Fact Sheet for Healthcare Providers:  SeriousBroker.it  This test is  no t yet approved or cleared by the [place] FDA and  has been authorized for detection and/or diagnosis of SARS-CoV-2 by FDA under an Emergency Use Authorization (EUA). This EUA will remain  in effect (meaning this test can be used) for the duration of the COVID-19 declaration under Section 564(b)(1) of the Act, 21 U.S.C.section 360bbb-3(b)(1), unless the authorization is terminated  or revoked sooner.       Influenza A by PCR NEGATIVE NEGATIVE Final   Influenza B by PCR NEGATIVE NEGATIVE Final    Comment: (NOTE) The Xpert Xpress SARS-CoV-2/FLU/RSV plus assay is intended as an aid in the diagnosis of influenza from Nasopharyngeal swab specimens and should not be used as a sole basis for treatment. Nasal washings and aspirates are unacceptable for Xpert Xpress SARS-CoV-2/FLU/RSV testing.  Fact Sheet for Patients: BloggerCourse.com  Fact Sheet for Healthcare Providers: SeriousBroker.it  This test is not yet approved or cleared by the [place] FDA and has been authorized for detection and/or diagnosis of SARS-CoV-2 by FDA under an Emergency Use Authorization (EUA). This EUA will remain in effect (meaning this test can be used) for the duration of the COVID-19 declaration under Section 564(b)(1) of the Act, 21 U.S.C. section 360bbb-3(b)(1), unless the authorization is terminated or revoked.  Performed at [facility], [address] [place], [place] [address]      Studies: DG Chest [place] 1 View  Result Date: 02/02/2020 CLINICAL DATA:  Left hip fracture, preoperative examination EXAM: PORTABLE CHEST 1 VIEW COMPARISON:  05/23/2019 FINDINGS: The heart size and mediastinal contours are within normal limits. Both lungs are clear. The visualized skeletal structures are unremarkable. IMPRESSION: No active disease. Electronically Signed   By: Helyn Numbers MD   On: 02/02/2020 02:09   DG Hip Unilat W or Wo Pelvis 2-3  Views Left  Result Date: 02/01/2020 CLINICAL DATA:  Status post fall. EXAM: DG HIP (WITH OR WITHOUT PELVIS) 2-3V LEFT COMPARISON:  None. FINDINGS: A total left hip replacement is seen. There is no evidence of surrounding lucency to suggest the presence of hardware loosening or infection. Acute, mildly displaced fracture deformity is seen involving the lateral aspect of the proximal left femoral shaft. This is adjacent to the distal aspect of the left greater trochanter. There is no evidence of dislocation. Mild soft tissue swelling is seen along the lateral aspect of the left hip. IMPRESSION: 1. Acute fracture of the proximal left femur. 2. Left hip replacement without evidence of hardware loosening or infection. Electronically Signed   By: Aram Candela M.D.   On: 02/01/2020 21:33    Scheduled Meds: . apixaban  5 mg Oral BID  . benztropine  1 mg Oral Daily  . haloperidol  10 mg Oral BID  . mometasone-formoterol  2 puff Inhalation BID  . montelukast  10 mg Oral Daily  .  pantoprazole  40 mg Oral Daily  . sodium chloride flush  3 mL Intravenous Q12H  . tiotropium  18 mcg Inhalation Daily  . traZODone  300 mg Oral QHS   Continuous Infusions: . sodium chloride      Assessment/Plan:  1. Left proximal closed femur fracture with history of left hip replacement.  Seen in consultation by Dr. Joice Lofts and recommends nonoperative management and pain control.  Since the patient still having quite a bit of pain will have to increase oxycodone to 10 mg every 6 hours as needed and add IV morphine as needed for severe pain.  Continue working with physical therapy.  Likely will need rehab. 2. Paroxysmal atrial fibrillation on Eliquis for anticoagulation. 3. Schizophrenia on benztropine, haloperidol 4. COPD on Singulair Spiriva and Dulera inhaler     Code Status:     Code Status Orders  (From admission, onward)         Start     Ordered   02/02/20 1301  Full code  Continuous        02/02/20 1300         Code Status History    Date Active Date Inactive Code Status Order ID Comments User Context   05/24/2019 0155 05/26/2019 2134 Full Code [id number]  Arville Care Vernetta Honey, MD ED   04/20/2019 1027 04/23/2019 2152 Full Code [id number]  Lorretta Harp, MD ED   04/02/2019 1511 04/05/2019 2202 Full Code [id number]  Lucile Shutters, MD ED   Advance Care Planning Activity     Family Communication: Spoke with brother on the phone Disposition Plan: Status is: Inpatient  Dispo: The patient is from: Home              Anticipated d/c is to: Rehab              Anticipated d/c date is: Potential out to rehab in a day or 2 depending on rehab facility and pain control.              Patient currently trying to control pain with increasing oral pain medications and as needed IV pain medication.  Time spent: 27 minutes  [employer]

## 2020-02-03 NOTE — Evaluation (Signed)
Occupational Therapy Evaluation Patient Details Name: Adrian Gillespie MRN: [id number] DOB: 1950/11/07 Today's Date: 02/03/2020    History of Present Illness Pt is a 70 y.o. male with a history of COPD, anxiety/depression, hyperlipidemia, hypertension, and schizophrenia who was in his usual state of health yesterday evening.  While at a grocery store, he slipped and fell on a wet floor, landing on his left hip.  He was brought to the emergency room where x-rays demonstrated a an essentially nondisplaced fracture of the left greater trochanter.  The patient's past medical history is notable for having undergone a left total hip arthroplasty at [facility] approximately 6 years ago for a left femoral neck fracture.   Clinical Impression   Pt seen for OT evaluation this date in setting of pain following fall and found to have nondisplaced L hip fracture. Pt reports being INDEP at baseline with no use of AD and runs his own errands. Pt currently requires MIN/MOD A for in-bed rolling and declines to sit up/stand up with OT This date citing pain and fatigue from prior PT session. Pt also requiring MAX A For bed level LB ADLs including minimally able to bridge to participate in bed-level removal of LB clothing-pants and underwear; and SETUP to MIN A For bed level UB ADLs such as bathing and applying deodorant. Will continue to follow acutely. Currently anticipate that pt will require SNF follow up to improve safety, stability and sitting/standing tolerance required to safely resume ADLs/ADL mobility.     Follow Up Recommendations  SNF    Equipment Recommendations  3 in 1 bedside commode;Tub/shower seat;Other (comment) (2ww)    Recommendations for Other Services       Precautions / Restrictions Precautions Precautions: Fall Restrictions Weight Bearing Restrictions: Yes LLE Weight Bearing: Touchdown weight bearing Other Position/Activity Restrictions: per ortho consult: "He may be mobilized with physical  therapy toe-touch to partial weightbearing on the left leg with a walker as symptoms permit."      Mobility Bed Mobility Overal bed mobility: Needs Assistance Bed Mobility: Rolling Rolling: Min assist;Mod assist   Supine to sit: HOB elevated;Mod assist Sit to supine: Mod assist   General bed mobility comments: pt declines to sit EOB With OT this session citing pain and fatigue. Pt requires MOD/MAX A with use of draw sheet and pt able to assist with R LE to propel towards [facility] To optimize positionin.    Transfers Overall transfer level: Needs assistance Equipment used: Rolling walker (2 wheeled) Transfers: Sit to/from Stand Sit to Stand: Mod assist;From elevated surface         General transfer comment: deferred    Balance Overall balance assessment: Needs assistance Sitting-balance support: Feet supported Sitting balance-Leahy Scale: Fair Sitting balance - Comments: deferred     Standing balance-Leahy Scale: Poor Standing balance comment: deferred                           ADL either performed or assessed with clinical judgement   ADL                                         General ADL Comments: Pt requires MAX A For bed level LB ADLs, SETUP to MIN A For bed level UB ADLs and is unable to tolerate transfer with OT this date citing fatigue from PT.     Vision Baseline  Vision/History: Wears glasses Wears Glasses: At all times Patient Visual Report: No change from baseline       Perception     Praxis      Pertinent Vitals/Pain Pain Assessment: Faces Pain Score: 9  Faces Pain Scale: Hurts whole lot Pain Location: L hip with any attempts to mobilize pt Pain Descriptors / Indicators: Guarding;Grimacing;Moaning;Crying;Constant;Throbbing Pain Intervention(s): Limited activity within patient's tolerance;Monitored during session;Repositioned;Ice applied     Hand Dominance     Extremity/Trunk Assessment Upper Extremity  Assessment Upper Extremity Assessment: Overall WFL for tasks assessed;Generalized weakness (ROM WFL, MMT Grossly 4-/5)   Lower Extremity Assessment Lower Extremity Assessment: Defer to PT evaluation;RLE deficits/detail;LLE deficits/detail RLE Deficits / Details: WFLs LLE Deficits / Details: unable to lift against gravity without assistance   Cervical / Trunk Assessment Cervical / Trunk Assessment: Normal   Communication Communication Communication: No difficulties   Cognition Arousal/Alertness: Awake/alert Behavior During Therapy: WFL for tasks assessed/performed Overall Cognitive Status: Within Functional Limits for tasks assessed                                     General Comments       Exercises Other Exercises Other Exercises: OT facilitates ed re: role of OT in acute setting, importance of OOB activity, safety considerations given current hip pain. Pt with good understanding and receptive to starting a HEP with OT to strengthen UB to ensure stability with use of RW for fxl mobility.   Shoulder Instructions      Home Living Family/patient expects to be discharged to:: Private residence Living Arrangements: Non-relatives/Friends (roommate) Available Help at Discharge: Friend(s);Available PRN/intermittently Type of Home: Apartment Home Access: Elevator (3rd floor apt)     Home Layout: One level               Home Equipment: None          Prior Functioning/Environment Level of Independence: Independent        Comments: drives, cooks, cleans. no other falls        OT Problem List: Decreased strength;Decreased range of motion;Decreased activity tolerance;Impaired balance (sitting and/or standing);Decreased safety awareness;Decreased knowledge of use of DME or AE;Pain      OT Treatment/Interventions: Self-care/ADL training;DME and/or AE instruction;Therapeutic activities;Balance training;Therapeutic exercise;Energy  conservation;Patient/family education    OT Goals(Current goals can be found in the care plan section) Acute Rehab OT Goals Patient Stated Goal: to decrease pain OT Goal Formulation: With patient Time For Goal Achievement: 02/17/20 Potential to Achieve Goals: Good ADL Goals Pt Will Perform Lower Body Dressing: with min guard assist;with adaptive equipment;sit to/from stand Pt Will Transfer to Toilet: with min assist;stand pivot transfer;bedside commode Pt/caregiver will Perform Home Exercise Program: Increased strength;Both right and left upper extremity;With Supervision (to increase ability to bear weight through UEs on RW to alleviate weight to L hip)  OT Frequency: Min 1X/week   Barriers to D/C:            Co-evaluation              AM-PAC OT "6 Clicks" Daily Activity     Outcome Measure Help from another person eating meals?: None Help from another person taking care of personal grooming?: A Little Help from another person toileting, which includes using toliet, bedpan, or urinal?: A Lot Help from another person bathing (including washing, rinsing, drying)?: A Lot Help from another person to put on and  taking off regular upper body clothing?: A Little Help from another person to put on and taking off regular lower body clothing?: A Lot 6 Click Score: 16   End of Session    Activity Tolerance: Patient limited by fatigue;Patient limited by pain Patient left: in bed;with call bell/phone within reach;with bed alarm set  OT Visit Diagnosis: Unsteadiness on feet (R26.81);Other abnormalities of gait and mobility (R26.89);Muscle weakness (generalized) (M62.81);History of falling (Z91.81)                Time: 8177-1165 OT Time Calculation (min): 38 min Charges:  OT General Charges $OT Visit: 1 Visit OT Evaluation $OT Eval Moderate Complexity: 1 Mod OT Treatments $Self Care/Home Management : 8-22 mins $Therapeutic Activity: 8-22 mins  Gerrianne Scale, MS, OTR/L ascom  [phone] 02/03/20, 2:49 PM

## 2020-02-03 NOTE — Evaluation (Addendum)
Physical Therapy Evaluation Patient Details Name: Adrian Gillespie MRN: [id number] DOB: 08-Oct-1950 Today's Date: 02/03/2020   History of Present Illness  Pt is a 70 y.o. male with a history of COPD, anxiety/depression, hyperlipidemia, hypertension, and schizophrenia who was in his usual state of health yesterday evening.  While at a grocery store, he slipped and fell on a wet floor, landing on his left hip.  He was brought to the emergency room where x-rays demonstrated a an essentially nondisplaced fracture of the left greater trochanter.  The patient's past medical history is notable for having undergone a left total hip arthroplasty at [facility] approximately 6 years ago for a left femoral neck fracture.    Clinical Impression  Patient reported 9/10 pain in L hip, premedicated prior to PT evaluation. Pt stated he lives at home with a roommate, prior to fall that led to admission he was independent at baseline in a third floor apartment.   The patient was able to ankle pumps bilaterally, unable to LLE against gravity due to pain. Supine to sit with modA, significant time needed and eventually achieved fair sitting balance with lean to R to offweight LLE. Sit <> stand attempted twice from elevated surface. Second attempt pt able to come up into full standing with RW and modA. Further mobility attempts held due to pain. In standing pt predominantly NWB to LLE; unable to hop on RLE. did shuffle his foot to the R at EOB 13ft. Returned to supine modA and repositioned for comfort.  Overall the patient demonstrated deficits (see "PT Problem List") that impede the patient's functional abilities, safety, and mobility and would benefit from skilled PT intervention. Recommendation is SNF due to current level of assistance needed and decline in functional mobility from PLOF.    Follow Up Recommendations SNF    Equipment Recommendations  Rolling walker with 5" wheels;3in1 (PT)    Recommendations for Other Services        Precautions / Restrictions Precautions Precautions: Fall Restrictions Weight Bearing Restrictions: Yes LLE Weight Bearing: Touchdown weight bearing Other Position/Activity Restrictions: per ortho consult: "He may be mobilized with physical therapy toe-touch to partial weightbearing on the left leg with a walker as symptoms permit."      Mobility  Bed Mobility Overal bed mobility: Needs Assistance Bed Mobility: Supine to Sit;Sit to Supine     Supine to sit: HOB elevated;Mod assist Sit to supine: Mod assist        Transfers Overall transfer level: Needs assistance Equipment used: Rolling walker (2 wheeled) Transfers: Sit to/from Stand Sit to Stand: Mod assist;From elevated surface            Ambulation/Gait             General Gait Details: pt unable. pt predominantly NWB to LLE; unable to hop on RLE. did shuffle his foot to the R at EOB 32ft  Stairs            Wheelchair Mobility    Modified Rankin (Stroke Patients Only)       Balance Overall balance assessment: Needs assistance Sitting-balance support: Feet supported Sitting balance-Leahy Scale: Fair Sitting balance - Comments: reliant on UE support     Standing balance-Leahy Scale: Poor Standing balance comment: reliant on RW for support throughout standing and modA from PT                             Pertinent Vitals/Pain Pain Assessment: 0-10 Pain  Score: 9  Pain Location: L hip Pain Descriptors / Indicators: Guarding;Grimacing;Moaning;Crying;Constant;Throbbing Pain Intervention(s): Limited activity within patient's tolerance;Monitored during session;Premedicated before session;Repositioned    Home Living Family/patient expects to be discharged to:: Private residence Living Arrangements:  (roommate) Available Help at Discharge: Friend(s);Available PRN/intermittently Type of Home: Apartment Home Access: Elevator (3rd floor apartment)     Home Layout: One level Home  Equipment: None      Prior Function Level of Independence: Independent         Comments: drives, cooks, cleans. no other falls     Hand Dominance        Extremity/Trunk Assessment   Upper Extremity Assessment Upper Extremity Assessment: Overall WFL for tasks assessed;Defer to OT evaluation    Lower Extremity Assessment Lower Extremity Assessment: LLE deficits/detail;RLE deficits/detail RLE Deficits / Details: WFLs LLE Deficits / Details: unable to lift against gravity without assistance    Cervical / Trunk Assessment Cervical / Trunk Assessment: Normal  Communication   Communication: No difficulties  Cognition Arousal/Alertness: Awake/alert Behavior During Therapy: WFL for tasks assessed/performed Overall Cognitive Status: Within Functional Limits for tasks assessed                                        General Comments      Exercises Other Exercises Other Exercises: Pt able to ankle pump bilaterally, heel slide on LLE with assistance and SLR with assistance   Assessment/Plan    PT Assessment Patient needs continued PT services  PT Problem List Decreased strength;Decreased mobility;Decreased range of motion;Decreased activity tolerance;Decreased balance;Decreased knowledge of use of DME;Pain;Decreased knowledge of precautions       PT Treatment Interventions DME instruction;Therapeutic exercise;Gait training;Balance training;Stair training;Neuromuscular re-education;Functional mobility training;Therapeutic activities;Patient/family education    PT Goals (Current goals can be found in the Care Plan section)  Acute Rehab PT Goals Patient Stated Goal: to decrease pain PT Goal Formulation: With patient Time For Goal Achievement: 02/17/20 Potential to Achieve Goals: Good    Frequency 7X/week   Barriers to discharge Inaccessible home environment;Decreased caregiver support      Co-evaluation               AM-PAC PT "6 Clicks"  Mobility  Outcome Measure Help needed turning from your back to your side while in a flat bed without using bedrails?: A Lot Help needed moving from lying on your back to sitting on the side of a flat bed without using bedrails?: A Lot Help needed moving to and from a bed to a chair (including a wheelchair)?: Total Help needed standing up from a chair using your arms (e.g., wheelchair or bedside chair)?: Total Help needed to walk in hospital room?: Total Help needed climbing 3-5 steps with a railing? : Total 6 Click Score: 8    End of Session Equipment Utilized During Treatment: Gait belt Activity Tolerance: Patient limited by pain Patient left: in bed;with call bell/phone within reach;with bed alarm set Nurse Communication: Mobility status PT Visit Diagnosis: Other abnormalities of gait and mobility (R26.89);Muscle weakness (generalized) (M62.81);Difficulty in walking, not elsewhere classified (R26.2);Pain Pain - Right/Left: Left Pain - part of body: Hip    Time: 1030-1055 PT Time Calculation (min) (ACUTE ONLY): 25 min   Charges:   PT Evaluation $PT Eval Low Complexity: 1 Low PT Treatments $Therapeutic Exercise: 8-22 mins        Olga Coaster PT, DPT 12:09 PM,02/03/20

## 2020-02-04 DIAGNOSIS — S7292XA Unspecified fracture of left femur, initial encounter for closed fracture: Secondary | ICD-10-CM | POA: Diagnosis not present

## 2020-02-04 DIAGNOSIS — J449 Chronic obstructive pulmonary disease, unspecified: Secondary | ICD-10-CM | POA: Diagnosis not present

## 2020-02-04 DIAGNOSIS — D6869 Other thrombophilia: Secondary | ICD-10-CM | POA: Diagnosis not present

## 2020-02-04 DIAGNOSIS — I480 Paroxysmal atrial fibrillation: Secondary | ICD-10-CM | POA: Diagnosis not present

## 2020-02-04 NOTE — Progress Notes (Signed)
Patient ID: Adrian Gillespie, male   DOB: [birth year], 70 y.o.   MRN: [id number] [facility] PROGRESS NOTE  Adrian Gillespie GQQ:[id number] DOB: 1950-06-06 DOA: 02/02/2020 PCP: Andres Shad, MD  HPI/Subjective:   Objective: Vitals:   02/04/20 1126 02/04/20 1522  BP: 122/76 (!) 115/91  Pulse: 86 87  Resp: 18 17  Temp: 98.6 F (37 C) 97.9 F (36.6 C)  SpO2: 93% 91%    Intake/Output Summary (Last 24 hours) at 02/04/2020 1634 Last data filed at 02/04/2020 0839 Gross per 24 hour  Intake --  Output 1050 ml  Net -1050 ml   Filed Weights   02/01/20 2135 02/02/20 0753  Weight: 75.4 kg 75.4 kg    ROS: Review of Systems  Respiratory: Negative for shortness of breath.   Cardiovascular: Negative for chest pain.  Gastrointestinal: Negative for abdominal pain.   Exam: Physical Exam HENT:     Head: Normocephalic.     Mouth/Throat:     Pharynx: No oropharyngeal exudate.  Eyes:     General: Lids are normal.     Conjunctiva/sclera: Conjunctivae normal.  Cardiovascular:     Rate and Rhythm: Normal rate and regular rhythm.     Heart sounds: Normal heart sounds, S1 normal and S2 normal.  Pulmonary:     Breath sounds: No decreased breath sounds, wheezing, rhonchi or rales.  Abdominal:     Palpations: Abdomen is soft.     Tenderness: There is no abdominal tenderness.  Musculoskeletal:     Right lower leg: No swelling.     Left lower leg: No swelling.  Skin:    General: Skin is warm.     Findings: No rash.  Neurological:     Mental Status: He is alert.     Comments: Unable to straight leg raise with his left leg       Data Reviewed: Basic Metabolic Panel: Recent Labs  Lab 02/02/20 0219  NA 135  K 3.9  CL 104  CO2 20*  GLUCOSE 138*  BUN 7*  CREATININE 1.03  CALCIUM 8.7*   Liver Function Tests: Recent Labs  Lab 02/02/20 0219  AST 20  ALT 13  ALKPHOS 62  BILITOT 1.0  PROT 6.2*  ALBUMIN 3.6   CBC: Recent Labs  Lab 02/02/20 0219  WBC 12.9*  NEUTROABS  9.3*  HGB 14.3  HCT 42.4  MCV 82.7  PLT 215  BNP (last 3 results) Recent Labs    04/02/19 1157  BNP 132.0*     Recent Results (from the past 240 hour(s))  Resp Panel by RT-PCR (Flu A&B, Covid) Nasopharyngeal Swab     Status: None   Collection Time: 02/02/20 11:27 AM   Specimen: Nasopharyngeal Swab; Nasopharyngeal(NP) swabs in vial transport medium  Result Value Ref Range Status   SARS Coronavirus 2 by RT PCR NEGATIVE NEGATIVE Final    Comment: (NOTE) SARS-CoV-2 target nucleic acids are NOT DETECTED.  The SARS-CoV-2 RNA is generally detectable in upper respiratory specimens during the acute phase of infection. The lowest concentration of SARS-CoV-2 viral copies this assay can detect is 138 copies/mL. A negative result does not preclude SARS-Cov-2 infection and should not be used as the sole basis for treatment or other patient management decisions. A negative result may occur with  improper specimen collection/handling, submission of specimen other than nasopharyngeal swab, presence of viral mutation(s) within the areas targeted by this assay, and inadequate number of viral copies(<138 copies/mL). A negative result must be combined with clinical observations, patient history,  and epidemiological information. The expected result is Negative.  Fact Sheet for Patients:  BloggerCourse.com  Fact Sheet for Healthcare Providers:  SeriousBroker.it  This test is no t yet approved or cleared by the [place] FDA and  has been authorized for detection and/or diagnosis of SARS-CoV-2 by FDA under an Emergency Use Authorization (EUA). This EUA will remain  in effect (meaning this test can be used) for the duration of the COVID-19 declaration under Section 564(b)(1) of the Act, 21 U.S.C.section 360bbb-3(b)(1), unless the authorization is terminated  or revoked sooner.       Influenza A by PCR NEGATIVE NEGATIVE Final   Influenza  B by PCR NEGATIVE NEGATIVE Final    Comment: (NOTE) The Xpert Xpress SARS-CoV-2/FLU/RSV plus assay is intended as an aid in the diagnosis of influenza from Nasopharyngeal swab specimens and should not be used as a sole basis for treatment. Nasal washings and aspirates are unacceptable for Xpert Xpress SARS-CoV-2/FLU/RSV testing.  Fact Sheet for Patients: BloggerCourse.com  Fact Sheet for Healthcare Providers: SeriousBroker.it  This test is not yet approved or cleared by the [place] FDA and has been authorized for detection and/or diagnosis of SARS-CoV-2 by FDA under an Emergency Use Authorization (EUA). This EUA will remain in effect (meaning this test can be used) for the duration of the COVID-19 declaration under Section 564(b)(1) of the Act, 21 U.S.C. section 360bbb-3(b)(1), unless the authorization is terminated or revoked.  Performed at [facility], [address] [place], [place] [address]       Scheduled Meds: . apixaban  5 mg Oral BID  . benztropine  1 mg Oral Daily  . haloperidol  10 mg Oral BID  . mometasone-formoterol  2 puff Inhalation BID  . montelukast  10 mg Oral Daily  . pantoprazole  40 mg Oral Daily  . sodium chloride flush  3 mL Intravenous Q12H  . tiotropium  18 mcg Inhalation Daily  . traZODone  300 mg Oral QHS   Continuous Infusions: . sodium chloride      Assessment/Plan:  1. Left proximal closed femur fracture with history of left hip replacement.  Case discussed with Dr. Rosita Kea covering over the weekend.  Nonoperative management with pain control.  The patient still states that he has 10 out of 10 pain but looks comfortable lying in the bed.  I asked him if the pain medication helps and he said yes.  Physical therapy recommending rehab. 2. Paroxysmal atrial fibrillation on Eliquis for anticoagulation 3. Acquired thrombophilia.  Atrial fibrillation increases risk of stroke.  Eliquis  will decrease the risk of stroke. 4. Schizophrenia on benztropine and haloperidol 5. COPD on Singulair, Spiriva and Dulera inhaler.        Code Status:     Code Status Orders  (From admission, onward)         Start     Ordered   02/02/20 1301  Full code  Continuous        02/02/20 1300        Code Status History    Date Active Date Inactive Code Status Order ID Comments User Context   05/24/2019 0155 05/26/2019 2134 Full Code [id number]  Arville Care Vernetta Honey, MD ED   04/20/2019 1027 04/23/2019 2152 Full Code [id number]  Lorretta Harp, MD ED   04/02/2019 1511 04/05/2019 2202 Full Code [id number]  Lucile Shutters, MD ED   Advance Care Planning Activity     Family Communication: Spoke with brother on the phone Disposition Plan:  Status is: Inpatient  Dispo: The patient is from: Home              Anticipated d/c is to: Rehab              Anticipated d/c date is: Likely 02/06/2020              Patient currently still in quite a bit of pain in left hip.  Pain control with oral and IV meds.  Time spent: 27 minutes  Leniya Breit [employer]

## 2020-02-04 NOTE — TOC Initial Note (Signed)
Transition of Care [facility]) - Initial/Assessment Note    Patient Details  Name: Adrian Gillespie MRN: [id number] Date of Birth: 1950-10-24  Transition of [facility]) CM/SW Contact:    Verna Czech [place], [place] Phone Number: [phone] 02/04/2020, 8:26 PM  Clinical Narrative:                 Patient is a 70 year old male who presented after a fall in the grocery store resulting in a left hip fracture.  Patient states that he lives in Independent Living through [facility]. Patient names his brother RG Millhouse as his main support. Patient agreeable to SNF. SNF process discussed. Patient has no preference in regards to facility.  FL2 completed, PASRR in level II review-needs of additional clinicals.    Expected Discharge Plan: Skilled Nursing Facility Barriers to Discharge: No Barriers Identified   Patient Goals and CMS Choice Patient states their goals for this hospitalization and ongoing recovery are:: "I want to get stronger"   Choice offered to / list presented to : Patient  Expected Discharge Plan and Services Expected Discharge Plan: Skilled Nursing Facility In-house Referral: Clinical Social Work   Post Acute Care Choice: Skilled Nursing Facility Living arrangements for the past 2 months: Independent Living Facility                                      Prior Living Arrangements/Services Living arrangements for the past 2 months: Independent Living Facility Lives with:: Self Patient language and need for interpreter reviewed:: Yes Do you feel safe going back to the place where you live?: Yes      Need for Family Participation in Patient Care: Yes (Comment) Care giver support system in place?: Yes (comment) Current home services: DME,Other (comment) (O2) Criminal Activity/Legal Involvement Pertinent to Current Situation/Hospitalization: No - Comment as needed  Activities of Daily Living Home Assistive Devices/Equipment: None ADL Screening (condition at time of  admission) Patient's cognitive ability adequate to safely complete daily activities?: Yes Is the patient deaf or have difficulty hearing?: No Does the patient have difficulty seeing, even when wearing glasses/contacts?: No Does the patient have difficulty concentrating, remembering, or making decisions?: No Patient able to express need for assistance with ADLs?: Yes Does the patient have difficulty dressing or bathing?: No Independently performs ADLs?: Yes (appropriate for developmental age) Does the patient have difficulty walking or climbing stairs?: Yes Weakness of Legs: None Weakness of Arms/Hands: None  Permission Sought/Granted   Permission granted to share information with : Yes, Verbal Permission Granted  Share Information with NAME: RG Mirsky     Permission granted to share info w Relationship: Brother  Permission granted to share info w Contact Information: [phone]  Emotional Assessment Appearance:: Appears older than stated age Attitude/Demeanor/Rapport: Engaged Affect (typically observed): Accepting Orientation: : Oriented to Self,Oriented to Place,Oriented to  Time,Oriented to Situation Alcohol / Substance Use: Not Applicable Psych Involvement: No (comment)  Admission diagnosis:  Femur fracture, left (HCC) [S72.92XA] Intractable pain [R52] Closed nondisplaced fracture of greater trochanter of left femur, initial encounter [facility]) [S72.115A] Patient Active Problem List   Diagnosis Date Noted  . Acquired thrombophilia (HCC)   . Intractable pain 02/02/2020  . 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 Femur fracture, left (HCC) 02/02/2020  . AF (paroxysmal atrial fibrillation) (HCC) 02/02/2020  . Malnutrition of moderate degree 05/26/2019  . Acute respiratory failure with hypoxemia (HCC) 05/26/2019  . CAP (community  acquired pneumonia) 04/20/2019  . PVC (premature ventricular contraction) 04/20/2019  . GERD (gastroesophageal reflux disease) 04/20/2019  . Acute on  chronic respiratory failure with hypoxia (HCC)   . HTN (hypertension)   . Chronic obstructive pulmonary disease (HCC)   . NSVT (nonsustained ventricular tachycardia) (HCC)   . Acute respiratory failure with hypoxia (HCC) 04/02/2019  . Hypokalemia 04/02/2019  . Schizophrenia (HCC) 04/02/2019   PCP:  Andres Shad, MD Pharmacy:   [facility] [address] [place], [place] - [address] [place] [address] Phone: [phone] Fax: [phone]     Social Determinants of Health (SDOH) Interventions    Readmission Risk Interventions Readmission Risk Prevention Plan 05/25/2019  Transportation Screening Complete  PCP or Specialist Appt within 3-5 Days Complete  HRI or Home Care Consult Complete  Social Work Consult for Recovery Care Planning/Counseling Complete  Palliative Care Screening Not Applicable  Medication Review Oceanographer) Complete

## 2020-02-04 NOTE — Progress Notes (Signed)
Physical Therapy Treatment Patient Details Name: Adrian Gillespie MRN: [id number] DOB: 1950-05-01 Today's Date: 02/04/2020    History of Present Illness Pt is a 70 y.o. male with a history of COPD, anxiety/depression, hyperlipidemia, hypertension, and schizophrenia who was in his usual state of health yesterday evening.  While at a grocery store, he slipped and fell on a wet floor, landing on his left hip.  He was brought to the emergency room where x-rays demonstrated a an essentially nondisplaced fracture of the left greater trochanter.  The patient's past medical history is notable for having undergone a left total hip arthroplasty at [facility] approximately 6 years ago for a left femoral neck fracture.    PT Comments    Pt only able to tolerate sitting EOB this session. He presents with R lateral lean and reluctance to bear weight through L hip in sitting. Pt reporting pain with all functional movements with only reported decrease of pain with passive hip IR/ER rhythmic rocking. The pt remains appropriate for current level of care. PT will continue to follow.    Follow Up Recommendations  SNF     Equipment Recommendations  Rolling walker with 5" wheels;3in1 (PT)    Recommendations for Other Services       Precautions / Restrictions Precautions Precautions: Fall Restrictions Weight Bearing Restrictions: Yes LLE Weight Bearing: Touchdown weight bearing Other Position/Activity Restrictions: per ortho consult: "He may be mobilized with physical therapy toe-touch to partial weightbearing on the left leg with a walker as symptoms permit."    Mobility  Bed Mobility Overal bed mobility: Needs Assistance Bed Mobility: Rolling Rolling: Modified independent (Device/Increase time) (Pt able to independently complete bilateral rolling when attempting to organize draw sheet following mobility.)   Supine to sit: Min assist (for LLE management.) Sit to supine: Mod assist      Transfers                  General transfer comment: Transfers deferred; pt only tolerating sitting at EOB x3 min.  Ambulation/Gait                 Stairs             Wheelchair Mobility    Modified Rankin (Stroke Patients Only)       Balance       Sitting balance - Comments: Pt with increased pain when sitting upright with pressure through L hip. Postural control: Right lateral lean (Pt with RLE d/t L hip pain)                                  Cognition Arousal/Alertness: Suspect due to medications (pt stating he hasn't had medications, however level of arousal flucuates throughout session.)   Overall Cognitive Status: Difficult to assess                                        Exercises General Exercises - Lower Extremity Heel Slides:  (Attempted with pt refusing PROM d/t pain.) Hip ABduction/ADduction: PROM;Left;15 reps Other Exercises Other Exercises: Hip Internal/External Rotation; PROM, done to assist with calming muscles surrounding hip joint. Visual muscles spasms relax with this technique.    General Comments        Pertinent Vitals/Pain Pain Assessment: Faces Faces Pain Scale: Hurts whole lot Pain Location: L hip Pain Descriptors /  Indicators: Grimacing;Guarding;Constant;Discomfort;Moaning;Spasm Pain Intervention(s): Patient requesting pain meds-RN notified;Monitored during session;Limited activity within patient's tolerance (Ice/heat offered to the patient; he declined both.)    Home Living                      Prior Function            PT Goals (current goals can now be found in the care plan section) Acute Rehab PT Goals Patient Stated Goal: to decrease pain PT Goal Formulation: With patient Time For Goal Achievement: 02/17/20 Potential to Achieve Goals: Good Progress towards PT goals: Progressing toward goals    Frequency    7X/week      PT Plan Current plan remains appropriate     Co-evaluation              AM-PAC PT "6 Clicks" Mobility   Outcome Measure  Help needed turning from your back to your side while in a flat bed without using bedrails?: A Little Help needed moving from lying on your back to sitting on the side of a flat bed without using bedrails?: A Little Help needed moving to and from a bed to a chair (including a wheelchair)?: Total Help needed standing up from a chair using your arms (e.g., wheelchair or bedside chair)?: Total Help needed to walk in hospital room?: Total Help needed climbing 3-5 steps with a railing? : Total 6 Click Score: 10    End of Session   Activity Tolerance: Patient limited by pain Patient left: in bed;with call bell/phone within reach Nurse Communication: Mobility status;Patient requests pain meds;Precautions;Weight bearing status PT Visit Diagnosis: Other abnormalities of gait and mobility (R26.89);Muscle weakness (generalized) (M62.81);Difficulty in walking, not elsewhere classified (R26.2);Pain Pain - Right/Left: Left Pain - part of body: Hip     Time: 3570-1779 PT Time Calculation (min) (ACUTE ONLY): 24 min  Charges:  $Therapeutic Activity: 23-37 mins                     2:41 PM, 02/04/20 Tabathia Knoche A. Mordecai Maes PT, DPT Physical Therapist - [facility] A Savreen Gebhardt 02/04/2020, 2:38 PM

## 2020-02-04 NOTE — [place] FL2 (Signed)
[place] MEDICAID FL2 LEVEL OF CARE SCREENING TOOL     IDENTIFICATION  Patient Name: Adrian Gillespie Birthdate: 1950/02/28 Sex: male Admission Date (Current Location): 02/02/2020  [place] and [place] Number:  Chiropodist and Address:  [facility], [address] [place], [place] [address]      Provider Number:    Attending Physician Name and Address:  Alford Highland, MD  Relative Name and Phone Number:  BRAXSON HOLLINGSWORTH [phone]    Current Level of Care: Hospital Recommended Level of Care: Skilled Nursing Facility Prior Approval Number:    Date Approved/Denied:   PASRR Number: pending  Discharge Plan: SNF    Current Diagnoses: Patient Active Problem List   Diagnosis Date Noted  . Acquired thrombophilia (HCC)   . Intractable pain 02/02/2020  . 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 Femur fracture, left (HCC) 02/02/2020  . AF (paroxysmal atrial fibrillation) (HCC) 02/02/2020  . Malnutrition of moderate degree 05/26/2019  . Acute respiratory failure with hypoxemia (HCC) 05/26/2019  . CAP (community acquired pneumonia) 04/20/2019  . PVC (premature ventricular contraction) 04/20/2019  . GERD (gastroesophageal reflux disease) 04/20/2019  . Acute on chronic respiratory failure with hypoxia (HCC)   . HTN (hypertension)   . Chronic obstructive pulmonary disease (HCC)   . NSVT (nonsustained ventricular tachycardia) (HCC)   . Acute respiratory failure with hypoxia (HCC) 04/02/2019  . Hypokalemia 04/02/2019  . Schizophrenia (HCC) 04/02/2019    Orientation RESPIRATION BLADDER Height & Weight     Self,Situation,Place  Normal Continent Weight: 166 lb 3.6 oz (75.4 kg) Height:  5\' 9"  (175.3 cm)  BEHAVIORAL SYMPTOMS/MOOD NEUROLOGICAL BOWEL NUTRITION STATUS      Continent Diet  AMBULATORY STATUS COMMUNICATION OF NEEDS Skin   Extensive Assist Verbally Normal                       Personal Care Assistance Level of Assistance   Bathing,Feeding,Dressing Bathing Assistance: Maximum assistance Feeding assistance: Limited assistance Dressing Assistance: Maximum assistance     Functional Limitations Info  Sight,Hearing,Speech Sight Info: Adequate Hearing Info: Impaired Speech Info: Adequate    SPECIAL CARE FACTORS FREQUENCY  PT (By licensed PT),OT (By licensed OT)     PT Frequency: 5x per week OT Frequency: 5x per week            Contractures Contractures Info: Not present    Additional Factors Info  Code Status Code Status Info: full code             Current Medications (02/04/2020):  This is the current hospital active medication list Current Facility-Administered Medications  Medication Dose Route Frequency Provider Last Rate Last Admin  . 0.9 %  sodium chloride infusion  250 mL Intravenous PRN Wouk, 04/03/2020, MD      . albuterol (VENTOLIN HFA) 108 (90 Base) MCG/ACT inhaler 2 puff  2 puff Inhalation Q6H PRN Wouk, Wilfred Curtis, MD      . apixaban Wilfred Curtis) tablet 5 mg  5 mg Oral BID Everlene Balls, MD   5 mg at 02/04/20 0836  . benztropine (COGENTIN) tablet 1 mg  1 mg Oral Daily Wouk, 04/03/20, MD   1 mg at 02/04/20 0836  . haloperidol (HALDOL) tablet 10 mg  10 mg Oral BID 04/03/20, MD   10 mg at 02/04/20 0836  . hydrOXYzine (VISTARIL) capsule 50 mg  50 mg Oral BID PRN Wouk, 04/03/20, MD      . mometasone-formoterol [facility])  100-5 MCG/ACT inhaler 2 puff  2 puff Inhalation BID Kathrynn Running, MD   2 puff at 02/04/20 2054  . montelukast (SINGULAIR) tablet 10 mg  10 mg Oral Daily Kathrynn Running, MD   10 mg at 02/04/20 0836  . morphine 2 MG/ML injection 2 mg  2 mg Intravenous Q4H PRN Alford Highland, MD   2 mg at 02/04/20 2054  . oxyCODONE (Oxy IR/ROXICODONE) immediate release tablet 10 mg  10 mg Oral Q6H PRN Alford Highland, MD   10 mg at 02/04/20 0837  . pantoprazole (PROTONIX) EC tablet 40 mg  40 mg Oral Daily Kathrynn Running, MD   40 mg at 02/04/20 0836  .  sodium chloride flush (NS) 0.9 % injection 3 mL  3 mL Intravenous Q12H Wouk, Wilfred Curtis, MD   3 mL at 02/04/20 2054  . sodium chloride flush (NS) 0.9 % injection 3 mL  3 mL Intravenous PRN Wouk, Wilfred Curtis, MD      . tiotropium [facility]) inhalation capsule [facility] use ONLY) 18 mcg  18 mcg Inhalation Daily Kathrynn Running, MD   18 mcg at 02/04/20 [phone]  . traZODone (DESYREL) tablet 300 mg  300 mg Oral QHS Kathrynn Running, MD   300 mg at 02/03/20 2125     Discharge Medications: Please see discharge summary for a list of discharge medications.  Relevant Imaging Results:  Relevant Lab Results:   Additional Information soc sec # [id number]  Verna Czech [place], [place]

## 2020-02-04 NOTE — [place] FL2 (Cosign Needed)
[place] MEDICAID FL2 LEVEL OF CARE SCREENING TOOL     IDENTIFICATION  Patient Name: Adrian Gillespie Birthdate: [birth year] Sex: male Admission Date (Current Location): 02/02/2020  [place] and [place] Number:  Chiropodist and Address:  [facility], [address] [place], [place] [address]      Provider Number:    Attending Physician Name and Address:  Alford Highland, MD  Relative Name and Phone Number:  Adrian Gillespie [phone]    Current Level of Care: Hospital Recommended Level of Care: Skilled Nursing Facility Prior Approval Number:    Date Approved/Denied:   PASRR Number:    Discharge Plan: SNF    Current Diagnoses: Patient Active Problem List   Diagnosis Date Noted  . Acquired thrombophilia (HCC)   . Intractable pain 02/02/2020  . 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 Femur fracture, left (HCC) 02/02/2020  . AF (paroxysmal atrial fibrillation) (HCC) 02/02/2020  . Malnutrition of moderate degree 05/26/2019  . Acute respiratory failure with hypoxemia (HCC) 05/26/2019  . CAP (community acquired pneumonia) 04/20/2019  . PVC (premature ventricular contraction) 04/20/2019  . GERD (gastroesophageal reflux disease) 04/20/2019  . Acute on chronic respiratory failure with hypoxia (HCC)   . HTN (hypertension)   . Chronic obstructive pulmonary disease (HCC)   . NSVT (nonsustained ventricular tachycardia) (HCC)   . Acute respiratory failure with hypoxia (HCC) 04/02/2019  . Hypokalemia 04/02/2019  . Schizophrenia (HCC) 04/02/2019    Orientation RESPIRATION BLADDER Height & Weight     Self,Situation,Place  Normal Continent Weight: 166 lb 3.6 oz (75.4 kg) Height:  5\' 9"  (175.3 cm)  BEHAVIORAL SYMPTOMS/MOOD NEUROLOGICAL BOWEL NUTRITION STATUS      Continent Diet  AMBULATORY STATUS COMMUNICATION OF NEEDS Skin   Extensive Assist Verbally Normal                       Personal Care Assistance Level of Assistance   Bathing,Feeding,Dressing Bathing Assistance: Maximum assistance Feeding assistance: Limited assistance Dressing Assistance: Maximum assistance     Functional Limitations Info  Sight,Hearing,Speech Sight Info: Adequate Hearing Info: Impaired Speech Info: Adequate    SPECIAL CARE FACTORS FREQUENCY  PT (By licensed PT),OT (By licensed OT)     PT Frequency: 5x per week OT Frequency: 5x per week            Contractures Contractures Info: Not present    Additional Factors Info  Code Status Code Status Info: full code             Current Medications (02/04/2020):  This is the current hospital active medication list Current Facility-Administered Medications  Medication Dose Route Frequency Provider Last Rate Last Admin  . 0.9 %  sodium chloride infusion  250 mL Intravenous PRN Wouk, 04/03/2020, MD      . albuterol (VENTOLIN HFA) 108 (90 Base) MCG/ACT inhaler 2 puff  2 puff Inhalation Q6H PRN Wouk, Wilfred Curtis, MD      . apixaban Wilfred Curtis) tablet 5 mg  5 mg Oral BID Everlene Balls, MD   5 mg at 02/04/20 0836  . benztropine (COGENTIN) tablet 1 mg  1 mg Oral Daily Wouk, 04/03/20, MD   1 mg at 02/04/20 0836  . haloperidol (HALDOL) tablet 10 mg  10 mg Oral BID 04/03/20, MD   10 mg at 02/04/20 0836  . hydrOXYzine (VISTARIL) capsule 50 mg  50 mg Oral BID PRN Wouk, 04/03/20, MD      . mometasone-formoterol (  DULERA) 100-5 MCG/ACT inhaler 2 puff  2 puff Inhalation BID Kathrynn Running, MD   2 puff at 02/04/20 [phone]  . montelukast (SINGULAIR) tablet 10 mg  10 mg Oral Daily Kathrynn Running, MD   10 mg at 02/04/20 0836  . morphine 2 MG/ML injection 2 mg  2 mg Intravenous Q4H PRN Alford Highland, MD   2 mg at 02/04/20 1219  . oxyCODONE (Oxy IR/ROXICODONE) immediate release tablet 10 mg  10 mg Oral Q6H PRN Alford Highland, MD   10 mg at 02/04/20 0837  . pantoprazole (PROTONIX) EC tablet 40 mg  40 mg Oral Daily Kathrynn Running, MD   40 mg at 02/04/20 0836  .  sodium chloride flush (NS) 0.9 % injection 3 mL  3 mL Intravenous Q12H Wouk, Wilfred Curtis, MD   3 mL at 02/03/20 2126  . sodium chloride flush (NS) 0.9 % injection 3 mL  3 mL Intravenous PRN Wouk, Wilfred Curtis, MD      . tiotropium [facility]) inhalation capsule [facility] use ONLY) 18 mcg  18 mcg Inhalation Daily Kathrynn Running, MD   18 mcg at 02/04/20 [phone]  . traZODone (DESYREL) tablet 300 mg  300 mg Oral QHS Kathrynn Running, MD   300 mg at 02/03/20 2125     Discharge Medications: Please see discharge summary for a list of discharge medications.  Relevant Imaging Results:  Relevant Lab Results:   Additional Information soc sec # [id number]  Adrian Gillespie [place], [place]

## 2020-02-05 DIAGNOSIS — S7292XA Unspecified fracture of left femur, initial encounter for closed fracture: Secondary | ICD-10-CM | POA: Diagnosis not present

## 2020-02-05 DIAGNOSIS — J449 Chronic obstructive pulmonary disease, unspecified: Secondary | ICD-10-CM | POA: Diagnosis not present

## 2020-02-05 DIAGNOSIS — D6869 Other thrombophilia: Secondary | ICD-10-CM | POA: Diagnosis not present

## 2020-02-05 DIAGNOSIS — I480 Paroxysmal atrial fibrillation: Secondary | ICD-10-CM | POA: Diagnosis not present

## 2020-02-05 LAB — CBC
HCT: 37 % — ABNORMAL LOW (ref 39.0–52.0)
Hemoglobin: 12.4 g/dL — ABNORMAL LOW (ref 13.0–17.0)
MCH: 27.6 pg (ref 26.0–34.0)
MCHC: 33.5 g/dL (ref 30.0–36.0)
MCV: 82.4 fL (ref 80.0–100.0)
Platelets: 177 10*3/uL (ref 150–400)
RBC: 4.49 MIL/uL (ref 4.22–5.81)
RDW: 12.9 % (ref 11.5–15.5)
WBC: 11.9 10*3/uL — ABNORMAL HIGH (ref 4.0–10.5)
nRBC: 0 % (ref 0.0–0.2)

## 2020-02-05 LAB — BASIC METABOLIC PANEL
Anion gap: 9 (ref 5–15)
BUN: 10 mg/dL (ref 8–23)
CO2: 24 mmol/L (ref 22–32)
Calcium: 8.8 mg/dL — ABNORMAL LOW (ref 8.9–10.3)
Chloride: 102 mmol/L (ref 98–111)
Creatinine, Ser: 0.89 mg/dL (ref 0.61–1.24)
GFR, Estimated: >60 mL/min (ref >60)
Glucose, Bld: 117 mg/dL — ABNORMAL HIGH (ref 70–99)
Potassium: 4.1 mmol/L (ref 3.5–5.1)
Sodium: 135 mmol/L (ref 135–145)

## 2020-02-05 MED ORDER — POLYETHYLENE GLYCOL 3350 17 G PO PACK
17.0000 g | PACK | Freq: Two times a day (BID) | ORAL | Status: DC
  Administered 2020-02-05 – 2020-02-06 (×4): 17 g via ORAL
  Filled 2020-02-05 (×5): qty 1

## 2020-02-05 MED ORDER — ALBUTEROL SULFATE (2.5 MG/3ML) 0.083% IN NEBU
2.5000 mg | INHALATION_SOLUTION | Freq: Four times a day (QID) | RESPIRATORY_TRACT | Status: DC
  Administered 2020-02-05 – 2020-02-06 (×5): 2.5 mg via RESPIRATORY_TRACT
  Filled 2020-02-05 (×6): qty 3

## 2020-02-05 NOTE — Progress Notes (Addendum)
Physical Therapy Treatment Patient Details Name: Adrian Gillespie MRN: [id number] DOB: 1950/12/04 Today's Date: 02/05/2020    History of Present Illness Pt is a 70 y.o. male with a history of COPD, anxiety/depression, hyperlipidemia, hypertension, and schizophrenia who was in his usual state of health yesterday evening.  While at a grocery store, he slipped and fell on a wet floor, landing on his left hip.  He was brought to the emergency room where x-rays demonstrated a an essentially nondisplaced fracture of the left greater trochanter.  The patient's past medical history is notable for having undergone a left total hip arthroplasty at [facility] approximately 6 years ago for a left femoral neck fracture.    PT Comments    Pt comfortable at rest.  Agrees to session.  Slow gently ROM L hip.  Poor tolerance for ROM.  While doing ex, pt noted to be saturated with urine.  Stated he was aware but did not alert staff.  Rolling left/right with use of rails and min a x 1 to complete roll to allow for care.  Education provided regarding skin integrity and risk for sores if he does not maintain hygiene.  Voiced understanding but resisted rolling despite  "Do I have to?"  Completed task with encouragement. Declined attempts at sitting or further mobility   Follow Up Recommendations  SNF     Equipment Recommendations       Recommendations for Other Services       Precautions / Restrictions Precautions Precautions: Fall Restrictions Weight Bearing Restrictions: Yes LLE Weight Bearing: Touchdown weight bearing Other Position/Activity Restrictions: per ortho consult: "He may be mobilized with physical therapy toe-touch to partial weightbearing on the left leg with a walker as symptoms permit."    Mobility  Bed Mobility Overal bed mobility: Needs Assistance Bed Mobility: Rolling Rolling: Min assist         General bed mobility comments: Declined EOB or further attempts at mobility  Transfers                     Ambulation/Gait                 Stairs             Wheelchair Mobility    Modified Rankin (Stroke Patients Only)       Balance                                            Cognition Arousal/Alertness: Awake/alert Behavior During Therapy: WFL for tasks assessed/performed Overall Cognitive Status: Difficult to assess                                        Exercises Other Exercises Other Exercises: slow gentle ROM L LE tolerated poorly due to pain    General Comments        Pertinent Vitals/Pain Pain Assessment: Faces Faces Pain Scale: Hurts whole lot Pain Location: L hip Pain Descriptors / Indicators: Grimacing;Guarding;Constant;Discomfort;Moaning;Spasm Pain Intervention(s): Limited activity within patient's tolerance;Monitored during session;Repositioned    Home Living                      Prior Function            PT Goals (current goals can  now be found in the care plan section) Progress towards PT goals: Not progressing toward goals - comment    Frequency    7X/week      PT Plan Current plan remains appropriate    Co-evaluation              AM-PAC PT "6 Clicks" Mobility   Outcome Measure  Help needed turning from your back to your side while in a flat bed without using bedrails?: A Little Help needed moving from lying on your back to sitting on the side of a flat bed without using bedrails?: A Lot Help needed moving to and from a bed to a chair (including a wheelchair)?: Total Help needed standing up from a chair using your arms (e.g., wheelchair or bedside chair)?: Total Help needed to walk in hospital room?: Total Help needed climbing 3-5 steps with a railing? : Total 6 Click Score: 9    End of Session   Activity Tolerance: Patient limited by pain Patient left: in bed;with call bell/phone within reach;with bed alarm set Nurse Communication: Mobility  status;Precautions;Weight bearing status Pain - Right/Left: Left Pain - part of body: Hip     Time: 1145-1153 PT Time Calculation (min) (ACUTE ONLY): 8 min  Charges:  $Therapeutic Exercise: 8-22 mins                    Danielle Dess, PTA 02/05/20, 12:04 PM

## 2020-02-05 NOTE — Progress Notes (Signed)
Patient ID: Adrian Gillespie, male   DOB: 05-30-1950, 70 y.o.   MRN: [id number] [facility] PROGRESS NOTE  Adrian Gillespie SNK:[id number] DOB: [birth year] DOA: 02/02/2020 PCP: Andres Shad, MD  HPI/Subjective: Patient again states he is having 10 out of 10 pain.  He states the pain medication does help but he cannot scale his pain when I asked him how much is his pain after the pain medication.  Objective: Vitals:   02/05/20 0813 02/05/20 1214  BP: 112/79 131/83  Pulse: 79 86  Resp: 15 16  Temp: 97.9 F (36.6 C) 98.3 F (36.8 C)  SpO2: 91% 94%    Intake/Output Summary (Last 24 hours) at 02/05/2020 1405 Last data filed at 02/05/2020 0000 Gross per 24 hour  Intake 0 ml  Output 350 ml  Net -350 ml   Filed Weights   02/01/20 2135 02/02/20 0753  Weight: 75.4 kg 75.4 kg    ROS: Review of Systems  Respiratory: Negative for shortness of breath.   Cardiovascular: Negative for chest pain.  Gastrointestinal: Negative for abdominal pain.  Musculoskeletal: Positive for joint pain.   Exam: Physical Exam HENT:     Head: Normocephalic.     Mouth/Throat:     Pharynx: No oropharyngeal exudate.  Eyes:     General: Lids are normal.     Conjunctiva/sclera: Conjunctivae normal.  Cardiovascular:     Rate and Rhythm: Normal rate and regular rhythm.     Heart sounds: Normal heart sounds, S1 normal and S2 normal.  Pulmonary:     Breath sounds: Examination of the right-lower field reveals wheezing. Examination of the left-lower field reveals wheezing. Wheezing present. No decreased breath sounds, rhonchi or rales.  Abdominal:     Palpations: Abdomen is soft.     Tenderness: There is no abdominal tenderness.  Musculoskeletal:     Right lower leg: No swelling.     Left lower leg: No swelling.  Skin:    General: Skin is warm.     Findings: No rash.  Neurological:     Mental Status: He is alert.     Comments: Answers some yes or no questions.  Seems a little more sleepy today.        Data Reviewed: Basic Metabolic Panel: Recent Labs  Lab 02/02/20 0219 02/05/20 0738  NA 135 135  K 3.9 4.1  CL 104 102  CO2 20* 24  GLUCOSE 138* 117*  BUN 7* 10  CREATININE 1.03 0.89  CALCIUM 8.7* 8.8*   Liver Function Tests: Recent Labs  Lab 02/02/20 0219  AST 20  ALT 13  ALKPHOS 62  BILITOT 1.0  PROT 6.2*  ALBUMIN 3.6   CBC: Recent Labs  Lab 02/02/20 0219 02/05/20 0738  WBC 12.9* 11.9*  NEUTROABS 9.3*  --   HGB 14.3 12.4*  HCT 42.4 37.0*  MCV 82.7 82.4  PLT 215 177   BNP (last 3 results) Recent Labs    04/02/19 1157  BNP 132.0*     Recent Results (from the past 240 hour(s))  Resp Panel by RT-PCR (Flu A&B, Covid) Nasopharyngeal Swab     Status: None   Collection Time: 02/02/20 11:27 AM   Specimen: Nasopharyngeal Swab; Nasopharyngeal(NP) swabs in vial transport medium  Result Value Ref Range Status   SARS Coronavirus 2 by RT PCR NEGATIVE NEGATIVE Final    Comment: (NOTE) SARS-CoV-2 target nucleic acids are NOT DETECTED.  The SARS-CoV-2 RNA is generally detectable in upper respiratory specimens during the acute phase of infection. The  lowest concentration of SARS-CoV-2 viral copies this assay can detect is 138 copies/mL. A negative result does not preclude SARS-Cov-2 infection and should not be used as the sole basis for treatment or other patient management decisions. A negative result may occur with  improper specimen collection/handling, submission of specimen other than nasopharyngeal swab, presence of viral mutation(s) within the areas targeted by this assay, and inadequate number of viral copies(<138 copies/mL). A negative result must be combined with clinical observations, patient history, and epidemiological information. The expected result is Negative.  Fact Sheet for Patients:  BloggerCourse.com  Fact Sheet for Healthcare Providers:  SeriousBroker.it  This test is no t yet  approved or cleared by the [place] FDA and  has been authorized for detection and/or diagnosis of SARS-CoV-2 by FDA under an Emergency Use Authorization (EUA). This EUA will remain  in effect (meaning this test can be used) for the duration of the COVID-19 declaration under Section 564(b)(1) of the Act, 21 U.S.C.section 360bbb-3(b)(1), unless the authorization is terminated  or revoked sooner.       Influenza A by PCR NEGATIVE NEGATIVE Final   Influenza B by PCR NEGATIVE NEGATIVE Final    Comment: (NOTE) The Xpert Xpress SARS-CoV-2/FLU/RSV plus assay is intended as an aid in the diagnosis of influenza from Nasopharyngeal swab specimens and should not be used as a sole basis for treatment. Nasal washings and aspirates are unacceptable for Xpert Xpress SARS-CoV-2/FLU/RSV testing.  Fact Sheet for Patients: BloggerCourse.com  Fact Sheet for Healthcare Providers: SeriousBroker.it  This test is not yet approved or cleared by the [place] FDA and has been authorized for detection and/or diagnosis of SARS-CoV-2 by FDA under an Emergency Use Authorization (EUA). This EUA will remain in effect (meaning this test can be used) for the duration of the COVID-19 declaration under Section 564(b)(1) of the Act, 21 U.S.C. section 360bbb-3(b)(1), unless the authorization is terminated or revoked.  Performed at [facility], [address] [place], [place] [address]       Scheduled Meds: . apixaban  5 mg Oral BID  . benztropine  1 mg Oral Daily  . haloperidol  10 mg Oral BID  . mometasone-formoterol  2 puff Inhalation BID  . montelukast  10 mg Oral Daily  . pantoprazole  40 mg Oral Daily  . sodium chloride flush  3 mL Intravenous Q12H  . tiotropium  18 mcg Inhalation Daily  . traZODone  300 mg Oral QHS   Continuous Infusions: . sodium chloride      Assessment/Plan:  1. Left proximal closed femur fracture with  history of left hip replacement.  Nonsurgical management as per orthopedic surgery.  Continue with pain control.  Patient continues to state his pain is 10 out of 10 in intensity but looks sleepy today and I am hesitant on increasing pain control at this point.  The patient does state that the pain medications help but cannot quantify how much.  Physical therapy recommends rehab 2. Paroxysmal atrial fibrillation.  Continue Eliquis for anticoagulation 3. Acquired thrombophilia.  Eliquis to decrease the risk of stroke. 4. Schizophrenia on benztropine and haloperidol 5. COPD on Singulair, Spiriva and Dulera inhaler.  With slight expiratory wheeze we will add albuterol nebulizer.        Code Status:     Code Status Orders  (From admission, onward)         Start     Ordered   02/02/20 1301  Full code  Continuous  02/02/20 1300        Code Status History    Date Active Date Inactive Code Status Order ID Comments User Context   05/24/2019 0155 05/26/2019 2134 Full Code [id number]  [facility] Vernetta Honey, MD ED   04/20/2019 1027 04/23/2019 2152 Full Code [id number]  Lorretta Harp, MD ED   04/02/2019 1511 04/05/2019 2202 Full Code [id number]  Lucile Shutters, MD ED   Advance Care Planning Activity     Family Communication: Spoke with brother on the phone Disposition Plan: Status is: Inpatient  Dispo: The patient is from: Home              Anticipated d/c is to: Rehab              Anticipated d/c date is: Dependent on rehab bed availability and required [place]              Patient currently medically stable to go to rehab.  Time spent: 26 minutes  Adrian Gillespie [employer]

## 2020-02-06 DIAGNOSIS — S7292XS Unspecified fracture of left femur, sequela: Secondary | ICD-10-CM

## 2020-02-06 DIAGNOSIS — R5383 Other fatigue: Secondary | ICD-10-CM | POA: Diagnosis not present

## 2020-02-06 DIAGNOSIS — I480 Paroxysmal atrial fibrillation: Secondary | ICD-10-CM | POA: Diagnosis not present

## 2020-02-06 DIAGNOSIS — D6869 Other thrombophilia: Secondary | ICD-10-CM | POA: Diagnosis not present

## 2020-02-06 MED ORDER — ONDANSETRON HCL 4 MG/2ML IJ SOLN: 4.0000 mg | Freq: Four times a day (QID) | INTRAMUSCULAR | Status: DC | PRN

## 2020-02-06 MED ORDER — ACETAMINOPHEN 325 MG PO TABS: 650.0000 mg | ORAL_TABLET | Freq: Four times a day (QID) | ORAL | Status: DC | PRN

## 2020-02-06 MED ORDER — NALOXONE HCL 0.4 MG/ML IJ SOLN
0.4000 mg | INTRAMUSCULAR | Status: DC | PRN
  Administered 2020-02-06: 0.4 mg via INTRAVENOUS
  Filled 2020-02-06: qty 1

## 2020-02-06 MED ORDER — OXYCODONE HCL 5 MG PO TABS
5.0000 mg | ORAL_TABLET | Freq: Three times a day (TID) | ORAL | Status: DC | PRN
  Administered 2020-02-07: 5 mg via ORAL
  Filled 2020-02-06: qty 1

## 2020-02-06 MED ORDER — OXYCODONE HCL 5 MG PO TABS: 5.0000 mg | ORAL_TABLET | Freq: Three times a day (TID) | ORAL | Status: DC | PRN

## 2020-02-06 MED ORDER — OXYCODONE HCL 5 MG PO TABS
5.0000 mg | ORAL_TABLET | Freq: Four times a day (QID) | ORAL | Status: DC | PRN
  Administered 2020-02-06: 5 mg via ORAL
  Filled 2020-02-06: qty 1

## 2020-02-06 NOTE — Progress Notes (Signed)
Subjective: Patient appears to be quite groggy this morning, but has no new complaints pertaining to his left hip.  He still notes moderate to severe pain in the hip with attempted ambulation or mobilization.   Objective: Vital signs in last 24 hours: Temp:  [97.9 F (36.6 C)-98.9 F (37.2 C)] 98.7 F (37.1 C) (01/10 0718) Pulse Rate:  [79-100] 80 (01/10 0718) Resp:  [15-19] 19 (01/10 0718) BP: (112-131)/(79-97) 118/97 (01/10 0718) SpO2:  [91 %-94 %] 92 % (01/10 0718)  Intake/Output from previous day: 01/09 0701 - 01/10 0700 In: 120 [P.O.:120] Out: 100 [Urine:100] Intake/Output this shift: No intake/output data recorded.  Recent Labs    02/05/20 0738  HGB 12.4*   Recent Labs    02/05/20 0738  WBC 11.9*  RBC 4.49  HCT 37.0*  PLT 177   Recent Labs    02/05/20 0738  NA 135  K 4.1  CL 102  CO2 24  BUN 10  CREATININE 0.89  GLUCOSE 117*  CALCIUM 8.8*   No results for input(s): LABPT, INR in the last 72 hours.  Physical Exam: Orthopedic examination is unchanged as compared to his examination on admission.  He still has some tenderness to palpation over the lateral aspect of the left hip, and more severe pain with any attempted active or passive motion of the left hip or lower extremity.  He is neurovascular intact to the left foot.  Assessment: Nondisplaced left greater trochanteric fracture status post prior total hip arthroplasty.  Plan: The patient may continue to be mobilized with physical therapy, toe-touch weightbearing on the left leg, as symptoms permit.  He may continue to receive appropriate pain medication as felt to be indicated clinically.  Thank you for asked me to participate in the care of this most unfortunate man.  I will sign off at this time.  Please arrange for him to follow-up in our office with either myself or Janell Quiet, PA-C, in 2 weeks for repeat x-rays.   Excell Seltzer Phillipe Clemon 02/06/2020, 8:08 AM

## 2020-02-06 NOTE — Progress Notes (Signed)
Patient ID: Adrian Gillespie, male   DOB: 11-Aug-1950, 70 y.o.   MRN: [id number] [facility] PROGRESS NOTE  Adrian Gillespie IRS:[id number] DOB: 08/25/1950 DOA: 02/02/2020 PCP: Andres Shad, MD  HPI/Subjective: Patient lethargic this morning and answers a few questions but fell back asleep.  Initially came in with left femoral fracture.  Objective: Vitals:   02/06/20 1126 02/06/20 1517  BP: 127/85 (!) 116/95  Pulse: 75 95  Resp: 16 16  Temp: 98.5 F (36.9 C) 98 F (36.7 C)  SpO2: 95% 90%    Filed Weights   02/01/20 2135 02/02/20 0753  Weight: 75.4 kg 75.4 kg    ROS: Review of Systems  Unable to perform ROS: Acuity of condition  Musculoskeletal: Positive for joint pain.   Exam: Physical Exam HENT:     Head: Normocephalic.     Mouth/Throat:     Pharynx: No oropharyngeal exudate.  Eyes:     General: Lids are normal.     Conjunctiva/sclera: Conjunctivae normal.     Pupils: Pupils are equal, round, and reactive to light.  Cardiovascular:     Rate and Rhythm: Normal rate and regular rhythm.     Heart sounds: Normal heart sounds, S1 normal and S2 normal.  Pulmonary:     Breath sounds: No decreased breath sounds, wheezing, rhonchi or rales.  Abdominal:     Palpations: Abdomen is soft.     Tenderness: There is no abdominal tenderness.  Musculoskeletal:     Right lower leg: No swelling.     Left lower leg: No swelling.  Skin:    General: Skin is warm.     Findings: No rash.  Neurological:     Mental Status: He is lethargic.       Data Reviewed: Basic Metabolic Panel: Recent Labs  Lab 02/02/20 0219 02/05/20 0738  NA 135 135  K 3.9 4.1  CL 104 102  CO2 20* 24  GLUCOSE 138* 117*  BUN 7* 10  CREATININE 1.03 0.89  CALCIUM 8.7* 8.8*   Liver Function Tests: Recent Labs  Lab 02/02/20 0219  AST 20  ALT 13  ALKPHOS 62  BILITOT 1.0  PROT 6.2*  ALBUMIN 3.6   CBC: Recent Labs  Lab 02/02/20 0219 02/05/20 0738  WBC 12.9* 11.9*  NEUTROABS 9.3*  --    HGB 14.3 12.4*  HCT 42.4 37.0*  MCV 82.7 82.4  PLT 215 177   BNP (last 3 results) Recent Labs    04/02/19 1157  BNP 132.0*     Recent Results (from the past 240 hour(s))  Resp Panel by RT-PCR (Flu A&B, Covid) Nasopharyngeal Swab     Status: None   Collection Time: 02/02/20 11:27 AM   Specimen: Nasopharyngeal Swab; Nasopharyngeal(NP) swabs in vial transport medium  Result Value Ref Range Status   SARS Coronavirus 2 by RT PCR NEGATIVE NEGATIVE Final    Comment: (NOTE) SARS-CoV-2 target nucleic acids are NOT DETECTED.  The SARS-CoV-2 RNA is generally detectable in upper respiratory specimens during the acute phase of infection. The lowest concentration of SARS-CoV-2 viral copies this assay can detect is 138 copies/mL. A negative result does not preclude SARS-Cov-2 infection and should not be used as the sole basis for treatment or other patient management decisions. A negative result may occur with  improper specimen collection/handling, submission of specimen other than nasopharyngeal swab, presence of viral mutation(s) within the areas targeted by this assay, and inadequate number of viral copies(<138 copies/mL). A negative result must be combined with clinical  observations, patient history, and epidemiological information. The expected result is Negative.  Fact Sheet for Patients:  BloggerCourse.com  Fact Sheet for Healthcare Providers:  SeriousBroker.it  This test is no t yet approved or cleared by the [place] FDA and  has been authorized for detection and/or diagnosis of SARS-CoV-2 by FDA under an Emergency Use Authorization (EUA). This EUA will remain  in effect (meaning this test can be used) for the duration of the COVID-19 declaration under Section 564(b)(1) of the Act, 21 U.S.C.section 360bbb-3(b)(1), unless the authorization is terminated  or revoked sooner.       Influenza A by PCR NEGATIVE NEGATIVE  Final   Influenza B by PCR NEGATIVE NEGATIVE Final    Comment: (NOTE) The Xpert Xpress SARS-CoV-2/FLU/RSV plus assay is intended as an aid in the diagnosis of influenza from Nasopharyngeal swab specimens and should not be used as a sole basis for treatment. Nasal washings and aspirates are unacceptable for Xpert Xpress SARS-CoV-2/FLU/RSV testing.  Fact Sheet for Patients: BloggerCourse.com  Fact Sheet for Healthcare Providers: SeriousBroker.it  This test is not yet approved or cleared by the [place] FDA and has been authorized for detection and/or diagnosis of SARS-CoV-2 by FDA under an Emergency Use Authorization (EUA). This EUA will remain in effect (meaning this test can be used) for the duration of the COVID-19 declaration under Section 564(b)(1) of the Act, 21 U.S.C. section 360bbb-3(b)(1), unless the authorization is terminated or revoked.  Performed at [facility], [address] [place], [place] [address]       Scheduled Meds: . albuterol  2.5 mg Nebulization Q6H  . apixaban  5 mg Oral BID  . benztropine  1 mg Oral Daily  . haloperidol  10 mg Oral BID  . mometasone-formoterol  2 puff Inhalation BID  . montelukast  10 mg Oral Daily  . pantoprazole  40 mg Oral Daily  . polyethylene glycol  17 g Oral BID  . sodium chloride flush  3 mL Intravenous Q12H  . tiotropium  18 mcg Inhalation Daily  . traZODone  300 mg Oral QHS   Continuous Infusions: . sodium chloride      Assessment/Plan:  1. Lethargy likely oversedated with pain medications.  Discontinue IV pain medications.  Decrease oral pain medications and spread out the duration of when he can take it.  The patient only states he has 10 out of 10 pain.  Need to avoid overmedicating.  As needed Narcan.  In speaking with nursing staff.  Patient still lethargic this afternoon.  Asked to give him 1 dose of 0.4 Narcan.   2. Left proximal closed femur  fracture with history of left hip replacement.  Nonsurgical management as per orthopedic surgery. 3. Paroxysmal atrial fibrillation on Eliquis for anticoagulation. 4. Acquired thrombophilia.  Eliquis to decrease the risk of stroke. 5. COPD on Singulair, Spiriva and Dulera inhaler.  Added albuterol nebulizer and Solu-Medrol. 6. Schizophrenia on benztropine and haloperidol        Code Status:     Code Status Orders  (From admission, onward)         Start     Ordered   02/02/20 1301  Full code  Continuous        02/02/20 1300        Code Status History    Date Active Date Inactive Code Status Order ID Comments User Context   05/24/2019 0155 05/26/2019 2134 Full Code [id number]  Hannah Beat, MD ED   04/20/2019  1027 04/23/2019 2152 Full Code [id number]  Lorretta Harp, MD ED   04/02/2019 1511 04/05/2019 2202 Full Code [id number]  Lucile Shutters, MD ED   Advance Care Planning Activity     Family Communication: Spoke with brother on the phone about plan. Disposition Plan: Status is: Inpatient  Dispo: The patient is from: Home              Anticipated d/c is to: Rehab              Anticipated d/c date is: No rehab beds yet.  We will also need to passar              Patient currently lethargic today and will get a dose of Narcan.  Decrease pain medications and spread out frequency.  Time spent: 28 minutes  Lynelle Weiler [employer]

## 2020-02-06 NOTE — Progress Notes (Signed)
Physical Therapy Treatment Patient Details Name: Adrian Gillespie MRN: [id number] DOB: December 21, 1950 Today's Date: 02/06/2020    History of Present Illness Pt is a 70 y.o. male with a history of COPD, anxiety/depression, hyperlipidemia, hypertension, and schizophrenia who was in his usual state of health yesterday evening.  While at a grocery store, he slipped and fell on a wet floor, landing on his left hip.  He was brought to the emergency room where x-rays demonstrated a an essentially nondisplaced fracture of the left greater trochanter.  The patient's past medical history is notable for having undergone a left total hip arthroplasty at [facility] approximately 6 years ago for a left femoral neck fracture.    PT Comments    Participated in exercises as described below.  Pt able to transition to sitting with min a x 1.  Sat EOB x 10 minutes before transitioning back to bed with mod a x 1.  Pt inc of urine again today.  Stated he is typically inc at home but is aware when he wets. He did not alert staff again despite saturated bed. Education provided and full linen change completed with bathing.     Follow Up Recommendations  SNF     Equipment Recommendations  Rolling walker with 5" wheels;3in1 (PT)    Recommendations for Other Services       Precautions / Restrictions Precautions Precautions: Fall Restrictions Weight Bearing Restrictions: Yes LLE Weight Bearing: Touchdown weight bearing Other Position/Activity Restrictions: per ortho consult: "He may be mobilized with physical therapy toe-touch to partial weightbearing on the left leg with a walker as symptoms permit."    Mobility  Bed Mobility Overal bed mobility: Needs Assistance Bed Mobility: Rolling Rolling: Min assist   Supine to sit: Min assist Sit to supine: Mod assist      Transfers                    Ambulation/Gait                 Stairs             Wheelchair Mobility    Modified Rankin  (Stroke Patients Only)       Balance Overall balance assessment: Needs assistance Sitting-balance support: Feet supported Sitting balance-Leahy Scale: Fair Sitting balance - Comments: Pt with increased pain when sitting upright with pressure through L hip. Postural control: Right lateral lean                                  Cognition Arousal/Alertness: Awake/alert Behavior During Therapy: WFL for tasks assessed/performed Overall Cognitive Status: Difficult to assess                                        Exercises Other Exercises Other Exercises: slow gentle ROM L LE tolerated poorly due to pain    General Comments        Pertinent Vitals/Pain Pain Assessment: Faces Faces Pain Scale: Hurts even more Pain Location: L hip Pain Descriptors / Indicators: Grimacing;Guarding;Constant;Discomfort;Moaning;Spasm Pain Intervention(s): Limited activity within patient's tolerance;Monitored during session;Repositioned    Home Living                      Prior Function            PT Goals (current goals  can now be found in the care plan section) Progress towards PT goals: Progressing toward goals    Frequency    7X/week      PT Plan Current plan remains appropriate    Co-evaluation              AM-PAC PT "6 Clicks" Mobility   Outcome Measure  Help needed turning from your back to your side while in a flat bed without using bedrails?: A Little Help needed moving from lying on your back to sitting on the side of a flat bed without using bedrails?: A Lot Help needed moving to and from a bed to a chair (including a wheelchair)?: Total Help needed standing up from a chair using your arms (e.g., wheelchair or bedside chair)?: Total Help needed to walk in hospital room?: Total Help needed climbing 3-5 steps with a railing? : Total 6 Click Score: 9    End of Session   Activity Tolerance: Patient tolerated treatment  well Patient left: in bed;with call bell/phone within reach;with bed alarm set Nurse Communication: Mobility status;Precautions;Weight bearing status Pain - Right/Left: Left Pain - part of body: Hip     Time: 1130-1145 PT Time Calculation (min) (ACUTE ONLY): 15 min  Charges:  $Therapeutic Activity: 8-22 mins                    Danielle Dess, PTA 02/06/20, 1:03 PM

## 2020-02-06 NOTE — Plan of Care (Signed)
Pt resting in bed at this time,NAD.Pt alert and verbal,slow response to verbal commands but easily oriented.c/o L hip pain 10/10 was administered prn oxycodone.effectiveness pending.Appetite and fluid intake fair.Pt progressing with care plans.no changes in baseline.will cont to monitor.

## 2020-02-06 NOTE — TOC Progression Note (Signed)
Transition of Care [facility]) - Progression Note    Patient Details  Name: Dezmon Conover MRN: [id number] Date of Birth: [birth year]  [facility]) CM/SW Contact  Trenton Founds, RN Phone Number: 02/06/2020, 8:24 AM  Clinical Narrative:   Patient is still pending bed offers. RNCM uploaded additional clinical information for PASSR.     Expected Discharge Plan: Skilled Nursing Facility Barriers to Discharge: No Barriers Identified  Expected Discharge Plan and Services Expected Discharge Plan: Skilled Nursing Facility In-house Referral: Clinical Social Work   Post Acute Care Choice: Skilled Nursing Facility Living arrangements for the past 2 months: Independent Living Facility                                       Social Determinants of Health (SDOH) Interventions    Readmission Risk Interventions Readmission Risk Prevention Plan 05/25/2019  Transportation Screening Complete  PCP or Specialist Appt within 3-5 Days Complete  HRI or Home Care Consult Complete  Social Work Consult for Recovery Care Planning/Counseling Complete  Palliative Care Screening Not Applicable  Medication Review [employer]) Complete

## 2020-02-06 NOTE — Progress Notes (Addendum)
Occupational Therapy Treatment Patient Details Name: Adrian Gillespie MRN: [id number] DOB: March 22, 1950 Today's Date: 02/06/2020    History of present illness Pt is a 70 y.o. male with a history of COPD, anxiety/depression, hyperlipidemia, hypertension, and schizophrenia who was in his usual state of health yesterday evening.  While at a grocery store, he slipped and fell on a wet floor, landing on his left hip.  He was brought to the emergency room where x-rays demonstrated a an essentially nondisplaced fracture of the left greater trochanter.  The patient's past medical history is notable for having undergone a left total hip arthroplasty at [facility] approximately 6 years ago for a left femoral neck fracture.   OT comments  Pt seen for OT treatment on this date. Upon arrival to room pt was awake and seated upright in bed. Pt was agreeable to session, and able to participate in seated UB dressing at EOB with SUPERVISION/SET-UP. Pt instructed in adaptive strategies for LB dressing using reacher, attempting x2 trials, but requiring MAX A to complete task in setting of increased pain when sitting upright with pressure through L hip. Pt was agreeable to OOB mobility, requiring MOD A for steadying and MAX verbal cues for hand and L LE placement during x2 sit>stand trials; pt was able to reach upright standing posture for 10sec. Pt continues to benefit from skilled OT services to maximize return to PLOF and minimize risk of future falls, injury, and readmission. Will continue to follow POC. Discharge recommendation remains appropriate.    Follow Up Recommendations  SNF    Equipment Recommendations  3 in 1 bedside commode;Tub/shower seat;Other (comment) (2ww)       Precautions / Restrictions Precautions Precautions: Fall Restrictions Weight Bearing Restrictions: Yes LLE Weight Bearing: Touchdown weight bearing Other Position/Activity Restrictions: per ortho consult: "He may be mobilized with physical therapy  toe-touch to partial weightbearing on the left leg with a walker as symptoms permit."       Mobility Bed Mobility Overal bed mobility: Needs Assistance    Supine to sit: Min assist Sit to supine: Mod assist   General bed mobility comments: Required assist for LE control  Transfers Overall transfer level: Needs assistance Equipment used: Rolling walker (2 wheeled) Transfers: Sit to/from Stand Sit to Stand: Mod assist;From elevated surface         General transfer comment: x2 sit>stand trials from elevated surface; required MAX verbal cues for hand placement and for adherance to WB precaution. Able to stand for 10 sec each trial. Pt deferred further mobility in setting of anxiety with OOB mobility    Balance Overall balance assessment: Needs assistance Sitting-balance support: Bilateral upper extremity supported;Feet supported Sitting balance-Leahy Scale: Good Sitting balance - Comments: Pt with increased pain when sitting upright with pressure through L hip. Able to tolerate LB dressing education for ~76mins Postural control: Right lateral lean   Standing balance-Leahy Scale: Poor Standing balance comment: Standing near EOB for ~10 sec for x2 trials. Required MOD A to maintain balance in setting of posterior lean                           ADL either performed or assessed with clinical judgement   ADL Overall ADL's : Needs assistance/impaired                 Upper Body Dressing : Sitting;Supervision/safety;Set up Upper Body Dressing Details (indicate cue type and reason): To don/doff hospital gown sitting EOB Lower Body  Dressing: Maximal assistance;Sit to/from stand Lower Body Dressing Details (indicate cue type and reason): To doff socks with reacher                               Cognition Arousal/Alertness: Awake/alert Behavior During Therapy: WFL for tasks assessed/performed Overall Cognitive Status: No family/caregiver present to  determine baseline cognitive functioning                                                     Pertinent Vitals/ Pain       Pain Assessment: 0-10 Pain Score: 10-Worst pain ever Faces Pain Scale: Hurts even more Pain Location: L hip Pain Descriptors / Indicators: Discomfort Pain Intervention(s): Limited activity within patient's tolerance;Monitored during session;Repositioned;Utilized relaxation techniques         Frequency  Min 1X/week        Progress Toward Goals  OT Goals(current goals can now be found in the care plan section)  Progress towards OT goals: Progressing toward goals  Acute Rehab OT Goals Patient Stated Goal: to decrease pain OT Goal Formulation: With patient Time For Goal Achievement: 02/17/20 Potential to Achieve Goals: Good  Plan Discharge plan remains appropriate;Frequency remains appropriate       AM-PAC OT "6 Clicks" Daily Activity     Outcome Measure   Help from another person eating meals?: None Help from another person taking care of personal grooming?: A Little Help from another person toileting, which includes using toliet, bedpan, or urinal?: A Lot Help from another person bathing (including washing, rinsing, drying)?: A Lot Help from another person to put on and taking off regular upper body clothing?: A Little Help from another person to put on and taking off regular lower body clothing?: A Lot 6 Click Score: 16    End of Session Equipment Utilized During Treatment: Gait belt;Rolling walker  OT Visit Diagnosis: Unsteadiness on feet (R26.81);Other abnormalities of gait and mobility (R26.89);Muscle weakness (generalized) (M62.81);History of falling (Z91.81)   Activity Tolerance Patient limited by pain   Patient Left in bed;with call bell/phone within reach;with bed alarm set   Nurse Communication Mobility status        Time: 1435-1500 OT Time Calculation (min): 25 min  Charges: OT General Charges $OT Visit:  1 Visit OT Treatments $Self Care/Home Management : 8-22 mins $Therapeutic Activity: 8-22 mins  Matthew Folks, OTR/L [facility] [phone]

## 2020-02-06 NOTE — Care Management Important Message (Signed)
Important Message  Patient Details  Name: Adrian Gillespie MRN: [id number] Date of Birth: 28-Nov-1950   Medicare Important Message Given:  Yes     [name] 02/06/2020, 11:19 AM

## 2020-02-07 DIAGNOSIS — D6869 Other thrombophilia: Secondary | ICD-10-CM | POA: Diagnosis not present

## 2020-02-07 DIAGNOSIS — I480 Paroxysmal atrial fibrillation: Secondary | ICD-10-CM | POA: Diagnosis not present

## 2020-02-07 DIAGNOSIS — S7292XS Unspecified fracture of left femur, sequela: Secondary | ICD-10-CM | POA: Diagnosis not present

## 2020-02-07 DIAGNOSIS — J449 Chronic obstructive pulmonary disease, unspecified: Secondary | ICD-10-CM | POA: Diagnosis not present

## 2020-02-07 LAB — RESP PANEL BY RT-PCR (FLU A&B, COVID) ARPGX2
Influenza A by PCR: NEGATIVE
Influenza B by PCR: NEGATIVE
SARS Coronavirus 2 by RT PCR: NEGATIVE

## 2020-02-07 MED ORDER — ACETAMINOPHEN 325 MG PO TABS: 650.0000 mg | ORAL_TABLET | Freq: Four times a day (QID) | ORAL | Status: AC | PRN

## 2020-02-07 MED ORDER — OXYCODONE HCL 5 MG PO TABS: 5.0000 mg | ORAL_TABLET | Freq: Three times a day (TID) | ORAL | 0 refills | Status: DC | PRN

## 2020-02-07 MED ORDER — NALOXONE HCL 4 MG/0.1ML NA LIQD: NASAL | 0 refills | Status: AC

## 2020-02-07 MED ORDER — ALBUTEROL SULFATE (2.5 MG/3ML) 0.083% IN NEBU: 2.5000 mg | INHALATION_SOLUTION | Freq: Two times a day (BID) | RESPIRATORY_TRACT | Status: DC

## 2020-02-07 MED ORDER — POLYETHYLENE GLYCOL 3350 17 G PO PACK: 17.0000 g | PACK | Freq: Every day | ORAL | 0 refills | Status: AC

## 2020-02-07 MED ORDER — POLYETHYLENE GLYCOL 3350 17 G PO PACK: 17.0000 g | PACK | Freq: Every day | ORAL | 0 refills | Status: DC

## 2020-02-07 MED ORDER — BISACODYL 10 MG RE SUPP: 10.0000 mg | Freq: Once | RECTAL | Status: DC

## 2020-02-07 MED ORDER — NALOXONE HCL 0.4 MG/ML IJ SOLN: 0.4000 mg | INTRAMUSCULAR | 0 refills | Status: DC | PRN

## 2020-02-07 NOTE — Discharge Instructions (Signed)
Femoral Shaft Fracture  A femoral shaft fracture is a break in the long, straight part (shaft) of the thigh bone (femur). This condition is almost always treated with surgery. What are the causes? This condition may be caused by a forceful impact, such as from:  A fall, especially from a great height.  A sports injury.  A car or motorcycle accident. What increases the risk? This condition is more likely to develop in people who:  Are older. The risk increases with age.  Have certain medical conditions that cause bones to become weak and thin, such as osteoporosis.  Take medicines for osteoporosis.  Play high-risk or high-impact sports. What are the signs or symptoms? Symptoms of this condition include:  Severe pain.  Inability to walk.  Bruising.  Swelling.  The thigh looking misshapen (deformity). If the fracture broke the skin (open fracture), there may also be bleeding. How is this diagnosed? This condition is diagnosed based on:  Your symptoms and medical history.  A physical exam.  X-rays. How is this treated? This condition is usually treated with one or more of the following:  Surgery to fix the bone pieces into place with pins that are attached to a stabilizing bar outside your skin (external fixation).  Surgery to insert a rod and screws into your femur (intramedullary nailing). If you had external fixation, you may also need intramedullary nailing a few weeks later.  Surgery to place metal plates on the femur to hold it in place. This may be done if your fracture is closer to an end of your femur. In rare cases, surgery may not be an option. In that case, a cast or a splint will be placed on your leg to hold it in place while the bone heals (immobilization). Treatment may also include:  Not putting weight on your leg until it heals (weight-bearing restrictions).  Using a device to help you move around (assistive device), such as crutches or a  wheelchair.  Physical therapy. Follow these instructions at home: If you have a cast:  Do not stick anything inside the cast to scratch your skin. Doing that increases your risk of infection.  Check the skin around the cast every day. Tell your health care provider about any concerns.  You may put lotion on dry skin around the edges of the cast. Do not put lotion on the skin underneath the cast.  Keep the cast clean.  If the cast is not waterproof: ? Do not let it get wet. ? Cover it with a watertight covering when you take a bath or a shower. If you have a splint:  Wear the splint as told by your health care provider. Remove it only as told by your health care provider.  Loosen the splint if your toes tingle, become numb, or turn cold and blue.  Keep the splint clean.  If you have a splint that is not waterproof: ? Do not let it get wet. ? Cover it with a watertight covering when you take a bath or a shower. Activity  Do not use your leg to support your body weight until your health care provider says that you can. Follow weight-bearing restrictions.  Use crutches, a wheelchair, or other assistive devices as directed.  Ask your health care provider what activities are safe for you during recovery, and what activities you need to avoid.  Do physical therapy exercises as directed. Medicines  Take over-the-counter and prescription medicines only as told by your health  care provider.  Ask your health care provider if you should take supplements of calcium and vitamins C and D to help your bone heal. Managing pain, stiffness, and swelling  If directed, put ice on painful areas: ? If you have a removable splint, remove it as told by your health care provider. ? Put ice in a plastic bag. ? Place a towel between your skin and the bag, or between your cast and the bag. ? Leave the ice on for 20 minutes, 2-3 times a day.  Move your toes often to avoid stiffness and to lessen  swelling.  Raise (elevate) your lower leg above the level of your heart while you are lying down or sitting, whenever possible.   General instructions  Do not put pressure on any part of the cast or splint until it is fully hardened, if applicable. This may take several hours.  Do not drive until your health care provider approves. You should not drive or use heavy machinery while taking prescription pain medicine.  Do not use any products that contain nicotine or tobacco, such as cigarettes and e-cigarettes. These can delay bone healing. If you need help quitting, ask your health care provider.  Do not take baths, swim, or use a hot tub until your health care provider approves. Ask your health care provider if you may take showers. You may only be allowed to take sponge baths.  Keep all follow-up visits as told by your health care provider. This is important. Contact a health care provider if you have:  Pain that gets worse or does not get better with medicine.  Redness or swelling that gets worse.  A fever. Get help right away if you have:  Any of the following symptoms in your toes or feet, even after loosening your splint: ? Coldness. ? Blue skin. ? Numbness. ? Tingling.  Severe pain.  Pain that gets worse when you move your toes.  Chest pain.  Shortness of breath. Summary  A femoral shaft fracture is a break in the long, straight part (shaft) of your thigh bone (femur). This is almost always treated with surgery.  This condition may be caused by a forceful impact.  Do not use your leg to support your body weight until your health care provider says that you can. Follow weight-bearing restrictions as directed.  Keep all follow-up visits as told by your health care provider. This is important. This information is not intended to replace advice given to you by your health care provider. Make sure you discuss any questions you have with your health care  provider. Document Revised: 03/02/2017 Document Reviewed: 03/02/2017 Elsevier Patient Education  2021 [employer].

## 2020-02-07 NOTE — Progress Notes (Signed)
Physical Therapy Treatment Patient Details Name: Adrian Gillespie MRN: [id number] DOB: 01-23-1951 Today's Date: 02/07/2020    History of Present Illness Pt is a 70 y.o. male with a history of COPD, anxiety/depression, hyperlipidemia, hypertension, and schizophrenia who was in his usual state of health yesterday evening.  While at a grocery store, he slipped and fell on a wet floor, landing on his left hip.  He was brought to the emergency room where x-rays demonstrated a an essentially nondisplaced fracture of the left greater trochanter.  The patient's past medical history is notable for having undergone a left total hip arthroplasty at [facility] approximately 6 years ago for a left femoral neck fracture.    PT Comments    Pt was long sitting in bed finishing breakfast upon arriving. He agrees to PT session and is cooperative throughout. Pt requested to use BSC for BM. Was able to stand and take steps to [facility] with assistance. Unsuccessful BM. Stood and took steps to recliner. Was able to adhere to proper wt bearing. Overall pt is progressing towards PT goals.Slowed progress over past few days due to pain.  Would benefit form SNF at [place] to address deficits while assisting pt to PLOF. Acute PT will continue to progress per current POC.    Follow Up Recommendations  SNF     Equipment Recommendations  Rolling walker with 5" wheels;3in1 (PT)    Recommendations for Other Services       Precautions / Restrictions Precautions Precautions: Fall Restrictions Weight Bearing Restrictions: Yes LLE Weight Bearing: Touchdown weight bearing    Mobility  Bed Mobility Overal bed mobility: Needs Assistance Bed Mobility: Supine to Sit;Sit to Supine Rolling: Min assist   Supine to sit: Min assist     General bed mobility comments: Min assist to exit R side of bed with vsc for technique and sequencing  Transfers Overall transfer level: Needs assistance Equipment used: Rolling walker (2 wheeled) Transfers:  Sit to/from Stand Sit to Stand: Min assist;From elevated surface         General transfer comment: Min asisst for safety to stand to RW from elevated bed height  Ambulation/Gait Ambulation/Gait assistance: Min assist Gait Distance (Feet): 3 Feet Assistive device: Rolling walker (2 wheeled) Gait Pattern/deviations: Step-to pattern;Antalgic Gait velocity: decreased   General Gait Details: Pt was able to take 3 steps to [facility] then stanbd and take 3 steps to recliner       Balance Overall balance assessment: Needs assistance Sitting-balance support: Bilateral upper extremity supported;Feet supported Sitting balance-Leahy Scale: Good     Standing balance support: Bilateral upper extremity supported;During functional activity Standing balance-Leahy Scale: Fair           Cognition Arousal/Alertness: Awake/alert Behavior During Therapy: WFL for tasks assessed/performed Overall Cognitive Status: No family/caregiver present to determine baseline cognitive functioning    General Comments: Pt is A and O x 4             Pertinent Vitals/Pain Pain Assessment: 0-10 Pain Score: 9  Faces Pain Scale: Hurts whole lot Pain Location: L hip Pain Descriptors / Indicators: Discomfort Pain Intervention(s): Limited activity within patient's tolerance;Monitored during session;Premedicated before session           PT Goals (current goals can now be found in the care plan section) Acute Rehab PT Goals Patient Stated Goal: to decrease pain Progress towards PT goals: Progressing toward goals    Frequency    7X/week      PT Plan Current plan remains  appropriate       AM-PAC PT "6 Clicks" Mobility   Outcome Measure  Help needed turning from your back to your side while in a flat bed without using bedrails?: A Little Help needed moving from lying on your back to sitting on the side of a flat bed without using bedrails?: A Little Help needed moving to and from a bed to a chair  (including a wheelchair)?: A Little Help needed standing up from a chair using your arms (e.g., wheelchair or bedside chair)?: A Lot Help needed to walk in hospital room?: A Lot Help needed climbing 3-5 steps with a railing? : A Lot 6 Click Score: 15    End of Session Equipment Utilized During Treatment: Gait belt Activity Tolerance: Patient tolerated treatment well Patient left: in chair;with call bell/phone within reach;with chair alarm set Nurse Communication: Mobility status;Precautions;Weight bearing status PT Visit Diagnosis: Other abnormalities of gait and mobility (R26.89);Muscle weakness (generalized) (M62.81);Difficulty in walking, not elsewhere classified (R26.2);Pain Pain - Right/Left: Left Pain - part of body: Hip     Time: 3154-0086 PT Time Calculation (min) (ACUTE ONLY): 25 min  Charges:  $Therapeutic Activity: 23-37 mins                     Jetta Lout PTA 02/07/20, 10:32 AM

## 2020-02-07 NOTE — Plan of Care (Signed)
  Problem: Education: Goal: Knowledge of General Education information will improve Description: Including pain rating scale, medication(s)/side effects and non-pharmacologic comfort measures Outcome: Completed/Met   Problem: Clinical Measurements: Goal: Ability to maintain clinical measurements within normal limits will improve Outcome: Completed/Met Goal: Will remain free from infection Outcome: Completed/Met Goal: Diagnostic test results will improve Outcome: Completed/Met Goal: Respiratory complications will improve Outcome: Completed/Met Goal: Cardiovascular complication will be avoided Outcome: Completed/Met   Problem: Nutrition: Goal: Adequate nutrition will be maintained Outcome: Completed/Met   Problem: Coping: Goal: Level of anxiety will decrease Outcome: Completed/Met   Problem: Elimination: Goal: Will not experience complications related to bowel motility Outcome: Completed/Met Goal: Will not experience complications related to urinary retention Outcome: Completed/Met   Problem: Pain Managment: Goal: General experience of comfort will improve Outcome: Completed/Met   Problem: Safety: Goal: Ability to remain free from injury will improve Outcome: Completed/Met   Problem: Skin Integrity: Goal: Risk for impaired skin integrity will decrease Outcome: Completed/Met  Discharging to SNF for continued rehab.

## 2020-02-07 NOTE — TOC Progression Note (Signed)
Transition of Care [facility]) - Progression Note    Patient Details  Name: Adrian Gillespie MRN: [id number] Date of Birth: 11/18/1950  [facility] CM/SW Contact  Trenton Founds, RN Phone Number: 02/07/2020, 10:52 AM  Clinical Narrative:   RNCM reached out to patient's brother to discuss placement options. He is agreeable to bed offer from [facility]. Did update him that per MD patient's mental status has improved today. He was given Narcan yesterday due to sedation and he improved. RNCM accepted bed in hub and reached out to [place] with facility to see if they can accept patient today.     Expected Discharge Plan: Skilled Nursing Facility Barriers to Discharge: No Barriers Identified  Expected Discharge Plan and Services Expected Discharge Plan: Skilled Nursing Facility In-house Referral: Clinical Social Work   Post Acute Care Choice: Skilled Nursing Facility Living arrangements for the past 2 months: Independent Living Facility                                       Social Determinants of Health (SDOH) Interventions    Readmission Risk Interventions Readmission Risk Prevention Plan 05/25/2019  Transportation Screening Complete  PCP or Specialist Appt within 3-5 Days Complete  HRI or Home Care Consult Complete  Social Work Consult for Recovery Care Planning/Counseling Complete  Palliative Care Screening Not Applicable  Medication Review [employer]) Complete

## 2020-02-07 NOTE — Discharge Summary (Signed)
[place] [facility] [place] NAME: Adrian Gillespie    MR#:  [id number]  DATE OF BIRTH:  08-07-1950  DATE OF ADMISSION:  02/02/2020 ADMITTING PHYSICIAN: Gwynne Edinger, MD  DATE OF DISCHARGE: 02/07/2020  PRIMARY CARE PHYSICIAN: Burnett Kanaris, MD    ADMISSION DIAGNOSIS:  Femur fracture, left ([place]) [S72.92XA] Intractable pain [R52] Closed nondisplaced fracture of greater trochanter of left femur, initial encounter ([place]) [S72.115A]  DISCHARGE DIAGNOSIS:  Principal Problem:   Femur fracture, left (HCC) Active Problems:   Chronic obstructive pulmonary disease (HCC)   HTN (hypertension)   Intractable pain   Eosinophilic asthma   AF (paroxysmal atrial fibrillation) (HCC)   Acquired thrombophilia ([place])   Lethargy   SECONDARY DIAGNOSIS:   Past Medical History:  Diagnosis Date  . Anxiety   . 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 COPD (chronic obstructive pulmonary disease) ([place])   . Depression   . GERD (gastroesophageal reflux disease)   . 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 HLD (hyperlipidemia)   . HTN (hypertension)   . Schizophrenia ([place])     HOSPITAL COURSE:   1.  Left proximal closed femur fracture with history of left hip replacement.  Patient was seen by orthopedic surgery and this is nonoperative management.  Pain control.  Physical therapy as tolerated.  PT recommended rehab.  Patient will be discharged out to rehab today.  The patient does complain about 10 out of 10 pain in his leg.  Need to be careful with pain medications.  Convert to Tylenol as quickly as possible. 2.  Lethargy on 02/06/2020 secondary to pain medications.  I gave 1 dose of Narcan which improved his mental status.  Today he was sitting up in the chair and did better with physical therapy.  I got rid of IV pain medications and decreased oral pain medications.  Try to convert over to Tylenol as quickly as possible.  Prescription for nasal  Narcan just in case. 3.  Paroxysmal atrial fibrillation.  Patient on Eliquis for anticoagulation. 4.  Acquired thrombophilia.  Eliquis to decrease risk of stroke. 5.  COPD on Singulair, Spiriva and Dulera inhaler. 6.  Schizophrenia on benztropine and haloperidol 7.  Tobacco abuse on nicotine patch 8.  Constipation on MiraLAX.  DISCHARGE CONDITIONS:   Satisfactory  CONSULTS OBTAINED:  Treatment Team:  Corky Mull, MD  DRUG ALLERGIES:  No Known Allergies  DISCHARGE MEDICATIONS:   Allergies as of 02/07/2020   No Known Allergies     Medication List    STOP taking these medications   guaiFENesin-dextromethorphan 100-10 MG/5ML syrup Commonly known as: ROBITUSSIN DM     TAKE these medications   acetaminophen 325 MG tablet Commonly known as: TYLENOL Take 2 tablets (650 mg total) by mouth every 6 (six) hours as needed for mild pain or moderate pain.   Advair HFA 45-21 MCG/ACT inhaler Generic drug: fluticasone-salmeterol Inhale 2 puffs into the lungs 2 (two) times daily.   albuterol 108 (90 Base) MCG/ACT inhaler Commonly known as: VENTOLIN HFA Inhale 2 puffs into the lungs every 6 (six) hours as needed for shortness of breath or wheezing.   apixaban 5 MG Tabs tablet Commonly known as: ELIQUIS Take 1 tablet (5 mg total) by mouth 2 (two) times daily.   benzonatate 100 MG capsule Commonly known as: TESSALON Take 1 capsule (100 mg total) by mouth 3 (three) times daily. What changed:   when to  take this  reasons to take this   benztropine 1 MG tablet Commonly known as: COGENTIN Take 1 mg by mouth daily.   Bevespi Aerosphere 9-4.8 MCG/ACT Aero Generic drug: Glycopyrrolate-Formoterol Inhale 2 puffs into the lungs at bedtime.   feeding supplement Liqd Take 237 mLs by mouth 2 (two) times daily between meals.   haloperidol 10 MG tablet Commonly known as: HALDOL Take 10 mg by mouth 2 (two) times daily.   hydrOXYzine 50 MG capsule Commonly known as: VISTARIL Take  50 mg by mouth 2 (two) times daily as needed for anxiety.   Lorayne Bender Sustenna 234 MG/1.5ML Susy injection Generic drug: paliperidone Inject 234 mg into the muscle every 30 (thirty) days.   montelukast 10 MG tablet Commonly known as: SINGULAIR Take 10 mg by mouth daily.   naloxone 4 MG/0.1ML Liqd nasal spray kit Commonly known as: NARCAN One spray in nostril for opioid reversal if unresponsive   nicotine 21 mg/24hr patch Commonly known as: NICODERM CQ - dosed in mg/24 hours Place 1 patch (21 mg total) onto the skin daily.   omeprazole 20 MG capsule Commonly known as: PRILOSEC Take 20 mg by mouth daily.   oxyCODONE 5 MG immediate release tablet Commonly known as: Oxy IR/ROXICODONE Take 1 tablet (5 mg total) by mouth every 8 (eight) hours as needed for severe pain.   polyethylene glycol 17 g packet Commonly known as: MIRALAX / GLYCOLAX Take 17 g by mouth daily.   Spiriva HandiHaler 18 MCG inhalation capsule Generic drug: tiotropium Place 1 capsule (18 mcg total) into inhaler and inhale daily.   traZODone 100 MG tablet Commonly known as: DESYREL Take 300 mg by mouth at bedtime.        DISCHARGE INSTRUCTIONS:   Follow-up doctor at rehab 1 day Follow-up orthopedics 2 weeks  If you experience worsening of your admission symptoms, develop shortness of breath, life threatening emergency, suicidal or homicidal thoughts you must seek medical attention immediately by calling 911 or calling your MD immediately  if symptoms less severe.  You Must read complete instructions/literature along with all the possible adverse reactions/side effects for all the Medicines you take and that have been prescribed to you. Take any new Medicines after you have completely understood and accept all the possible adverse reactions/side effects.   Please note  You were cared for by a hospitalist during your hospital stay. If you have any questions about your discharge medications or the care you  received while you were in the hospital after you are discharged, you can call the unit and asked to speak with the hospitalist on call if the hospitalist that took care of you is not available. Once you are discharged, your primary care physician will handle any further medical issues. Please note that NO REFILLS for any discharge medications will be authorized once you are discharged, as it is imperative that you return to your primary care physician (or establish a relationship with a primary care physician if you do not have one) for your aftercare needs so that they can reassess your need for medications and monitor your lab values.    Today   CHIEF COMPLAINT:   Chief Complaint  Patient presents with  . Fall  . Hip Pain    HISTORY OF PRESENT ILLNESS:  Adrian Gillespie  is a 70 y.o. male had a fall and found to have left femoral neck fracture but history of left hip replacement.   VITAL SIGNS:  Blood pressure (!) 118/95, pulse Marland Kitchen)  108, temperature 98.6 F (37 C), temperature source Oral, resp. rate 18, height _0  (1.753 m), weight 75.4 kg, SpO2 94 %.   PHYSICAL EXAMINATION:  GENERAL:  70 y.o.-year-old patient lying in the bed with no acute distress.  EYES: Pupils equal, round, reactive to light and accommodation. No scleral icterus. HEENT: Head atraumatic, normocephalic. Oropharynx and nasopharynx clear.  LUNGS: Decreased breath sounds bilaterally, no wheezing, rales,rhonchi or crepitation.  CARDIOVASCULAR: S1, S2 normal. No murmurs, rubs, or gallops.  ABDOMEN: Soft, non-tender, non-distended.  EXTREMITIES: No pedal edema.  NEUROLOGIC: Cranial nerves II through XII are intact.  Unable to lift left leg up from the chair today.Marland Kitchen  PSYCHIATRIC: The patient is alert and answers questions appropriately. SKIN: No obvious rash, lesion, or ulcer.   DATA REVIEW:   CBC Recent Labs  Lab 02/05/20 0738  WBC 11.9*  HGB 12.4*  HCT 37.0*  PLT 177    Chemistries  Recent Labs  Lab  02/02/20 0219 02/05/20 0738  NA 135 135  K 3.9 4.1  CL 104 102  CO2 20* 24  GLUCOSE 138* 117*  BUN 7* 10  CREATININE 1.03 0.89  CALCIUM 8.7* 8.8*  AST 20  --   ALT 13  --   ALKPHOS 62  --   BILITOT 1.0  --     Microbiology Results  Results for orders placed or performed during the hospital encounter of 02/02/20  Resp Panel by RT-PCR (Flu A&B, Covid) Nasopharyngeal Swab     Status: None   Collection Time: 02/02/20 11:27 AM   Specimen: Nasopharyngeal Swab; Nasopharyngeal(NP) swabs in vial transport medium  Result Value Ref Range Status   SARS Coronavirus 2 by RT PCR NEGATIVE NEGATIVE Final    Comment: (NOTE) SARS-CoV-2 target nucleic acids are NOT DETECTED.  The SARS-CoV-2 RNA is generally detectable in upper respiratory specimens during the acute phase of infection. The lowest concentration of SARS-CoV-2 viral copies this assay can detect is 138 copies/mL. A negative result does not preclude SARS-Cov-2 infection and should not be used as the sole basis for treatment or other patient management decisions. A negative result may occur with  improper specimen collection/handling, submission of specimen other than nasopharyngeal swab, presence of viral mutation(s) within the areas targeted by this assay, and inadequate number of viral copies(<138 copies/mL). A negative result must be combined with clinical observations, patient history, and epidemiological information. The expected result is Negative.  Fact Sheet for Patients:  EntrepreneurPulse.com.au  Fact Sheet for Healthcare Providers:  IncredibleEmployment.be  This test is no t yet approved or cleared by the [place] FDA and  has been authorized for detection and/or diagnosis of SARS-CoV-2 by FDA under an Emergency Use Authorization (EUA). This EUA will remain  in effect (meaning this test can be used) for the duration of the COVID-19 declaration under Section 564(b)(1) of the  Act, 21 U.S.C.section 360bbb-3(b)(1), unless the authorization is terminated  or revoked sooner.       Influenza A by PCR NEGATIVE NEGATIVE Final   Influenza B by PCR NEGATIVE NEGATIVE Final    Comment: (NOTE) The Xpert Xpress SARS-CoV-2/FLU/RSV plus assay is intended as an aid in the diagnosis of influenza from Nasopharyngeal swab specimens and should not be used as a sole basis for treatment. Nasal washings and aspirates are unacceptable for Xpert Xpress SARS-CoV-2/FLU/RSV testing.  Fact Sheet for Patients: EntrepreneurPulse.com.au  Fact Sheet for Healthcare Providers: IncredibleEmployment.be  This test is not yet approved or cleared by the [place] and  has been authorized for detection and/or diagnosis of SARS-CoV-2 by FDA under an Emergency Use Authorization (EUA). This EUA will remain in effect (meaning this test can be used) for the duration of the COVID-19 declaration under Section 564(b)(1) of the Act, 21 U.S.C. section 360bbb-3(b)(1), unless the authorization is terminated or revoked.  Performed at [facility], [address] [place], [place] [address]   Resp Panel by RT-PCR (Flu A&B, Covid) Nasopharyngeal Swab     Status: None   Collection Time: 02/07/20 12:04 PM   Specimen: Nasopharyngeal Swab; Nasopharyngeal(NP) swabs in vial transport medium  Result Value Ref Range Status   SARS Coronavirus 2 by RT PCR NEGATIVE NEGATIVE Final    Comment: (NOTE) SARS-CoV-2 target nucleic acids are NOT DETECTED.  The SARS-CoV-2 RNA is generally detectable in upper respiratory specimens during the acute phase of infection. The lowest concentration of SARS-CoV-2 viral copies this assay can detect is 138 copies/mL. A negative result does not preclude SARS-Cov-2 infection and should not be used as the sole basis for treatment or other patient management decisions. A negative result may occur with  improper specimen  collection/handling, submission of specimen other than nasopharyngeal swab, presence of viral mutation(s) within the areas targeted by this assay, and inadequate number of viral copies(<138 copies/mL). A negative result must be combined with clinical observations, patient history, and epidemiological information. The expected result is Negative.  Fact Sheet for Patients:  EntrepreneurPulse.com.au  Fact Sheet for Healthcare Providers:  IncredibleEmployment.be  This test is no t yet approved or cleared by the [place] FDA and  has been authorized for detection and/or diagnosis of SARS-CoV-2 by FDA under an Emergency Use Authorization (EUA). This EUA will remain  in effect (meaning this test can be used) for the duration of the COVID-19 declaration under Section 564(b)(1) of the Act, 21 U.S.C.section 360bbb-3(b)(1), unless the authorization is terminated  or revoked sooner.       Influenza A by PCR NEGATIVE NEGATIVE Final   Influenza B by PCR NEGATIVE NEGATIVE Final    Comment: (NOTE) The Xpert Xpress SARS-CoV-2/FLU/RSV plus assay is intended as an aid in the diagnosis of influenza from Nasopharyngeal swab specimens and should not be used as a sole basis for treatment. Nasal washings and aspirates are unacceptable for Xpert Xpress SARS-CoV-2/FLU/RSV testing.  Fact Sheet for Patients: EntrepreneurPulse.com.au  Fact Sheet for Healthcare Providers: IncredibleEmployment.be  This test is not yet approved or cleared by the [place] FDA and has been authorized for detection and/or diagnosis of SARS-CoV-2 by FDA under an Emergency Use Authorization (EUA). This EUA will remain in effect (meaning this test can be used) for the duration of the COVID-19 declaration under Section 564(b)(1) of the Act, 21 U.S.C. section 360bbb-3(b)(1), unless the authorization is terminated or revoked.  Performed at [facility], [address] [place], [place] [address]      Management plans discussed with the patient, and he is agreement.  Left message for brother on the phone.  Transitional care team did speak with him earlier.  CODE STATUS:     Code Status Orders  (From admission, onward)         Start     Ordered   02/02/20 1301  Full code  Continuous        02/02/20 1300        Code Status History    Date Active Date Inactive Code Status Order ID Comments User Context   05/24/2019 0155 05/26/2019 2134 Full  Code [id number]  Sidney Ace Arvella Merles, MD ED   04/20/2019 1027 04/23/2019 2152 Full Code [id number]  Ivor Costa, MD ED   04/02/2019 1511 04/05/2019 2202 Full Code [id number]  Collier Bullock, MD ED   Advance Care Planning Activity      TOTAL TIME TAKING CARE OF THIS PATIENT: 35 minutes.    Loletha Grayer M.D on 02/07/2020 at 2:22 PM  Between 7am to 6pm - Pager - [phone]  After 6pm go to [email or web address] - password [email or web address]  [facility]  CC: Primary care physician; Burnett Kanaris, MD

## 2020-07-25 ENCOUNTER — Encounter: Admit: 2020-07-25 | Discharge: 2020-07-25 | Payer: MEDICARE

## 2020-07-25 MED ORDER — TRIAMCINOLONE ACETONIDE 0.5 % TP CREA
Freq: Three times a day (TID) | 0 refills | Status: AC | PRN
Start: 2020-07-25 — End: ?

## 2020-07-25 MED ORDER — PREDNISONE 10 MG PO TAB
ORAL_TABLET | 0 refills | Status: AC
Start: 2020-07-25 — End: ?

## 2020-07-26 ENCOUNTER — Ambulatory Visit: Admit: 2020-07-26 | Discharge: 2020-07-26 | Payer: MEDICARE

## 2020-07-26 NOTE — Patient Instructions
Managing a Poison Ivy, Poison Oak, or Poison Sumac Reaction  If you come in contact with urushiol  Sap oil (urushiol) is contained in poison ivy, poison oak, or poison sumac plants. If you think you may have come in contact with it, it's important to wash the affected part of your skin as soon as possible. This is to remove the sap oil or help prevent further spread.  Wash the affected areas with degreasing soap (such as dishwashing soap) or rubbing alcohol and lots of cool water as soon as you come into contact with a poisonous plant.  Undress, and wash your clothes and gear as soon as you can.  Be sure to wash any pet that was with you.  Taking these steps can help prevent spreading sap oil to someone else. The rash is caused by the sap oil. Once you have washed yourself and your clothing, the rash itself can't be spread to others. If you have a rash, but aren't sure if it's from one of these plants, see your healthcare provider.    To soothe the itching  Your skin may react to poison oak, poison ivy, and poison sumac within hours to a few days after contact. Listed below are some common home care treatments. If these aren't effective in easing symptoms, call your healthcare provider. They may advise other treatments. These may include prescription medicines.  Don't scratch or scrub your rash. Not even if the itching is severe. Scratching can lead to infection.  Bathe in lukewarm (not hot) water. Or take short cool showers to ease the itching. For a more soothing bath, add oatmeal to the water.  Use antihistamines that are taken by mouth. These include diphenhydramine. You can buy these at the grocery store or pharmacy. Talk to your healthcare provider or pharmacist for more information on antihistamines taken by mouth (oral). Antihistamines may make you sleepy or slow to react. So it's best not to take these during the day or when driving.  Use over-the-counter treatments on your skin. These include cortisone  creams and calamine lotion.  How your skin may react  A mild rash may become red, swollen, and itchy. The rash may form a line on your skin where you brushed against the plant. If you have a severe rash, your itching may get worse. Or your rash may blister and ooze. If this happens, get medical care. The fluid from your blisters won't make your rash spread. With or without medical care, your rash may last up to 3 weeks. In the future, try not to come in contact with these plants.  When to call your healthcare provider  Call your healthcare provider right away if any of these occur:  Your rash is severe  The rash spreads beyond the exposed part of your body or affects your face or genitals.  The rash doesn't clear up in a few weeks  Call 911  Call 911 if you have any of the following:  Trouble breathing or swallowing  Any major swelling  StayWell last reviewed this educational content on 10/28/2019     2000-2022 [employer]Well [employer]. All rights reserved. This information is not intended as a substitute for professional medical care. Always follow your healthcare professional's instructions.          Avoiding Poison Ivy, Poison Oak, and Poison Sumac  Poison oak, poison ivy, and poison sumac are plants that can cause skin rashes. The problem is a sap oil called urushiol that   is contained in these plants. If you're allergic to urushiol--and most people are--touching one of these plants may cause your skin to react. Within hours or days, you may have a red, swollen, itchy rash.  Recognizing these plants  You can help prevent a poison oak, poison ivy, or poison sumac rash. Know what these plants look like. And then stay away from them. They grow in the form of vines, small plants, and large bushes. In most cases, poison oak and poison ivy have 3 leaves per stem. Poison sumac has from 7 leaves to 13 leaves per stem. Watch out for these plants when you go to any outdoor area, from a friend's overgrown back yard to the  wilderness. Urushiol is present in these plants all year round, even when the leaves are gone. So always be on the lookout.     Poison ivy.      Poison oak.      Poison sumac.     What causes a reaction?  Poison oak, poison ivy, and poison sumac thrive mainly in unmaintained outdoor areas. If you touch these plants, you may get a rash. You may also react if you touch something that came in contact with urushiol such as a dog or cat, clothing, or equipment. The rash caused by these plants is not contagious.  Steps to prevention  When heading outdoors, take these preventive steps:  Don't touch any of these plants.  Wear long pants and a long-sleeved shirt.  If you're going to a heavily wooded or brushy area, also put on gloves, a hat, and boots.  If you are very sensitive, apply bentoquatam 5% topical cream to all exposed areas of your skin. This creates a layer of protection between your skin and any sap oil you may touch.  If you come in contact with these plants or the oil, wash with soap and water as soon as possible.  Wash clothing and animals that come in contact with these plants as well. Urushiol may stay on them and cause a rash when you touch them in the future.  StayWell last reviewed this educational content on 06/28/2018     2000-2022 The [employer]Well [employer]. All rights reserved. This information is not intended as a substitute for professional medical care. Always follow your healthcare professional's instructions.

## 2020-11-21 IMAGING — DX DG CHEST 1V PORT
1 series · 1 of 1 positions shown · non-contrast
Comparison: 04/20/2019

CLINICAL DATA: Shortness of breath and cough

EXAM:
PORTABLE CHEST 1 VIEW

[chest ap]
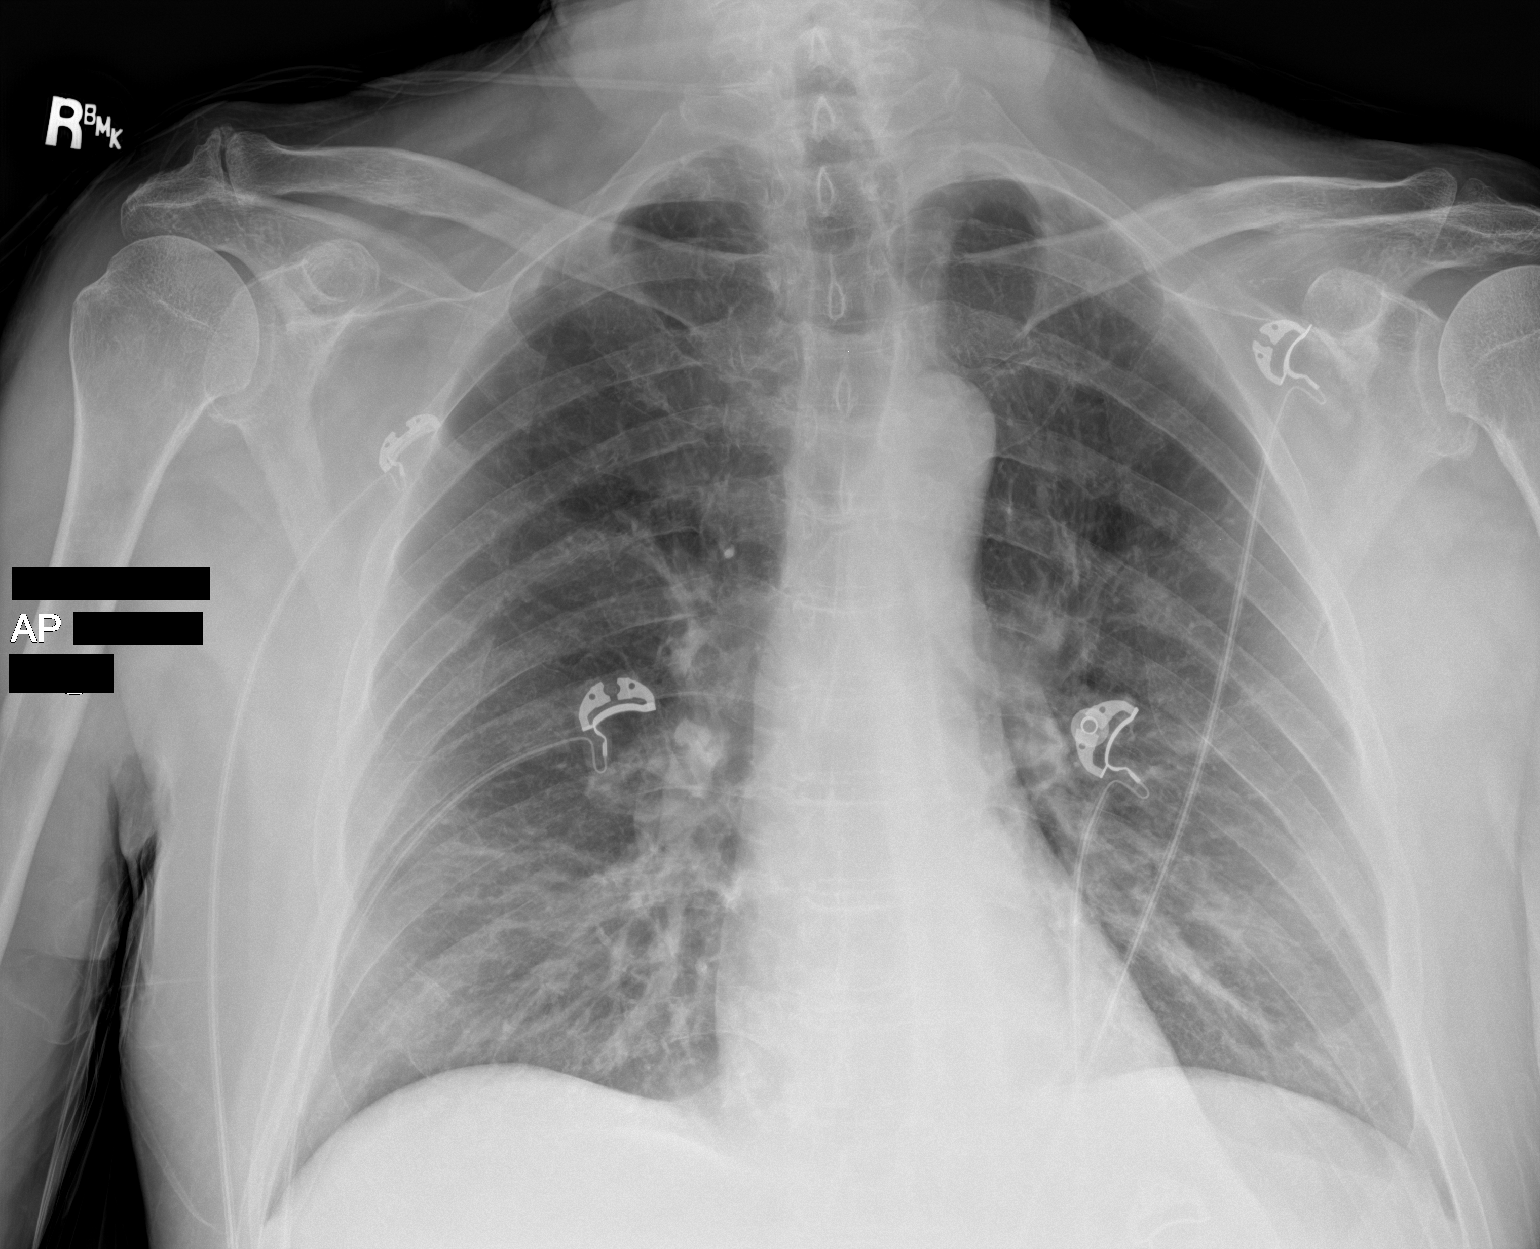

[1 of 1 positions shown; findings below may reference images not displayed]

FINDINGS: Cardiac shadow is within normal limits. The lungs are well aerated
bilaterally. No focal infiltrate or sizable effusion is seen. No
bony abnormality is noted.
IMPRESSION: No acute abnormality noted.

## 2021-08-02 IMAGING — CR DG HIP (WITH OR WITHOUT PELVIS) 2-3V*L*
1 series · 3 of 3 positions shown · non-contrast
Comparison: None.

CLINICAL DATA: Status post fall.

EXAM:
DG HIP (WITH OR WITHOUT PELVIS) 2-3V LEFT

[Series 1: dg hip unilat w or w/o pelvis 2-3 views  · non-contrast · 0.14mm/px · 3 of 3 slices shown]
[im 1/3]
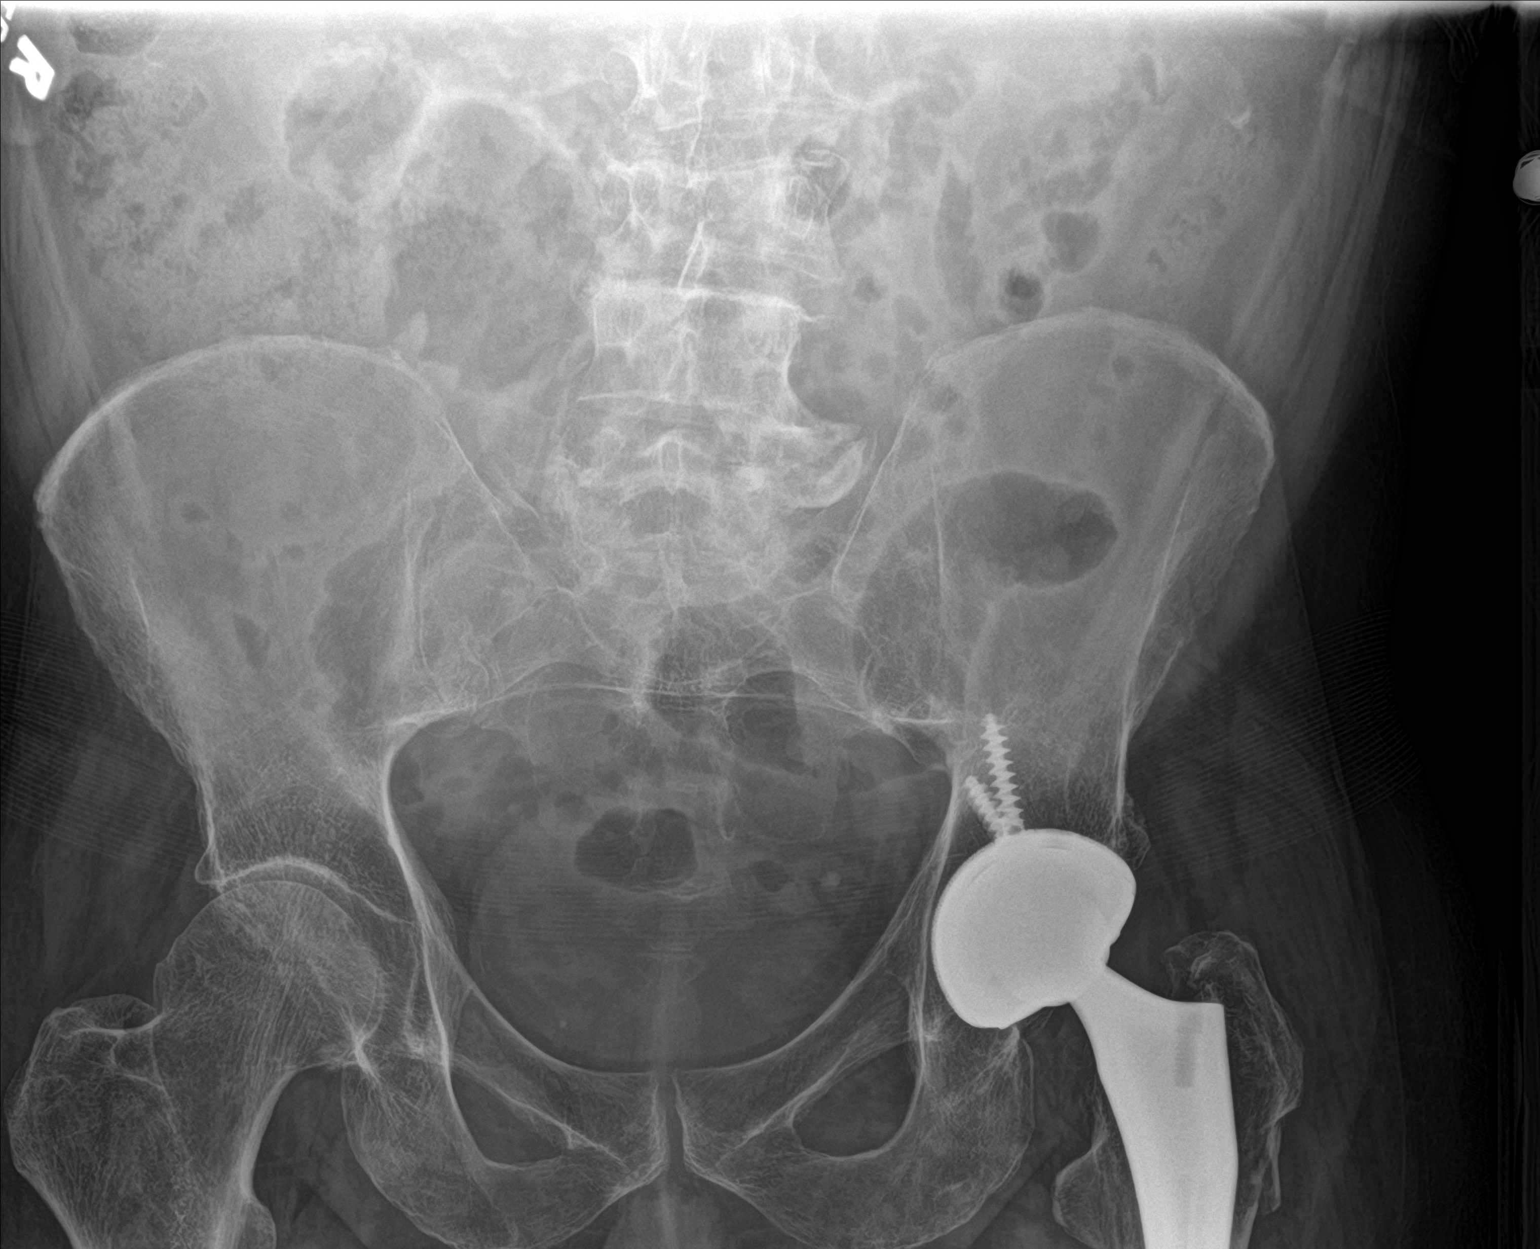
[im 2/3]
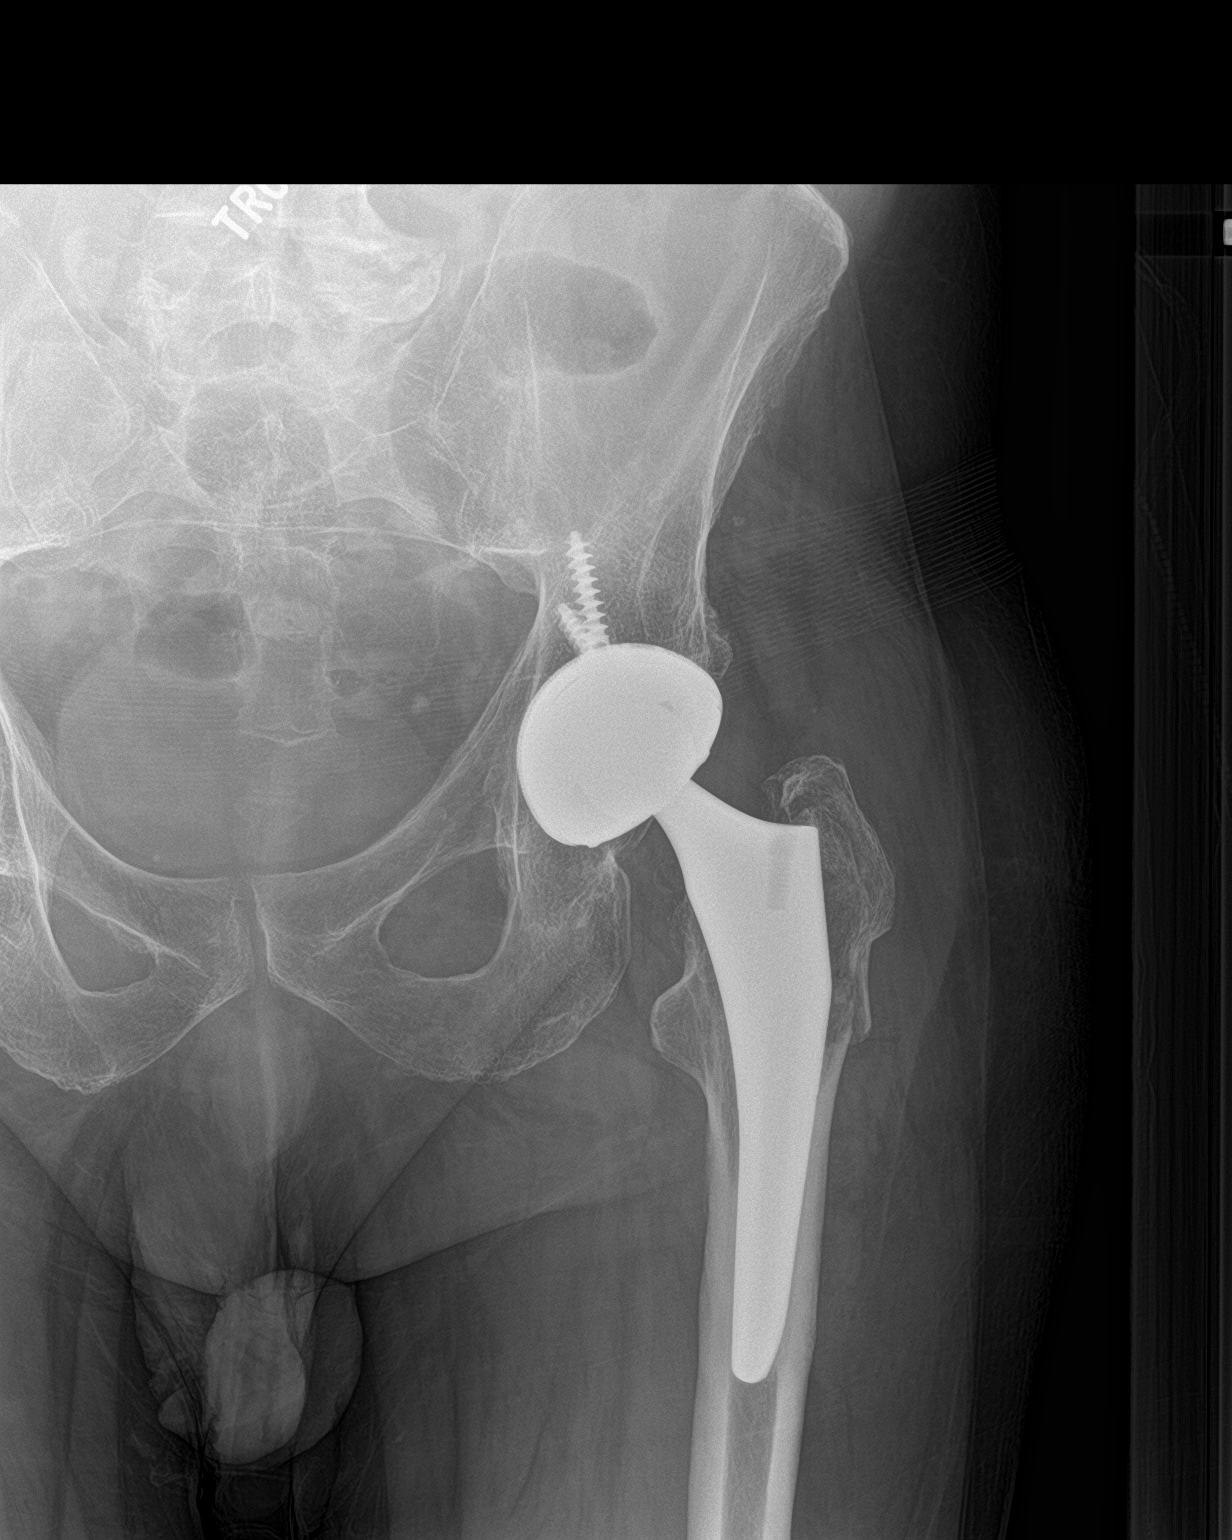
[im 3/3]
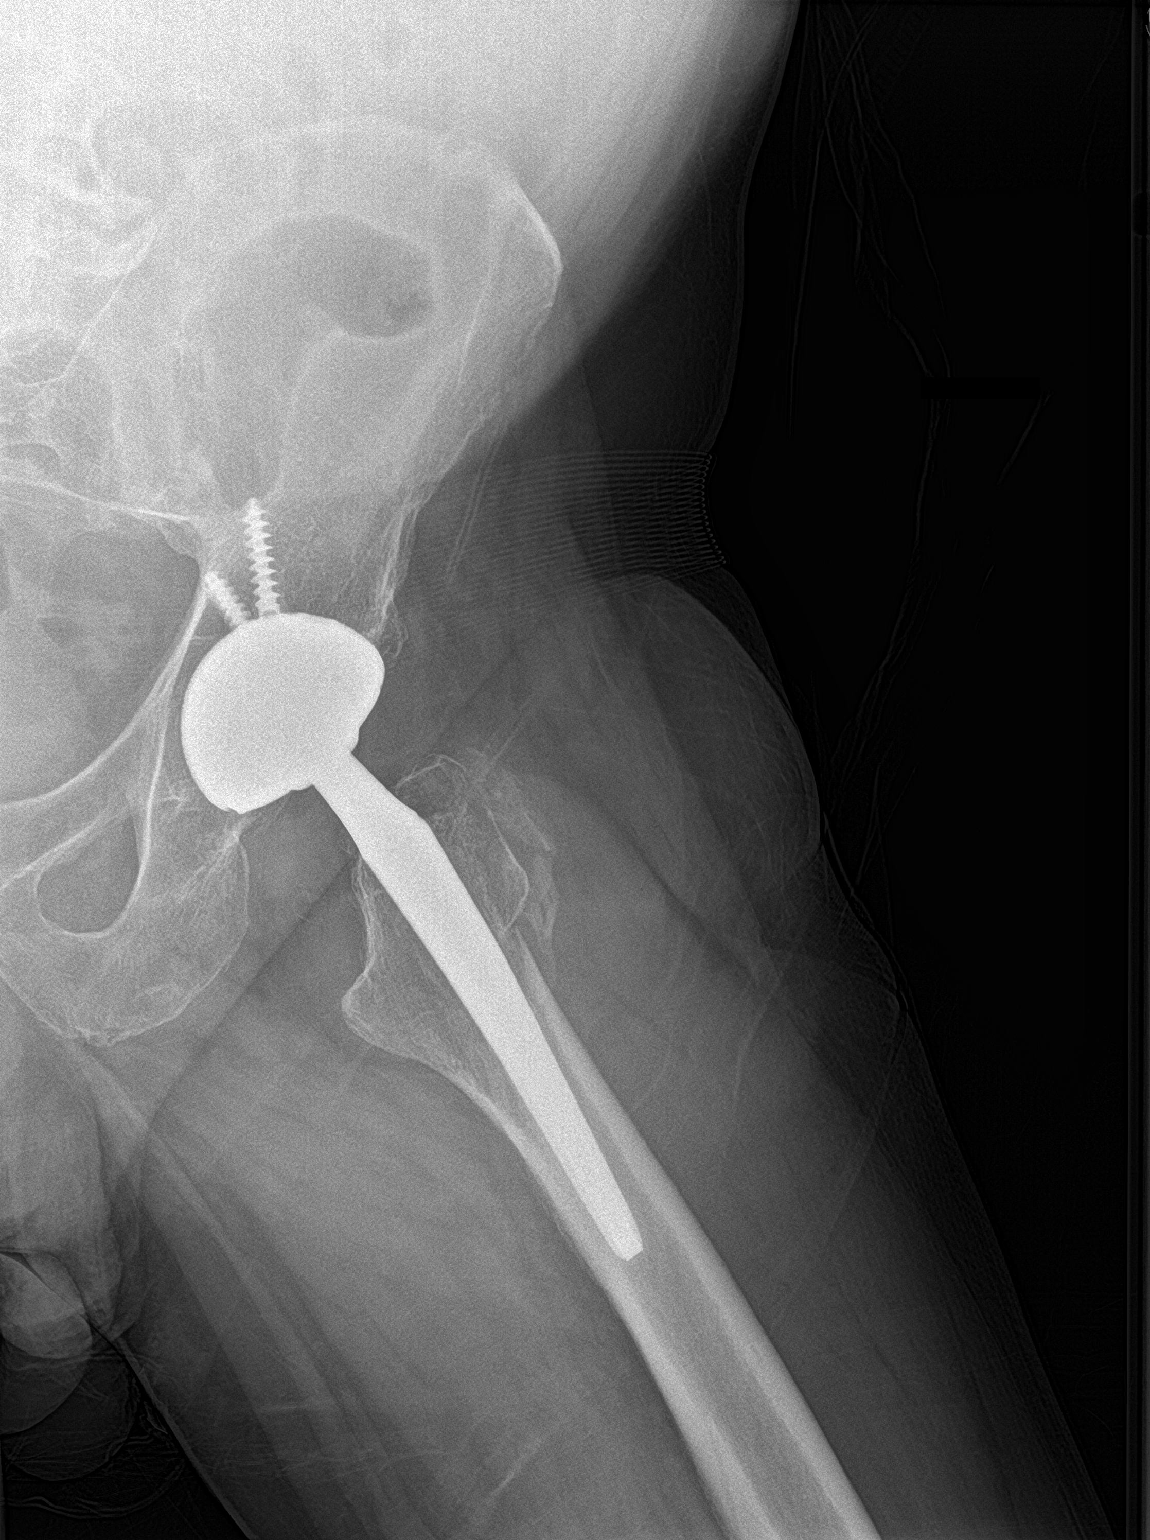

[3 of 3 positions shown; findings below may reference images not displayed]

FINDINGS: A total left hip replacement is seen. There is no evidence of
surrounding lucency to suggest the presence of hardware loosening or
infection. Acute, mildly displaced fracture deformity is seen
involving the lateral aspect of the proximal left femoral shaft.
This is adjacent to the distal aspect of the left greater
trochanter. There is no evidence of dislocation. Mild soft tissue
swelling is seen along the lateral aspect of the left hip.
IMPRESSION: 1. Acute fracture of the proximal left femur.
2. Left hip replacement without evidence of hardware loosening or
infection.

## 2021-08-03 IMAGING — CR DG CHEST 1V PORT
1 series · 1 of 1 positions shown · non-contrast
Comparison: 05/23/2019

CLINICAL DATA: Left hip fracture, preoperative examination

EXAM:
PORTABLE CHEST 1 VIEW

[dg chest port 1 view]
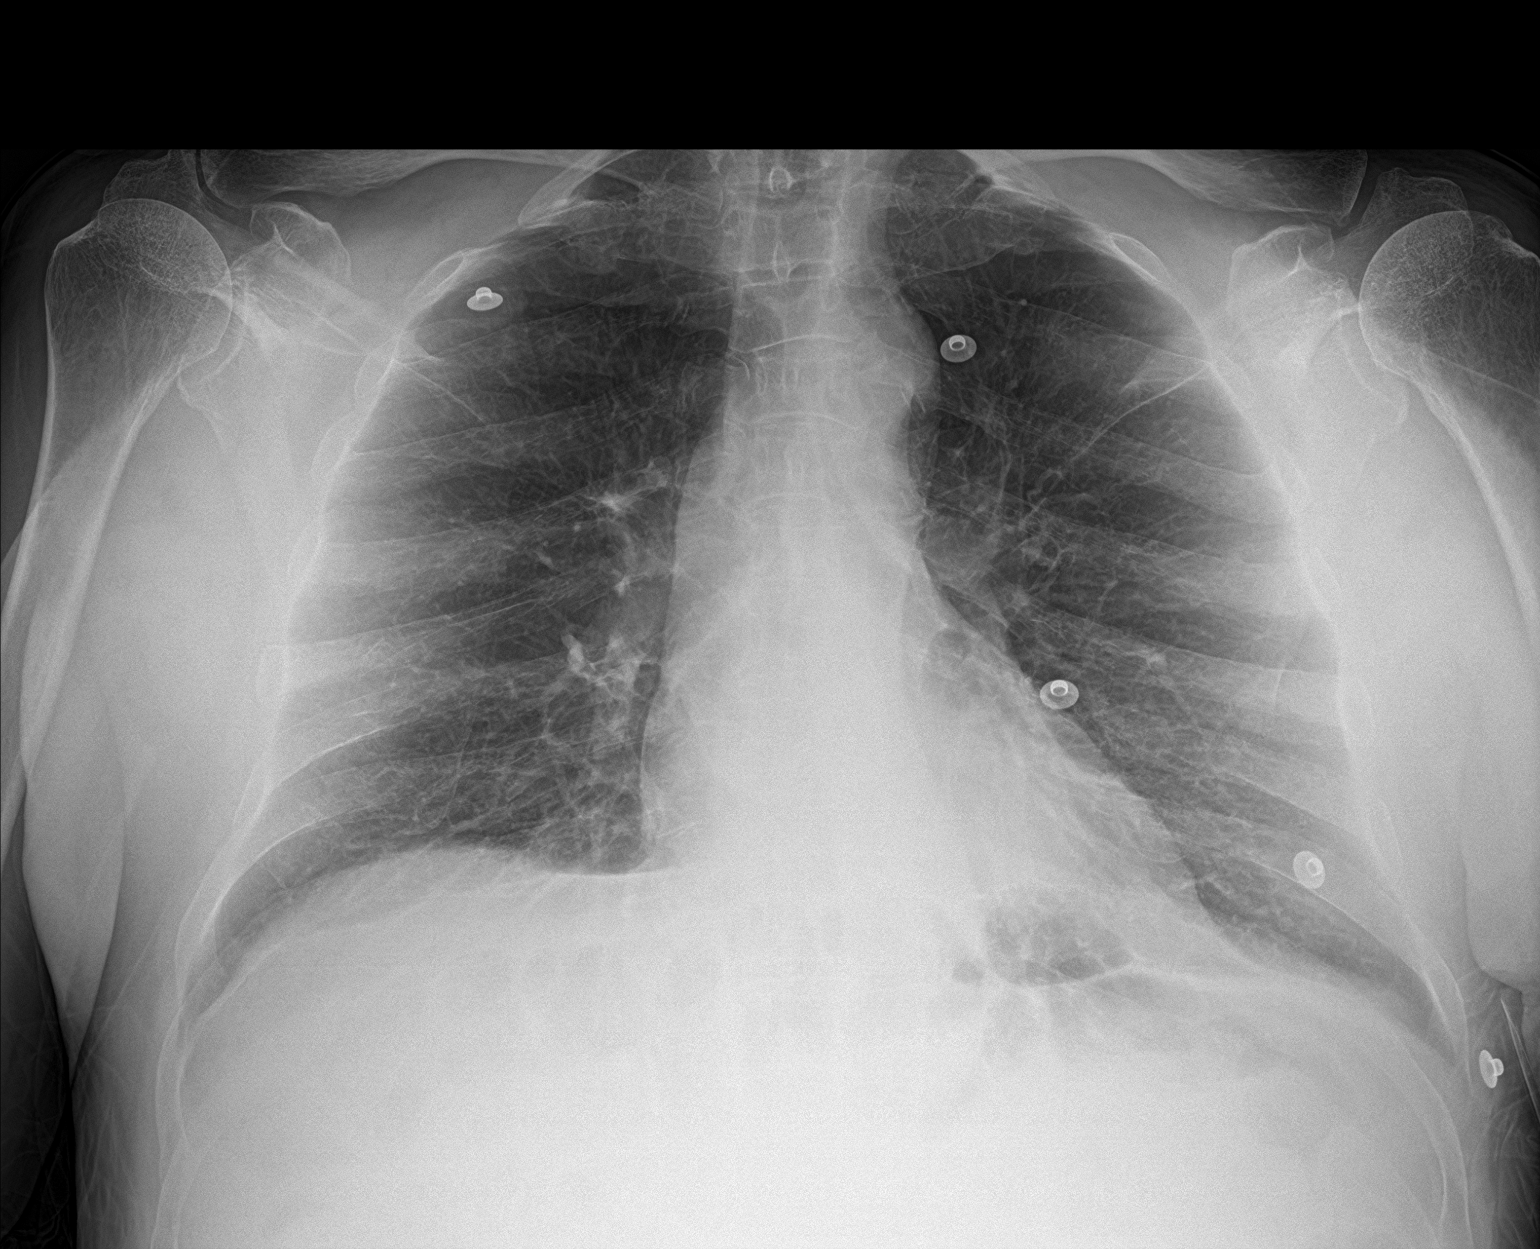

[1 of 1 positions shown; findings below may reference images not displayed]

FINDINGS: The heart size and mediastinal contours are within normal limits.
Both lungs are clear. The visualized skeletal structures are
unremarkable.
IMPRESSION: No active disease.

## 2022-06-19 ENCOUNTER — Encounter: Payer: Self-pay | Admitting: Podiatry

## 2022-06-19 ENCOUNTER — Ambulatory Visit: Payer: Medicare Other | Admitting: Podiatry

## 2022-06-19 VITALS — BP 113/81 | HR 82

## 2022-06-19 DIAGNOSIS — M79674 Pain in right toe(s): Secondary | ICD-10-CM | POA: Diagnosis not present

## 2022-06-19 DIAGNOSIS — M79675 Pain in left toe(s): Secondary | ICD-10-CM

## 2022-06-19 DIAGNOSIS — B351 Tinea unguium: Secondary | ICD-10-CM

## 2022-06-19 NOTE — Progress Notes (Signed)
  Subjective:  Patient ID: Adrian Gillespie, male    DOB: 02-26-1950,  MRN: [id number]  Chief Complaint  Patient presents with   Nail Problem    "He has an ingrown toe on the right big toe."   72 y.o. male returns for the above complaint.  Patient presents with thickened elongated dystrophic mycotic toenails x 10 mild pain on palpation hurts with ambulation worse with pressure.  She would like to he would like for me to debride down is not able to do it himself  Objective:   Vitals:   06/19/22 1450  BP: 113/81  Pulse: 82   Podiatric Exam: Vascular: dorsalis pedis and posterior tibial pulses are palpable bilateral. Capillary return is immediate. Temperature gradient is WNL. Skin turgor WNL  Sensorium: Normal Semmes Weinstein monofilament test. Normal tactile sensation bilaterally. Nail Exam: Pt has thick disfigured discolored nails with subungual debris noted bilateral entire nail hallux through fifth toenails.  Pain on palpation to the nails. Ulcer Exam: There is no evidence of ulcer or pre-ulcerative changes or infection. Orthopedic Exam: Muscle tone and strength are WNL. No limitations in general ROM. No crepitus or effusions noted.  Skin: No Porokeratosis. No infection or ulcers    Assessment & Plan:   1. Pain due to onychomycosis of toenails of both feet     Patient was evaluated and treated and all questions answered.  Onychomycosis with pain  -Nails palliatively debrided as below. -Educated on self-care  Procedure: Nail Debridement Rationale: pain  Type of Debridement: manual, sharp debridement. Instrumentation: Nail nipper, rotary burr. Number of Nails: 10  Procedures and Treatment: Consent by patient was obtained for treatment procedures. The patient understood the discussion of treatment and procedures well. All questions were answered thoroughly reviewed. Debridement of mycotic and hypertrophic toenails, 1 through 5 bilateral and clearing of subungual debris. No  ulceration, no infection noted.  Return Visit-Office Procedure: Patient instructed to return to the office for a follow up visit 3 months for continued evaluation and treatment.  Nicholes Rough, DPM    No follow-ups on file.

## 2022-07-03 ENCOUNTER — Encounter: Payer: Self-pay | Admitting: Podiatry

## 2022-08-07 ENCOUNTER — Ambulatory Visit: Payer: Medicare Other | Admitting: Podiatry

## 2022-08-07 DIAGNOSIS — B351 Tinea unguium: Secondary | ICD-10-CM | POA: Diagnosis not present

## 2022-08-07 DIAGNOSIS — M79674 Pain in right toe(s): Secondary | ICD-10-CM

## 2022-08-07 DIAGNOSIS — M79675 Pain in left toe(s): Secondary | ICD-10-CM

## 2022-08-07 NOTE — Progress Notes (Signed)
  Subjective:  Patient ID: Adrian Gillespie, male    DOB: 07-03-1950,  MRN: [id number]  Chief Complaint  Patient presents with   Nail Problem    Routine foot care, nail trim    Ingrown Toenail    Right hallux medial border    72 y.o. male returns for the above complaint.  Patient presents with thickened elongated dystrophic mycotic toenails x 10 mild pain on palpation hurts with ambulation worse with pressure.  She would like to he would like for me to debride down is not able to do it himself  Objective:   There were no vitals filed for this visit.  Podiatric Exam: Vascular: dorsalis pedis and posterior tibial pulses are palpable bilateral. Capillary return is immediate. Temperature gradient is WNL. Skin turgor WNL  Sensorium: Normal Semmes Weinstein monofilament test. Normal tactile sensation bilaterally. Nail Exam: Pt has thick disfigured discolored nails with subungual debris noted bilateral entire nail hallux through fifth toenails.  Pain on palpation to the nails. Ulcer Exam: There is no evidence of ulcer or pre-ulcerative changes or infection. Orthopedic Exam: Muscle tone and strength are WNL. No limitations in general ROM. No crepitus or effusions noted.  Skin: No Porokeratosis. No infection or ulcers    Assessment & Plan:   No diagnosis found.   Patient was evaluated and treated and all questions answered.  Onychomycosis with pain  -Nails palliatively debrided as below. -Educated on self-care  Procedure: Nail Debridement Rationale: pain  Type of Debridement: manual, sharp debridement. Instrumentation: Nail nipper, rotary burr. Number of Nails: 10  Procedures and Treatment: Consent by patient was obtained for treatment procedures. The patient understood the discussion of treatment and procedures well. All questions were answered thoroughly reviewed. Debridement of mycotic and hypertrophic toenails, 1 through 5 bilateral and clearing of subungual debris. No ulceration, no  infection noted.  Return Visit-Office Procedure: Patient instructed to return to the office for a follow up visit 3 months for continued evaluation and treatment.  Nicholes Rough, DPM    No follow-ups on file.

## 2022-08-22 ENCOUNTER — Emergency Department: Payer: Medicare Other

## 2022-08-22 ENCOUNTER — Encounter: Payer: Self-pay | Admitting: Radiology

## 2022-08-22 ENCOUNTER — Other Ambulatory Visit: Payer: Self-pay

## 2022-08-22 ENCOUNTER — Emergency Department
Admission: EM | Admit: 2022-08-22 | Discharge: 2022-08-22 | Disposition: A | Discharge status: Home / Self Care | Payer: Medicare Other | Attending: Emergency Medicine | Admitting: Emergency Medicine

## 2022-08-22 DIAGNOSIS — W182XXA Fall in (into) shower or empty bathtub, initial encounter: Secondary | ICD-10-CM | POA: Insufficient documentation

## 2022-08-22 DIAGNOSIS — S7001XA Contusion of right hip, initial encounter: Secondary | ICD-10-CM | POA: Diagnosis not present

## 2022-08-22 DIAGNOSIS — S0990XA Unspecified injury of head, initial encounter: Secondary | ICD-10-CM | POA: Diagnosis not present

## 2022-08-22 DIAGNOSIS — Z7951 Long term (current) use of inhaled steroids: Secondary | ICD-10-CM | POA: Diagnosis not present

## 2022-08-22 DIAGNOSIS — S20211A Contusion of right front wall of thorax, initial encounter: Secondary | ICD-10-CM | POA: Insufficient documentation

## 2022-08-22 DIAGNOSIS — Z96641 Presence of right artificial hip joint: Secondary | ICD-10-CM | POA: Diagnosis not present

## 2022-08-22 DIAGNOSIS — I10 Essential (primary) hypertension: Secondary | ICD-10-CM | POA: Diagnosis not present

## 2022-08-22 DIAGNOSIS — Z7901 Long term (current) use of anticoagulants: Secondary | ICD-10-CM | POA: Insufficient documentation

## 2022-08-22 DIAGNOSIS — J449 Chronic obstructive pulmonary disease, unspecified: Secondary | ICD-10-CM | POA: Diagnosis not present

## 2022-08-22 DIAGNOSIS — S299XXA Unspecified injury of thorax, initial encounter: Secondary | ICD-10-CM | POA: Diagnosis present

## 2022-08-22 LAB — BASIC METABOLIC PANEL
Anion gap: 11 (ref 5–15)
BUN: 10 mg/dL (ref 8–23)
CO2: 19 mmol/L — ABNORMAL LOW (ref 22–32)
Calcium: 8.4 mg/dL — ABNORMAL LOW (ref 8.9–10.3)
Chloride: 102 mmol/L (ref 98–111)
Creatinine, Ser: 1.05 mg/dL (ref 0.61–1.24)
GFR, Estimated: >60 mL/min (ref >60)
Glucose, Bld: 129 mg/dL — ABNORMAL HIGH (ref 70–99)
Potassium: 3.7 mmol/L (ref 3.5–5.1)
Sodium: 132 mmol/L — ABNORMAL LOW (ref 135–145)

## 2022-08-22 LAB — CBC WITH DIFFERENTIAL/PLATELET
Abs Immature Granulocytes: 0.06 10*3/uL (ref 0.00–0.07)
Basophils Absolute: 0.1 10*3/uL (ref 0.0–0.1)
Basophils Relative: 1 %
Eosinophils Absolute: 0.3 10*3/uL (ref 0.0–0.5)
Eosinophils Relative: 2 %
HCT: 38.5 % — ABNORMAL LOW (ref 39.0–52.0)
Hemoglobin: 12.3 g/dL — ABNORMAL LOW (ref 13.0–17.0)
Immature Granulocytes: 1 %
Lymphocytes Relative: 20 %
Lymphs Abs: 2.1 10*3/uL (ref 0.7–4.0)
MCH: 23 pg — ABNORMAL LOW (ref 26.0–34.0)
MCHC: 31.9 g/dL (ref 30.0–36.0)
MCV: 72 fL — ABNORMAL LOW (ref 80.0–100.0)
Monocytes Absolute: 1.1 10*3/uL — ABNORMAL HIGH (ref 0.1–1.0)
Monocytes Relative: 10 %
Neutro Abs: 7.3 10*3/uL (ref 1.7–7.7)
Neutrophils Relative %: 66 %
Platelets: 418 10*3/uL — ABNORMAL HIGH (ref 150–400)
RBC: 5.35 MIL/uL (ref 4.22–5.81)
RDW: 17.2 % — ABNORMAL HIGH (ref 11.5–15.5)
WBC: 10.9 10*3/uL — ABNORMAL HIGH (ref 4.0–10.5)
nRBC: 0 % (ref 0.0–0.2)

## 2022-08-22 NOTE — Discharge Instructions (Addendum)
Please take Tylenol 1000 mg every 8 hours as needed for pain.  Return to the ER for any worsening symptoms or any urgent changes in your health such as vision changes severe headache, difficulty breathing chest pain or shortness of breath

## 2022-08-22 NOTE — ED Provider Notes (Signed)
EMERGENCY DEPARTMENT AT [facility] Provider Note   CSN: [id number] Arrival date & time: 08/22/22  1803     History  Chief Complaint  Patient presents with   Adrian Gillespie is a 72 y.o. male with past medical history of acute respiratory failure, hypokalemia, schizophrenia, COPD, hypertension, right hip replacement presents to the emergency department evaluation of fall.  This morning he slipped in the shower fell on his right side, hit the right side of his head and complains of right rib pain, right hip pain.  He has been able to walk, close to baseline.  He suffers some from unsteadiness.  No LOC, nausea or vomiting.  No vision changes.  Patient has soreness to the right forehead but no swelling or bruising.  HPI     Home Medications Prior to Admission medications   Medication Sig Start Date End Date Taking? Authorizing Provider  acetaminophen (TYLENOL) 325 MG tablet Take 2 tablets (650 mg total) by mouth every 6 (six) hours as needed for mild pain or moderate pain. 02/07/20   Alford Highland, MD  ADVAIR HFA [phone] MCG/ACT inhaler Inhale 2 puffs into the lungs 2 (two) times daily. 01/10/20   [provider]  albuterol (VENTOLIN HFA) 108 (90 Base) MCG/ACT inhaler Inhale 2 puffs into the lungs every 6 (six) hours as needed for shortness of breath or wheezing. 04/05/19   Dhungel, Theda Belfast, MD  apixaban (ELIQUIS) 5 MG TABS tablet Take 1 tablet (5 mg total) by mouth 2 (two) times daily. 04/23/19   Rolly Salter, MD  benzonatate (TESSALON) 100 MG capsule Take 1 capsule (100 mg total) by mouth 3 (three) times daily. Patient taking differently: Take 100 mg by mouth 3 (three) times daily as needed. 04/23/19   Rolly Salter, MD  benztropine (COGENTIN) 1 MG tablet Take 1 mg by mouth daily.    [provider]  BEVESPI AEROSPHERE 9-4.8 MCG/ACT AERO Inhale 2 puffs into the lungs at bedtime.  03/31/19   [provider]  feeding supplement, ENSURE  ENLIVE, (ENSURE ENLIVE) LIQD Take 237 mLs by mouth 2 (two) times daily between meals. 05/26/19   Dhungel, Theda Belfast, MD  haloperidol (HALDOL) 10 MG tablet Take 10 mg by mouth 2 (two) times daily.    [provider]  hydrOXYzine (VISTARIL) 50 MG capsule Take 50 mg by mouth 2 (two) times daily as needed for anxiety.  04/14/19   [provider]  INVEGA SUSTENNA 234 MG/1.5ML SUSY injection Inject 234 mg into the muscle every 30 (thirty) days. 04/18/19   [provider]  montelukast (SINGULAIR) 10 MG tablet Take 10 mg by mouth daily. 11/25/19   [provider]  naloxone [facility]) nasal spray 4 mg/0.1 mL One spray in nostril for opioid reversal if unresponsive 02/07/20   Alford Highland, MD  nicotine (NICODERM CQ - DOSED IN MG/24 HOURS) 21 mg/24hr patch Place 1 patch (21 mg total) onto the skin daily. 05/26/19   Dhungel, Theda Belfast, MD  omeprazole (PRILOSEC) 20 MG capsule Take 20 mg by mouth daily.    [provider]  oxyCODONE (OXY IR/ROXICODONE) 5 MG immediate release tablet Take 1 tablet (5 mg total) by mouth every 8 (eight) hours as needed for severe pain. 02/07/20   Wieting, Richard, MD  polyethylene glycol (MIRALAX / GLYCOLAX) 17 g packet Take 17 g by mouth daily. 02/07/20   Alford Highland, MD  tiotropium (SPIRIVA HANDIHALER) 18 MCG inhalation capsule Place 1 capsule (18 mcg total)  into inhaler and inhale daily. 04/05/19   Dhungel, Theda Belfast, MD  traZODone (DESYREL) 100 MG tablet Take 300 mg by mouth at bedtime.    [provider]      Allergies    Patient has no known allergies.    Review of Systems   Review of Systems  Physical Exam Updated Vital Signs BP 113/87 (BP Location: Right Arm)   Pulse 95   Temp 98.5 F (36.9 C) (Oral)   Resp 18   Ht 5\' 9"  (1.753 m)   Wt 75.3 kg   SpO2 94%   BMI 24.51 kg/m  Physical Exam Constitutional:      Appearance: He is well-developed.  HENT:     Head: Normocephalic and atraumatic.     Right Ear: External  ear normal.     Left Ear: External ear normal.     Mouth/Throat:     Mouth: Mucous membranes are moist.  Eyes:     Conjunctiva/sclera: Conjunctivae normal.  Cardiovascular:     Rate and Rhythm: Normal rate and regular rhythm.     Pulses: Normal pulses.     Heart sounds: Normal heart sounds.  Pulmonary:     Effort: Pulmonary effort is normal. No respiratory distress.     Breath sounds: Normal breath sounds. No stridor. No rhonchi.  Abdominal:     General: There is no distension.     Tenderness: There is no abdominal tenderness. There is no right CVA tenderness, left CVA tenderness or guarding.  Musculoskeletal:        General: Normal range of motion.     Cervical back: Normal range of motion.     Comments: No cervical thoracic or lumbar spinous process tenderness.  He is tender along the right ribs but nontender along the sternum or left ribs.  He has full range of motion of both hips with no discomfort.  He is tender along the right hip trochanteric bursa.  No tenderness throughout the right knee.  Skin:    General: Skin is warm.     Findings: No rash.  Neurological:     Mental Status: He is alert and oriented to person, place, and time.  Psychiatric:        Behavior: Behavior normal.        Thought Content: Thought content normal.     ED Results / Procedures / Treatments   Labs (all labs ordered are listed, but only abnormal results are displayed) Labs Reviewed  CBC WITH DIFFERENTIAL/PLATELET - Abnormal; Notable for the following components:      Result Value   WBC 10.9 (*)    Hemoglobin 12.3 (*)    HCT 38.5 (*)    MCV 72.0 (*)    MCH 23.0 (*)    RDW 17.2 (*)    Platelets 418 (*)    Monocytes Absolute 1.1 (*)    All other components within normal limits  BASIC METABOLIC PANEL - Abnormal; Notable for the following components:   Sodium 132 (*)    CO2 19 (*)    Glucose, Bld 129 (*)    Calcium 8.4 (*)    All other components within normal limits     EKG None  Radiology CT Head Wo Contrast  Result Date: 08/22/2022 CLINICAL DATA:  Recent fall with headaches and neck pain, initial encounter EXAM: CT HEAD WITHOUT CONTRAST CT CERVICAL SPINE WITHOUT CONTRAST TECHNIQUE: Multidetector CT imaging of the head and cervical spine was performed following the standard protocol without intravenous  contrast. Multiplanar CT image reconstructions of the cervical spine were also generated. RADIATION DOSE REDUCTION: This exam was performed according to the departmental dose-optimization program which includes automated exposure control, adjustment of the mA and/or kV according to patient size and/or use of iterative reconstruction technique. COMPARISON:  None Available. FINDINGS: CT HEAD FINDINGS Brain: No evidence of acute infarction, hemorrhage, hydrocephalus, extra-axial collection or mass lesion/mass effect. Chronic atrophic changes are noted. Vascular: Ectasia of the right vertebral artery and basilar artery is seen. Skull: Normal. Negative for fracture or focal lesion. Sinuses/Orbits: No acute finding. Other: None. CT CERVICAL SPINE FINDINGS Alignment: Loss of the normal cervical lordosis is noted. Skull base and vertebrae: 7 cervical segments are well visualized. Vertebral body height is well maintained. Osteophytic changes and facet hypertrophic changes are noted. No acute fracture or acute facet abnormality is noted. The odontoid is within normal limits. Soft tissues and spinal canal: Surrounding soft tissue structures are within normal limits. Upper chest: Visualized lung apices are unremarkable. Other: None IMPRESSION: CT of the head: Chronic atrophic changes without acute abnormality. CT of cervical spine: Multilevel degenerative change without acute abnormality. Electronically Signed   By: Alcide Clever M.D.   On: 08/22/2022 18:42   CT Cervical Spine Wo Contrast  Result Date: 08/22/2022 CLINICAL DATA:  Recent fall with headaches and neck pain, initial  encounter EXAM: CT HEAD WITHOUT CONTRAST CT CERVICAL SPINE WITHOUT CONTRAST TECHNIQUE: Multidetector CT imaging of the head and cervical spine was performed following the standard protocol without intravenous contrast. Multiplanar CT image reconstructions of the cervical spine were also generated. RADIATION DOSE REDUCTION: This exam was performed according to the departmental dose-optimization program which includes automated exposure control, adjustment of the mA and/or kV according to patient size and/or use of iterative reconstruction technique. COMPARISON:  None Available. FINDINGS: CT HEAD FINDINGS Brain: No evidence of acute infarction, hemorrhage, hydrocephalus, extra-axial collection or mass lesion/mass effect. Chronic atrophic changes are noted. Vascular: Ectasia of the right vertebral artery and basilar artery is seen. Skull: Normal. Negative for fracture or focal lesion. Sinuses/Orbits: No acute finding. Other: None. CT CERVICAL SPINE FINDINGS Alignment: Loss of the normal cervical lordosis is noted. Skull base and vertebrae: 7 cervical segments are well visualized. Vertebral body height is well maintained. Osteophytic changes and facet hypertrophic changes are noted. No acute fracture or acute facet abnormality is noted. The odontoid is within normal limits. Soft tissues and spinal canal: Surrounding soft tissue structures are within normal limits. Upper chest: Visualized lung apices are unremarkable. Other: None IMPRESSION: CT of the head: Chronic atrophic changes without acute abnormality. CT of cervical spine: Multilevel degenerative change without acute abnormality. Electronically Signed   By: Alcide Clever M.D.   On: 08/22/2022 18:42    Procedures Procedures    Medications Ordered in ED Medications - No data to display  ED Course/ Medical Decision Making/ A&P                             Medical Decision Making Amount and/or Complexity of Data Reviewed Labs: ordered. Radiology:  ordered.   72 year old male with fall earlier today.  Patient slipped in the shower and landed on his right side.  He bumped his forehead, no obvious trauma.  No complaints of headache LOC nausea vomiting.  CT scan of the head and neck was negative.  He complained of right rib pain and right hip pain.  Patient ambulatory.  His hip exam is  normal with normal hip and external rotation with no discomfort.  He had focal tenderness over the greater troches region, x-rays were read as negative for any acute bony abnormality to the right hip or pelvis.  His chest/rib x-rays were ordered and reviewed by me showing no acute bony abnormality or acute cardiopulmonary process.  His lungs are clear to auscultation and his vital signs are normal.  Patient appears well, stable and ready for discharge to home.  He will take Tylenol as needed for any pain or discomfort.  Use ice 20 minutes every hour over areas of soreness.  He understands signs symptoms return to the ER for. Final Clinical Impression(s) / ED Diagnoses Final diagnoses:  Injury of head, initial encounter  Contusion of right chest wall, initial encounter  Contusion of right hip, initial encounter    Rx / DC Orders ED Discharge Orders     None         Ronnette Juniper 08/22/22 2104    Jene Every, MD 08/24/22 [phone]

## 2022-08-22 NOTE — ED Triage Notes (Signed)
Pt states he slipped and fell in the shower around 4pm today but sister states that he fell this morning. Pt states he hit his head and is having pain on his right side of his body. Pt states he takes blood thinners but his sister doesn't believe he is. Meds on file say he was on eliquis in 2021 but nothing recent.

## 2022-09-19 ENCOUNTER — Encounter
Admission: EM | Disposition: A | Discharge status: Skilled Nursing Facility | Payer: Self-pay | Source: Skilled Nursing Facility | Attending: Internal Medicine

## 2022-09-19 ENCOUNTER — Emergency Department: Payer: Medicare Other

## 2022-09-19 ENCOUNTER — Inpatient Hospital Stay: Payer: Medicare Other | Admitting: Certified Registered"

## 2022-09-19 ENCOUNTER — Other Ambulatory Visit: Payer: Self-pay

## 2022-09-19 ENCOUNTER — Inpatient Hospital Stay
Admission: EM | Admit: 2022-09-19 | Discharge: 2022-09-23 | DRG: 481 | Disposition: A | Discharge status: Skilled Nursing Facility | Payer: Medicare Other | Source: Skilled Nursing Facility | Attending: Internal Medicine | Admitting: Internal Medicine

## 2022-09-19 ENCOUNTER — Inpatient Hospital Stay: Payer: Medicare Other

## 2022-09-19 DIAGNOSIS — M25551 Pain in right hip: Secondary | ICD-10-CM | POA: Diagnosis present

## 2022-09-19 DIAGNOSIS — Z5986 Financial insecurity: Secondary | ICD-10-CM

## 2022-09-19 DIAGNOSIS — Z79899 Other long term (current) drug therapy: Secondary | ICD-10-CM

## 2022-09-19 DIAGNOSIS — F32A Depression, unspecified: Secondary | ICD-10-CM | POA: Diagnosis present

## 2022-09-19 DIAGNOSIS — R32 Unspecified urinary incontinence: Secondary | ICD-10-CM | POA: Diagnosis present

## 2022-09-19 DIAGNOSIS — F17210 Nicotine dependence, cigarettes, uncomplicated: Secondary | ICD-10-CM | POA: Diagnosis present

## 2022-09-19 DIAGNOSIS — F209 Schizophrenia, unspecified: Secondary | ICD-10-CM | POA: Diagnosis present

## 2022-09-19 DIAGNOSIS — W19XXXA Unspecified fall, initial encounter: Secondary | ICD-10-CM | POA: Diagnosis not present

## 2022-09-19 DIAGNOSIS — S72141A Displaced intertrochanteric fracture of right femur, initial encounter for closed fracture: Secondary | ICD-10-CM | POA: Diagnosis present

## 2022-09-19 DIAGNOSIS — Z7951 Long term (current) use of inhaled steroids: Secondary | ICD-10-CM | POA: Diagnosis not present

## 2022-09-19 DIAGNOSIS — J8283 Eosinophilic asthma: Secondary | ICD-10-CM | POA: Diagnosis present

## 2022-09-19 DIAGNOSIS — W010XXA Fall on same level from slipping, tripping and stumbling without subsequent striking against object, initial encounter: Secondary | ICD-10-CM | POA: Diagnosis present

## 2022-09-19 DIAGNOSIS — J449 Chronic obstructive pulmonary disease, unspecified: Secondary | ICD-10-CM | POA: Diagnosis present

## 2022-09-19 DIAGNOSIS — F0393 Unspecified dementia, unspecified severity, with mood disturbance: Secondary | ICD-10-CM | POA: Diagnosis present

## 2022-09-19 DIAGNOSIS — E440 Moderate protein-calorie malnutrition: Secondary | ICD-10-CM | POA: Diagnosis present

## 2022-09-19 DIAGNOSIS — B181 Chronic viral hepatitis B without delta-agent: Secondary | ICD-10-CM | POA: Diagnosis present

## 2022-09-19 DIAGNOSIS — Z7901 Long term (current) use of anticoagulants: Secondary | ICD-10-CM

## 2022-09-19 DIAGNOSIS — I480 Paroxysmal atrial fibrillation: Secondary | ICD-10-CM | POA: Diagnosis present

## 2022-09-19 DIAGNOSIS — F419 Anxiety disorder, unspecified: Secondary | ICD-10-CM | POA: Diagnosis present

## 2022-09-19 DIAGNOSIS — Z6824 Body mass index (BMI) 24.0-24.9, adult: Secondary | ICD-10-CM

## 2022-09-19 DIAGNOSIS — D638 Anemia in other chronic diseases classified elsewhere: Secondary | ICD-10-CM | POA: Diagnosis present

## 2022-09-19 DIAGNOSIS — E785 Hyperlipidemia, unspecified: Secondary | ICD-10-CM | POA: Diagnosis present

## 2022-09-19 DIAGNOSIS — K219 Gastro-esophageal reflux disease without esophagitis: Secondary | ICD-10-CM | POA: Diagnosis present

## 2022-09-19 DIAGNOSIS — S72001A Fracture of unspecified part of neck of right femur, initial encounter for closed fracture: Secondary | ICD-10-CM | POA: Diagnosis present

## 2022-09-19 DIAGNOSIS — I10 Essential (primary) hypertension: Secondary | ICD-10-CM | POA: Diagnosis present

## 2022-09-19 HISTORY — PX: INTRAMEDULLARY (IM) NAIL INTERTROCHANTERIC: SHX5875

## 2022-09-19 LAB — BASIC METABOLIC PANEL
Anion gap: 12 (ref 5–15)
Anion gap: 11 (ref 5–15)
BUN: 10 mg/dL (ref 8–23)
BUN: 10 mg/dL (ref 8–23)
CO2: 23 mmol/L (ref 22–32)
CO2: 23 mmol/L (ref 22–32)
Calcium: 8.4 mg/dL — ABNORMAL LOW (ref 8.9–10.3)
Calcium: 8.3 mg/dL — ABNORMAL LOW (ref 8.9–10.3)
Chloride: 101 mmol/L (ref 98–111)
Chloride: 104 mmol/L (ref 98–111)
Creatinine, Ser: 0.88 mg/dL (ref 0.61–1.24)
Creatinine, Ser: 0.75 mg/dL (ref 0.61–1.24)
GFR, Estimated: >60 mL/min (ref >60)
GFR, Estimated: >60 mL/min (ref >60)
Glucose, Bld: 122 mg/dL — ABNORMAL HIGH (ref 70–99)
Glucose, Bld: 138 mg/dL — ABNORMAL HIGH (ref 70–99)
Potassium: 4.1 mmol/L (ref 3.5–5.1)
Potassium: 3.2 mmol/L — ABNORMAL LOW (ref 3.5–5.1)
Sodium: 136 mmol/L (ref 135–145)
Sodium: 138 mmol/L (ref 135–145)

## 2022-09-19 LAB — TYPE AND SCREEN
ABO/RH(D): B POS
Antibody Screen: NEGATIVE

## 2022-09-19 LAB — CBC
HCT: 33.5 % — ABNORMAL LOW (ref 39.0–52.0)
HCT: 32.9 % — ABNORMAL LOW (ref 39.0–52.0)
Hemoglobin: 10.8 g/dL — ABNORMAL LOW (ref 13.0–17.0)
Hemoglobin: 10.7 g/dL — ABNORMAL LOW (ref 13.0–17.0)
MCH: 23.2 pg — ABNORMAL LOW (ref 26.0–34.0)
MCH: 23.3 pg — ABNORMAL LOW (ref 26.0–34.0)
MCHC: 32.2 g/dL (ref 30.0–36.0)
MCHC: 32.5 g/dL (ref 30.0–36.0)
MCV: 72 fL — ABNORMAL LOW (ref 80.0–100.0)
MCV: 71.5 fL — ABNORMAL LOW (ref 80.0–100.0)
Platelets: 369 10*3/uL (ref 150–400)
Platelets: 360 10*3/uL (ref 150–400)
RBC: 4.65 MIL/uL (ref 4.22–5.81)
RBC: 4.6 MIL/uL (ref 4.22–5.81)
RDW: 18.4 % — ABNORMAL HIGH (ref 11.5–15.5)
RDW: 18.1 % — ABNORMAL HIGH (ref 11.5–15.5)
WBC: 8.8 10*3/uL (ref 4.0–10.5)
WBC: 11.3 10*3/uL — ABNORMAL HIGH (ref 4.0–10.5)
nRBC: 0 % (ref 0.0–0.2)
nRBC: 0 % (ref 0.0–0.2)

## 2022-09-19 LAB — MRSA NEXT GEN BY PCR, NASAL: MRSA by PCR Next Gen: NOT DETECTED

## 2022-09-19 SURGERY — FIXATION, FRACTURE, INTERTROCHANTERIC, WITH INTRAMEDULLARY ROD
Anesthesia: General | Laterality: Right

## 2022-09-19 MED ORDER — FENTANYL CITRATE (PF) 100 MCG/2ML IJ SOLN
INTRAMUSCULAR | Status: DC | PRN
  Administered 2022-09-19 (×5): 50 ug via INTRAVENOUS

## 2022-09-19 MED ORDER — CEFAZOLIN SODIUM-DEXTROSE 2-4 GM/100ML-% IV SOLN
INTRAVENOUS | Status: AC
  Filled 2022-09-19: qty 100

## 2022-09-19 MED ORDER — OXYCODONE HCL 5 MG/5ML PO SOLN: 5.0000 mg | Freq: Once | ORAL | Status: DC | PRN

## 2022-09-19 MED ORDER — SUGAMMADEX SODIUM 200 MG/2ML IV SOLN
INTRAVENOUS | Status: DC | PRN
  Administered 2022-09-19: 200 mg via INTRAVENOUS

## 2022-09-19 MED ORDER — FENTANYL CITRATE (PF) 100 MCG/2ML IJ SOLN: 25.0000 ug | INTRAMUSCULAR | Status: DC | PRN

## 2022-09-19 MED ORDER — TRANEXAMIC ACID 1000 MG/10ML IV SOLN
INTRAVENOUS | Status: DC | PRN
  Administered 2022-09-19: 1000 mg via INTRAVENOUS

## 2022-09-19 MED ORDER — POLYETHYLENE GLYCOL 3350 17 G PO PACK
17.0000 g | PACK | Freq: Every day | ORAL | Status: DC
  Administered 2022-09-19 – 2022-09-23 (×5): 17 g via ORAL
  Filled 2022-09-19 (×5): qty 1

## 2022-09-19 MED ORDER — 0.9 % SODIUM CHLORIDE (POUR BTL) OPTIME
TOPICAL | Status: DC | PRN
  Administered 2022-09-19: 500 mL

## 2022-09-19 MED ORDER — TRANEXAMIC ACID-NACL 1000-0.7 MG/100ML-% IV SOLN
INTRAVENOUS | Status: AC
  Filled 2022-09-19: qty 100

## 2022-09-19 MED ORDER — HYDROCODONE-ACETAMINOPHEN 5-325 MG PO TABS
1.0000 | ORAL_TABLET | ORAL | Status: DC | PRN
  Administered 2022-09-21 – 2022-09-23 (×2): 2 via ORAL
  Filled 2022-09-19 (×3): qty 2

## 2022-09-19 MED ORDER — PROMETHAZINE HCL 25 MG/ML IJ SOLN: 6.2500 mg | INTRAMUSCULAR | Status: DC | PRN

## 2022-09-19 MED ORDER — METOPROLOL TARTRATE 5 MG/5ML IV SOLN
INTRAVENOUS | Status: DC | PRN
Start: 2022-09-19 — End: 2022-09-19
  Administered 2022-09-19 (×5): 1 mg via INTRAVENOUS

## 2022-09-19 MED ORDER — PROPOFOL 10 MG/ML IV BOLUS
INTRAVENOUS | Status: DC | PRN
  Administered 2022-09-19: 100 mg via INTRAVENOUS

## 2022-09-19 MED ORDER — FENTANYL CITRATE (PF) 250 MCG/5ML IJ SOLN
INTRAMUSCULAR | Status: AC
  Filled 2022-09-19: qty 5

## 2022-09-19 MED ORDER — PHENOL 1.4 % MT LIQD: 1.0000 | OROMUCOSAL | Status: DC | PRN

## 2022-09-19 MED ORDER — APIXABAN 5 MG PO TABS: 5.0000 mg | ORAL_TABLET | Freq: Two times a day (BID) | ORAL | Status: DC

## 2022-09-19 MED ORDER — HYDROCODONE-ACETAMINOPHEN 5-325 MG PO TABS
1.0000 | ORAL_TABLET | Freq: Four times a day (QID) | ORAL | Status: DC | PRN
  Administered 2022-09-19: 1 via ORAL
  Filled 2022-09-19: qty 1

## 2022-09-19 MED ORDER — PHENYLEPHRINE HCL-NACL 20-0.9 MG/250ML-% IV SOLN
INTRAVENOUS | Status: DC | PRN
Start: 2022-09-19 — End: 2022-09-19
  Administered 2022-09-19: 50 ug/min via INTRAVENOUS

## 2022-09-19 MED ORDER — ACETAMINOPHEN 325 MG PO TABS
325.0000 mg | ORAL_TABLET | Freq: Four times a day (QID) | ORAL | Status: DC | PRN
  Administered 2022-09-20: 500 mg via ORAL
  Filled 2022-09-19: qty 2

## 2022-09-19 MED ORDER — PHENYLEPHRINE 80 MCG/ML (10ML) SYRINGE FOR IV PUSH (FOR BLOOD PRESSURE SUPPORT)
PREFILLED_SYRINGE | INTRAVENOUS | Status: DC | PRN
  Administered 2022-09-19: 80 ug via INTRAVENOUS
  Administered 2022-09-19 (×2): 160 ug via INTRAVENOUS

## 2022-09-19 MED ORDER — SODIUM CHLORIDE 0.9 % IV SOLN
INTRAVENOUS | Status: DC | PRN
Start: 2022-09-19 — End: 2022-09-19

## 2022-09-19 MED ORDER — ALBUTEROL SULFATE HFA 108 (90 BASE) MCG/ACT IN AERS: 2.0000 | INHALATION_SPRAY | Freq: Four times a day (QID) | RESPIRATORY_TRACT | Status: DC | PRN

## 2022-09-19 MED ORDER — MORPHINE SULFATE (PF) 2 MG/ML IV SOLN
2.0000 mg | Freq: Once | INTRAVENOUS | Status: AC
  Administered 2022-09-19: 2 mg via INTRAVENOUS
  Filled 2022-09-19: qty 1

## 2022-09-19 MED ORDER — SODIUM CHLORIDE 0.9 % IV SOLN: INTRAVENOUS | Status: DC

## 2022-09-19 MED ORDER — HYDROXYZINE HCL 10 MG PO TABS
50.0000 mg | ORAL_TABLET | Freq: Two times a day (BID) | ORAL | Status: DC | PRN
  Administered 2022-09-19: 50 mg via ORAL
  Filled 2022-09-19 (×2): qty 5

## 2022-09-19 MED ORDER — NICOTINE 21 MG/24HR TD PT24
21.0000 mg | MEDICATED_PATCH | Freq: Every day | TRANSDERMAL | Status: DC
  Administered 2022-09-19 – 2022-09-23 (×5): 21 mg via TRANSDERMAL
  Filled 2022-09-19 (×5): qty 1

## 2022-09-19 MED ORDER — TRAZODONE HCL 100 MG PO TABS
300.0000 mg | ORAL_TABLET | Freq: Every day | ORAL | Status: DC
  Administered 2022-09-20 – 2022-09-22 (×3): 300 mg via ORAL
  Filled 2022-09-19 (×3): qty 3

## 2022-09-19 MED ORDER — KETOROLAC TROMETHAMINE 15 MG/ML IJ SOLN
INTRAMUSCULAR | Status: AC
  Filled 2022-09-19: qty 1

## 2022-09-19 MED ORDER — MORPHINE SULFATE (PF) 2 MG/ML IV SOLN: 0.5000 mg | INTRAVENOUS | Status: DC | PRN

## 2022-09-19 MED ORDER — ONDANSETRON HCL 4 MG/2ML IJ SOLN
4.0000 mg | INTRAMUSCULAR | Status: DC | PRN
  Administered 2022-09-19 (×2): 4 mg via INTRAVENOUS
  Filled 2022-09-19: qty 2

## 2022-09-19 MED ORDER — OXYCODONE HCL 5 MG PO TABS
5.0000 mg | ORAL_TABLET | Freq: Three times a day (TID) | ORAL | Status: DC | PRN
  Administered 2022-09-19: 5 mg via ORAL
  Filled 2022-09-19: qty 1

## 2022-09-19 MED ORDER — FE FUM-VIT C-VIT B12-FA 460-60-0.01-1 MG PO CAPS
1.0000 | ORAL_CAPSULE | Freq: Every day | ORAL | Status: DC
  Administered 2022-09-20: 1 via ORAL
  Filled 2022-09-19 (×2): qty 1

## 2022-09-19 MED ORDER — DROPERIDOL 2.5 MG/ML IJ SOLN: 0.6250 mg | Freq: Once | INTRAMUSCULAR | Status: DC | PRN

## 2022-09-19 MED ORDER — ADULT MULTIVITAMIN W/MINERALS CH
1.0000 | ORAL_TABLET | Freq: Every day | ORAL | Status: DC
  Administered 2022-09-20 – 2022-09-23 (×4): 1 via ORAL
  Filled 2022-09-19 (×4): qty 1

## 2022-09-19 MED ORDER — ONDANSETRON HCL 4 MG PO TABS: 4.0000 mg | ORAL_TABLET | Freq: Four times a day (QID) | ORAL | Status: DC | PRN

## 2022-09-19 MED ORDER — BUPIVACAINE-EPINEPHRINE (PF) 0.25% -1:200000 IJ SOLN
INTRAMUSCULAR | Status: DC | PRN
  Administered 2022-09-19: 30 mL

## 2022-09-19 MED ORDER — NALOXONE HCL 4 MG/0.1ML NA LIQD: 1.0000 | Freq: Once | NASAL | Status: DC | PRN

## 2022-09-19 MED ORDER — POTASSIUM CHLORIDE 20 MEQ PO PACK
40.0000 meq | PACK | Freq: Once | ORAL | Status: AC
  Administered 2022-09-19: 40 meq via ORAL
  Filled 2022-09-19: qty 2

## 2022-09-19 MED ORDER — ENOXAPARIN SODIUM 40 MG/0.4ML IJ SOSY
40.0000 mg | PREFILLED_SYRINGE | INTRAMUSCULAR | Status: DC
  Administered 2022-09-20 – 2022-09-23 (×4): 40 mg via SUBCUTANEOUS
  Filled 2022-09-19 (×4): qty 0.4

## 2022-09-19 MED ORDER — ACETAMINOPHEN 500 MG PO TABS
500.0000 mg | ORAL_TABLET | Freq: Four times a day (QID) | ORAL | Status: AC
  Administered 2022-09-20: 500 mg via ORAL
  Filled 2022-09-19 (×4): qty 1

## 2022-09-19 MED ORDER — MIDAZOLAM HCL 5 MG/5ML IJ SOLN
INTRAMUSCULAR | Status: DC | PRN
  Administered 2022-09-19 (×2): 1 mg via INTRAVENOUS

## 2022-09-19 MED ORDER — HYDROCODONE-ACETAMINOPHEN 7.5-325 MG PO TABS
1.0000 | ORAL_TABLET | ORAL | Status: DC | PRN
  Administered 2022-09-22 – 2022-09-23 (×2): 2 via ORAL
  Filled 2022-09-19 (×2): qty 2

## 2022-09-19 MED ORDER — SUCCINYLCHOLINE CHLORIDE 200 MG/10ML IV SOSY
PREFILLED_SYRINGE | INTRAVENOUS | Status: DC | PRN
  Administered 2022-09-19: 100 mg via INTRAVENOUS

## 2022-09-19 MED ORDER — MONTELUKAST SODIUM 10 MG PO TABS
10.0000 mg | ORAL_TABLET | Freq: Every day | ORAL | Status: DC
  Administered 2022-09-19 – 2022-09-23 (×5): 10 mg via ORAL
  Filled 2022-09-19 (×5): qty 1

## 2022-09-19 MED ORDER — PANTOPRAZOLE SODIUM 40 MG PO TBEC
40.0000 mg | DELAYED_RELEASE_TABLET | Freq: Every day | ORAL | Status: DC
  Administered 2022-09-19 – 2022-09-23 (×5): 40 mg via ORAL
  Filled 2022-09-19 (×5): qty 1

## 2022-09-19 MED ORDER — HALOPERIDOL 5 MG PO TABS
10.0000 mg | ORAL_TABLET | Freq: Two times a day (BID) | ORAL | Status: DC
  Administered 2022-09-19 – 2022-09-23 (×8): 10 mg via ORAL
  Filled 2022-09-19 (×9): qty 2

## 2022-09-19 MED ORDER — ENSURE ENLIVE PO LIQD
237.0000 mL | Freq: Two times a day (BID) | ORAL | Status: DC
  Administered 2022-09-21 – 2022-09-23 (×5): 237 mL via ORAL

## 2022-09-19 MED ORDER — MOMETASONE FURO-FORMOTEROL FUM 100-5 MCG/ACT IN AERO
2.0000 | INHALATION_SPRAY | Freq: Two times a day (BID) | RESPIRATORY_TRACT | Status: DC
  Administered 2022-09-20 – 2022-09-23 (×7): 2 via RESPIRATORY_TRACT
  Filled 2022-09-19 (×2): qty 8.8

## 2022-09-19 MED ORDER — ACETAMINOPHEN 325 MG PO TABS: 650.0000 mg | ORAL_TABLET | ORAL | Status: DC | PRN

## 2022-09-19 MED ORDER — DOCUSATE SODIUM 100 MG PO CAPS
100.0000 mg | ORAL_CAPSULE | Freq: Two times a day (BID) | ORAL | Status: DC
  Administered 2022-09-20 – 2022-09-23 (×7): 100 mg via ORAL
  Filled 2022-09-19 (×7): qty 1

## 2022-09-19 MED ORDER — METOCLOPRAMIDE HCL 5 MG/ML IJ SOLN: 5.0000 mg | Freq: Three times a day (TID) | INTRAMUSCULAR | Status: DC | PRN

## 2022-09-19 MED ORDER — TRAZODONE HCL 50 MG PO TABS: 25.0000 mg | ORAL_TABLET | Freq: Every evening | ORAL | Status: DC | PRN

## 2022-09-19 MED ORDER — BENZTROPINE MESYLATE 1 MG PO TABS
1.0000 mg | ORAL_TABLET | Freq: Every day | ORAL | Status: DC
  Administered 2022-09-19 – 2022-09-23 (×5): 1 mg via ORAL
  Filled 2022-09-19 (×5): qty 1

## 2022-09-19 MED ORDER — MENTHOL 3 MG MT LOZG: 1.0000 | LOZENGE | OROMUCOSAL | Status: DC | PRN

## 2022-09-19 MED ORDER — OXYCODONE HCL 5 MG PO TABS: 5.0000 mg | ORAL_TABLET | Freq: Once | ORAL | Status: DC | PRN

## 2022-09-19 MED ORDER — KETOROLAC TROMETHAMINE 15 MG/ML IJ SOLN
7.5000 mg | Freq: Four times a day (QID) | INTRAMUSCULAR | Status: AC
  Administered 2022-09-19 – 2022-09-20 (×4): 7.5 mg via INTRAVENOUS
  Filled 2022-09-19 (×3): qty 1

## 2022-09-19 MED ORDER — MAGNESIUM HYDROXIDE 400 MG/5ML PO SUSP
30.0000 mL | Freq: Every day | ORAL | Status: DC | PRN
  Administered 2022-09-22 – 2022-09-23 (×2): 30 mL via ORAL
  Filled 2022-09-19 (×2): qty 30

## 2022-09-19 MED ORDER — TIOTROPIUM BROMIDE MONOHYDRATE 18 MCG IN CAPS
18.0000 ug | ORAL_CAPSULE | Freq: Every day | RESPIRATORY_TRACT | Status: DC
  Filled 2022-09-19: qty 5

## 2022-09-19 MED ORDER — ALBUTEROL SULFATE (2.5 MG/3ML) 0.083% IN NEBU: 2.5000 mg | INHALATION_SOLUTION | Freq: Four times a day (QID) | RESPIRATORY_TRACT | Status: DC | PRN

## 2022-09-19 MED ORDER — CEFAZOLIN SODIUM-DEXTROSE 2-4 GM/100ML-% IV SOLN
2.0000 g | Freq: Four times a day (QID) | INTRAVENOUS | Status: AC
  Administered 2022-09-19 – 2022-09-20 (×2): 2 g via INTRAVENOUS
  Filled 2022-09-19 (×2): qty 100

## 2022-09-19 MED ORDER — TIOTROPIUM BROMIDE MONOHYDRATE 18 MCG IN CAPS
18.0000 ug | ORAL_CAPSULE | Freq: Every day | RESPIRATORY_TRACT | Status: DC
  Administered 2022-09-20 – 2022-09-23 (×4): 18 ug via RESPIRATORY_TRACT
  Filled 2022-09-19 (×2): qty 5

## 2022-09-19 MED ORDER — VASOPRESSIN 20 UNIT/ML IV SOLN
INTRAVENOUS | Status: DC | PRN
  Administered 2022-09-19 (×2): 2 [IU] via INTRAVENOUS

## 2022-09-19 MED ORDER — MIDAZOLAM HCL 2 MG/2ML IJ SOLN
INTRAMUSCULAR | Status: AC
  Filled 2022-09-19: qty 2

## 2022-09-19 MED ORDER — LIDOCAINE HCL (CARDIAC) PF 100 MG/5ML IV SOSY
PREFILLED_SYRINGE | INTRAVENOUS | Status: DC | PRN
  Administered 2022-09-19: 100 mg via INTRAVENOUS

## 2022-09-19 MED ORDER — ROCURONIUM BROMIDE 100 MG/10ML IV SOLN
INTRAVENOUS | Status: DC | PRN
  Administered 2022-09-19: 30 mg via INTRAVENOUS

## 2022-09-19 MED ORDER — METOCLOPRAMIDE HCL 5 MG PO TABS: 5.0000 mg | ORAL_TABLET | Freq: Three times a day (TID) | ORAL | Status: DC | PRN

## 2022-09-19 MED ORDER — PHENYLEPHRINE HCL-NACL 20-0.9 MG/250ML-% IV SOLN
INTRAVENOUS | Status: AC
  Filled 2022-09-19: qty 250

## 2022-09-19 MED ORDER — LACTATED RINGERS IV SOLN: INTRAVENOUS | Status: DC | PRN

## 2022-09-19 MED ORDER — CEFAZOLIN SODIUM-DEXTROSE 1-4 GM/50ML-% IV SOLN
INTRAVENOUS | Status: DC | PRN
Start: 2022-09-19 — End: 2022-09-19
  Administered 2022-09-19: 2 g via INTRAVENOUS

## 2022-09-19 MED ORDER — ONDANSETRON HCL 4 MG/2ML IJ SOLN: 4.0000 mg | Freq: Four times a day (QID) | INTRAMUSCULAR | Status: DC | PRN

## 2022-09-19 MED ORDER — ACETAMINOPHEN 10 MG/ML IV SOLN: 1000.0000 mg | Freq: Once | INTRAVENOUS | Status: DC | PRN

## 2022-09-19 SURGICAL SUPPLY — 58 items
10 mm Tapered Drill Bit IMPLANT
16 mm Flexible Drill Bit IMPLANT
3.2 mm Guide Wire, 400 mm IMPLANT
6 mm/9 mm Cannulated Stepped Drill Bit IMPLANT
ADH SKN CLS APL DERMABOND .7 (GAUZE/BANDAGES/DRESSINGS) ×1
APL PRP STRL LF DISP 70% ISPRP (MISCELLANEOUS) ×1
BIT DRILL CALIBRATED 4.2 (BIT) IMPLANT
BIT DRILL CANN 16 HIP (BIT) IMPLANT
BIT DRILL CANN STP 6/9 HIP (BIT) IMPLANT
BIT DRILL TAPERED 10 (BIT) IMPLANT
BLADE TFNA HELICAL 100 NS (Anchor) IMPLANT
BNDG CMPR 5X6 CHSV STRCH STRL (GAUZE/BANDAGES/DRESSINGS) ×2
BNDG COHESIVE 6X5 TAN ST LF (GAUZE/BANDAGES/DRESSINGS) ×2 IMPLANT
CHLORAPREP W/TINT 26 (MISCELLANEOUS) ×1 IMPLANT
DERMABOND ADVANCED .7 DNX12 (GAUZE/BANDAGES/DRESSINGS) ×1 IMPLANT
DRAPE C-ARM XRAY 36X54 (DRAPES) ×1 IMPLANT
DRAPE C-ARMOR (DRAPES) IMPLANT
DRAPE SHEET LG 3/4 BI-LAMINATE (DRAPES) ×1 IMPLANT
DRILL BIT CALIBRATED 4.2 (BIT) ×1
DRSG OPSITE POSTOP 3X4 (GAUZE/BANDAGES/DRESSINGS) IMPLANT
DRSG OPSITE POSTOP 4X6 (GAUZE/BANDAGES/DRESSINGS) IMPLANT
ELECT CAUTERY BLADE 6.4 (BLADE) IMPLANT
ELECT REM PT RETURN 9FT ADLT (ELECTROSURGICAL)
ELECTRODE REM PT RTRN 9FT ADLT (ELECTROSURGICAL) IMPLANT
GLOVE PI ORTHO PRO STRL 7.5 (GLOVE) ×2 IMPLANT
GLOVE SURG SYN 7.5 E (GLOVE) ×1 IMPLANT
GLOVE SURG SYN 7.5 PF PI (GLOVE) ×1 IMPLANT
GOWN SRG XL LVL 3 NONREINFORCE (GOWNS) ×1 IMPLANT
GOWN STRL NON-REIN TWL XL LVL3 (GOWNS) ×1
GOWN STRL REUS W/ TWL LRG LVL3 (GOWN DISPOSABLE) ×1 IMPLANT
GOWN STRL REUS W/TWL LRG LVL3 (GOWN DISPOSABLE) ×1
GUIDEWIRE 3.2X400 (WIRE) IMPLANT
HANDLE YANKAUER SUCT OPEN TIP (MISCELLANEOUS) ×1 IMPLANT
IMPL DEG TI CANN 11MM/130 (Orthopedic Implant) IMPLANT
IMPLANT DEG TI CANN 11MM/130 (Orthopedic Implant) ×1 IMPLANT
KIT PATIENT CARE HANA TABLE (KITS) ×1 IMPLANT
KIT TURNOVER CYSTO (KITS) ×1 IMPLANT
MANIFOLD NEPTUNE II (INSTRUMENTS) ×1 IMPLANT
MAT ABSORB FLUID 56X50 GRAY (MISCELLANEOUS) ×1 IMPLANT
NDL FILTER BLUNT 18X1 1/2 (NEEDLE) ×1 IMPLANT
NDL HYPO 21X1.5 SAFETY (NEEDLE) ×1 IMPLANT
NDL SAFETY ECLIP 18X1.5 (MISCELLANEOUS) ×1 IMPLANT
NEEDLE FILTER BLUNT 18X1 1/2 (NEEDLE) ×1 IMPLANT
NEEDLE HYPO 21X1.5 SAFETY (NEEDLE) ×1 IMPLANT
NS IRRIG 500ML POUR BTL (IV SOLUTION) ×1 IMPLANT
PACK HIP COMPR (MISCELLANEOUS) ×1 IMPLANT
PAD ARMBOARD 7.5X6 YLW CONV (MISCELLANEOUS) ×1 IMPLANT
QC/330 100mm Calibration IMPLANT
SCREW LOCK STAR 5X36 (Screw) IMPLANT
SLEEVE SCD COMPRESS KNEE MED (STOCKING) ×1 IMPLANT
SUT QUILL MONODERM 3-0 PS-2 (SUTURE) ×1 IMPLANT
SUT VIC AB 1 CT1 36 (SUTURE) ×1 IMPLANT
SUT VIC AB 2-0 CT2 27 (SUTURE) ×1 IMPLANT
SYR 30ML LL (SYRINGE) ×1 IMPLANT
SYR TB 1ML LL NO SAFETY (SYRINGE) ×1 IMPLANT
TAPE MICROFOAM 4IN (TAPE) ×1 IMPLANT
TRAP FLUID SMOKE EVACUATOR (MISCELLANEOUS) IMPLANT
WATER STERILE IRR 1000ML POUR (IV SOLUTION) ×1 IMPLANT

## 2022-09-19 NOTE — H&P (Signed)
PATIENT NAME: Adrian Gillespie    MR#:  [id number]  DATE OF BIRTH:  1950/03/24  DATE OF ADMISSION:  09/19/2022  PRIMARY CARE PHYSICIAN: Andres Shad, MD   Patient is coming from: Home  REQUESTING/REFERRING PHYSICIAN: Chiquita Loth, MD  CHIEF COMPLAINT:   Chief Complaint  Patient presents with   Fall    HISTORY OF PRESENT ILLNESS:  Adrian Gillespie is a 72 y.o. Caucasian male with medical history significant for COPD, GERD, type pretension, dyslipidemia, schizophrenia, anxiety and depression, presented to the emergency room with acute onset of witnessed accidental mechanical fall with subsequent right hip pain.  The patient staff witnessed the patient stumble and fall directly onto his right hip.  No presyncope or syncope.  No paresthesias or focal muscle weakness.  No chest pain or palpitations.  No cough or wheezing or hemoptysis.  No dysuria, oliguria or hematuria or flank pain.  He denies any nausea or vomiting or abdominal pain.  No tinnitus or vertigo.  He was given 50 mcg of IV fentanyl by EMS prior to arrival to the ED.  ED Course: When he came to the ED, vital signs were within normal.  Labs revealed unremarkable BMP.  CBC showed anemia slightly worse than previous levels with microcytosis.. EKG as reviewed by me : EKG sinus rhythm with a rate of 90 with prolonged PR interval with QTc of 453 MS and borderline right axis deviation. Imaging: Portable chest x-ray showed no acute cardiopulmonary disease.  2 view right femur x-ray showed no acute fracture.  Hip x-ray with pelvis 2-3 views showed the right mildly displaced intertrochanteric fracture.  The patient was given 20 g of IV morphine sulfate for pain.  Dr. Audelia Acton was notified about the patient.  He will be admitted to a medical bed for further evaluation and management. 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 No pertinent surgical history.  SOCIAL HISTORY:   Social History   Tobacco Use   Smoking status: Every Day    Current packs/day: 0.50    Types: Cigarettes   Smokeless tobacco: Never   Tobacco comments:    quit 1 month ago  Substance Use Topics   Alcohol use: Not Currently    FAMILY HISTORY:   Family History  Problem Relation Age of Onset   Arthritis/Rheumatoid Mother     DRUG ALLERGIES:  No Known Allergies  REVIEW OF SYSTEMS:   ROS As per history of present illness. All pertinent systems were reviewed above. Constitutional, HEENT, cardiovascular, respiratory, GI, GU, musculoskeletal, neuro, psychiatric, endocrine, integumentary and hematologic systems were reviewed and are otherwise negative/unremarkable except for positive findings mentioned above in the HPI.   MEDICATIONS AT HOME:   Prior to Admission medications   Medication Sig Start Date End Date Taking? Authorizing Provider  ADVAIR HFA [phone] MCG/ACT inhaler Inhale 2 puffs into the lungs 2 (two) times daily. 01/10/20  Yes [provider]  benztropine (COGENTIN) 1 MG tablet Take 1 mg by mouth daily.   Yes [provider]  BEVESPI AEROSPHERE  9-4.8 MCG/ACT AERO Inhale 2 puffs into the lungs at bedtime.  03/31/19  Yes [provider]  haloperidol (HALDOL) 10 MG tablet Take 10 mg by mouth 2 (two) times daily.   Yes [provider]  hydrOXYzine (VISTARIL) 50 MG capsule Take 50 mg by mouth 2 (two) times daily as needed for anxiety.  04/14/19  Yes [provider]  INVEGA SUSTENNA 234 MG/1.5ML SUSY injection Inject 234 mg into the muscle every 30 (thirty) days. 04/18/19  Yes [provider]  montelukast  (SINGULAIR) 10 MG tablet Take 10 mg by mouth daily. 11/25/19  Yes [provider]  naloxone [facility]) nasal spray 4 mg/0.1 mL One spray in nostril for opioid reversal if unresponsive 02/07/20  Yes Wieting, Richard, MD  nicotine (NICODERM CQ - DOSED IN MG/24 HOURS) 21 mg/24hr patch Place 1 patch (21 mg total) onto the skin daily. 05/26/19  Yes Dhungel, Nishant, MD  omeprazole (PRILOSEC) 20 MG capsule Take 20 mg by mouth daily.   Yes [provider]  oxyCODONE (OXY IR/ROXICODONE) 5 MG immediate release tablet Take 1 tablet (5 mg total) by mouth every 8 (eight) hours as needed for severe pain. 02/07/20  Yes Wieting, Richard, MD  polyethylene glycol (MIRALAX / GLYCOLAX) 17 g packet Take 17 g by mouth daily. 02/07/20  Yes Wieting, Richard, MD  tiotropium (SPIRIVA HANDIHALER) 18 MCG inhalation capsule Place 1 capsule (18 mcg total) into inhaler and inhale daily. 04/05/19  Yes Dhungel, Nishant, MD  traZODone (DESYREL) 100 MG tablet Take 300 mg by mouth at bedtime.   Yes [provider]  acetaminophen (TYLENOL) 325 MG tablet Take 2 tablets (650 mg total) by mouth every 6 (six) hours as needed for mild pain or moderate pain. 02/07/20   Alford Highland, MD  albuterol (VENTOLIN HFA) 108 (90 Base) MCG/ACT inhaler Inhale 2 puffs into the lungs every 6 (six) hours as needed for shortness of breath or wheezing. 04/05/19   Dhungel, Theda Belfast, MD  apixaban (ELIQUIS) 5 MG TABS tablet Take 1 tablet (5 mg total) by mouth 2 (two) times daily. 04/23/19   Rolly Salter, MD  benzonatate (TESSALON) 100 MG capsule Take 1 capsule (100 mg total) by mouth 3 (three) times daily. Patient not taking: Reported on 09/19/2022 04/23/19   Rolly Salter, MD  feeding supplement, ENSURE ENLIVE, (ENSURE ENLIVE) LIQD Take 237 mLs by mouth 2 (two) times daily between meals. 05/26/19   Dhungel, Nishant, MD      VITAL SIGNS:  Blood pressure (!) 130/116, pulse 95, temperature 98.4 F (36.9 C), temperature source Oral, resp.  rate 20, SpO2 92%.  PHYSICAL EXAMINATION:  Physical Exam  GENERAL:  72 y.o.-year-old Caucasian male patient lying in the bed with no acute distress.  EYES: Pupils equal, round, reactive to light and accommodation. No scleral icterus. Extraocular muscles intact.  HEENT: Head atraumatic, normocephalic. Oropharynx and nasopharynx clear.  NECK:  Supple, no jugular venous distention. No thyroid enlargement, no tenderness.  LUNGS: Normal breath sounds bilaterally, no wheezing, rales,rhonchi or crepitation. No use of accessory muscles of respiration.  CARDIOVASCULAR: Regular rate and rhythm, S1, S2 normal. No murmurs, rubs, or gallops.  ABDOMEN: Soft, nondistended, nontender. Bowel sounds present. No organomegaly or mass.  EXTREMITIES: No pedal edema, cyanosis, or clubbing.  NEUROLOGIC: Cranial nerves II through XII are intact. Muscle strength 5/5 in all extremities. Sensation intact. Gait not checked. Musculoskeletal: Right lateral hip tenderness. PSYCHIATRIC: The patient is alert and oriented x 3.  Normal affect and good eye  contact. SKIN: No obvious rash, lesion, or ulcer.   LABORATORY PANEL:   CBC Recent Labs  Lab 09/19/22 0440  WBC 11.3*  HGB 10.7*  HCT 32.9*  PLT 360   ------------------------------------------------------------------------------------------------------------------  Chemistries  Recent Labs  Lab 09/19/22 0440  NA 138  K 3.2*  CL 104  CO2 23  GLUCOSE 138*  BUN 10  CREATININE 0.75  CALCIUM 8.3*   ------------------------------------------------------------------------------------------------------------------  Cardiac Enzymes No results for input(s): "TROPONINI" in the last 168 hours. ------------------------------------------------------------------------------------------------------------------  RADIOLOGY:  DG Femur Min 2 Views Right  Result Date: 09/19/2022 CLINICAL DATA:  Femur x-rays requested by orthopedics. Intertrochanteric fracture. Right  hip pain after fall EXAM: RIGHT FEMUR 2 VIEWS COMPARISON:  None Available. FINDINGS: No fracture in the distal right femur. Right knee osteoarthritis. Unremarkable soft tissues. IMPRESSION: No acute fracture in the distal femur. Electronically Signed   By: Minerva Fester M.D.   On: 09/19/2022 01:47   DG Chest 1 View  Result Date: 09/19/2022 CLINICAL DATA:  Fall.  Right hip pain EXAM: CHEST  1 VIEW COMPARISON:  08/22/2022 FINDINGS: Stable cardiomediastinal silhouette. No focal consolidation, pleural effusion, or pneumothorax. No definite displaced rib fractures. IMPRESSION: No active disease. Electronically Signed   By: Minerva Fester M.D.   On: 09/19/2022 01:06   DG Hip Unilat With Pelvis 2-3 Views Right  Result Date: 09/19/2022 CLINICAL DATA:  Right hip pain after fall EXAM: DG HIP (WITH OR WITHOUT PELVIS) 2-3V RIGHT COMPARISON:  Radiographs 08/22/2022 FINDINGS: Mildly displaced right intertrochanteric fracture. No dislocation. Left THA. IMPRESSION: Mildly displaced right intertrochanteric fracture. Electronically Signed   By: Minerva Fester M.D.   On: 09/19/2022 01:04      IMPRESSION AND PLAN:  Assessment and Plan: * Closed right hip fracture (HCC) - This is secondary to a mechanical fall. - The patient will be admitted to a medical bed. - Pain management will be provided. - I will stop his Eliquis. - He is kept n.p.o. for now. - Orthopedic consult will be obtained. - Dr. Audelia Acton was notified about the patient and is aware. - The patient has no history of CHF, coronary artery disease or CVA or renal failure with creatinine more than 2 or diabetes mellitus on insulin.  Is considered at average risk for his age for perioperative cardiovascular events, per the revised cardiac risk index.  He has a history of COPD with no active current pulmonary issues.  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  GERD without esophagitis - We will  continue PPI therapy.  Depression - We will continue trazodone.  Chronic obstructive pulmonary disease (HCC) - We will continue his inhalers and place him on as needed albuterol.  Schizophrenia (HCC) - We will continue his Haldol and Cogentin.   DVT prophylaxis: SCDs. Advanced Care Planning:  Code Status: full code. Family Communication:  The plan of care was discussed in details with the patient (and family). I answered all questions. The patient agreed to proceed with the above mentioned plan. Further management will depend upon hospital course. Disposition Plan: Back to previous home environment Consults called: Ortho All the records are reviewed and case discussed with ED provider.  Status is: Inpatient  At the time of the admission, it appears that the appropriate admission status for this patient is inpatient.  This is judged to be reasonable and necessary in order to provide the required intensity of service to ensure the patient's safety given the presenting symptoms,  physical exam findings and initial radiographic and laboratory data in the context of comorbid conditions.  The patient requires inpatient status due to high intensity of service, high risk of further deterioration and high frequency of surveillance required.  I certify that at the time of admission, it is my clinical judgment that the patient will require inpatient hospital care extending more than 2 midnights.                            Dispo: The patient is from: Home              Anticipated d/c is to: Home              Patient currently is not medically stable to d/c.              Difficult to place patient: No  Hannah Beat M.D on 09/19/2022 at 6:25 AM  [facility]   From 7 PM-7 AM, contact night-coverage [email or web address]  CC: Primary care physician; Andres Shad, MD

## 2022-09-19 NOTE — Transfer of Care (Signed)
Immediate Anesthesia Transfer of Care Note  Patient: Adrian Gillespie  Procedure(s) Performed: INTRAMEDULLARY (IM) NAIL INTERTROCHANTERIC (Right)  Patient Location: PACU  Anesthesia Type:General  Level of Consciousness: drowsy  Airway & Oxygen Therapy: Patient Spontanous Breathing and Patient connected to face mask oxygen  Post-op Assessment: Report given to RN and Post -op Vital signs reviewed and stable  Post vital signs: Reviewed  Last Vitals:  Vitals Value Taken Time  BP 89/63 09/19/22 1657  Temp    Pulse 77 09/19/22 1658  Resp 13 09/19/22 1658  SpO2 98 % 09/19/22 1658  Vitals shown include unfiled device data.  Last Pain:  Vitals:   09/19/22 1314  TempSrc: Oral  PainSc:          Complications: No notable events documented.

## 2022-09-19 NOTE — Anesthesia Procedure Notes (Signed)
Procedure Name: Intubation Date/Time: 09/19/2022 4:06 PM  Performed by: Maryla Morrow., CRNAPre-anesthesia Checklist: Patient identified, Patient being monitored, Timeout performed, Emergency Drugs available and Suction available Patient Re-evaluated:Patient Re-evaluated prior to induction Oxygen Delivery Method: Circle system utilized Preoxygenation: Pre-oxygenation with 100% oxygen Induction Type: IV induction, Rapid sequence and Cricoid Pressure applied Ventilation: Mask ventilation without difficulty Laryngoscope Size: McGraph and 4 Grade View: Grade I Tube type: Oral Tube size: 7.5 mm Number of attempts: 1 Airway Equipment and Method: Stylet Placement Confirmation: ETT inserted through vocal cords under direct vision, positive ETCO2 and breath sounds checked- equal and bilateral Secured at: 21 cm Tube secured with: Tape Dental Injury: Teeth and Oropharynx as per pre-operative assessment

## 2022-09-19 NOTE — Assessment & Plan Note (Addendum)
-   This is secondary to a mechanical fall. Not sure about anticoagulation-pharmacy to verify.  But we will hold at this time for upcoming ORIF today.  Orthopedic is on board. -Continue with pain management -Follow-up postsurgical recommendations -Continue with supportive care

## 2022-09-19 NOTE — Progress Notes (Signed)
Progress Note   Patient: Adrian Gillespie:[id number] DOB: 10-26-1950 DOA: 09/19/2022     0 DOS: the patient was seen and examined on 09/19/2022   Brief hospital course: Taken from H&P.  Adrian Gillespie is a 72 y.o. Caucasian male with medical history significant for COPD, GERD, type pretension, dyslipidemia, schizophrenia, anxiety and depression, presented to the emergency room with acute onset of witnessed accidental mechanical fall with subsequent right hip pain.   On arrival hemodynamically stable, hip x-ray with a right mildly displaced intertrochanteric fracture.  Patient was on Eliquis which was held. Orthopedic was consulted.  8/23: Vital stable, hemoglobin stable as compared to admission at 10.7, decreased from 4-week check of 12.3.  Iron studies done last month by PCP with concern of anemia of chronic disease with some iron deficiency, patient was asked to have colonoscopy as there is an history of prior polyp, not done yet.  Patient was not sure whether he was taking Eliquis or not-pharmacy to verify.  Orthopedic is taking him to the OR later today for ORIF. Patient is from an assisted living with memory care unit.   Assessment and Plan: * Closed right hip fracture (HCC) - This is secondary to a mechanical fall. Not sure about anticoagulation-pharmacy to verify.  But we will hold at this time for upcoming ORIF today.  Orthopedic is on board. -Continue with pain management -Follow-up postsurgical recommendations -Continue with supportive care  Chronic obstructive pulmonary disease (HCC) Stable.  No concern of exacerbation at this time - We will continue his inhalers and place him on as needed albuterol.  AF (paroxysmal atrial fibrillation) (HCC) Currently in normal sinus rhythm, not on any rate control medication or other A-fib related medications at home.  Not sure whether he was taking Eliquis but it was listed in his home medications-pharmacy to verify -Holding Eliquis for  surgery -We can resume once cleared from orthopedic  Schizophrenia (HCC) - We will continue his Haldol and Cogentin.  Depression - We will continue trazodone.  GERD without esophagitis - We will continue PPI therapy.  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      Subjective: Patient was having right hip pain when seen today.  He does not remember what medications he normally takes.  Oriented to self only  Physical Exam: Vitals:   09/19/22 1020 09/19/22 1037 09/19/22 1230 09/19/22 1314  BP:   (!) 139/94 (!) 148/106  Pulse:   (!) 109 (!) 110  Resp:   16 18  Temp:  98.5 F (36.9 C) 98.1 F (36.7 C) 98.6 F (37 C)  TempSrc:  Oral Oral Oral  SpO2:   96% 100%  Height: 5\' 9"  (1.753 m)      General.  Malnourished elderly man, in no acute distress. Pulmonary.  Lungs clear bilaterally, normal respiratory effort. CV.  Regular rate and rhythm, no JVD, rub or murmur. Abdomen.  Soft, nontender, nondistended, BS positive. CNS.  Alert and oriented to self only.  No focal neurologic deficit. Extremities.  No edema, no cyanosis, pulses intact and symmetrical. Psychiatry.  Appears to have cognitive impairment  Data Reviewed: Prior data reviewed  Family Communication: Discussed with sister on phone  Disposition: Status is: Inpatient Remains inpatient appropriate because: Severity of illness  Planned Discharge Destination: Skilled nursing facility  Time spent:  minutes  This record has been created using Conservation officer, historic buildings. Errors have been sought and corrected,but may not always be located. Such  creation errors do not reflect on the standard of care.   Author: Arnetha Courser, MD 09/19/2022 1:55 PM  For on call review [email or web address].

## 2022-09-19 NOTE — ED Triage Notes (Signed)
Pt presents to ER via ems from the [facility] with c/o witnessed mechanical fall tonight.  Pt was reportedly walking down hall, tripped and fell onto his right side.  Pt c/o pain to right hip/femur on arrival.  Pt did not hit head, have any LOC, and is not on blood thinners.  Pt has hx of dementia, but is otherwise at his normal mental status per ems.  Pt is otherwise A&Ox2 and in NAD at this time.  Pt received 50 mcg fentanyl PTA w/ems 22g PIV LFA

## 2022-09-19 NOTE — Progress Notes (Signed)
Patient more awake, alert to name only, denies pain when questioned. Vitals stable, afebrile, right thigh/leg dressing x2 c/d/I. Family updated. Report given to accepting nurse.

## 2022-09-19 NOTE — ED Notes (Signed)
RN came in room and saw that pt ripped out PIV and took off cardiac monitoring.

## 2022-09-19 NOTE — Assessment & Plan Note (Signed)
-   We will continue trazodone.

## 2022-09-19 NOTE — Assessment & Plan Note (Signed)
-   We will continue his Haldol and Cogentin.

## 2022-09-19 NOTE — Assessment & Plan Note (Signed)
-   We will continue PPI therapy 

## 2022-09-19 NOTE — Anesthesia Preprocedure Evaluation (Signed)
Anesthesia Evaluation  Patient identified by MRN, date of birth, ID band Patient awake    Reviewed: Allergy & Precautions, H&P , NPO status , Patient's Chart, lab work & pertinent test results, reviewed documented beta blocker date and time   Airway Mallampati: III   Neck ROM: full    Dental  (+) Poor Dentition   Pulmonary asthma , pneumonia, resolved, COPD, Current Smoker   Pulmonary exam normal        Cardiovascular Exercise Tolerance: Poor hypertension, On Medications negative cardio ROS Normal cardiovascular exam Rhythm:regular Rate:Normal     Neuro/Psych  PSYCHIATRIC DISORDERS Anxiety Depression  Schizophrenia  negative neurological ROS     GI/Hepatic Neg liver ROS,GERD  Medicated,,  Endo/Other  negative endocrine ROS    Renal/GU negative Renal ROS  negative genitourinary   Musculoskeletal   Abdominal   Peds  Hematology negative hematology ROS (+)   Anesthesia Other Findings Past Medical History: No date: Anxiety No date: At risk for sexually transmitted disease due to partner with  HIV No date: COPD (chronic obstructive pulmonary disease) (HCC) No date: Depression No date: GERD (gastroesophageal reflux disease) No date: 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 No pertinent surgical history. BMI    Body Mass Index: 24.51 kg/m     Reproductive/Obstetrics negative OB ROS                             Anesthesia Physical Anesthesia Plan  ASA: 3 and emergent  Anesthesia Plan: General ETT   Post-op Pain Management:    Induction:   PONV Risk Score and Plan: 2  Airway Management Planned:   Additional Equipment:   Intra-op Plan:   Post-operative Plan:   Informed Consent: I have reviewed the patients History and Physical, chart, labs and discussed the procedure including the  risks, benefits and alternatives for the proposed anesthesia with the patient or authorized representative who has indicated his/her understanding and acceptance.     Dental Advisory Given  Plan Discussed with: CRNA  Anesthesia Plan Comments:        Anesthesia Quick Evaluation

## 2022-09-19 NOTE — ED Provider Notes (Signed)
[facility] Provider Note    Event Date/Time   First MD Initiated Contact with Patient 09/19/22 0022     (approximate)   History   Fall   HPI  Adrian Gillespie is a 72 y.o. male brought to the ED via EMS from SNF with a chief complaint of witnessed fall with right hip pain.  Staff witnessed patient stumble and fall directly onto his right hip.  History of COPD, hypertension, schizophrenia, dementia.  Not on anticoagulation per EMS.  Denies headache, neck pain, chest pain, shortness of breath, abdominal pain, nausea, vomiting or dizziness.  Received 50 mcg Fentanyl by EMS prior to arrival.     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   Femur fracture, left (HCC) 02/02/2020   AF (paroxysmal atrial fibrillation) (HCC) 02/02/2020   Malnutrition of moderate degree 05/26/2019   Acute respiratory failure with hypoxemia (HCC) 05/26/2019   CAP (community acquired pneumonia) 04/20/2019   PVC (premature ventricular contraction) 04/20/2019   GERD (gastroesophageal reflux disease) 04/20/2019   Acute on chronic respiratory failure with hypoxia (HCC)    HTN (hypertension)    Chronic obstructive pulmonary disease (HCC)    NSVT (nonsustained ventricular tachycardia) (HCC)    Acute respiratory failure with hypoxia (HCC) 04/02/2019   Hypokalemia 04/02/2019   Schizophrenia (HCC) 04/02/2019     Past Surgical History  History reviewed. No pertinent surgical  history.   Home Medications   Prior to Admission medications   Medication Sig Start Date End Date Taking? Authorizing Provider  acetaminophen (TYLENOL) 325 MG tablet Take 2 tablets (650 mg total) by mouth every 6 (six) hours as needed for mild pain or moderate pain. 02/07/20   Alford Highland, MD  ADVAIR HFA [phone] MCG/ACT inhaler Inhale 2 puffs into the lungs 2 (two) times daily. 01/10/20   [provider]  albuterol (VENTOLIN HFA) 108 (90 Base) MCG/ACT inhaler Inhale 2 puffs into the lungs every 6 (six) hours as needed for shortness of breath or wheezing. 04/05/19   Dhungel, Theda Belfast, MD  apixaban (ELIQUIS) 5 MG TABS tablet Take 1 tablet (5 mg total) by mouth 2 (two) times daily. 04/23/19   Rolly Salter, MD  benzonatate (TESSALON) 100 MG capsule Take 1 capsule (100 mg total) by mouth 3 (three) times daily. Patient taking differently: Take 100 mg by mouth 3 (three) times daily as needed. 04/23/19   Rolly Salter, MD  benztropine (COGENTIN) 1 MG tablet Take 1 mg by mouth daily.    [provider]  BEVESPI AEROSPHERE 9-4.8 MCG/ACT AERO Inhale 2 puffs into the lungs at bedtime.  03/31/19   [provider]  feeding supplement, ENSURE ENLIVE, (ENSURE ENLIVE) LIQD Take 237 mLs by mouth 2 (two) times daily between meals. 05/26/19   Dhungel, Theda Belfast, MD  haloperidol (HALDOL) 10 MG tablet Take 10 mg by mouth 2 (two) times daily.    [provider]  hydrOXYzine (VISTARIL) 50 MG capsule Take 50 mg by mouth 2 (two) times daily as needed for anxiety.  04/14/19   [provider]  INVEGA SUSTENNA 234 MG/1.5ML SUSY injection Inject 234 mg into the muscle every 30 (thirty) days. 04/18/19   [provider]  montelukast (SINGULAIR) 10 MG tablet Take 10 mg by mouth daily. 11/25/19   [provider]  naloxone [facility]) nasal spray 4 mg/0.1 mL One spray in nostril for opioid reversal if unresponsive 02/07/20   Alford Highland, MD  nicotine (NICODERM CQ - DOSED  IN MG/24 HOURS) 21 mg/24hr patch Place 1 patch (21 mg total) onto the skin daily. 05/26/19   Dhungel, Theda Belfast, MD  omeprazole (PRILOSEC) 20 MG capsule Take 20 mg by mouth daily.    [provider]  oxyCODONE (OXY IR/ROXICODONE) 5 MG immediate release tablet Take 1 tablet (5 mg total) by mouth every 8 (eight) hours as needed for severe pain. 02/07/20   Wieting, Richard, MD  polyethylene glycol (MIRALAX / GLYCOLAX) 17 g packet Take 17 g by mouth daily. 02/07/20   Alford Highland, MD  tiotropium (SPIRIVA HANDIHALER) 18 MCG inhalation capsule Place 1 capsule (18 mcg total) into inhaler and inhale daily. 04/05/19   Dhungel, Theda Belfast, MD  traZODone (DESYREL) 100 MG tablet Take 300 mg by mouth at bedtime.    [provider]     Allergies  Patient has no known allergies.   Family History   Family History  Problem Relation Age of Onset   Arthritis/Rheumatoid Mother      Physical Exam  Triage Vital Signs: ED Triage Vitals  Encounter Vitals Group     BP      Systolic BP Percentile      Diastolic BP Percentile      Pulse      Resp      Temp      Temp src      SpO2      Weight      Height      Head Circumference      Peak Flow      Pain Score      Pain Loc      Pain Education      Exclude from Growth Chart     Updated Vital Signs: BP 127/87 (BP Location: Right Arm)   Pulse 89   Temp 98.2 F (36.8 C) (Oral)   Resp 19   SpO2 100%    General: Awake, mild distress.  CV:  RRR.  Good peripheral perfusion.  Resp:  Normal effort. CTAB. Abd:  No distention.  Other:  Head is atraumatic.  PERRL.  EOMI.  No midline cervical spine tenderness to palpation.  Pelvis is stable.  Right hip tender to palpation with decreased range of motion secondary to pain.  Mild shortening and rotation noted.  2+ distal pulses.  Brisk, less than 5-second capillary refill.   ED Results / Procedures / Treatments  Labs (all labs ordered are listed, but only abnormal results are  displayed) Labs Reviewed  CBC  BASIC METABOLIC PANEL     EKG  ED ECG REPORT I, Dyson Sevey J, the attending physician, personally viewed and interpreted this ECG.   Date: 09/19/2022  EKG Time: 0030  Rate: 90  Rhythm: normal sinus rhythm  Axis: None  Intervals:none  ST&T Change: Nonspecific    RADIOLOGY I have independently visualized and interpreted patient's x-rays as well as noted the radiology interpretation:  Right hip  x-ray: Mildly displaced right IT fracture  Chest x-ray: No acute cardiopulmonary process  Official radiology report(s): DG Chest 1 View  Result Date: 09/19/2022 CLINICAL DATA:  Fall.  Right hip pain EXAM: CHEST  1 VIEW COMPARISON:  08/22/2022 FINDINGS: Stable cardiomediastinal silhouette. No focal consolidation, pleural effusion, or pneumothorax. No definite displaced rib fractures. IMPRESSION: No active disease. Electronically Signed   By: Minerva Fester M.D.   On: 09/19/2022 01:06   DG Hip Unilat With Pelvis 2-3 Views Right  Result Date: 09/19/2022 CLINICAL DATA:  Right hip pain after fall EXAM: DG HIP (WITH OR WITHOUT PELVIS) 2-3V RIGHT COMPARISON:  Radiographs 08/22/2022 FINDINGS: Mildly displaced right intertrochanteric fracture. No dislocation. Left THA. IMPRESSION: Mildly displaced right intertrochanteric fracture. Electronically Signed   By: Minerva Fester M.D.   On: 09/19/2022 01:04     PROCEDURES:  Critical Care performed: Yes, see critical care procedure note(s)  CRITICAL CARE Performed by: Irean Hong   Total critical care time: 30 minutes  Critical care time was exclusive of separately billable procedures and treating other patients.  Critical care was necessary to treat or prevent imminent or life-threatening deterioration.  Critical care was time spent personally by me on the following activities: development of treatment plan with patient and/or surrogate as well as nursing, discussions with consultants, evaluation of patient's  response to treatment, examination of patient, obtaining history from patient or surrogate, ordering and performing treatments and interventions, ordering and review of laboratory studies, ordering and review of radiographic studies, pulse oximetry and re-evaluation of patient's condition.   Marland Kitchen1-3 Lead EKG Interpretation  Performed by: Irean Hong, MD Authorized by: Irean Hong, MD     Interpretation: normal     ECG rate:  90   ECG rate assessment: normal     Rhythm: sinus rhythm     Ectopy: none     Conduction: normal   Comments:     Patient placed on cardiac monitor to evaluate for arrhythmias    MEDICATIONS ORDERED IN ED: Medications  morphine (PF) 2 MG/ML injection 2 mg (has no administration in time range)     IMPRESSION / MDM / ASSESSMENT AND PLAN / ED COURSE  I reviewed the triage vital signs and the nursing notes.                             72 year old male presenting with witnessed fall with right hip pain.  Differential diagnosis includes but is not limited to fracture, dislocation, musculoskeletal contusion.  I personally reviewed patient's records and note a PCP office visit yesterday for syncope and iron deficiency anemia.  Patient's presentation is most consistent with acute presentation with potential threat to life or bodily function.  The patient is on the cardiac monitor to evaluate for evidence of arrhythmia and/or significant heart rate changes.  Will obtain basic lab work, keep NPO, obtain right hip films and reassess.  Clinical Course as of 09/19/22 0121  Caleen Essex Sep 19, 2022  0113 X-ray demonstrates right intertrochanteric fracture.  Will discuss with orthopedics and consult hospital services for evaluation and admission.  Patient updated. [JS]  0120 Spoke with Dr. Abdomen from orthopedics who request full-length femur films.  I did notice an entry for Eliquis dated 2021 in patient's home medication list.  Will ask pharmacy tech to reconcile medications as  it is currently reported that patient does not take anticoagulants. [JS]    Clinical Course User Index [JS]  Irean Hong, MD     FINAL CLINICAL IMPRESSION(S) / ED DIAGNOSES   Final diagnoses:  Fall, initial encounter  Right hip pain  Closed displaced intertrochanteric fracture of right femur, initial encounter (HCC)     Rx / DC Orders   ED Discharge Orders     None        Note:  This document was prepared using Dragon voice recognition software and may include unintentional dictation errors.   Irean Hong, MD 09/19/22 [phone]

## 2022-09-19 NOTE — Assessment & Plan Note (Signed)
-   We will continue Singulair as well as his inhalers as mentioned above.

## 2022-09-19 NOTE — ED Notes (Signed)
Pt continues to move himself lower and lower and pull on cords. Pt pulled back up and repositioned. Pt reoriented to situation and reeducated.

## 2022-09-19 NOTE — Assessment & Plan Note (Signed)
Currently in normal sinus rhythm, not on any rate control medication or other A-fib related medications at home.  Not sure whether he was taking Eliquis but it was listed in his home medications-pharmacy to verify -Holding Eliquis for surgery -We can resume once cleared from orthopedic

## 2022-09-19 NOTE — Anesthesia Postprocedure Evaluation (Signed)
Anesthesia Post Note  Patient: Arkham Borromeo  Procedure(s) Performed: INTRAMEDULLARY (IM) NAIL INTERTROCHANTERIC (Right)  Patient location during evaluation: PACU Anesthesia Type: General Level of consciousness: sedated Pain management: pain level controlled Vital Signs Assessment: post-procedure vital signs reviewed and stable Respiratory status: spontaneous breathing, nonlabored ventilation, respiratory function stable and patient connected to nasal cannula oxygen Cardiovascular status: blood pressure returned to baseline and stable Postop Assessment: no apparent nausea or vomiting Anesthetic complications: no  No notable events documented.   Last Vitals:  Vitals:   09/19/22 1700 09/19/22 1715  BP: 95/67 96/69  Pulse: 78 73  Resp: 16 13  Temp:    SpO2: 97% 100%    Last Pain:  Vitals:   09/19/22 1715  TempSrc:   PainSc: Asleep                 Stephanie Coup

## 2022-09-19 NOTE — Assessment & Plan Note (Addendum)
Stable.  No concern of exacerbation at this time - We will continue his inhalers and place him on as needed albuterol.

## 2022-09-19 NOTE — Progress Notes (Signed)
Initial Nutrition Assessment  DOCUMENTATION CODES:   Not applicable  INTERVENTION:   -Obtain new wt -Once diet is advanced, add:   -Ensure Enlive po BID, each supplement provides 350 kcal and 20 grams of protein.  -MVI with minerals daily   NUTRITION DIAGNOSIS:   Increased nutrient needs related to post-op healing as evidenced by estimated needs.  GOAL:   Patient will meet greater than or equal to 90% of their needs  MONITOR:   PO intake, Supplement acceptance, Diet advancement  REASON FOR ASSESSMENT:   Consult Assessment of nutrition requirement/status, Hip fracture protocol  ASSESSMENT:   Pt with medical history significant for COPD, GERD, type pretension, dyslipidemia, schizophrenia, anxiety and depression, presented with acute onset of witnessed accidental mechanical fall with subsequent right hip pain.  Pt admitted with rt femur fracture s/p fall.   Pt unavailable at time of visit. RD unable to obtain further nutrition-related history or complete nutrition-focused physical exam at this time.     Per orthopedics notes, plan for surgery today. Pt is currently NPO for procedure.   Per MAR PTA< pt was receiving Ensure supplements. Pt has increased nutritional needs for post-op healing and would benefit from addition of nutritional supplements. Will resume Ensure post-operatively.   Reviewed wt hx; wt has been stable over the past several years. No new wt since 08/22/22.  Medications reviewed and include miralax and 0.9% sodium chloride infusion @ 100 ml/hr.   Lab Results  Component Value Date   HGBA1C 5.8 (H) 05/24/2019   PTA DM medications are none.   Labs reviewed: K: 3.2, CBGS: 135 (inpatient orders for glycemic control are none).    Diet Order:   Diet Order             Diet NPO time specified  Diet effective now                   EDUCATION NEEDS:   No education needs have been identified at this time  Skin:  Skin Assessment: Reviewed RN  Assessment  Last BM:  Unknown  Height:   Ht Readings from Last 1 Encounters:  09/19/22 5\' 9"  (1.753 m)    Weight:   Wt Readings from Last 1 Encounters:  08/22/22 75.3 kg    Ideal Body Weight:  72.7 kg  BMI:  Body mass index is 24.51 kg/m.  Estimated Nutritional Needs:   Kcal:  1850-2050  Protein:  105-120 grams  Fluid:  > 1.8 L    Levada Schilling, RD, LDN, CDCES Registered Dietitian II Certified Diabetes Care and Education Specialist Please refer to [facility] for RD and/or RD on-call/weekend/after hours pager

## 2022-09-19 NOTE — Op Note (Signed)
Patient Name: Adrian Gillespie  NWG:[id number]  Pre-Operative Diagnosis: Right hip Intertrochanteric fracture  Post-Operative Diagnosis: (same)  Procedure: Right Hip Intramedullary nail   Components/Implants: Nail:TFNA x58mmx130 deg  Lag Blade : Locking Screw:5x101mm  Date of Surgery: 09/19/2022  Surgeon: Reinaldo Berber MD  Assistant: None  Anesthesiologist: Lorette Ang  Anesthesia: General   EBL: 50cc  IVF: 1000cc  Complications: None   Brief history: The patient is a 72 year old male who presented to the [facility] emergency room after a fall and found to have a  right hip intertrochanteric fracture.  The patient was admitted by the medical team and optimized for surgery.  A thorough discussion was had with the patient and family about the risks and benefits of surgical intervention for their hip fracture as definitive treatment.  The patient and family opted to proceed with the operation.  All preoperative films were reviewed and an appropriate surgical plan was made prior to surgery.   Description of procedure: The patient was brought to the operating room where laterality was confirmed by all those present to be the right side.  The patient was administered anesthesia on a stretcher prior to being moved supine on the operating room table. Patient was given an intravenous dose of antibiotics for surgical prophylaxis and TXA.  All bony prominences and extremities were well padded and the patient was securely attached to the table boots, a perineal post was placed and the patient had a safety strap placed.  Surgical site was prepped with alcohol and chlorhexidine. The surgical site over the hip was and draped in typical sterile fashion with multiple layers of adhesive and nonadhesive drapes.  The incision site was marked out with a sterile marker under fluoroscopic guidance.    A surgical timeout was then called with participation of all staff in the room  the patient was then a confirmed again and laterality confirmed. The hip fracture was reduced through indirect measures with traction and rotation of the leg on the fracture table. After an acceptable reduction was obtained on AP and lateral images the procedure started.  An incision was made just proximal to the greater trochanter through the skin subcutaneous tissues and an incision was made in the glut max fascia.  A guidewire was placed through the greater trochanter at the tip under x-ray guidance into the intertrochanteric region.  The position of this wire was assessed on AP and lateral fluoroscopic images to ensure position.  An opening reamer was used to create access at the tip of the greater trochanter under fluoroscopic guidance.   A size 11mm x162mm nail was advanced through the reamed hole in the proximal femur and seated within the femur to an appropriate depth under fluoroscopic guidance.  The aiming jig was used to mark an incision site for a lag blade to the femoral head which was then incised with a scalpel.  A wire was then advanced through the lateral cortex of the femur into the center of the femoral head on both AP and lateral fluoroscopic imaging stopping at the subchondral bone without penetration of the femoral head.  This wire was then used to measure for a lag screw and a size lag blade was inserted into the femoral head through the lateral cortex of the femur and the nail under fluoroscopic guidance.  The setscrew was then tightened in the proximal part of the nail and engaged with the lag blade with a small amount of play left so as to allow  compression of the fracture.   The lag screw driver was removed and the aiming jig was switched for the distal interlocking screw.  A drill was used to drill a hole for the distal interlocking screw under fluoroscopic guidance and a size 36mm screw was placed.  The intramedullary nail was assessed on AP and lateral fluoroscopic guidance  prior to removal of the aiming jig.  Final fluoroscopic x-rays were then taken after removal of the jig.  The nail was found to be in appropriate position on AP and lateral imaging with appropriate lengths of both the lag screw in the distal locking screw.  The fascia was closed with 0 Vicryl interrupted figure-of-eight sutures.  The subcutaneous tissues were closed with 2-0 Vicryl and the skin closed with 3-0 Monocryl and Dermabond.  Sterile dressings were applied to the incisions.   The patient was awoken from anesthesia transferred off of the operating room table onto a hospital bed.  The patient had a good pulse postoperatively in the foot . the patient was then transferred to the PACU in stable condition.

## 2022-09-19 NOTE — Hospital Course (Addendum)
Taken from H&P.  Adrian Gillespie is a 72 y.o. Caucasian male with medical history significant for COPD, GERD, type pretension, dyslipidemia, schizophrenia, anxiety and depression, presented to the emergency room with acute onset of witnessed accidental mechanical fall with subsequent right hip pain.   On arrival hemodynamically stable, hip x-ray with a right mildly displaced intertrochanteric fracture.  Patient was on Eliquis which was held. Orthopedic was consulted.  8/23: Vital stable, hemoglobin stable as compared to admission at 10.7, decreased from 4-week check of 12.3.  Iron studies done last month by PCP with concern of anemia of chronic disease with some iron deficiency, patient was asked to have colonoscopy as there is an history of prior polyp, not done yet.   Orthopedic is taking him to the OR later today for ORIF. Patient was not on any anticoagulation at this time-verified by pharmacy from his facility Patient is from an assisted living with memory care unit.  8/24: Labs and vital stable.  Unable to void after the surgery requiring In-N-Out catheter with drainage of more than 600 mL of urine.  Patient is incontinent at baseline.  Lives in a memory care unit.  Need to go to rehab.  8/25: Vital stable.  Still unable to void so Foley catheter was placed overnight.  Patient was also started on Flomax and likely be discharged with Foley catheter with outpatient urology follow-up. Hemoglobin with further decreased to 9.4 and CO2 of 20, mild to be due to continuation of normal saline which was discontinued.  8/26: Hemodynamically stable.  Accidentally pulled his Foley catheter out yesterday, no injuries, apparently the balloon was not inflated.  Able to void once after that.  Need to monitor with bladder scan for any significant retention. Medically stable-awaiting disposition.  8/27: Remain medically stable.  Able to void now.  Got insurance approval for rehab where he is being discharged for  further management.  Patient will continue with Flomax for concern of BPH causing urinary obstruction.  Patient will continue on current medications and need to have a close follow-up with his providers for further care.

## 2022-09-19 NOTE — ED Notes (Signed)
ED Provider at bedside. 

## 2022-09-19 NOTE — Consult Note (Signed)
ORTHOPAEDIC CONSULTATION  REQUESTING PHYSICIAN: Arnetha Courser, MD  Chief Complaint:   Right intertrochanteric hip fracture  History of Present Illness: Adrian Gillespie is a 72 y.o. male brought to the ED via EMS from SNF with a chief complaint of witnessed fall with right hip pain. Staff witnessed patient stumble and fall directly onto his right hip. History of COPD, hypertension, schizophrenia, dementia.  The patient was found to have a right intertrochanteric femur fracture.  I discussed the case with the patient's sister Scarlette Calico who is his DURABLE POWER OF ATTORNEY and helps with and makes medical decisions with the patient.  He denied any pre-existing hip pain prior to the fall.  At this time he denies any chest pain, shortness of breath, fevers, chills.  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 No pertinent surgical history. Social History   Socioeconomic History   Marital status: Single    Spouse name: Not on file   Number of children: Not on file   Years of education: Not on file   Highest education level: Not on file  Occupational History   Not on file  Tobacco Use   Smoking status: Every Day    Current packs/day: 0.50    Types: Cigarettes   Smokeless tobacco: Never   Tobacco comments:    quit 1 month ago  Vaping Use   Vaping status: Never Used  Substance and Sexual Activity   Alcohol use: Not Currently   Drug use: Never   Sexual activity: Not on file  Other Topics Concern   Not on file  Social History Narrative   Not on file   Social Determinants of Health   Financial Resource Strain: Medium Risk  (06/26/2022)   Received from [facility], [facility]   Overall Financial Resource Strain (CARDIA)    Difficulty of Paying Living Expenses: Somewhat hard  Food Insecurity: No Food Insecurity (06/26/2022)   Received [facility], [facility]   Hunger Vital Sign    Worried About Running Out of Food in the Last Year: Never true    Ran Out of Food in the Last Year: Never true  Transportation Needs: No Transportation Needs (06/26/2022)   Received from [facility], [facility]   [facility] - Transportation    Lack of Transportation (Medical): No    Lack of Transportation (Non-Medical): No  Physical Activity: Sufficiently Active (06/26/2022)   Received from [facility], [facility]   Exercise Vital Sign    Days of Exercise per Week: 6 days    Minutes of Exercise per Session: 60 min  Stress: No Stress Concern Present (06/26/2022)   Received from [facility], [facility] - Occupational Stress Questionnaire    Feeling of Stress : Only a little  Social Connections: Moderately Isolated (07/18/2020)   Received from [facility], [facility]   Social Connection and Isolation Panel [NHANES]    Frequency of Communication with Friends and Family: More than three times a week    Frequency of Social Gatherings with Friends and  Family: Never    Attends Religious Services: Never    Database administrator or Organizations: No    Attends Engineer, structural: Never    Marital Status: Living with partner   Family History  Problem Relation Age of Onset   Arthritis/Rheumatoid Mother    No Known Allergies Prior to Admission medications   Medication Sig Start Date End Date Taking? Authorizing Provider  ADVAIR HFA [phone] MCG/ACT inhaler Inhale 2 puffs into the lungs 2 (two) times daily. 01/10/20  Yes [provider]  benztropine (COGENTIN) 1 MG tablet Take 1 mg by mouth daily.   Yes [provider]  BEVESPI AEROSPHERE 9-4.8 MCG/ACT AERO Inhale 2 puffs into the lungs at bedtime.  03/31/19  Yes [provider]  haloperidol (HALDOL) 10 MG tablet Take 10 mg by mouth 2 (two) times daily.   Yes [provider]  hydrOXYzine (VISTARIL) 50 MG capsule Take 50 mg by mouth 2 (two) times daily as needed for anxiety.  04/14/19  Yes [provider]  INVEGA SUSTENNA 234 MG/1.5ML SUSY injection Inject 234 mg into the muscle every 30 (thirty) days. 04/18/19  Yes [provider]  montelukast (SINGULAIR) 10 MG tablet Take 10 mg by mouth daily. 11/25/19  Yes [provider]  naloxone [facility]) nasal spray 4 mg/0.1 mL One spray in nostril for opioid reversal if unresponsive 02/07/20  Yes Wieting, Richard, MD  nicotine (NICODERM CQ - DOSED IN MG/24 HOURS) 21 mg/24hr patch Place 1 patch (21 mg total) onto the skin daily. 05/26/19  Yes Dhungel, Nishant, MD  omeprazole (PRILOSEC) 20 MG capsule Take 20 mg by mouth daily.   Yes [provider]  oxyCODONE (OXY IR/ROXICODONE) 5 MG immediate release tablet Take 1 tablet (5 mg total) by mouth every 8 (eight) hours as needed for severe pain. 02/07/20  Yes Wieting, Richard, MD  polyethylene glycol (MIRALAX / GLYCOLAX) 17 g packet Take 17 g by mouth daily. 02/07/20  Yes Wieting, Richard, MD  tiotropium (SPIRIVA HANDIHALER) 18 MCG inhalation capsule Place 1 capsule (18 mcg total) into inhaler and inhale daily. 04/05/19  Yes Dhungel, Nishant, MD  traZODone (DESYREL) 100 MG tablet Take 300 mg by mouth at bedtime.   Yes [provider]  acetaminophen (TYLENOL) 325 MG tablet Take 2 tablets (650 mg total) by mouth every 6 (six) hours as needed for mild pain or moderate pain. 02/07/20   Alford Highland, MD  albuterol (VENTOLIN HFA) 108 (90 Base) MCG/ACT inhaler Inhale 2 puffs into the lungs every 6 (six) hours as needed for shortness of breath or wheezing. 04/05/19   Dhungel, Theda Belfast, MD  apixaban (ELIQUIS) 5 MG TABS tablet Take 1  tablet (5 mg total) by mouth 2 (two) times daily. 04/23/19   Rolly Salter, MD  benzonatate (TESSALON) 100 MG capsule Take 1 capsule (100 mg total) by mouth 3 (three) times daily. Patient not taking: Reported on 09/19/2022 04/23/19   Rolly Salter, MD  feeding supplement, ENSURE ENLIVE, (ENSURE ENLIVE) LIQD Take 237 mLs by mouth 2 (two) times daily between meals. 05/26/19   Eddie North, MD   DG Femur Min 2 Views Right  Result Date: 09/19/2022 CLINICAL DATA:  Femur x-rays requested by orthopedics. Intertrochanteric fracture. Right hip pain after fall EXAM: RIGHT FEMUR 2 VIEWS COMPARISON:  None Available. FINDINGS: No fracture in the distal right femur. Right knee osteoarthritis. Unremarkable soft tissues. IMPRESSION: No acute fracture in the distal femur. Electronically Signed   By: Joselyn Glassman  Stutzman M.D.   On: 09/19/2022 01:47   DG Chest 1 View  Result Date: 09/19/2022 CLINICAL DATA:  Fall.  Right hip pain EXAM: CHEST  1 VIEW COMPARISON:  08/22/2022 FINDINGS: Stable cardiomediastinal silhouette. No focal consolidation, pleural effusion, or pneumothorax. No definite displaced rib fractures. IMPRESSION: No active disease. Electronically Signed   By: Minerva Fester M.D.   On: 09/19/2022 01:06   DG Hip Unilat With Pelvis 2-3 Views Right  Result Date: 09/19/2022 CLINICAL DATA:  Right hip pain after fall EXAM: DG HIP (WITH OR WITHOUT PELVIS) 2-3V RIGHT COMPARISON:  Radiographs 08/22/2022 FINDINGS: Mildly displaced right intertrochanteric fracture. No dislocation. Left THA. IMPRESSION: Mildly displaced right intertrochanteric fracture. Electronically Signed   By: Minerva Fester M.D.   On: 09/19/2022 01:04    Positive ROS: All other systems have been reviewed and were otherwise negative with the exception of those mentioned in the HPI and as above.  Physical Exam: General:  Alert, no acute distress Psychiatric:  Patient is oriented to person and that he is in a hospital otherwise not competent for  consent at this time Cardiovascular:  No pedal edema Respiratory:  No wheezing, non-labored breathing GI:  Abdomen is soft and non-tender Skin:  No lesions in the area of chief complaint Neurologic:  Sensation intact distally Lymphatic:  No axillary or cervical lymphadenopathy  Orthopedic Exam:  Right lower extremity Skin intact tenderness palpation over the hip No tenderness palpation over the distal femur or knee, tibia, ankle or foot Able to dorsiflex and plantarflex the foot and toes with compartments soft Neurovascular intact to the foot with sensation intact and good dorsalis pedis pulse  Secondary survey No tenderness to palpation over other bony prominences in the lower extremities or bilateral upper extremities No pain with logroll or simulated axial loading of the left lower extremity All compartments soft No tenderness to palpation over the cervical or thoracic spine, no bony step-off Motor grossly intact throughout, no focal deficits Sensation grossly intact throughout, no focal deficits Good distal pulses and capillary refill on all extremities   X-rays:  X-rays and reports reviewed by myself which show a right hip intertrochanteric femur fracture with minimal displacement.  No other fractures noted in the femur or pelvis.  Agree with radiologist interpretation.  Assessment: Right intertrochanteric femur fracture  Plan: Augustus is a 72 year old male presents with a right intertrochanteric femur fracture.  I had an extensive conversation with the patient and his sister who is his power of attorney.  We discussed treatment options including operative and nonoperative management and under shared decision making model after discussing risks and benefits the patient and sister agreed with the plan to proceed with preparations for a surgical nerve engine on his right hip.  The patient will need a right hip intramedullary nail.  A long discussion took place with the patient  describing what a intramedullary nail is and what the procedure would entail. The xrays were reviewed with the patient and the implants were discussed. The ability to secure the implant utilizing screw and blade fixation was discussed. Surgical exposures were discussed with the patient.    The hospitalization and post-operative care and rehabilitation were also discussed. The use of perioperative antibiotics and DVT prophylaxis were discussed. The risk, benefits and alternatives to a surgical intervention were discussed at length with the patient. The patient was also advised of risks related to the medical comorbidities. A lengthy discussion took place to review the most common complications including but not limited  to: deep vein thrombosis, pulmonary embolus, heart attack, stroke, infection, wound breakdown, dislocation, numbness, leg length in-equality, damage to nerves, intraoperative fracture, implant cut out, implant breakage, malunion/ nonunion, tendon,muscles, arteries or other blood vessels, death and other possible complications from anesthesia. The patient was told that we will take steps to minimize these risks by using sterile technique, antibiotics and DVT prophylaxis when appropriate and follow the patient postoperatively in the office setting to monitor progress. The possibility of recurrent pain, no improvement in pain and actual worsening of pain were also discussed with the patient.      The benefits of surgery were discussed with the patient including the potential for improving the patient's current clinical condition through operative intervention. Alternatives to surgical intervention including conservative management were also discussed in detail. All questions were answered to the satisfaction of the patient and his sister. The patient and his sister participated and agreed to the plan of care as well as the use of the recommended implants for their surgery.    Plan for surgery today  09/19/2022 hopefully around 3 PM N.p.o. for the operating room Hold anticoagulation for the operating room I will follow-up consent with the sister in the preoperative holding area    Reinaldo Berber MD  Beeper #:  [phone]  09/19/2022 8:44 AM

## 2022-09-20 DIAGNOSIS — J449 Chronic obstructive pulmonary disease, unspecified: Secondary | ICD-10-CM | POA: Diagnosis not present

## 2022-09-20 DIAGNOSIS — S72001A Fracture of unspecified part of neck of right femur, initial encounter for closed fracture: Secondary | ICD-10-CM | POA: Diagnosis not present

## 2022-09-20 DIAGNOSIS — I480 Paroxysmal atrial fibrillation: Secondary | ICD-10-CM | POA: Diagnosis not present

## 2022-09-20 DIAGNOSIS — F209 Schizophrenia, unspecified: Secondary | ICD-10-CM | POA: Diagnosis not present

## 2022-09-20 LAB — BASIC METABOLIC PANEL WITH GFR
Anion gap: 6 (ref 5–15)
BUN: 10 mg/dL (ref 8–23)
CO2: 21 mmol/L — ABNORMAL LOW (ref 22–32)
Calcium: 8 mg/dL — ABNORMAL LOW (ref 8.9–10.3)
Chloride: 109 mmol/L (ref 98–111)
Creatinine, Ser: 0.8 mg/dL (ref 0.61–1.24)
GFR, Estimated: >60 mL/min
Glucose, Bld: 109 mg/dL — ABNORMAL HIGH (ref 70–99)
Potassium: 4.1 mmol/L (ref 3.5–5.1)
Sodium: 136 mmol/L (ref 135–145)

## 2022-09-20 LAB — CBC
HCT: 32.4 % — ABNORMAL LOW (ref 39.0–52.0)
Hemoglobin: 10 g/dL — ABNORMAL LOW (ref 13.0–17.0)
MCH: 23.1 pg — ABNORMAL LOW (ref 26.0–34.0)
MCHC: 30.9 g/dL (ref 30.0–36.0)
MCV: 74.8 fL — ABNORMAL LOW (ref 80.0–100.0)
Platelets: 301 10*3/uL (ref 150–400)
RBC: 4.33 MIL/uL (ref 4.22–5.81)
RDW: 18.1 % — ABNORMAL HIGH (ref 11.5–15.5)
WBC: 9.4 10*3/uL (ref 4.0–10.5)
nRBC: 0 % (ref 0.0–0.2)

## 2022-09-20 LAB — HEMOGLOBIN AND HEMATOCRIT, BLOOD
HCT: 34.6 % — ABNORMAL LOW (ref 39.0–52.0)
Hemoglobin: 10.5 g/dL — ABNORMAL LOW (ref 13.0–17.0)

## 2022-09-20 MED ORDER — HYDROCODONE-ACETAMINOPHEN 5-325 MG PO TABS: 1.0000 | ORAL_TABLET | ORAL | 0 refills | Status: DC | PRN

## 2022-09-20 MED ORDER — SODIUM CHLORIDE 0.9 % IV BOLUS
250.0000 mL | Freq: Once | INTRAVENOUS | Status: AC
  Administered 2022-09-20: 250 mL via INTRAVENOUS

## 2022-09-20 NOTE — Assessment & Plan Note (Signed)
-   This is secondary to a mechanical fall. S/p ORIF.  Patient was not on any anticoagulation per facility.  Verified by our pharmacy. -PT is recommending rehab. -Continue with supportive care and pain management -Patient will be weightbearing as tolerated per orthopedic surgery

## 2022-09-20 NOTE — Plan of Care (Signed)
  Problem: Clinical Measurements: Goal: Ability to maintain clinical measurements within normal limits will improve Outcome: Progressing Goal: Will remain free from infection Outcome: Progressing Goal: Diagnostic test results will improve Outcome: Progressing Goal: Respiratory complications will improve Outcome: Progressing Goal: Cardiovascular complication will be avoided Outcome: Progressing   Problem: Pain Managment: Goal: General experience of comfort will improve Outcome: Progressing   Problem: Safety: Goal: Ability to remain free from injury will improve Outcome: Progressing   Problem: Skin Integrity: Goal: Risk for impaired skin integrity will decrease Outcome: Progressing   Problem: Clinical Measurements: Goal: Postoperative complications will be avoided or minimized Outcome: Progressing   Problem: Pain Management: Goal: Pain level will decrease Outcome: Progressing

## 2022-09-20 NOTE — NC FL2 (Signed)
[place] MEDICAID FL2 LEVEL OF CARE FORM     IDENTIFICATION  Patient Name: Adrian Gillespie Birthdate: [birth year] Sex: male Admission Date (Current Location): 09/19/2022  [place] and [place] Number:  Chiropodist and Address:  [facility], [address] [place], [place] [address]      Provider Number: [id number]  Attending Physician Name and Address:  Arnetha Courser, MD  Relative Name and Phone Number:  Ashanti, Packman (Sister)  [phone] [facility] Phone)    Current Level of Care: Hospital Recommended Level of Care: Skilled Nursing Facility Prior Approval Number:    Date Approved/Denied:   PASRR Number: [id number]  Discharge Plan:      Current Diagnoses: Patient Active Problem List   Diagnosis Date Noted   Closed right hip fracture (HCC) 09/19/2022   Depression 09/19/2022   GERD without esophagitis 09/19/2022   Fall 09/19/2022   Closed displaced intertrochanteric fracture of right femur (HCC) 09/19/2022   Right hip pain 09/19/2022   Lethargy    Acquired thrombophilia (HCC)    Intractable pain 02/02/2020   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   Femur fracture, left (HCC) 02/02/2020   AF (paroxysmal atrial fibrillation) (HCC) 02/02/2020   Malnutrition of moderate degree 05/26/2019   Acute respiratory failure with hypoxemia (HCC) 05/26/2019   CAP (community acquired pneumonia) 04/20/2019   PVC (premature ventricular contraction) 04/20/2019   GERD (gastroesophageal reflux disease) 04/20/2019   Acute on chronic respiratory failure with hypoxia (HCC)    HTN (hypertension)    Chronic obstructive pulmonary disease (HCC)    NSVT (nonsustained ventricular tachycardia) (HCC)    Acute respiratory failure with hypoxia (HCC) 04/02/2019   Hypokalemia 04/02/2019   Schizophrenia (HCC) 04/02/2019    Orientation RESPIRATION BLADDER Height & Weight     Self  O2 Incontinent Weight:   Height:  5\' 9"  (175.3 cm)  BEHAVIORAL SYMPTOMS/MOOD NEUROLOGICAL  BOWEL NUTRITION STATUS        Diet (regular)  AMBULATORY STATUS COMMUNICATION OF NEEDS Skin   Extensive Assist Verbally Surgical wounds (R leg)                       Personal Care Assistance Level of Assistance  Bathing, Feeding, Dressing Bathing Assistance: Maximum assistance Feeding assistance: Maximum assistance Dressing Assistance: Maximum assistance     Functional Limitations Info             SPECIAL CARE FACTORS FREQUENCY  PT (By licensed PT), OT (By licensed OT)     PT Frequency: 5 times per week OT Frequency: 5 times per week            Contractures      Additional Factors Info  Code Status, Allergies Code Status Info: full Allergies Info: nka           Current Medications (09/20/2022):  This is the current hospital active medication list Current Facility-Administered Medications  Medication Dose Route Frequency Provider Last Rate Last Admin   0.9 %  sodium chloride infusion   Intravenous Continuous Mansy, Jan A, MD 100 mL/hr at 09/19/22 1411 New Bag at 09/19/22 1411   0.9 %  sodium chloride infusion   Intravenous Continuous Reinaldo Berber, MD       acetaminophen (TYLENOL) tablet 325-650 mg  325-650 mg Oral Q6H PRN Reinaldo Berber, MD       acetaminophen (TYLENOL) tablet 500 mg  500 mg Oral Q6H Reinaldo Berber, MD   500 mg at 09/20/22 1149   albuterol (PROVENTIL) (2.5 MG/3ML)  0.083% nebulizer solution 2.5 mg  2.5 mg Nebulization Q6H PRN Otelia Sergeant, RPH       benztropine (COGENTIN) tablet 1 mg  1 mg Oral Daily Mansy, Jan A, MD   1 mg at 09/20/22 1027   docusate sodium (COLACE) capsule 100 mg  100 mg Oral BID Reinaldo Berber, MD   100 mg at 09/20/22 0918   enoxaparin (LOVENOX) injection 40 mg  40 mg Subcutaneous Q24H Reinaldo Berber, MD   40 mg at 09/20/22 2536   Fe Fum-Vit C-Vit B12-FA (TRIGELS-F FORTE) capsule 1 capsule  1 capsule Oral QPC breakfast Arnetha Courser, MD   1 capsule at 09/20/22 6440   feeding supplement (ENSURE ENLIVE / ENSURE  PLUS) liquid 237 mL  237 mL Oral BID BM Mansy, Jan A, MD       haloperidol (HALDOL) tablet 10 mg  10 mg Oral BID Mansy, Jan A, MD   10 mg at 09/20/22 3474   HYDROcodone-acetaminophen (NORCO) 7.5-325 MG per tablet 1-2 tablet  1-2 tablet Oral Q4H PRN Reinaldo Berber, MD       HYDROcodone-acetaminophen (NORCO/VICODIN) 5-325 MG per tablet 1-2 tablet  1-2 tablet Oral Q4H PRN Reinaldo Berber, MD       hydrOXYzine (ATARAX) tablet 50 mg  50 mg Oral BID PRN Mansy, Jan A, MD   50 mg at 09/19/22 1210   magnesium hydroxide (MILK OF MAGNESIA) suspension 30 mL  30 mL Oral Daily PRN Mansy, Jan A, MD       menthol-cetylpyridinium (CEPACOL) lozenge 3 mg  1 lozenge Oral PRN Reinaldo Berber, MD       Or   phenol (CHLORASEPTIC) mouth spray 1 spray  1 spray Mouth/Throat PRN Reinaldo Berber, MD       metoCLOPramide (REGLAN) tablet 5-10 mg  5-10 mg Oral Q8H PRN Reinaldo Berber, MD       Or   metoCLOPramide (REGLAN) injection 5-10 mg  5-10 mg Intravenous Q8H PRN Reinaldo Berber, MD       mometasone-formoterol [facility]) 100-5 MCG/ACT inhaler 2 puff  2 puff Inhalation BID Mansy, Jan A, MD   2 puff at 09/20/22 0919   montelukast (SINGULAIR) tablet 10 mg  10 mg Oral Daily Mansy, Jan A, MD   10 mg at 09/20/22 0920   morphine (PF) 2 MG/ML injection 0.5-1 mg  0.5-1 mg Intravenous Q2H PRN Reinaldo Berber, MD       multivitamin with minerals tablet 1 tablet  1 tablet Oral Daily Arnetha Courser, MD   1 tablet at 09/20/22 0920   naloxone [facility]) nasal spray 4 mg/0.1 mL  1 spray Nasal Once PRN Mansy, Jan A, MD       nicotine (NICODERM CQ - dosed in mg/24 hours) patch 21 mg  21 mg Transdermal Daily Mansy, Jan A, MD   21 mg at 09/20/22 0920   ondansetron (ZOFRAN) tablet 4 mg  4 mg Oral Q6H PRN Reinaldo Berber, MD       Or   ondansetron (ZOFRAN) injection 4 mg  4 mg Intravenous Q6H PRN Reinaldo Berber, MD       pantoprazole (PROTONIX) EC tablet 40 mg  40 mg Oral Daily Mansy, Jan A, MD   40 mg at 09/20/22 0918   polyethylene  glycol (MIRALAX / GLYCOLAX) packet 17 g  17 g Oral Daily Mansy, Jan A, MD   17 g at 09/20/22 0919   tiotropium [facility]) inhalation capsule ([facility] use ONLY) 18 mcg  18 mcg Inhalation Daily Drusilla Kanner,  RPH   18 mcg at 09/20/22 1158   traZODone (DESYREL) tablet 25 mg  25 mg Oral QHS PRN Mansy, Jan A, MD       traZODone (DESYREL) tablet 300 mg  300 mg Oral QHS Mansy, Vernetta Honey, MD         Discharge Medications: Please see discharge summary for a list of discharge medications.  Relevant Imaging Results:  Relevant Lab Results:   Additional Information SS #: [id number]  Eilis Chestnutt E Rachell Druckenmiller, LCSW

## 2022-09-20 NOTE — Progress Notes (Signed)
  Subjective: 1 Day Post-Op Procedure(s) (LRB): INTRAMEDULLARY (IM) NAIL INTERTROCHANTERIC (Right) Patient reports pain as mild.   Patient is well, and has had no acute complaints or problems.  Low urinary output last night.  Bolus given.  Hospitalist following. Plan is to go Rehab after hospital stay. Negative for chest pain and shortness of breath Fever: no Gastrointestinal: Negative for nausea and vomiting  Objective: Vital signs in last 24 hours: Temp:  [97.4 F (36.3 C)-98.9 F (37.2 C)] 98.3 F (36.8 C) (08/24 0440) Pulse Rate:  [62-110] 75 (08/24 0440) Resp:  [13-21] 20 (08/24 0440) BP: (89-149)/(63-106) 137/89 (08/24 0440) SpO2:  [95 %-100 %] 100 % (08/24 0440)  Intake/Output from previous day:  Intake/Output Summary (Last 24 hours) at 09/20/2022 0648 Last data filed at 09/20/2022 0612 Gross per 24 hour  Intake 2693.33 ml  Output --  Net 2693.33 ml    Intake/Output this shift: Total I/O In: 1093.3 [I.V.:893.3; IV Piggyback:200] Out: -   Labs: Recent Labs    09/19/22 0107 09/19/22 0440 09/20/22 0527  HGB 10.8* 10.7* 10.0*   Recent Labs    09/19/22 0440 09/20/22 0527  WBC 11.3* 9.4  RBC 4.60 4.33  HCT 32.9* 32.4*  PLT 360 301   Recent Labs    09/19/22 0440 09/20/22 0527  NA 138 136  K 3.2* 4.1  CL 104 109  CO2 23 21*  BUN 10 10  CREATININE 0.75 0.80  GLUCOSE 138* 109*  CALCIUM 8.3* 8.0*   No results for input(s): "LABPT", "INR" in the last 72 hours.   EXAM General - Patient is Confused and lethargic Extremity - Sensation intact distally Dorsiflexion/Plantar flexion intact Compartment soft Dressing/Incision - clean, dry, no drainage Motor Function - intact, moving foot and toes well on exam.   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    COPD (chronic obstructive pulmonary disease) (HCC)    Depression    GERD (gastroesophageal reflux disease)    Hepatitis B  carrier (HCC)    HLD (hyperlipidemia)    HTN (hypertension)    Schizophrenia (HCC)     Assessment/Plan: 1 Day Post-Op Procedure(s) (LRB): INTRAMEDULLARY (IM) NAIL INTERTROCHANTERIC (Right) Principal Problem:   Closed right hip fracture (HCC) Active Problems:   Schizophrenia (HCC)   Chronic obstructive pulmonary disease (HCC)   Malnutrition of moderate degree   Eosinophilic asthma   AF (paroxysmal atrial fibrillation) (HCC)   Depression   GERD without esophagitis   Fall   Closed displaced intertrochanteric fracture of right femur (HCC)   Right hip pain  Estimated body mass index is 24.51 kg/m as calculated from the following:   Height as of this encounter: 5\' 9"  (1.753 m).   Weight as of 08/22/22: 75.3 kg. Advance diet Up with therapy  Discharge planning: Follow-up at [facility] clinic orthopedics in 2 weeks for staple removal.  No showering.  Dressing changes needed.  DVT Prophylaxis - Lovenox, Foot Pumps, and TED hose Weight-Bearing as tolerated to right leg  Dedra Skeens, PA-C Orthopaedic Surgery 09/20/2022, 6:48 AM

## 2022-09-20 NOTE — Evaluation (Signed)
Occupational Therapy Evaluation Patient Details Name: Adrian Gillespie MRN: [id number] DOB: 1950/07/25 Today's Date: 09/20/2022   History of Present Illness Adrian Gillespie is a 72 y.o. Caucasian male with medical history significant for COPD, GERD, type pretension, dyslipidemia, schizophrenia, anxiety and depression, presented to the emergency room with acute onset of witnessed accidental mechanical fall with subsequent right hip pain.  Patient is from an assisted living with memory care unit.   Clinical Impression   Pt seen for OT evaluation this date, pt in bed on arrival and reports increased pain, unable to rate pain numerically but indicates 9/10 with faces rating.  Pt is a poor historian, he reports he lives with his brother in an apartment however his chart indicates he lives in a memory care unit in an assisted living.  Pt is s/p Right hip Intertrochanteric fracture with  Right Hip Intramedullary nail, WBAT on right.  Pt presents with pain, decreased transfers, decreased mobility, cognitive impairments, and decreased ability to perform self care tasks and would benefit from skilled OT services to maximize safety and independence in necessary daily tasks.        If plan is discharge home, recommend the following: A lot of help with walking and/or transfers;A lot of help with bathing/dressing/bathroom;Assistance with cooking/housework;Supervision due to cognitive status;Direct supervision/assist for medications management;Assist for transportation    Functional Status Assessment  Patient has had a recent decline in their functional status and demonstrates the ability to make significant improvements in function in a reasonable and predictable amount of time.  Equipment Recommendations       Recommendations for Other Services       Precautions / Restrictions Precautions Precautions: Fall Restrictions Weight Bearing Restrictions: Yes RLE Weight Bearing: Weight bearing as tolerated       Mobility Bed Mobility Overal bed mobility: Needs Assistance Bed Mobility: Supine to Sit     Supine to sit: Max assist     General bed mobility comments: Increased pain with movement, max assist with bed mobility    Transfers Overall transfer level: Needs assistance                 General transfer comment: Attempted supine to sit edge of bed however, patient with increased pain, required max assist and could not sit edge of bed, returned back to supine position.      Balance                                           ADL either performed or assessed with clinical judgement   ADL Overall ADL's : Needs assistance/impaired Eating/Feeding: Modified independent   Grooming: Set up;Bed level Grooming Details (indicate cue type and reason): Unable to sit edge of bed due to pain Upper Body Bathing: Minimal assistance   Lower Body Bathing: Maximal assistance   Upper Body Dressing : Set up   Lower Body Dressing: Maximal assistance   Toilet Transfer: +2 for physical assistance Toilet Transfer Details (indicate cue type and reason): anticipate +2 for transfer to toilet based on performance during evaluation and level of pain.                 Vision         Perception         Praxis         Pertinent Vitals/Pain Pain Assessment Pain Assessment: 0-10 Pain Score: 9  Pain Location: right hip Pain Descriptors / Indicators: Aching, Sore, Tender Pain Intervention(s): Monitored during session, Repositioned     Extremity/Trunk Assessment Upper Extremity Assessment Upper Extremity Assessment: Generalized weakness;Right hand dominant   Lower Extremity Assessment Lower Extremity Assessment: Defer to PT evaluation       Communication Communication Communication: No apparent difficulties   Cognition Arousal: Alert Behavior During Therapy: WFL for tasks assessed/performed Overall Cognitive Status: History of cognitive impairments - at  baseline                                 General Comments: Chart indicates pt lives in a memory care unit and has a history of cognitive impairments.     General Comments  To be assessed when patient is able to tolerate sitting edge of bed and standing.    Exercises     Shoulder Instructions      Home Living Family/patient expects to be discharged to:: Skilled nursing facility                                 Additional Comments: chart indicates patient is in a memory care unit in assisted living prior to admission      Prior Functioning/Environment Prior Level of Function : Patient poor historian/Family not available             Mobility Comments: Pt is a poor historian but states he did not use adaptive devices prior to admission. ADLs Comments: unable to determine his prior level of function at this time.        OT Problem List: Decreased strength;Decreased activity tolerance;Decreased cognition;Decreased knowledge of use of DME or AE;Decreased range of motion;Impaired balance (sitting and/or standing);Decreased safety awareness;Pain      OT Treatment/Interventions: Self-care/ADL training;DME and/or AE instruction;Therapeutic activities;Balance training;Therapeutic exercise;Cognitive remediation/compensation;Patient/family education    OT Goals(Current goals can be found in the care plan section) Acute Rehab OT Goals Patient Stated Goal: Pt states he wants his hip to stop hurting and to go home. OT Goal Formulation: With patient Time For Goal Achievement: 10/04/22 Potential to Achieve Goals: Fair  OT Frequency: Min 2X/week    Co-evaluation              AM-PAC OT "6 Clicks" Daily Activity     Outcome Measure Help from another person eating meals?: None Help from another person taking care of personal grooming?: A Little Help from another person toileting, which includes using toliet, bedpan, or urinal?: Total Help from another  person bathing (including washing, rinsing, drying)?: A Lot Help from another person to put on and taking off regular upper body clothing?: None Help from another person to put on and taking off regular lower body clothing?: Total 6 Click Score: 15   End of Session    Activity Tolerance: Patient limited by pain Patient left: in bed;with bed alarm set  OT Visit Diagnosis: Unsteadiness on feet (R26.81);Muscle weakness (generalized) (M62.81);Pain Pain - Right/Left: Right Pain - part of body: Hip                Time: 4132-4401 OT Time Calculation (min): 16 min Charges:  OT General Charges $OT Visit: 1 Visit OT Evaluation $OT Eval Low Complexity: 1 Low  Burlie Cajamarca T Leigh Blas, OTR/L, CLT Lily Velasquez 09/20/2022, 12:43 PM

## 2022-09-20 NOTE — Discharge Instructions (Signed)

## 2022-09-20 NOTE — Evaluation (Signed)
Physical Therapy Evaluation Patient Details Name: Adrian Gillespie MRN: [id number] DOB: [birth year] Today's Date: 09/20/2022  History of Present Illness  Adrian Gillespie is a 72 y.o. Caucasian male with medical history significant for COPD, GERD, type pretension, dyslipidemia, schizophrenia, anxiety and depression, presented to the emergency room with acute onset of witnessed accidental mechanical fall with subsequent right hip pain.  Patient is from an assisted living with memory care unit.  Clinical Impression  The pt is presenting this session with improved pain control from earlier OT session. He demonstrates tolerance to bed mobility training and transfer training. The pt was greatly motivated by his brother's presence during his session today. He presents with strength and mobility deficits of the RLE at this time. Recommending continued skilled PT to address functional deficits.       If plan is discharge home, recommend the following: A little help with walking and/or transfers;A little help with bathing/dressing/bathroom;Direct supervision/assist for medications management;Direct supervision/assist for financial management;Help with stairs or ramp for entrance   Can travel by private vehicle   No    Equipment Recommendations    Recommendations for Other Services       Functional Status Assessment Patient has had a recent decline in their functional status and demonstrates the ability to make significant improvements in function in a reasonable and predictable amount of time.     Precautions / Restrictions Precautions Precautions: Fall Restrictions Weight Bearing Restrictions: Yes RLE Weight Bearing: Weight bearing as tolerated      Mobility  Bed Mobility Overal bed mobility: Needs Assistance Bed Mobility: Supine to Sit     Supine to sit: Min assist, HOB elevated, Used rails     General bed mobility comments: Assistance with RLE mobility.    Transfers Overall transfer  level: Needs assistance Equipment used: Rolling walker (2 wheels) Transfers: Sit to/from Stand Sit to Stand: Min assist (verbal cues for foward translation and use of AD.)                Ambulation/Gait                  Stairs            Wheelchair Mobility     Tilt Bed    Modified Rankin (Stroke Patients Only)       Balance Overall balance assessment: Needs assistance   Sitting balance-Leahy Scale: Good     Standing balance support: Reliant on assistive device for balance Standing balance-Leahy Scale: Fair                               Pertinent Vitals/Pain Pain Assessment Pain Assessment: No/denies pain Pain Intervention(s): Monitored during session    Home Living Family/patient expects to be discharged to:: Assisted living                   Additional Comments: Brother indicates patient is from a memory care unit ALF    Prior Function Prior Level of Function : History of Falls (last six months)             Mobility Comments: Pt stating that he did not use AD prior to admission, family in room and does not state that this information is false. ADLs Comments: unable to determine his prior level of function at this time.     Extremity/Trunk Assessment   Upper Extremity Assessment Upper Extremity Assessment: Overall WFL for tasks assessed  Lower Extremity Assessment Lower Extremity Assessment: RLE deficits/detail RLE Deficits / Details: decreased hip strength (flexion, extension, abduction, adduction) RLE Sensation: WNL       Communication   Communication Communication: No apparent difficulties Cueing Techniques: Verbal cues;Tactile cues;Visual cues  Cognition Arousal: Alert Behavior During Therapy: WFL for tasks assessed/performed Overall Cognitive Status: History of cognitive impairments - at baseline                                          General Comments General comments (skin  integrity, edema, etc.): To be assessed when patient is able to tolerate sitting edge of bed and standing.    Exercises     Assessment/Plan    PT Assessment Patient needs continued PT services  PT Problem List Decreased strength;Decreased balance;Decreased mobility       PT Treatment Interventions Gait training;Therapeutic activities;Therapeutic exercise;Functional mobility training;Balance training;Neuromuscular re-education;Patient/family education    PT Goals (Current goals can be found in the Care Plan section)  Acute Rehab PT Goals Patient Stated Goal: Pt not stating a goal; however is in agreement with brother's goal to get better everyday. PT Goal Formulation: With patient/family Time For Goal Achievement: 10/04/22 Potential to Achieve Goals: Good    Frequency BID     Co-evaluation               AM-PAC PT "6 Clicks" Mobility  Outcome Measure Help needed turning from your back to your side while in a flat bed without using bedrails?: A Little Help needed moving from lying on your back to sitting on the side of a flat bed without using bedrails?: A Little Help needed moving to and from a bed to a chair (including a wheelchair)?: A Lot Help needed standing up from a chair using your arms (e.g., wheelchair or bedside chair)?: A Little Help needed to walk in hospital room?: A Lot Help needed climbing 3-5 steps with a railing? : A Lot 6 Click Score: 15    End of Session Equipment Utilized During Treatment: Gait belt Activity Tolerance: Patient tolerated treatment well Patient left: in bed;with bed alarm set;with family/visitor present;with call bell/phone within reach Nurse Communication: Mobility status PT Visit Diagnosis: Unsteadiness on feet (R26.81);Muscle weakness (generalized) (M62.81);Difficulty in walking, not elsewhere classified (R26.2)    Time: 8413-2440 PT Time Calculation (min) (ACUTE ONLY): 21 min   Charges:   PT Evaluation $PT Eval Low  Complexity: 1 Low PT Treatments $Therapeutic Activity: 8-22 mins PT General Charges $$ ACUTE PT VISIT: 1 Visit         3:58 PM, 09/20/22 Adrian Gillespie A. Mordecai Maes PT, DPT Physical Therapist - [facility] Brianni Manthe 09/20/2022, 3:56 PM

## 2022-09-20 NOTE — Plan of Care (Signed)
  Problem: Education: Goal: Knowledge of General Education information will improve Description: Including pain rating scale, medication(s)/side effects and non-pharmacologic comfort measures Outcome: Progressing   Problem: Clinical Measurements: Goal: Ability to maintain clinical measurements within normal limits will improve Outcome: Progressing   Problem: Pain Managment: Goal: General experience of comfort will improve Outcome: Progressing   Problem: Skin Integrity: Goal: Risk for impaired skin integrity will decrease Outcome: Progressing   

## 2022-09-20 NOTE — TOC Initial Note (Signed)
Transition of Care [facility]) - Initial/Assessment Note    Patient Details  Name: Adrian Gillespie MRN: [id number] Date of Birth: 1951/01/18  [facility]) CM/SW Contact:    Adrian Cline, LCSW Phone Number: 09/20/2022, 3:21 PM  Clinical Narrative:                 Informed by MD that PT is recommending SNF.  Spoke with sister Adrian Gillespie via phone. Adrian Gillespie states she is patient's POA. Patient resides at [facility] [address]  Adrian Gillespie is agreeable to SNF. Per chart review, patient went to [facility] in the past.  SNF work up started.   Expected Discharge Plan: Skilled Nursing Facility Barriers to Discharge: Continued Medical Work up   Patient Goals and CMS Choice Patient states their goals for this hospitalization and ongoing recovery are:: SNF CMS Medicare.gov Compare Post Acute Care list provided to:: Patient Represenative (must comment) Choice offered to / list presented to : [facility] POA / Guardian      Expected Discharge Plan and Services       Living arrangements for the past 2 months: Assisted Living Facility                                      Prior Living Arrangements/Services Living arrangements for the past 2 months: Assisted Living Facility Lives with:: Facility Resident Patient language and need for interpreter reviewed:: Yes Do you feel safe going back to the place where you live?: Yes      Need for Family Participation in Patient Care: Yes (Comment) Care giver support system in place?: Yes (comment)   Criminal Activity/Legal Involvement Pertinent to Current Situation/Hospitalization: No - Comment as needed  Activities of Daily Living Home Assistive Devices/Equipment: Environmental consultant (specify type), Wheelchair (SNF patient) ADL Screening (condition at time of admission) Patient's cognitive ability adequate to safely complete daily activities?: Yes Is the patient deaf or have difficulty hearing?: No Does the patient have difficulty seeing, even when wearing  glasses/contacts?: No Does the patient have difficulty concentrating, remembering, or making decisions?: No Patient able to express need for assistance with ADLs?: Yes Does the patient have difficulty dressing or bathing?: Yes Independently performs ADLs?: No Communication: Independent Dressing (OT): Needs assistance Is this a change from baseline?: Change from baseline, expected to last >3 days Grooming: Independent Feeding: Independent Bathing: Needs assistance Is this a change from baseline?: Change from baseline, expected to last >3 days Toileting: Needs assistance Is this a change from baseline?: Change from baseline, expected to last >3days In/Out Bed: Needs assistance Is this a change from baseline?: Pre-admission baseline Walks in Home: Needs assistance Is this a change from baseline?: Pre-admission baseline Does the patient have difficulty walking or climbing stairs?: Yes Weakness of Legs: Right Weakness of Arms/Hands: None  Permission Sought/Granted Permission sought to share information with : Oceanographer granted to share information with : Yes, Designer, fashion/clothing (by POA/sister)     Permission granted to share info w AGENCY: SNFs, [facility]        Emotional Assessment       Orientation: : Fluctuating Orientation (Suspected and/or reported Sundowners) Alcohol / Substance Use: Not Applicable Psych Involvement: No (comment)  Admission diagnosis:  Right hip pain [M25.551] Closed right hip fracture (HCC) [S72.001A] Fall, initial encounter [W19.XXXA] Closed displaced intertrochanteric fracture of right femur, initial encounter [facility]) [S72.141A] Patient Active Problem List  Diagnosis Date Noted   Closed right hip fracture (HCC) 09/19/2022   Depression 09/19/2022   GERD without esophagitis 09/19/2022   Fall 09/19/2022   Closed displaced intertrochanteric fracture of right femur (HCC) 09/19/2022   Right hip pain  09/19/2022   Lethargy    Acquired thrombophilia (HCC)    Intractable pain 02/02/2020   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   Femur fracture, left (HCC) 02/02/2020   AF (paroxysmal atrial fibrillation) (HCC) 02/02/2020   Malnutrition of moderate degree 05/26/2019   Acute respiratory failure with hypoxemia (HCC) 05/26/2019   CAP (community acquired pneumonia) 04/20/2019   PVC (premature ventricular contraction) 04/20/2019   GERD (gastroesophageal reflux disease) 04/20/2019   Acute on chronic respiratory failure with hypoxia (HCC)    HTN (hypertension)    Chronic obstructive pulmonary disease (HCC)    NSVT (nonsustained ventricular tachycardia) (HCC)    Acute respiratory failure with hypoxia (HCC) 04/02/2019   Hypokalemia 04/02/2019   Schizophrenia (HCC) 04/02/2019   PCP:  Andres Shad, MD Pharmacy:   [facility] - [place], [place] - [address] [place] [address] Phone: [phone] Fax: [phone]     Social Determinants of Health (SDOH) Social History: SDOH Screenings   Food Insecurity: No Food Insecurity (09/19/2022)  Housing: Low Risk  (09/19/2022)  Transportation Needs: No Transportation Needs (09/19/2022)  Utilities: Not At Risk (09/19/2022)  Financial Resource Strain: Medium Risk (06/26/2022)   Received from [facility], [facility]  Physical Activity: Sufficiently Active (06/26/2022)   [facility] from [facility], [facility]  Social Connections: Moderately Isolated (07/18/2020)   Received from [facility], [facility]  Stress: No Stress Concern Present (06/26/2022)   Received from [facility], [facility]  Tobacco Use: High Risk (09/19/2022)  Health Literacy: High Risk (06/26/2022)   Received from [facility], [facility]   SDOH Interventions:     Readmission Risk Interventions    09/20/2022    3:20 PM  Readmission Risk Prevention Plan  Transportation Screening Complete  PCP  or Specialist Appt within 3-5 Days Complete  HRI or Home Care Consult Complete  Social Work Consult for Recovery Care Planning/Counseling Complete  Palliative Care Screening Not Applicable  Medication Review Oceanographer) Complete

## 2022-09-20 NOTE — Progress Notes (Signed)
Progress Note   Patient: Adrian Gillespie ZOX:[id number] DOB: 1950/04/15 DOA: 09/19/2022     1 DOS: the patient was seen and examined on 09/20/2022   Brief hospital course: Taken from H&P.  Adrian Gillespie is a 72 y.o. Caucasian male with medical history significant for COPD, GERD, type pretension, dyslipidemia, schizophrenia, anxiety and depression, presented to the emergency room with acute onset of witnessed accidental mechanical fall with subsequent right hip pain.   On arrival hemodynamically stable, hip x-ray with a right mildly displaced intertrochanteric fracture.  Patient was on Eliquis which was held. Orthopedic was consulted.  8/23: Vital stable, hemoglobin stable as compared to admission at 10.7, decreased from 4-week check of 12.3.  Iron studies done last month by PCP with concern of anemia of chronic disease with some iron deficiency, patient was asked to have colonoscopy as there is an history of prior polyp, not done yet.   Orthopedic is taking him to the OR later today for ORIF. Patient was not on any anticoagulation at this time-verified by pharmacy from his facility Patient is from an assisted living with memory care unit.  8/24: Labs and vital stable.  Unable to void after the surgery requiring In-N-Out catheter with drainage of more than 600 mL of urine.  Patient is incontinent at baseline.  Lives in a memory care unit.  Need to go to rehab.   Assessment and Plan: * Closed right hip fracture (HCC) - This is secondary to a mechanical fall. S/p ORIF.  Patient was not on any anticoagulation per facility.  Verified by our pharmacy. -PT is recommending rehab. -Continue with supportive care and pain management -Patient will be weightbearing as tolerated per orthopedic surgery  Chronic obstructive pulmonary disease (HCC) Stable.  No concern of exacerbation at this time - We will continue his inhalers and place him on as needed albuterol.  AF (paroxysmal atrial fibrillation)  (HCC) Currently in normal sinus rhythm, not on any rate control medication or other A-fib related medications at home.  Not sure whether he was taking Eliquis but it was listed in his home medications-pharmacy to verify -Holding Eliquis for surgery -We can resume once cleared from orthopedic  Schizophrenia (HCC) - We will continue his Haldol and Cogentin.  Depression - We will continue trazodone.  GERD without esophagitis - We will continue PPI therapy.  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      Subjective: Pain seems well-controlled.  Unable to void since surgery requiring In-N-Out catheter, no other concern  Physical Exam: Vitals:   09/19/22 2012 09/20/22 0009 09/20/22 0440 09/20/22 0837  BP: 90/72 106/73 137/89 107/82  Pulse: 67 62 75 92  Resp: 18 18 20 16   Temp: 98.9 F (37.2 C) 97.8 F (36.6 C) 98.3 F (36.8 C) 97.6 F (36.4 C)  TempSrc:  Oral    SpO2: 100% 100% 100% 98%  Height:       General.  Frail elderly man, in no acute distress. Pulmonary.  Lungs clear bilaterally, normal respiratory effort. CV.  Regular rate and rhythm, no JVD, rub or murmur. Abdomen.  Soft, nontender, nondistended, BS positive. CNS.  Alert and oriented x 2.  No focal neurologic deficit. Extremities.  No edema, no cyanosis, pulses intact and symmetrical. Psychiatry.  Appears to have cognitive impairment  Data Reviewed: Prior data reviewed  Family Communication: Discussed with sister on phone  Disposition: Status is: Inpatient Remains inpatient appropriate because: Severity of illness  Planned  Discharge Destination: Skilled nursing facility  Time spent:45  minutes  This record has been created using Conservation officer, historic buildings. Errors have been sought and corrected,but may not always be located. Such creation errors do not reflect on the standard of care.   Author: Arnetha Courser, MD 09/20/2022 1:44 PM  For on call  review [email or web address].

## 2022-09-21 DIAGNOSIS — F209 Schizophrenia, unspecified: Secondary | ICD-10-CM | POA: Diagnosis not present

## 2022-09-21 DIAGNOSIS — S72001A Fracture of unspecified part of neck of right femur, initial encounter for closed fracture: Secondary | ICD-10-CM | POA: Diagnosis not present

## 2022-09-21 DIAGNOSIS — I480 Paroxysmal atrial fibrillation: Secondary | ICD-10-CM | POA: Diagnosis not present

## 2022-09-21 DIAGNOSIS — J449 Chronic obstructive pulmonary disease, unspecified: Secondary | ICD-10-CM | POA: Diagnosis not present

## 2022-09-21 LAB — CBC
HCT: 29.1 % — ABNORMAL LOW (ref 39.0–52.0)
Hemoglobin: 9.4 g/dL — ABNORMAL LOW (ref 13.0–17.0)
MCH: 23 pg — ABNORMAL LOW (ref 26.0–34.0)
MCHC: 32.3 g/dL (ref 30.0–36.0)
MCV: 71.3 fL — ABNORMAL LOW (ref 80.0–100.0)
Platelets: 300 10*3/uL (ref 150–400)
RBC: 4.08 MIL/uL — ABNORMAL LOW (ref 4.22–5.81)
RDW: 18.1 % — ABNORMAL HIGH (ref 11.5–15.5)
WBC: 8.2 10*3/uL (ref 4.0–10.5)
nRBC: 0 % (ref 0.0–0.2)

## 2022-09-21 LAB — BASIC METABOLIC PANEL
Anion gap: 7 (ref 5–15)
BUN: 9 mg/dL (ref 8–23)
CO2: 20 mmol/L — ABNORMAL LOW (ref 22–32)
Calcium: 7.7 mg/dL — ABNORMAL LOW (ref 8.9–10.3)
Chloride: 108 mmol/L (ref 98–111)
Creatinine, Ser: 0.81 mg/dL (ref 0.61–1.24)
GFR, Estimated: >60 mL/min (ref >60)
Glucose, Bld: 157 mg/dL — ABNORMAL HIGH (ref 70–99)
Potassium: 3.7 mmol/L (ref 3.5–5.1)
Sodium: 135 mmol/L (ref 135–145)

## 2022-09-21 MED ORDER — FE FUM-VIT C-VIT B12-FA 460-60-0.01-1 MG PO CAPS
1.0000 | ORAL_CAPSULE | Freq: Two times a day (BID) | ORAL | Status: DC
  Administered 2022-09-21 – 2022-09-23 (×4): 1 via ORAL
  Filled 2022-09-21 (×4): qty 1

## 2022-09-21 MED ORDER — TAMSULOSIN HCL 0.4 MG PO CAPS
0.4000 mg | ORAL_CAPSULE | Freq: Every day | ORAL | Status: DC
  Administered 2022-09-21 – 2022-09-22 (×2): 0.4 mg via ORAL
  Filled 2022-09-21 (×2): qty 1

## 2022-09-21 NOTE — Progress Notes (Signed)
NT notified this nurse that pt's foley catheter was out. This nurse assessed the meatus and the foley tube. Foley tip balloon site appeared with no inflation and meatus site without redness, trauma, nor bleeding.  Notified attending MD. Per MD, see if pt can urine on his own before replacing with a new foley.

## 2022-09-21 NOTE — Progress Notes (Signed)
This nurse arrived to bedside when pt pressed the bed alarm. Pt with urgency and unable to hold urine when this nurse assisted pt to use the bedside urinal. Bed was soaked with urine. Notified attending MD. Attending MD, okay to apply male purwick on this pt.

## 2022-09-21 NOTE — Progress Notes (Addendum)
Physical Therapy Treatment Patient Details Name: Adrian Gillespie MRN: [id number] DOB: July 08, 1950 Today's Date: 09/21/2022   History of Present Illness Adrian Gillespie is a 72 yo white male that presents POD #2 for right intramedullary fixation of right hip for mechanical fall that resulted in right hip fracture. PMH includes COPD, GERT, dyslipidemia, schizophrenia, anxiety, and depression.    PT Comments  Pt presents supine in bed, A&Ox2, and agreeable to PT. Pt progressing towards goals with ability to perform sit to stand with mod A and mod VC and TC for correct placement of feet and to position himself closer to edge of bed. Pt limited by pain during session as progressing RLE from supine to edge of bed resulted in an increase in his pain level. Due to cognitive impairments, pt required mod VC for redirection and sequence of task. He will continue to benefit from skilled PT to improve mobility and RLE strength to return to PLOF.    If plan is discharge home, recommend the following: A lot of help with walking and/or transfers;A lot of help with bathing/dressing/bathroom;Supervision due to cognitive status;Assistance with cooking/housework;Assistance with feeding;Assist for transportation   Can travel by private vehicle     No  Equipment Recommendations  Rolling walker (2 wheels)    Recommendations for Other Services       Precautions / Restrictions Precautions Precautions: None Restrictions Weight Bearing Restrictions: Yes RLE Weight Bearing: Weight bearing as tolerated     Mobility  Bed Mobility Overal bed mobility: Needs Assistance       Supine to sit: Max assist     General bed mobility comments: Assistance with guiding legs to EOB    Transfers Overall transfer level: Needs assistance Equipment used: Rolling walker (2 wheels) Transfers: Sit to/from Stand Sit to Stand: Mod assist                Ambulation/Gait                   Stairs              Wheelchair Mobility     Tilt Bed    Modified Rankin (Stroke Patients Only)       Balance Overall balance assessment: Needs assistance Sitting-balance support: No upper extremity supported, Feet supported Sitting balance-Leahy Scale: Good   Postural control: Left lateral lean Standing balance support: Bilateral upper extremity supported, Reliant on assistive device for balance Standing balance-Leahy Scale: Poor                              Cognition Arousal: Alert Behavior During Therapy: WFL for tasks assessed/performed Overall Cognitive Status: History of cognitive impairments - at baseline                                 General Comments: A&O x 2; unable to recall date and difficulty with recalling where he is        Exercises Other Exercises Other Exercises: Glute Squeezes with 3 sec hold 1 x 10    General Comments        Pertinent Vitals/Pain Pain Assessment Pain Assessment: 0-10 Pain Score: 3  Pain Location: Right hip Pain Descriptors / Indicators: Aching Pain Intervention(s): Monitored during session    Home Living  Prior Function            PT Goals (current goals can now be found in the care plan section) Acute Rehab PT Goals Patient Stated Goal: To return to ALF PT Goal Formulation: With patient Time For Goal Achievement: 10/05/22 Potential to Achieve Goals: Good Progress towards PT goals: Progressing toward goals    Frequency    BID      PT Plan      Co-evaluation              AM-PAC PT "6 Clicks" Mobility   Outcome Measure  Help needed turning from your back to your side while in a flat bed without using bedrails?: Total Help needed moving from lying on your back to sitting on the side of a flat bed without using bedrails?: Total Help needed moving to and from a bed to a chair (including a wheelchair)?: Total Help needed standing up from a chair using your  arms (e.g., wheelchair or bedside chair)?: A Lot Help needed to walk in hospital room?: Total Help needed climbing 3-5 steps with a railing? : Total 6 Click Score: 7    End of Session Equipment Utilized During Treatment: Gait belt Activity Tolerance: Patient limited by pain Patient left: in bed Nurse Communication: Other (comment) (Cather full) PT Visit Diagnosis: Unsteadiness on feet (R26.81);Difficulty in walking, not elsewhere classified (R26.2);History of falling (Z91.81)     Time: 1610-9604 PT Time Calculation (min) (ACUTE ONLY): 45 min  Charges:    $Therapeutic Activity: 23-37 mins PT General Charges $$ ACUTE PT VISIT: 1 Visit                     Ellin Goodie PT, DPT

## 2022-09-21 NOTE — Progress Notes (Signed)
Physical Therapy Treatment Patient Details Name: Adrian Gillespie MRN: [id number] DOB: 1950/05/29 Today's Date: 09/21/2022   History of Present Illness Adrian Gillespie is a 72 yo white male that presents for 2nd treatment session of day POD #2 for right intramedullary fixation of right hip for mechanical fall that resulted in right hip fracture. PMH includes COPD, GERD, dyslipedmia, schizophrenia, anxiety, and depression.    PT Comments  Pt presents for treatment seated in chair and agreeable to PT. Pt shows progress with mobility and RLE strength with ability to perform sit to stand and stand pivot transfer. He continues to be limited by pain and fatigue requiring max A for bed mobility and mod A for sit to stand and stand pivot transfers. He will continue to benefit from skilled PT to improve mobility and to increase RLE strength to return to PLOF.     If plan is discharge home, recommend the following: A lot of help with walking and/or transfers;A lot of help with bathing/dressing/bathroom   Can travel by private vehicle     No  Equipment Recommendations  Rolling walker (2 wheels)    Recommendations for Other Services       Precautions / Restrictions Precautions Precautions: None Restrictions Weight Bearing Restrictions: No RLE Weight Bearing: Weight bearing as tolerated     Mobility  Bed Mobility Overal bed mobility: Needs Assistance Bed Mobility: Sit to Supine     Supine to sit: Max assist Sit to supine: Total assist   General bed mobility comments: Assisted with lifting and guiding BLE from floor into bed    Transfers Overall transfer level: Needs assistance Equipment used: Rolling walker (2 wheels) Transfers: Sit to/from Stand, Bed to chair/wheelchair/BSC Sit to Stand: Mod assist Stand pivot transfers: Mod assist         General transfer comment: Requiring closer to max towards end of stand pivot due to fatigue    Ambulation/Gait                    Stairs             Wheelchair Mobility     Tilt Bed    Modified Rankin (Stroke Patients Only)       Balance Overall balance assessment: Needs assistance Sitting-balance support: Bilateral upper extremity supported Sitting balance-Leahy Scale: Good   Postural control: Left lateral lean Standing balance support: Bilateral upper extremity supported Standing balance-Leahy Scale: Fair Standing balance comment: Needs BUE support                            Cognition Arousal: Alert Behavior During Therapy: Impulsive Overall Cognitive Status: History of cognitive impairments - at baseline                                 General Comments: Requires mod VC to redirect and sequence tasks.        Exercises Other Exercises Other Exercises: Long Arc Quads on RLE with intermmitent assistance of limb from PT 1 x 10    General Comments        Pertinent Vitals/Pain Pain Assessment Pain Assessment: 0-10 Pain Score: 5  Pain Location: Right hip Pain Descriptors / Indicators: Aching Pain Intervention(s): Premedicated before session, Monitored during session    Home Living  Prior Function            PT Goals (current goals can now be found in the care plan section) Acute Rehab PT Goals Patient Stated Goal: To leave hospital and return to assisted living facility. PT Goal Formulation: With patient Time For Goal Achievement: 10/05/22 Progress towards PT goals: Progressing toward goals    Frequency    BID      PT Plan      Co-evaluation              AM-PAC PT "6 Clicks" Mobility   Outcome Measure  Help needed turning from your back to your side while in a flat bed without using bedrails?: A Lot Help needed moving from lying on your back to sitting on the side of a flat bed without using bedrails?: Total Help needed moving to and from a bed to a chair (including a wheelchair)?: A Lot Help  needed standing up from a chair using your arms (e.g., wheelchair or bedside chair)?: A Lot Help needed to walk in hospital room?: Total Help needed climbing 3-5 steps with a railing? : Total 6 Click Score: 9    End of Session Equipment Utilized During Treatment: Gait belt Activity Tolerance: Patient limited by pain;Patient limited by fatigue Patient left: in bed;with bed alarm set;with SCD's reapplied;with call bell/phone within reach Nurse Communication: Other (comment) (Catheter full of urine and needs to be changed) PT Visit Diagnosis: Repeated falls (R29.6);Pain;Other abnormalities of gait and mobility (R26.89) Pain - Right/Left: Right Pain - part of body: Hip     Time: 1335-1400 PT Time Calculation (min) (ACUTE ONLY): 25 min  Charges:    $Therapeutic Activity: 23-37 mins PT General Charges $$ ACUTE PT VISIT: 1 Visit                    Ellin Goodie PT, DPT

## 2022-09-21 NOTE — Plan of Care (Signed)
  Problem: Elimination: Goal: Will not experience complications related to bowel motility Outcome: Progressing   Problem: Nutrition: Goal: Adequate nutrition will be maintained Outcome: Progressing   Problem: Activity: Goal: Risk for activity intolerance will decrease Outcome: Progressing   Problem: Pain Managment: Goal: General experience of comfort will improve Outcome: Progressing   Problem: Safety: Goal: Ability to remain free from injury will improve Outcome: Progressing

## 2022-09-21 NOTE — Progress Notes (Signed)
  Subjective: 2 Days Post-Op Procedure(s) (LRB): INTRAMEDULLARY (IM) NAIL INTERTROCHANTERIC (Right) Patient reports pain as mild.  Patient more alert this morning. Patient is well, and has had no acute complaints or problems.  Low urinary output last night.  Bolus given.  Hospitalist following. Plan is to go Rehab after hospital stay. Negative for chest pain and shortness of breath Fever: no Gastrointestinal: Negative for nausea and vomiting  Objective: Vital signs in last 24 hours: Temp:  [97.6 F (36.4 C)-99.3 F (37.4 C)] 99.3 F (37.4 C) (08/25 0454) Pulse Rate:  [87-102] 94 (08/25 0454) Resp:  [16-20] 16 (08/25 0454) BP: (100-119)/(74-82) 119/74 (08/25 0454) SpO2:  [91 %-98 %] 96 % (08/25 0454)  Intake/Output from previous day:  Intake/Output Summary (Last 24 hours) at 09/21/2022 0653 Last data filed at 09/21/2022 5621 Gross per 24 hour  Intake 860 ml  Output 2950 ml  Net -2090 ml    Intake/Output this shift: Total I/O In: 500 [P.O.:500] Out: 2350 [Urine:2350]  Labs: Recent Labs    09/19/22 0107 09/19/22 0440 09/20/22 0527 09/20/22 1051 09/21/22 0524  HGB 10.8* 10.7* 10.0* 10.5* 9.4*   Recent Labs    09/20/22 0527 09/20/22 1051 09/21/22 0524  WBC 9.4  --  8.2  RBC 4.33  --  4.08*  HCT 32.4* 34.6* 29.1*  PLT 301  --  300   Recent Labs    09/20/22 0527 09/21/22 0524  NA 136 135  K 4.1 3.7  CL 109 108  CO2 21* 20*  BUN 10 9  CREATININE 0.80 0.81  GLUCOSE 109* 157*  CALCIUM 8.0* 7.7*   No results for input(s): "LABPT", "INR" in the last 72 hours.   EXAM General - Patient is Confused and lethargic Extremity - Sensation intact distally Dorsiflexion/Plantar flexion intact Compartment soft Dressing/Incision - clean, dry, no drainage Motor Function - intact, moving foot and toes well on exam.   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)    HLD (hyperlipidemia)    HTN (hypertension)    Schizophrenia (HCC)     Assessment/Plan: 2 Days Post-Op Procedure(s) (LRB): INTRAMEDULLARY (IM) NAIL INTERTROCHANTERIC (Right) Principal Problem:   Closed right hip fracture (HCC) Active Problems:   Schizophrenia (HCC)   Chronic obstructive pulmonary disease (HCC)   Malnutrition of moderate degree   Eosinophilic asthma   AF (paroxysmal atrial fibrillation) (HCC)   Depression   GERD without esophagitis   Fall   Closed displaced intertrochanteric fracture of right femur (HCC)   Right hip pain  Estimated body mass index is 24.51 kg/m as calculated from the following:   Height as of this encounter: 5\' 9"  (1.753 m).   Weight as of 08/22/22: 75.3 kg. Advance diet Up with therapy  Discharge planning: Follow-up at [facility] clinic orthopedics in 2 weeks for staple removal.  No showering.  Dressing changes needed.  DVT Prophylaxis - Lovenox, Foot Pumps, and TED hose Weight-Bearing as tolerated to right leg  Dedra Skeens, PA-C Orthopaedic Surgery 09/21/2022, 6:53 AM

## 2022-09-21 NOTE — Progress Notes (Signed)
Progress Note   Patient: Adrian Gillespie WUJ:[id number] DOB: 1950/08/12 DOA: 09/19/2022     2 DOS: the patient was seen and examined on 09/21/2022   Brief hospital course: Taken from H&P.  Adrian Gillespie is a 72 y.o. Caucasian male with medical history significant for COPD, GERD, type pretension, dyslipidemia, schizophrenia, anxiety and depression, presented to the emergency room with acute onset of witnessed accidental mechanical fall with subsequent right hip pain.   On arrival hemodynamically stable, hip x-ray with a right mildly displaced intertrochanteric fracture.  Patient was on Eliquis which was held. Orthopedic was consulted.  8/23: Vital stable, hemoglobin stable as compared to admission at 10.7, decreased from 4-week check of 12.3.  Iron studies done last month by PCP with concern of anemia of chronic disease with some iron deficiency, patient was asked to have colonoscopy as there is an history of prior polyp, not done yet.   Orthopedic is taking him to the OR later today for ORIF. Patient was not on any anticoagulation at this time-verified by pharmacy from his facility Patient is from an assisted living with memory care unit.  8/24: Labs and vital stable.  Unable to void after the surgery requiring In-N-Out catheter with drainage of more than 600 mL of urine.  Patient is incontinent at baseline.  Lives in a memory care unit.  Need to go to rehab.  8/25: Vital stable.  Still unable to void so Foley catheter was placed overnight.  Patient was also started on Flomax and likely be discharged with Foley catheter with outpatient urology follow-up. Hemoglobin with further decreased to 9.4 and CO2 of 20, mild to be due to continuation of normal saline which was discontinued.   Assessment and Plan: * Closed right hip fracture (HCC) - This is secondary to a mechanical fall. S/p ORIF.  Patient was not on any anticoagulation per facility.  Verified by our pharmacy. -PT is recommending  rehab. -Continue with supportive care and pain management -Patient will be weightbearing as tolerated per orthopedic surgery  Chronic obstructive pulmonary disease (HCC) Stable.  No concern of exacerbation at this time - We will continue his inhalers and place him on as needed albuterol.  AF (paroxysmal atrial fibrillation) (HCC) Currently in normal sinus rhythm, not on any rate control medication or other A-fib related medications at home.  Not sure whether he was taking Eliquis but it was listed in his home medications-pharmacy to verify -Holding Eliquis for surgery -We can resume once cleared from orthopedic  Schizophrenia (HCC) - We will continue his Haldol and Cogentin.  Depression - We will continue trazodone.  GERD without esophagitis - We will continue PPI therapy.  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      Subjective: Patient was sitting comfortably in chair when seen today.  Denies any pain.  Physical Exam: Vitals:   09/21/22 0007 09/21/22 0033 09/21/22 0454 09/21/22 0826  BP: 100/82 106/77 119/74 111/81  Pulse: (!) 102 (!) 102 94 90  Resp: 20  16 16   Temp: 99.1 F (37.3 C)  99.3 F (37.4 C) 99.3 F (37.4 C)  TempSrc:      SpO2: 91% 94% 96% 93%  Height:       General.  Frail elderly man, in no acute distress. Pulmonary.  Lungs clear bilaterally, normal respiratory effort. CV.  Regular rate and rhythm, no JVD, rub or murmur. Abdomen.  Soft, nontender, nondistended, BS positive. CNS.  Alert and oriented  x 2.  No focal neurologic deficit. Extremities.  No edema, no cyanosis, pulses intact and symmetrical. Psychiatry.  Appears to have some cognitive impairment  Data Reviewed: Prior data reviewed  Family Communication: Discussed with sister on phone  Disposition: Status is: Inpatient Remains inpatient appropriate because: Severity of illness  Planned Discharge Destination: Skilled nursing  facility  Time spent:42  minutes  This record has been created using Conservation officer, historic buildings. Errors have been sought and corrected,but may not always be located. Such creation errors do not reflect on the standard of care.   Author: Arnetha Courser, MD 09/21/2022 2:36 PM  For on call review [email or web address].

## 2022-09-22 ENCOUNTER — Encounter: Payer: Self-pay | Admitting: Orthopedic Surgery

## 2022-09-22 DIAGNOSIS — I480 Paroxysmal atrial fibrillation: Secondary | ICD-10-CM | POA: Diagnosis not present

## 2022-09-22 DIAGNOSIS — S72001A Fracture of unspecified part of neck of right femur, initial encounter for closed fracture: Secondary | ICD-10-CM | POA: Diagnosis not present

## 2022-09-22 DIAGNOSIS — J449 Chronic obstructive pulmonary disease, unspecified: Secondary | ICD-10-CM | POA: Diagnosis not present

## 2022-09-22 DIAGNOSIS — F209 Schizophrenia, unspecified: Secondary | ICD-10-CM | POA: Diagnosis not present

## 2022-09-22 LAB — CBC
HCT: 29.9 % — ABNORMAL LOW (ref 39.0–52.0)
Hemoglobin: 9.4 g/dL — ABNORMAL LOW (ref 13.0–17.0)
MCH: 22.5 pg — ABNORMAL LOW (ref 26.0–34.0)
MCHC: 31.4 g/dL (ref 30.0–36.0)
MCV: 71.7 fL — ABNORMAL LOW (ref 80.0–100.0)
Platelets: 331 10*3/uL (ref 150–400)
RBC: 4.17 MIL/uL — ABNORMAL LOW (ref 4.22–5.81)
RDW: 18.2 % — ABNORMAL HIGH (ref 11.5–15.5)
WBC: 8.5 10*3/uL (ref 4.0–10.5)
nRBC: 0 % (ref 0.0–0.2)

## 2022-09-22 LAB — BASIC METABOLIC PANEL
Anion gap: 6 (ref 5–15)
BUN: 9 mg/dL (ref 8–23)
CO2: 24 mmol/L (ref 22–32)
Calcium: 8.2 mg/dL — ABNORMAL LOW (ref 8.9–10.3)
Chloride: 107 mmol/L (ref 98–111)
Creatinine, Ser: 0.81 mg/dL (ref 0.61–1.24)
GFR, Estimated: >60 mL/min (ref >60)
Glucose, Bld: 115 mg/dL — ABNORMAL HIGH (ref 70–99)
Potassium: 3.9 mmol/L (ref 3.5–5.1)
Sodium: 137 mmol/L (ref 135–145)

## 2022-09-22 NOTE — TOC Progression Note (Signed)
Transition of Care [facility]) - Progression Note    Patient Details  Name: Adrian Gillespie MRN: [id number] Date of Birth: Jul 14, 1950  Transition of [facility]) CM/SW Contact  Marlowe Sax, RN Phone Number: 09/22/2022, 2:52 PM  Clinical Narrative:    Spoke with the patient and his sister [name] reviewed that we only have one bed offer locally and that Is [facility] health and rehab, they accepted the bed offer, Ins auth pending, I notified [facility] and rehab   Expected Discharge Plan: Skilled Nursing Facility Barriers to Discharge: Continued Medical Work up  Expected Discharge Plan and Services       Living arrangements for the past 2 months: Assisted Living Facility                                       Social Determinants of Health (SDOH) Interventions SDOH Screenings   Food Insecurity: No Food Insecurity (09/19/2022)  Housing: Low Risk  (09/19/2022)  Transportation Needs: No Transportation Needs (09/19/2022)  Utilities: Not At Risk (09/19/2022)  Financial Resource Strain: Medium Risk (06/26/2022)   Received from [facility], [facility]  Physical Activity: Sufficiently Active (06/26/2022)   Received from [facility], [facility]  Social Connections: Moderately Isolated (07/18/2020)   Received from [facility], [facility]  Stress: No Stress Concern Present (06/26/2022)   Received from [facility], [facility]  Tobacco Use: High Risk (09/19/2022)  Health Literacy: High Risk (06/26/2022)   Received from [facility], [facility]    Readmission Risk Interventions    09/20/2022    3:20 PM  Readmission Risk Prevention Plan  Transportation Screening Complete  PCP or Specialist Appt within 3-5 Days Complete  HRI or Home Care Consult Complete  Social Work Consult for Recovery Care Planning/Counseling Complete  Palliative Care Screening Not Applicable  Medication Review Oceanographer) Complete

## 2022-09-22 NOTE — Progress Notes (Signed)
Physical Therapy Treatment Patient Details Name: Adrian Gillespie MRN: [id number] DOB: Jan 26, 1951 Today's Date: 09/22/2022   History of Present Illness Adrian Gillespie is a 72 yo white male that presents for 2nd treatment session of day POD #2 for right intramedullary fixation of right hip for mechanical fall that resulted in right hip fracture. PMH includes COPD, GERD, dyslipedmia, schizophrenia, anxiety, and depression.    PT Comments  Pt received in bed, declined pain meds prior to session. MaxA to transfer supine to sitting EOB due to 9/10 pain R hip. Pt educated on benefits of pre-medicating prior to PT session, nursing in to address pain. Several sit to stand transfers with ModA from raised bed to RW prior to side stepping towards Left side to transfer to chair. Pt required cues for safe technique to prevent fall/LOB. Positioned to comfort in recliner with all needs met. Will reassess mobility after pt receives pain meds in pm.   If plan is discharge home, recommend the following: A lot of help with walking and/or transfers;A lot of help with bathing/dressing/bathroom   Can travel by private vehicle     No  Equipment Recommendations  None recommended by PT (TBD at next facility)    Recommendations for Other Services       Precautions / Restrictions Precautions Precautions: None Restrictions Weight Bearing Restrictions: Yes RLE Weight Bearing: Weight bearing as tolerated     Mobility  Bed Mobility Overal bed mobility: Needs Assistance Bed Mobility: Supine to Sit     Supine to sit: Max assist     General bed mobility comments: MaxA to transfer and scoot to EOB due to pain    Transfers Overall transfer level: Needs assistance Equipment used: Rolling walker (2 wheels) Transfers: Sit to/from Stand Sit to Stand: Mod assist, From elevated surface           General transfer comment: Several sit to stands at EOB to build confidence prior to side stepping towards bedside  chair    Ambulation/Gait Ambulation/Gait assistance: Mod assist Gait Distance (Feet): 3 Feet Assistive device: Rolling walker (2 wheels) Gait Pattern/deviations: Step-to pattern, Decreased stance time - right, Antalgic       General Gait Details: Poor tolerance in wt shifting to R in standing. Pt able to take a few side steps to Right with heavy reliance of RW   Stairs             Wheelchair Mobility     Tilt Bed    Modified Rankin (Stroke Patients Only)       Balance Overall balance assessment: Needs assistance Sitting-balance support: Bilateral upper extremity supported, Feet supported Sitting balance-Leahy Scale: Good     Standing balance support: Bilateral upper extremity supported, During functional activity, Reliant on assistive device for balance Standing balance-Leahy Scale: Fair Standing balance comment: Fair Balance during "static" standing with RW                            Cognition Arousal: Alert Behavior During Therapy: WFL for tasks assessed/performed Overall Cognitive Status: History of cognitive impairments - at baseline                                 General Comments: Very pleasant and motivated        Exercises General Exercises - Lower Extremity Ankle Circles/Pumps: AROM, Both, 10 reps, Supine Long Arc Quad: AAROM, Right,  5 reps, Seated Heel Slides: AAROM, Right, 5 reps, Supine Hip ABduction/ADduction: AAROM, Right, 5 reps, Supine    General Comments General comments (skin integrity, edema, etc.): R hip dressing intact, pt declined pain meds prior to session, educating provided on benefits of pre-medicating prior to PT session      Pertinent Vitals/Pain Pain Assessment Pain Assessment: 0-10 Pain Score: 9  Pain Location: Right hip Pain Descriptors / Indicators: Aching Pain Intervention(s): Patient requesting pain meds-RN notified, Ice applied    Home Living                          Prior  Function            PT Goals (current goals can now be found in the care plan section) Acute Rehab PT Goals Patient Stated Goal: To leave hospital and return to assisted living facility. Progress towards PT goals: Progressing toward goals    Frequency    BID      PT Plan      Co-evaluation              AM-PAC PT "6 Clicks" Mobility   Outcome Measure  Help needed turning from your back to your side while in a flat bed without using bedrails?: A Lot Help needed moving from lying on your back to sitting on the side of a flat bed without using bedrails?: Total Help needed moving to and from a bed to a chair (including a wheelchair)?: A Lot Help needed standing up from a chair using your arms (e.g., wheelchair or bedside chair)?: A Lot Help needed to walk in hospital room?: Total Help needed climbing 3-5 steps with a railing? : Total 6 Click Score: 9    End of Session Equipment Utilized During Treatment: Gait belt Activity Tolerance: Patient limited by pain Patient left: in chair;with call bell/phone within reach;with chair alarm set;with nursing/sitter in room Nurse Communication: Mobility status;Patient requests pain meds PT Visit Diagnosis: Repeated falls (R29.6);Pain;Other abnormalities of gait and mobility (R26.89) Pain - Right/Left: Right Pain - part of body: Hip     Time: 1610-9604 PT Time Calculation (min) (ACUTE ONLY): 33 min  Charges:    $Therapeutic Exercise: 8-22 mins $Therapeutic Activity: 8-22 mins PT General Charges $$ ACUTE PT VISIT: 1 Visit                    Zadie Cleverly, PTA  Jannet Askew 09/22/2022, 11:04 AM

## 2022-09-22 NOTE — Progress Notes (Signed)
Physical Therapy Treatment Patient Details Name: Adrian Gillespie MRN: [id number] DOB: April 15, 1950 Today's Date: 09/22/2022   History of Present Illness Adrian Gillespie is a 72 yo white male that presents for 2nd treatment session of day POD #2 for right intramedullary fixation of right hip for mechanical fall that resulted in right hip fracture. PMH includes COPD, GERD, dyslipedmia, schizophrenia, anxiety, and depression.    PT Comments  Pt sat up in chair for 2+ hours and requesting to return to bed. Chair repositioned to allow pt to transfer towards Left side and facilitate ability to side step with RW. MaxA to stand from recliner with Min/ModA to side step with heavy reliance on RW to offload weight through R LE due to pain. Pt however tolerated session well and remains very motivated and cooperative throughout session. Will pre-medicate prior to PT next attempt.   If plan is discharge home, recommend the following: A lot of help with walking and/or transfers;A lot of help with bathing/dressing/bathroom   Can travel by private vehicle     No  Equipment Recommendations  None recommended by PT    Recommendations for Other Services       Precautions / Restrictions Precautions Precautions: None Restrictions Weight Bearing Restrictions: Yes RLE Weight Bearing: Weight bearing as tolerated     Mobility  Bed Mobility Overal bed mobility: Needs Assistance Bed Mobility: Sit to Supine     Supine to sit: Max assist, HOB elevated Sit to supine: Mod assist (for B LE's)   General bed mobility comments: MaxA to transfer and scoot to EOB due to pain    Transfers Overall transfer level: Needs assistance Equipment used: Rolling walker (2 wheels) Transfers: Sit to/from Stand Sit to Stand: Max assist (from recliner)           General transfer comment:  (Several attempts to stand from recliner before attaining upright stance)    Ambulation/Gait Ambulation/Gait assistance: Mod  assist Gait Distance (Feet): 3 Feet Assistive device: Rolling walker (2 wheels) Gait Pattern/deviations: Step-to pattern, Decreased stance time - right, Antalgic       General Gait Details: Poor tolerance in wt shifting to R in standing. Pt able to take a few side steps to Right with heavy reliance of RW   Stairs             Wheelchair Mobility     Tilt Bed    Modified Rankin (Stroke Patients Only)       Balance Overall balance assessment: Needs assistance Sitting-balance support: Bilateral upper extremity supported, Feet supported Sitting balance-Leahy Scale: Good     Standing balance support: Bilateral upper extremity supported, During functional activity, Reliant on assistive device for balance Standing balance-Leahy Scale: Fair Standing balance comment: Fair Balance during "static" standing with RW                            Cognition Arousal: Alert Behavior During Therapy: WFL for tasks assessed/performed Overall Cognitive Status: History of cognitive impairments - at baseline                                 General Comments: Very pleasant and motivated        Exercises General Exercises - Lower Extremity Ankle Circles/Pumps: AROM, Both, 10 reps, Supine Long Arc Quad: AAROM, Right, 5 reps, Seated Heel Slides: AAROM, Right, 5 reps, Supine Hip ABduction/ADduction: AAROM, Right, 5  reps, Supine    General Comments General comments (skin integrity, edema, etc.): Pt educated on hand placement and weight shifting to raise to standing at RW with MaxA. Cues given to safely side step to Left with support of RW and Min/ModA to prevent LOB.      Pertinent Vitals/Pain Pain Assessment Pain Assessment: 0-10 Pain Score: 8  Pain Location: Right hip Pain Descriptors / Indicators: Aching, Grimacing Pain Intervention(s): Limited activity within patient's tolerance    Home Living                          Prior Function             PT Goals (current goals can now be found in the care plan section) Acute Rehab PT Goals Patient Stated Goal: To leave hospital and return to assisted living facility. Progress towards PT goals: Progressing toward goals    Frequency    BID      PT Plan      Co-evaluation              AM-PAC PT "6 Clicks" Mobility   Outcome Measure  Help needed turning from your back to your side while in a flat bed without using bedrails?: A Lot Help needed moving from lying on your back to sitting on the side of a flat bed without using bedrails?: Total Help needed moving to and from a bed to a chair (including a wheelchair)?: A Lot Help needed standing up from a chair using your arms (e.g., wheelchair or bedside chair)?: A Lot Help needed to walk in hospital room?: Total Help needed climbing 3-5 steps with a railing? : Total 6 Click Score: 9    End of Session Equipment Utilized During Treatment: Gait belt Activity Tolerance: Patient limited by pain Patient left: in bed;with call bell/phone within reach;with bed alarm set;with nursing/sitter in room Nurse Communication: Mobility status;Patient requests pain meds PT Visit Diagnosis: Repeated falls (R29.6);Pain;Other abnormalities of gait and mobility (R26.89) Pain - Right/Left: Right Pain - part of body: Hip     Time: 6644-0347 PT Time Calculation (min) (ACUTE ONLY): 24 min  Charges:    $Therapeutic Exercise: 8-22 mins $Therapeutic Activity: 8-22 mins PT General Charges $$ ACUTE PT VISIT: 1 Visit                    Zadie Cleverly, PTA  Jannet Askew 09/22/2022, 3:01 PM

## 2022-09-22 NOTE — Progress Notes (Addendum)
   Subjective: 3 Days Post-Op Procedure(s) (LRB): INTRAMEDULLARY (IM) NAIL INTERTROCHANTERIC (Right) Patient reports pain as mild.   Patient is well, and has had no acute complaints or problems Denies any CP, SOB, ABD pain. We will continue therapy today.  Plan is to go Skilled nursing facility after hospital stay.  Objective: Vital signs in last 24 hours: Temp:  [98 F (36.7 C)-99.1 F (37.3 C)] 98.3 F (36.8 C) (08/26 0800) Pulse Rate:  [75-82] 79 (08/26 0800) Resp:  [16-20] 17 (08/26 0800) BP: (103-113)/(69-76) 113/76 (08/26 0800) SpO2:  [92 %-94 %] 94 % (08/26 0800) Weight:  [78.2 kg] 78.2 kg (08/26 0500)  Intake/Output from previous day: 08/25 0701 - 08/26 0700 In: -  Out: 2050 [Urine:2050] Intake/Output this shift: No intake/output data recorded.  Recent Labs    09/20/22 0527 09/20/22 1051 09/21/22 0524 09/22/22 0531  HGB 10.0* 10.5* 9.4* 9.4*   Recent Labs    09/21/22 0524 09/22/22 0531  WBC 8.2 8.5  RBC 4.08* 4.17*  HCT 29.1* 29.9*  PLT 300 331   Recent Labs    09/21/22 0524 09/22/22 0531  NA 135 137  K 3.7 3.9  CL 108 107  CO2 20* 24  BUN 9 9  CREATININE 0.81 0.81  GLUCOSE 157* 115*  CALCIUM 7.7* 8.2*   No results for input(s): "LABPT", "INR" in the last 72 hours.  EXAM General - Patient is Alert, Appropriate, and Oriented Extremity - Neurovascular intact Sensation intact distally Intact pulses distally Dorsiflexion/Plantar flexion intact No cellulitis present Compartment soft Dressing - dressing C/D/I and no drainage Motor Function - intact, moving foot and toes well on exam.   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)    HLD (hyperlipidemia)    HTN (hypertension)    Schizophrenia (HCC)     Assessment/Plan:   3  Days Post-Op Procedure(s) (LRB): INTRAMEDULLARY (IM) NAIL INTERTROCHANTERIC (Right) Principal Problem:   Closed right hip fracture (HCC) Active Problems:   Schizophrenia (HCC)   Chronic obstructive pulmonary disease (HCC)   Malnutrition of moderate degree   Eosinophilic asthma   AF (paroxysmal atrial fibrillation) (HCC)   Depression   GERD without esophagitis   Fall   Closed displaced intertrochanteric fracture of right femur (HCC)   Right hip pain  Estimated body mass index is 25.46 kg/m as calculated from the following:   Height as of this encounter: 5\' 9"  (1.753 m).   Weight as of this encounter: 78.2 kg. Advance diet Up with therapy Pain well controlled Labs and VSS CM to assist with discharge to SNF  Follow up with [facility] in 2 weeks Lovenox 40 mg SQ daily x 14 days at discharge  DVT Prophylaxis - Lovenox, TED hose, and SCDs Weight-Bearing as tolerated to right leg   T. Cranston Neighbor, PA-C [facility] 09/22/2022, 10:10 AM   Patient seen and examined, agree with above plan.  The patient is doing well status post right hip intramedullary nail, no concerns at this time.  Pain is controlled.  Continue with physical therapy with likely plan to dc to SNF.   Reinaldo Berber MD

## 2022-09-22 NOTE — Care Management Important Message (Signed)
Important Message  Patient Details  Name: Adrian Gillespie MRN: [id number] Date of Birth: 1950/05/07   Medicare Important Message Given:  N/A - LOS <3 / Initial given by admissions     Olegario Messier A Raynisha Avilla 09/22/2022, 9:43 AM

## 2022-09-22 NOTE — Plan of Care (Signed)
  Problem: Health Behavior/Discharge Planning: Goal: Ability to manage health-related needs will improve Outcome: Progressing   Problem: Clinical Measurements: Goal: Ability to maintain clinical measurements within normal limits will improve Outcome: Progressing Goal: Will remain free from infection Outcome: Progressing Goal: Diagnostic test results will improve Outcome: Progressing   Problem: Activity: Goal: Risk for activity intolerance will decrease Outcome: Progressing   Problem: Nutrition: Goal: Adequate nutrition will be maintained Outcome: Progressing   Problem: Elimination: Goal: Will not experience complications related to bowel motility Outcome: Progressing Goal: Will not experience complications related to urinary retention Outcome: Progressing   Problem: Pain Managment: Goal: General experience of comfort will improve Outcome: Progressing

## 2022-09-22 NOTE — Plan of Care (Signed)
  Problem: Education: Goal: Knowledge of General Education information will improve Description: Including pain rating scale, medication(s)/side effects and non-pharmacologic comfort measures Outcome: Progressing   Problem: Nutrition: Goal: Adequate nutrition will be maintained Outcome: Progressing   Problem: Coping: Goal: Level of anxiety will decrease Outcome: Progressing   Problem: Elimination: Goal: Will not experience complications related to bowel motility Outcome: Progressing Goal: Will not experience complications related to urinary retention Outcome: Progressing   Problem: Pain Managment: Goal: General experience of comfort will improve Outcome: Progressing   Problem: Safety: Goal: Ability to remain free from injury will improve Outcome: Progressing   

## 2022-09-22 NOTE — Progress Notes (Signed)
Progress Note   Patient: Adrian Gillespie NWG:[id number] DOB: 1950-08-21 DOA: 09/19/2022     3 DOS: the patient was seen and examined on 09/22/2022   Brief hospital course: Taken from H&P.  Kenney Lubeck is a 72 y.o. Caucasian male with medical history significant for COPD, GERD, type pretension, dyslipidemia, schizophrenia, anxiety and depression, presented to the emergency room with acute onset of witnessed accidental mechanical fall with subsequent right hip pain.   On arrival hemodynamically stable, hip x-ray with a right mildly displaced intertrochanteric fracture.  Patient was on Eliquis which was held. Orthopedic was consulted.  8/23: Vital stable, hemoglobin stable as compared to admission at 10.7, decreased from 4-week check of 12.3.  Iron studies done last month by PCP with concern of anemia of chronic disease with some iron deficiency, patient was asked to have colonoscopy as there is an history of prior polyp, not done yet.   Orthopedic is taking him to the OR later today for ORIF. Patient was not on any anticoagulation at this time-verified by pharmacy from his facility Patient is from an assisted living with memory care unit.  8/24: Labs and vital stable.  Unable to void after the surgery requiring In-N-Out catheter with drainage of more than 600 mL of urine.  Patient is incontinent at baseline.  Lives in a memory care unit.  Need to go to rehab.  8/25: Vital stable.  Still unable to void so Foley catheter was placed overnight.  Patient was also started on Flomax and likely be discharged with Foley catheter with outpatient urology follow-up. Hemoglobin with further decreased to 9.4 and CO2 of 20, mild to be due to continuation of normal saline which was discontinued.  8/26: Hemodynamically stable.  Accidentally pulled his Foley catheter out yesterday, no injuries, apparently the balloon was not inflated.  Able to void once after that.  Need to monitor with bladder scan for any  significant retention. Medically stable-awaiting disposition   Assessment and Plan: * Closed right hip fracture (HCC) - This is secondary to a mechanical fall. S/p ORIF.  Patient was not on any anticoagulation per facility.  Verified by our pharmacy. -PT is recommending rehab. -Continue with supportive care and pain management -Patient will be weightbearing as tolerated per orthopedic surgery  Chronic obstructive pulmonary disease (HCC) Stable.  No concern of exacerbation at this time - We will continue his inhalers and place him on as needed albuterol.  AF (paroxysmal atrial fibrillation) (HCC) Currently in normal sinus rhythm, not on any rate control medication or other A-fib related medications at home.  Not sure whether he was taking Eliquis but it was listed in his home medications-pharmacy to verify -Holding Eliquis for surgery -We can resume once cleared from orthopedic  Schizophrenia (HCC) - We will continue his Haldol and Cogentin.  Depression - We will continue trazodone.  GERD without esophagitis - We will continue PPI therapy.  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      Subjective: Patient was sitting in chair when seen today.  Per patient he is still having some difficulty with urination but does not want to replace Foley at this time.  Physical Exam: Vitals:   09/21/22 2318 09/22/22 0424 09/22/22 0500 09/22/22 0800  BP: 103/69 104/75  113/76  Pulse: 80 75  79  Resp: 20 20  17   Temp: 99.1 F (37.3 C) 98.9 F (37.2 C)  98.3 F (36.8 C)  TempSrc:  SpO2: 92% 92%  94%  Weight:   78.2 kg   Height:       General.  Frail elderly man, in no acute distress. Pulmonary.  Lungs clear bilaterally, normal respiratory effort. CV.  Regular rate and rhythm, no JVD, rub or murmur. Abdomen.  Soft, nontender, nondistended, BS positive. CNS.  Alert and oriented .  No focal neurologic deficit. Extremities.  No  edema, no cyanosis, pulses intact and symmetrical.  Data Reviewed: Prior data reviewed  Family Communication: Talked with sister on phone.  Disposition: Status is: Inpatient Remains inpatient appropriate because: Severity of illness  Planned Discharge Destination: Skilled nursing facility  Time spent:40 minutes  This record has been created using Conservation officer, historic buildings. Errors have been sought and corrected,but may not always be located. Such creation errors do not reflect on the standard of care.   Author: Arnetha Courser, MD 09/22/2022 1:11 PM  For on call review [email or web address].

## 2022-09-23 DIAGNOSIS — S72001A Fracture of unspecified part of neck of right femur, initial encounter for closed fracture: Secondary | ICD-10-CM | POA: Diagnosis not present

## 2022-09-23 DIAGNOSIS — J449 Chronic obstructive pulmonary disease, unspecified: Secondary | ICD-10-CM | POA: Diagnosis not present

## 2022-09-23 DIAGNOSIS — K219 Gastro-esophageal reflux disease without esophagitis: Secondary | ICD-10-CM

## 2022-09-23 DIAGNOSIS — F209 Schizophrenia, unspecified: Secondary | ICD-10-CM | POA: Diagnosis not present

## 2022-09-23 DIAGNOSIS — I480 Paroxysmal atrial fibrillation: Secondary | ICD-10-CM | POA: Diagnosis not present

## 2022-09-23 MED ORDER — FE FUM-VIT C-VIT B12-FA 460-60-0.01-1 MG PO CAPS: 1.0000 | ORAL_CAPSULE | Freq: Two times a day (BID) | ORAL | Status: AC

## 2022-09-23 MED ORDER — CHLORHEXIDINE GLUCONATE CLOTH 2 % EX PADS
6.0000 | MEDICATED_PAD | Freq: Every day | CUTANEOUS | Status: DC
  Administered 2022-09-23: 6 via TOPICAL

## 2022-09-23 MED ORDER — TAMSULOSIN HCL 0.4 MG PO CAPS: 0.4000 mg | ORAL_CAPSULE | Freq: Every day | ORAL | Status: AC

## 2022-09-23 MED ORDER — ENOXAPARIN SODIUM 40 MG/0.4ML IJ SOSY: 40.0000 mg | PREFILLED_SYRINGE | INTRAMUSCULAR | Status: DC

## 2022-09-23 MED ORDER — ADULT MULTIVITAMIN W/MINERALS CH: 1.0000 | ORAL_TABLET | Freq: Every day | ORAL | Status: AC

## 2022-09-23 MED ORDER — MAGNESIUM HYDROXIDE 400 MG/5ML PO SUSP: 30.0000 mL | Freq: Every day | ORAL | Status: AC | PRN

## 2022-09-23 NOTE — Care Management Important Message (Signed)
Important Message  Patient Details  Name: Adrian Gillespie MRN: [id number] Date of Birth: 1950/12/26   Medicare Important Message Given:  N/A - LOS <3 / Initial given by admissions     Olegario Messier A [name] 09/23/2022, 9:32 AM

## 2022-09-23 NOTE — Plan of Care (Signed)
  Problem: Health Behavior/Discharge Planning: Goal: Ability to manage health-related needs will improve Outcome: Progressing   Problem: Clinical Measurements: Goal: Will remain free from infection Outcome: Progressing Goal: Diagnostic test results will improve Outcome: Progressing Goal: Cardiovascular complication will be avoided Outcome: Progressing   Problem: Activity: Goal: Risk for activity intolerance will decrease Outcome: Progressing   Problem: Nutrition: Goal: Adequate nutrition will be maintained Outcome: Progressing   Problem: Elimination: Goal: Will not experience complications related to bowel motility Outcome: Progressing Goal: Will not experience complications related to urinary retention Outcome: Progressing   Problem: Pain Managment: Goal: General experience of comfort will improve Outcome: Progressing

## 2022-09-23 NOTE — Progress Notes (Signed)
   Subjective: 4 Days Post-Op Procedure(s) (LRB): INTRAMEDULLARY (IM) NAIL INTERTROCHANTERIC (Right) Patient reports pain as mild.   Patient is well, and has had no acute complaints or problems Denies any CP, SOB, ABD pain. We will continue therapy today.  Plan is to go Skilled nursing facility after hospital stay.  Objective: Vital signs in last 24 hours: Temp:  [97.2 F (36.2 C)-99 F (37.2 C)] 97.2 F (36.2 C) (08/27 0806) Pulse Rate:  [69-81] 81 (08/27 0806) Resp:  [16-20] 16 (08/27 0806) BP: (96-108)/(58-82) 104/80 (08/27 0806) SpO2:  [91 %-94 %] 94 % (08/27 0806) Weight:  [78.4 kg] 78.4 kg (08/27 0500)  Intake/Output from previous day: 08/26 0701 - 08/27 0700 In: 240 [P.O.:240] Out: 1900 [Urine:1900] Intake/Output this shift: Total I/O In: 240 [P.O.:240] Out: -   Recent Labs    09/21/22 0524 09/22/22 0531  HGB 9.4* 9.4*   Recent Labs    09/21/22 0524 09/22/22 0531  WBC 8.2 8.5  RBC 4.08* 4.17*  HCT 29.1* 29.9*  PLT 300 331   Recent Labs    09/21/22 0524 09/22/22 0531  NA 135 137  K 3.7 3.9  CL 108 107  CO2 20* 24  BUN 9 9  CREATININE 0.81 0.81  GLUCOSE 157* 115*  CALCIUM 7.7* 8.2*   No results for input(s): "LABPT", "INR" in the last 72 hours.  EXAM General - Patient is Alert, Appropriate, and Oriented Extremity - Neurovascular intact Sensation intact distally Intact pulses distally Dorsiflexion/Plantar flexion intact No cellulitis present Compartment soft Dressing - dressing C/D/I and no drainage Motor Function - intact, moving foot and toes well on exam.   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)    HLD (hyperlipidemia)    HTN (hypertension)    Schizophrenia (HCC)     Assessment/Plan:   4 Days Post-Op Procedure(s)  (LRB): INTRAMEDULLARY (IM) NAIL INTERTROCHANTERIC (Right) Principal Problem:   Closed right hip fracture (HCC) Active Problems:   Schizophrenia (HCC)   Chronic obstructive pulmonary disease (HCC)   Malnutrition of moderate degree   Eosinophilic asthma   AF (paroxysmal atrial fibrillation) (HCC)   Depression   GERD without esophagitis   Fall   Closed displaced intertrochanteric fracture of right femur (HCC)   Right hip pain  Estimated body mass index is 25.52 kg/m as calculated from the following:   Height as of this encounter: 5\' 9"  (1.753 m).   Weight as of this encounter: 78.4 kg. Advance diet Up with therapy Pain well controlled Labs and VSS CM to assist with discharge to SNF  Follow up with [facility] in 2 weeks Lovenox 40 mg SQ daily x 14 days at discharge  DVT Prophylaxis - Lovenox, TED hose, and SCDs Weight-Bearing as tolerated to right leg   T. Cranston Neighbor, PA-C [facility] 09/23/2022, 12:03 PM

## 2022-09-23 NOTE — TOC Progression Note (Signed)
Transition of Care [facility]) - Progression Note    Patient Details  Name: Adrian Gillespie MRN: [id number] Date of Birth: September 23, 1950  Transition of [facility]) CM/SW Contact  Marlowe Sax, RN Phone Number: 09/23/2022, 1:43 PM  Clinical Narrative:     Hebert Soho and let her know that he will go to room 1005 at [facility] called to transport  There are 4 ahead of him     Living arrangements for the past 2 months: Assisted Living Facility Expected Discharge Date: 09/23/22                                     Social Determinants of Health (SDOH) Interventions SDOH Screenings   Food Insecurity: No Food Insecurity (09/19/2022)  Housing: Low Risk  (09/19/2022)  Transportation Needs: No Transportation Needs (09/19/2022)  Utilities: Not At Risk (09/19/2022)  Financial Resource Strain: Medium Risk (06/26/2022)   Received from [facility], [facility]  Physical Activity: Sufficiently Active (06/26/2022)   Received from [facility], [facility]  Social Connections: Moderately Isolated (07/18/2020)   Received from [facility], [facility]  Stress: No Stress Concern Present (06/26/2022)   Received from [facility], [facility]  Tobacco Use: High Risk (09/19/2022)  Health Literacy: High Risk (06/26/2022)   Received from [facility], [facility]    Readmission Risk Interventions    09/20/2022    3:20 PM  Readmission Risk Prevention Plan  Transportation Screening Complete  PCP or Specialist Appt within 3-5 Days Complete  HRI or Home Care Consult Complete  Social Work Consult for Recovery Care Planning/Counseling Complete  Palliative Care Screening Not Applicable  Medication Review [employer]) Complete

## 2022-09-23 NOTE — Discharge Summary (Addendum)
Physician Discharge Summary   Patient: Adrian Gillespie MRN: [id number] DOB: 10/09/1950  Admit date:     09/19/2022  Discharge date: 09/23/22  Discharge Physician: Arnetha Courser   PCP: Andres Shad, MD   Recommendations at discharge:  Please obtain CBC and BMP in 1 week Follow-up with orthopedic surgery Follow-up with primary care provider Patient is being discharged with Foley catheter in place as unable to void, need to see outpatient urology for further recommendations.  He was also started on Flomax.  Discharge Diagnoses: Principal Problem:   Closed right hip fracture (HCC) Active Problems:   Chronic obstructive pulmonary disease (HCC)   AF (paroxysmal atrial fibrillation) (HCC)   Schizophrenia (HCC)   Depression   GERD without esophagitis   Eosinophilic asthma   Malnutrition of moderate degree   Fall   Closed displaced intertrochanteric fracture of right femur (HCC)   Right hip pain   Hospital Course: Taken from H&P.  Adrian Gillespie is a 72 y.o. Caucasian male with medical history significant for COPD, GERD, type pretension, dyslipidemia, schizophrenia, anxiety and depression, presented to the emergency room with acute onset of witnessed accidental mechanical fall with subsequent right hip pain.   On arrival hemodynamically stable, hip x-ray with a right mildly displaced intertrochanteric fracture.  Patient was on Eliquis which was held. Orthopedic was consulted.  8/23: Vital stable, hemoglobin stable as compared to admission at 10.7, decreased from 4-week check of 12.3.  Iron studies done last month by PCP with concern of anemia of chronic disease with some iron deficiency, patient was asked to have colonoscopy as there is an history of prior polyp, not done yet.   Orthopedic is taking him to the OR later today for ORIF. Patient was not on any anticoagulation at this time-verified by pharmacy from his facility Patient is from an assisted living with memory care  unit.  8/24: Labs and vital stable.  Unable to void after the surgery requiring In-N-Out catheter with drainage of more than 600 mL of urine.  Patient is incontinent at baseline.  Lives in a memory care unit.  Need to go to rehab.  8/25: Vital stable.  Still unable to void so Foley catheter was placed overnight.  Patient was also started on Flomax and likely be discharged with Foley catheter with outpatient urology follow-up. Hemoglobin with further decreased to 9.4 and CO2 of 20, mild to be due to continuation of normal saline which was discontinued.  8/26: Hemodynamically stable.  Accidentally pulled his Foley catheter out yesterday, no injuries, apparently the balloon was not inflated.  Able to void once after that.  Need to monitor with bladder scan for any significant retention. Medically stable-awaiting disposition.  8/27: Remain medically stable.  Able to void now.  Got insurance approval for rehab where he is being discharged for further management.  Patient will continue with Flomax for concern of BPH causing urinary obstruction. Patient again unable to void despite multiple attempts on the day of discharge so Foley catheter was replaced.  Patient is being discharged with Foley catheter and need to have outpatient follow-up with urology for further recommendations and management.  Patient will continue on current medications and need to have a close follow-up with his providers for further care.  Assessment and Plan: * Closed right hip fracture (HCC) - This is secondary to a mechanical fall. S/p ORIF.  Patient was not on any anticoagulation per facility.  Verified by our pharmacy. -PT is recommending rehab. -Continue with supportive care and pain  management -Patient will be weightbearing as tolerated per orthopedic surgery  Chronic obstructive pulmonary disease (HCC) Stable.  No concern of exacerbation at this time - We will continue his inhalers and place him on as needed  albuterol.  AF (paroxysmal atrial fibrillation) (HCC) Currently in normal sinus rhythm, not on any rate control medication or other A-fib related medications at home.  Patient was not taking Eliquis before coming to the hospital.  Schizophrenia [facility]) - We will continue his Haldol and Cogentin.  Depression - We will continue trazodone.  GERD without esophagitis - We will continue PPI therapy.  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      Pain control - [employer] Controlled Substance Reporting System database was reviewed. and patient was instructed, not to drive, operate heavy machinery, perform activities at heights, swimming or participation in water activities or provide baby-sitting services while on Pain, Sleep and Anxiety Medications; until their outpatient Physician has advised to do so again. Also recommended to not to take more than prescribed Pain, Sleep and Anxiety Medications.  Consultants: Orthopedic surgery Procedures performed: ORIF Disposition: Skilled nursing facility Diet recommendation:  Discharge Diet Orders (From admission, onward)     Start     Ordered   09/23/22 0000  Diet - low sodium heart healthy        09/23/22 1137           Regular diet DISCHARGE MEDICATION: Allergies as of 09/23/2022   No Known Allergies      Medication List     STOP taking these medications    apixaban 5 MG Tabs tablet Commonly known as: ELIQUIS   benzonatate 100 MG capsule Commonly known as: TESSALON   oxyCODONE 5 MG immediate release tablet Commonly known as: Oxy IR/ROXICODONE       TAKE these medications    acetaminophen 325 MG tablet Commonly known as: TYLENOL Take 2 tablets (650 mg total) by mouth every 6 (six) hours as needed for mild pain or moderate pain.   Advair HFA 45-21 MCG/ACT inhaler Generic drug: fluticasone-salmeterol Inhale 2 puffs into the lungs 2 (two) times daily.    albuterol 108 (90 Base) MCG/ACT inhaler Commonly known as: VENTOLIN HFA Inhale 2 puffs into the lungs every 6 (six) hours as needed for shortness of breath or wheezing.   benztropine 1 MG tablet Commonly known as: COGENTIN Take 1 mg by mouth daily.   Bevespi Aerosphere 9-4.8 MCG/ACT Aero Generic drug: Glycopyrrolate-Formoterol Inhale 2 puffs into the lungs at bedtime.   enoxaparin 40 MG/0.4ML injection Commonly known as: LOVENOX Inject 0.4 mLs (40 mg total) into the skin daily for 14 days. Start taking on: September 24, 2022   Fe Fum-Vit C-Vit B12-FA Caps capsule Commonly known as: TRIGELS-F FORTE Take 1 capsule by mouth 2 (two) times daily.   feeding supplement Liqd Take 237 mLs by mouth 2 (two) times daily between meals.   haloperidol 10 MG tablet Commonly known as: HALDOL Take 10 mg by mouth 2 (two) times daily.   HYDROcodone-acetaminophen 5-325 MG tablet Commonly known as: NORCO/VICODIN Take 1-2 tablets by mouth every 4 (four) hours as needed for moderate pain (pain score 4-6).   hydrOXYzine 50 MG capsule Commonly known as: VISTARIL Take 50 mg by mouth 2 (two) times daily as needed for anxiety.   [name] Sustenna 234 MG/1.5ML injection Generic drug: paliperidone Inject 234 mg into the muscle every 30 (thirty) days.   magnesium hydroxide 400  MG/5ML suspension Commonly known as: MILK OF MAGNESIA Take 30 mLs by mouth daily as needed for mild constipation.   montelukast 10 MG tablet Commonly known as: SINGULAIR Take 10 mg by mouth daily.   multivitamin with minerals Tabs tablet Take 1 tablet by mouth daily. Start taking on: September 24, 2022   naloxone 4 MG/0.1ML Liqd nasal spray kit Commonly known as: NARCAN One spray in nostril for opioid reversal if unresponsive   nicotine 21 mg/24hr patch Commonly known as: NICODERM CQ - dosed in mg/24 hours Place 1 patch (21 mg total) onto the skin daily.   omeprazole 20 MG capsule Commonly known as: PRILOSEC Take 20 mg  by mouth daily.   polyethylene glycol 17 g packet Commonly known as: MIRALAX / GLYCOLAX Take 17 g by mouth daily.   Spiriva HandiHaler 18 MCG inhalation capsule Generic drug: tiotropium Place 1 capsule (18 mcg total) into inhaler and inhale daily.   tamsulosin 0.4 MG Caps capsule Commonly known as: FLOMAX Take 1 capsule (0.4 mg total) by mouth daily after supper.   traZODone 100 MG tablet Commonly known as: DESYREL Take 300 mg by mouth at bedtime.        Contact information for follow-up providers     Evon Slack, PA-C Follow up in 2 week(s).   Specialties: Orthopedic Surgery, Emergency Medicine Why: For staple removal Contact information: [address] [place] [address] [phone]         Andres Shad, MD. Schedule an appointment as soon as possible for a visit in 1 week(s).   Specialty: Internal Medicine Contact information: [address] [place] [address] [phone]              Contact information for after-discharge care     Destination     [facility] Preferred SNF .   Service: Skilled Nursing Contact information: [address] [place] [address] [phone]                    Discharge Exam: Ceasar Mons Weights   09/22/22 0500 09/23/22 0500  Weight: 78.2 kg 78.4 kg   General.  Frail elderly man, in no acute distress. Pulmonary.  Lungs clear bilaterally, normal respiratory effort. CV.  Regular rate and rhythm, no JVD, rub or murmur. Abdomen.  Soft, nontender, nondistended, BS positive. CNS.  Alert and oriented .  No focal neurologic deficit. Extremities.  No edema, no cyanosis, pulses intact and symmetrical. Psychiatry.  Appears to have some cognitive impairment  Condition at discharge: stable  The results of significant diagnostics from this hospitalization (including imaging, microbiology, ancillary and laboratory) are listed below for reference.    Imaging Studies: DG HIP UNILAT WITH PELVIS 2-3 VIEWS RIGHT  Result Date: 09/19/2022 CLINICAL DATA:  Elective surgery. EXAM: DG HIP (WITH OR WITHOUT PELVIS) 2-3V RIGHT COMPARISON:  Preoperative imaging FINDINGS: Six fluoroscopic spot views of the right hip obtained in the operating room. Intramedullary nail with distal locking and trans trochanteric screw fixation of proximal femur fracture. Fluoroscopy time 1 minutes 37 seconds. Dose 18.12 mGy. IMPRESSION: Intraoperative fluoroscopy during ORIF of proximal femur fracture. Electronically Signed   By: Narda Rutherford M.D.   On: 09/19/2022 17:13   DG C-Arm 1-60 Min-No Report  Result Date: 09/19/2022 Fluoroscopy was utilized by the requesting physician.  No radiographic interpretation.   DG Femur Min 2 Views Right  Result Date: 09/19/2022 CLINICAL DATA:  Femur x-rays requested by orthopedics. Intertrochanteric fracture.  Right hip pain after fall EXAM: RIGHT FEMUR 2 VIEWS COMPARISON:  None Available. FINDINGS: No fracture in the distal right femur. Right knee osteoarthritis. Unremarkable soft tissues. IMPRESSION: No acute fracture in the distal femur. Electronically Signed   By: Minerva Fester M.D.   On: 09/19/2022 01:47   DG Chest 1 View  Result Date: 09/19/2022 CLINICAL DATA:  Fall.  Right hip pain EXAM: CHEST  1 VIEW COMPARISON:  08/22/2022 FINDINGS: Stable cardiomediastinal silhouette. No focal consolidation, pleural effusion, or pneumothorax. No definite displaced rib fractures. IMPRESSION: No active disease. Electronically Signed   By: Minerva Fester M.D.   On: 09/19/2022 01:06   DG Hip Unilat With Pelvis 2-3 Views Right  Result Date: 09/19/2022 CLINICAL DATA:  Right hip pain after fall EXAM: DG HIP (WITH OR WITHOUT PELVIS) 2-3V RIGHT COMPARISON:  Radiographs 08/22/2022 FINDINGS: Mildly displaced right intertrochanteric fracture. No dislocation. Left THA. IMPRESSION: Mildly displaced right intertrochanteric fracture. Electronically Signed    By: Minerva Fester M.D.   On: 09/19/2022 01:04    Microbiology: Results for orders placed or performed during the hospital encounter of 09/19/22  MRSA Next Gen by PCR, Nasal     Status: None   Collection Time: 09/19/22  1:59 PM   Specimen: Nasal Mucosa; Nasal Swab  Result Value Ref Range Status   MRSA by PCR Next Gen NOT DETECTED NOT DETECTED Final    Comment: (NOTE) The GeneXpert MRSA Assay (FDA approved for NASAL specimens only), is one component of a comprehensive MRSA colonization surveillance program. It is not intended to diagnose MRSA infection nor to guide or monitor treatment for MRSA infections. Test performance is not FDA approved in patients less than 28 years old. Performed at [facility], [address] [place], [place] [address]     Labs: CBC: Recent Labs  Lab 09/19/22 0107 09/19/22 0440 09/20/22 0527 09/20/22 1051 09/21/22 0524 09/22/22 0531  WBC 8.8 11.3* 9.4  --  8.2 8.5  HGB 10.8* 10.7* 10.0* 10.5* 9.4* 9.4*  HCT 33.5* 32.9* 32.4* 34.6* 29.1* 29.9*  MCV 72.0* 71.5* 74.8*  --  71.3* 71.7*  PLT 369 360 301  --  300 331   Basic Metabolic Panel: Recent Labs  Lab 09/19/22 0107 09/19/22 0440 09/20/22 0527 09/21/22 0524 09/22/22 0531  NA 136 138 136 135 137  K 4.1 3.2* 4.1 3.7 3.9  CL 101 104 109 108 107  CO2 23 23 21* 20* 24  GLUCOSE 122* 138* 109* 157* 115*  BUN 10 10 10 9 9   CREATININE 0.88 0.75 0.80 0.81 0.81  CALCIUM 8.4* 8.3* 8.0* 7.7* 8.2*   Liver Function Tests: No results for input(s): "AST", "ALT", "ALKPHOS", "BILITOT", "PROT", "ALBUMIN" in the last 168 hours. CBG: No results for input(s): "GLUCAP" in the last 168 hours.  Discharge time spent: greater than 30 minutes.  This record has been created using Conservation officer, historic buildings. Errors have been sought and corrected,but may not always be located. Such creation errors do not reflect on the standard of care.   Signed: Arnetha Courser, MD [facility] 09/23/2022

## 2022-09-23 NOTE — Progress Notes (Signed)
Physical Therapy Treatment Patient Details Name: Adrian Gillespie MRN: [id number] DOB: 02-28-1950 Today's Date: 09/23/2022   History of Present Illness Adrian Gillespie is a 72 yo white male that presents for 2nd treatment session of day POD #2 for right intramedullary fixation of right hip for mechanical fall that resulted in right hip fracture. PMH includes COPD, GERD, dyslipedmia, schizophrenia, anxiety, and depression.    PT Comments  Pt received in bed after receiving Norco for pain. Slightly improved tolerance for mobility. Pt continues to require ModA for transfers as well as short side steps towards Left side with RW to transfer bed<>chair. Pt has a Hx of frequent falls with increased fear during mobility. Overall, very pleasant and cooperative, slightly delayed responses to questions, and continues to struggle with self voiding requiring frequent bladder scans. Pt positioned in chair to comfort, assisted with ordering lunch (pt wears glasses which he does not have). Pt will need assist to transfer back to bed going towards his Left side with RW.   If plan is discharge home, recommend the following: A lot of help with walking and/or transfers;A lot of help with bathing/dressing/bathroom   Can travel by private vehicle     No  Equipment Recommendations  None recommended by PT    Recommendations for Other Services       Precautions / Restrictions Precautions Precautions: None Restrictions Weight Bearing Restrictions: Yes RLE Weight Bearing: Weight bearing as tolerated     Mobility  Bed Mobility Overal bed mobility: Needs Assistance Bed Mobility: Supine to Sit     Supine to sit: HOB elevated, Mod assist     General bed mobility comments:  (Improved ability to transfer to EOB after being pre-medicated with Norco)    Transfers Overall transfer level: Needs assistance Equipment used: Rolling walker (2 wheels) Transfers: Sit to/from Stand Sit to Stand: Mod assist, From elevated  surface           General transfer comment: Several sit to stands at EOB to build confidence prior to side stepping towards bedside chair    Ambulation/Gait Ambulation/Gait assistance: Mod assist Gait Distance (Feet): 3 Feet Assistive device: Rolling walker (2 wheels) Gait Pattern/deviations: Step-to pattern, Decreased stance time - right, Antalgic Gait velocity: decreased     General Gait Details: Poor tolerance in wt shifting to R in standing. Pt able to take a few side steps to Right with heavy reliance of RW   Stairs             Wheelchair Mobility     Tilt Bed    Modified Rankin (Stroke Patients Only)       Balance Overall balance assessment: Needs assistance Sitting-balance support: Bilateral upper extremity supported, Feet supported Sitting balance-Leahy Scale: Good Sitting balance - Comments: Pt sat EOB x 10 minutes   Standing balance support: Bilateral upper extremity supported, During functional activity, Reliant on assistive device for balance Standing balance-Leahy Scale: Fair Standing balance comment: Fair Balance during "static" standing with RW                            Cognition Arousal: Alert Behavior During Therapy: WFL for tasks assessed/performed Overall Cognitive Status: History of cognitive impairments - at baseline                                 General Comments: Very fearfull of falling and increasing pain  level, delayed response time.        Exercises General Exercises - Lower Extremity Ankle Circles/Pumps: AROM, Both, 10 reps, Supine Long Arc Quad: AAROM, Right, 5 reps, Seated Heel Slides: AAROM, Right, 5 reps, Supine Hip ABduction/ADduction: AAROM, Right, 5 reps, Supine    General Comments General comments (skin integrity, edema, etc.):  (Pt educated on importance of increasing mobility each session with good understanding)      Pertinent Vitals/Pain Pain Assessment Pain Assessment:  0-10 Pain Score: 8  Pain Location: Right hip Pain Descriptors / Indicators: Aching, Grimacing Pain Intervention(s): Premedicated before session    Home Living                          Prior Function            PT Goals (current goals can now be found in the care plan section) Acute Rehab PT Goals Patient Stated Goal: To leave hospital and return to assisted living facility. Progress towards PT goals: Progressing toward goals    Frequency    BID      PT Plan      Co-evaluation              AM-PAC PT "6 Clicks" Mobility   Outcome Measure  Help needed turning from your back to your side while in a flat bed without using bedrails?: A Lot Help needed moving from lying on your back to sitting on the side of a flat bed without using bedrails?: Total Help needed moving to and from a bed to a chair (including a wheelchair)?: A Lot Help needed standing up from a chair using your arms (e.g., wheelchair or bedside chair)?: A Lot Help needed to walk in hospital room?: Total Help needed climbing 3-5 steps with a railing? : Total 6 Click Score: 9    End of Session Equipment Utilized During Treatment: Gait belt Activity Tolerance: Patient limited by pain Patient left: in chair;with call bell/phone within reach;with chair alarm set Nurse Communication: Mobility status PT Visit Diagnosis: Repeated falls (R29.6);Pain;Other abnormalities of gait and mobility (R26.89) Pain - Right/Left: Right Pain - part of body: Hip     Time: 1100-1139 PT Time Calculation (min) (ACUTE ONLY): 39 min  Charges:    $Gait Training: 8-22 mins $Therapeutic Exercise: 8-22 mins $Therapeutic Activity: 8-22 mins PT General Charges $$ ACUTE PT VISIT: 1 Visit                    Zadie Cleverly, PTA  Jannet Askew 09/23/2022, 12:31 PM

## 2022-09-23 NOTE — Progress Notes (Signed)
Physical Therapy Treatment Patient Details Name: Adrian Gillespie MRN: [id number] DOB: 1950/09/25 Today's Date: 09/23/2022   History of Present Illness Adrian Gillespie is a 72 yo white male that presents for 2nd treatment session of day POD #2 for right intramedullary fixation of right hip for mechanical fall that resulted in right hip fracture. PMH includes COPD, GERD, dyslipedmia, schizophrenia, anxiety, and depression.    PT Comments  Pt seen again this pm for continued transfer training and standing activity. Improved weight acceptance noted through R LE, however continues to express pain and will need to be pre-medicated prior to therapy at next level of care. Will continue PT acute services if d/c today is delayed.    If plan is discharge home, recommend the following: A lot of help with walking and/or transfers;A lot of help with bathing/dressing/bathroom   Can travel by private vehicle     No  Equipment Recommendations  None recommended by PT    Recommendations for Other Services       Precautions / Restrictions Precautions Precautions: None Restrictions Weight Bearing Restrictions: Yes RLE Weight Bearing: Weight bearing as tolerated     Mobility  Bed Mobility Overal bed mobility: Needs Assistance Bed Mobility: Supine to Sit     Supine to sit: HOB elevated, Mod assist Sit to supine: Mod assist   General bed mobility comments: MaxA to transfer and scoot to EOB due to pain    Transfers Overall transfer level: Needs assistance Equipment used: Rolling walker (2 wheels) Transfers: Sit to/from Stand Sit to Stand: Mod assist, From elevated surface           General transfer comment: Several sit to stands at EOB to build confidence prior to side stepping towards bedside chair    Ambulation/Gait Ambulation/Gait assistance: Mod assist Gait Distance (Feet): 2 Feet Assistive device: Rolling walker (2 wheels) Gait Pattern/deviations: Step-to pattern, Decreased stance  time - right, Antalgic Gait velocity: decreased     General Gait Details: Poor tolerance in wt shifting to R in standing. Pt able to take a few side steps to Right with heavy reliance of RW   Stairs             Wheelchair Mobility     Tilt Bed    Modified Rankin (Stroke Patients Only)       Balance Overall balance assessment: Needs assistance Sitting-balance support: Bilateral upper extremity supported, Feet supported Sitting balance-Leahy Scale: Good Sitting balance - Comments: Pt sat EOB x 10 minutes   Standing balance support: Bilateral upper extremity supported, During functional activity, Reliant on assistive device for balance Standing balance-Leahy Scale: Fair Standing balance comment: Fair Balance during "static" standing with RW                            Cognition Arousal: Alert Behavior During Therapy: WFL for tasks assessed/performed Overall Cognitive Status: History of cognitive impairments - at baseline                                 General Comments: Very fearfull of falling and increasing pain level, delayed response time.        Exercises General Exercises - Lower Extremity Ankle Circles/Pumps: AROM, Both, 10 reps, Supine Long Arc Quad: AAROM, Right, 5 reps, Seated Heel Slides: AAROM, Right, 5 reps, Supine Hip ABduction/ADduction: AAROM, Right, 5 reps, Supine    General Comments General  comments (skin integrity, edema, etc.):  (Pt educated on importance of increasing mobility each session with good understanding)      Pertinent Vitals/Pain Pain Assessment Pain Assessment: 0-10 Pain Score: 8  Pain Location: Right hip Pain Descriptors / Indicators: Aching, Grimacing Pain Intervention(s): Limited activity within patient's tolerance    Home Living                          Prior Function            PT Goals (current goals can now be found in the care plan section) Acute Rehab PT Goals Patient  Stated Goal: To leave hospital and return to assisted living facility. Progress towards PT goals: Progressing toward goals    Frequency    BID      PT Plan      Co-evaluation              AM-PAC PT "6 Clicks" Mobility   Outcome Measure  Help needed turning from your back to your side while in a flat bed without using bedrails?: A Lot Help needed moving from lying on your back to sitting on the side of a flat bed without using bedrails?: Total Help needed moving to and from a bed to a chair (including a wheelchair)?: A Lot Help needed standing up from a chair using your arms (e.g., wheelchair or bedside chair)?: A Lot Help needed to walk in hospital room?: Total Help needed climbing 3-5 steps with a railing? : Total 6 Click Score: 9    End of Session Equipment Utilized During Treatment: Gait belt Activity Tolerance: Patient limited by pain Patient left: with call bell/phone within reach;Other (comment) (On BSC, nursing notified) Nurse Communication: Mobility status (BSC use) PT Visit Diagnosis: Repeated falls (R29.6);Pain;Other abnormalities of gait and mobility (R26.89) Pain - Right/Left: Right Pain - part of body: Hip     Time: 1240-1309 PT Time Calculation (min) (ACUTE ONLY): 29 min  Charges:    $Gait Training: 8-22 mins $Therapeutic Exercise: 8-22 mins $Therapeutic Activity: 8-22 mins PT General Charges $$ ACUTE PT VISIT: 1 Visit                    Zadie Cleverly, PTA  Jannet Askew 09/23/2022, 3:33 PM

## 2023-01-13 ENCOUNTER — Emergency Department: Payer: Medicare Other

## 2023-01-13 ENCOUNTER — Other Ambulatory Visit: Payer: Self-pay

## 2023-01-13 ENCOUNTER — Inpatient Hospital Stay
Admission: EM | Admit: 2023-01-13 | Discharge: 2023-01-29 | DRG: 466 | Disposition: A | Discharge status: Skilled Nursing Facility | Payer: Medicare Other | Source: Skilled Nursing Facility | Attending: Internal Medicine | Admitting: Internal Medicine

## 2023-01-13 DIAGNOSIS — N3289 Other specified disorders of bladder: Secondary | ICD-10-CM | POA: Diagnosis present

## 2023-01-13 DIAGNOSIS — Z91013 Allergy to seafood: Secondary | ICD-10-CM

## 2023-01-13 DIAGNOSIS — Y831 Surgical operation with implant of artificial internal device as the cause of abnormal reaction of the patient, or of later complication, without mention of misadventure at the time of the procedure: Secondary | ICD-10-CM | POA: Diagnosis present

## 2023-01-13 DIAGNOSIS — T8454XA Infection and inflammatory reaction due to internal left knee prosthesis, initial encounter: Secondary | ICD-10-CM | POA: Diagnosis not present

## 2023-01-13 DIAGNOSIS — R296 Repeated falls: Secondary | ICD-10-CM | POA: Diagnosis present

## 2023-01-13 DIAGNOSIS — Y92009 Unspecified place in unspecified non-institutional (private) residence as the place of occurrence of the external cause: Secondary | ICD-10-CM

## 2023-01-13 DIAGNOSIS — B9620 Unspecified Escherichia coli [E. coli] as the cause of diseases classified elsewhere: Principal | ICD-10-CM

## 2023-01-13 DIAGNOSIS — K651 Peritoneal abscess: Secondary | ICD-10-CM | POA: Diagnosis present

## 2023-01-13 DIAGNOSIS — I959 Hypotension, unspecified: Secondary | ICD-10-CM | POA: Insufficient documentation

## 2023-01-13 DIAGNOSIS — F209 Schizophrenia, unspecified: Secondary | ICD-10-CM | POA: Diagnosis present

## 2023-01-13 DIAGNOSIS — E871 Hypo-osmolality and hyponatremia: Secondary | ICD-10-CM | POA: Diagnosis present

## 2023-01-13 DIAGNOSIS — J4489 Other specified chronic obstructive pulmonary disease: Secondary | ICD-10-CM | POA: Diagnosis present

## 2023-01-13 DIAGNOSIS — R6510 Systemic inflammatory response syndrome (SIRS) of non-infectious origin without acute organ dysfunction: Secondary | ICD-10-CM | POA: Diagnosis not present

## 2023-01-13 DIAGNOSIS — R7881 Bacteremia: Secondary | ICD-10-CM

## 2023-01-13 DIAGNOSIS — R32 Unspecified urinary incontinence: Secondary | ICD-10-CM | POA: Diagnosis not present

## 2023-01-13 DIAGNOSIS — K219 Gastro-esophageal reflux disease without esophagitis: Secondary | ICD-10-CM | POA: Diagnosis present

## 2023-01-13 DIAGNOSIS — J449 Chronic obstructive pulmonary disease, unspecified: Secondary | ICD-10-CM | POA: Diagnosis not present

## 2023-01-13 DIAGNOSIS — F32A Depression, unspecified: Secondary | ICD-10-CM | POA: Diagnosis present

## 2023-01-13 DIAGNOSIS — R4182 Altered mental status, unspecified: Secondary | ICD-10-CM | POA: Diagnosis not present

## 2023-01-13 DIAGNOSIS — R9431 Abnormal electrocardiogram [ECG] [EKG]: Secondary | ICD-10-CM | POA: Diagnosis present

## 2023-01-13 DIAGNOSIS — I10 Essential (primary) hypertension: Secondary | ICD-10-CM | POA: Diagnosis present

## 2023-01-13 DIAGNOSIS — E876 Hypokalemia: Secondary | ICD-10-CM | POA: Diagnosis present

## 2023-01-13 DIAGNOSIS — G9341 Metabolic encephalopathy: Secondary | ICD-10-CM | POA: Diagnosis present

## 2023-01-13 DIAGNOSIS — R569 Unspecified convulsions: Secondary | ICD-10-CM | POA: Diagnosis not present

## 2023-01-13 DIAGNOSIS — M25512 Pain in left shoulder: Secondary | ICD-10-CM | POA: Diagnosis present

## 2023-01-13 DIAGNOSIS — D649 Anemia, unspecified: Secondary | ICD-10-CM | POA: Diagnosis present

## 2023-01-13 DIAGNOSIS — Z1152 Encounter for screening for COVID-19: Secondary | ICD-10-CM | POA: Diagnosis not present

## 2023-01-13 DIAGNOSIS — G8929 Other chronic pain: Secondary | ICD-10-CM | POA: Diagnosis present

## 2023-01-13 DIAGNOSIS — I2510 Atherosclerotic heart disease of native coronary artery without angina pectoris: Secondary | ICD-10-CM | POA: Diagnosis present

## 2023-01-13 DIAGNOSIS — W19XXXA Unspecified fall, initial encounter: Secondary | ICD-10-CM

## 2023-01-13 DIAGNOSIS — F17210 Nicotine dependence, cigarettes, uncomplicated: Secondary | ICD-10-CM | POA: Diagnosis present

## 2023-01-13 DIAGNOSIS — B181 Chronic viral hepatitis B without delta-agent: Secondary | ICD-10-CM | POA: Diagnosis present

## 2023-01-13 DIAGNOSIS — L02416 Cutaneous abscess of left lower limb: Secondary | ICD-10-CM

## 2023-01-13 DIAGNOSIS — E785 Hyperlipidemia, unspecified: Secondary | ICD-10-CM | POA: Diagnosis present

## 2023-01-13 DIAGNOSIS — Z7901 Long term (current) use of anticoagulants: Secondary | ICD-10-CM | POA: Diagnosis not present

## 2023-01-13 DIAGNOSIS — E878 Other disorders of electrolyte and fluid balance, not elsewhere classified: Secondary | ICD-10-CM | POA: Diagnosis present

## 2023-01-13 DIAGNOSIS — I480 Paroxysmal atrial fibrillation: Secondary | ICD-10-CM | POA: Diagnosis present

## 2023-01-13 DIAGNOSIS — F419 Anxiety disorder, unspecified: Secondary | ICD-10-CM | POA: Diagnosis present

## 2023-01-13 DIAGNOSIS — A419 Sepsis, unspecified organism: Principal | ICD-10-CM

## 2023-01-13 DIAGNOSIS — T8452XA Infection and inflammatory reaction due to internal left hip prosthesis, initial encounter: Secondary | ICD-10-CM | POA: Diagnosis present

## 2023-01-13 DIAGNOSIS — T8450XA Infection and inflammatory reaction due to unspecified internal joint prosthesis, initial encounter: Secondary | ICD-10-CM

## 2023-01-13 DIAGNOSIS — Z79899 Other long term (current) drug therapy: Secondary | ICD-10-CM

## 2023-01-13 DIAGNOSIS — M4646 Discitis, unspecified, lumbar region: Secondary | ICD-10-CM | POA: Diagnosis present

## 2023-01-13 DIAGNOSIS — A4151 Sepsis due to Escherichia coli [E. coli]: Secondary | ICD-10-CM | POA: Diagnosis present

## 2023-01-13 DIAGNOSIS — M25452 Effusion, left hip: Secondary | ICD-10-CM | POA: Diagnosis present

## 2023-01-13 DIAGNOSIS — Z7951 Long term (current) use of inhaled steroids: Secondary | ICD-10-CM

## 2023-01-13 LAB — RESP PANEL BY RT-PCR (RSV, FLU A&B, COVID)  RVPGX2
Influenza A by PCR: NEGATIVE
Influenza B by PCR: NEGATIVE
Resp Syncytial Virus by PCR: NEGATIVE
SARS Coronavirus 2 by RT PCR: NEGATIVE

## 2023-01-13 LAB — APTT: aPTT: 38 s — ABNORMAL HIGH (ref 24–36)

## 2023-01-13 LAB — CBC WITH DIFFERENTIAL/PLATELET
Abs Immature Granulocytes: 0.13 10*3/uL — ABNORMAL HIGH (ref 0.00–0.07)
Basophils Absolute: 0 10*3/uL (ref 0.0–0.1)
Basophils Relative: 0 %
Eosinophils Absolute: 0 10*3/uL (ref 0.0–0.5)
Eosinophils Relative: 0 %
HCT: 30.4 % — ABNORMAL LOW (ref 39.0–52.0)
Hemoglobin: 10.2 g/dL — ABNORMAL LOW (ref 13.0–17.0)
Immature Granulocytes: 1 %
Lymphocytes Relative: 3 %
Lymphs Abs: 0.4 10*3/uL — ABNORMAL LOW (ref 0.7–4.0)
MCH: 24.6 pg — ABNORMAL LOW (ref 26.0–34.0)
MCHC: 33.6 g/dL (ref 30.0–36.0)
MCV: 73.4 fL — ABNORMAL LOW (ref 80.0–100.0)
Monocytes Absolute: 0.5 10*3/uL (ref 0.1–1.0)
Monocytes Relative: 4 %
Neutro Abs: 11.6 10*3/uL — ABNORMAL HIGH (ref 1.7–7.7)
Neutrophils Relative %: 92 %
Platelets: 284 10*3/uL (ref 150–400)
RBC: 4.14 MIL/uL — ABNORMAL LOW (ref 4.22–5.81)
RDW: 18 % — ABNORMAL HIGH (ref 11.5–15.5)
WBC: 12.7 10*3/uL — ABNORMAL HIGH (ref 4.0–10.5)
nRBC: 0 % (ref 0.0–0.2)

## 2023-01-13 LAB — URINALYSIS, W/ REFLEX TO CULTURE (INFECTION SUSPECTED)
Bacteria, UA: NONE SEEN
Bilirubin Urine: NEGATIVE
Glucose, UA: NEGATIVE mg/dL
Ketones, ur: NEGATIVE mg/dL
Leukocytes,Ua: NEGATIVE
Nitrite: NEGATIVE
Protein, ur: NEGATIVE mg/dL
Specific Gravity, Urine: 1.01 (ref 1.005–1.030)
Squamous Epithelial / HPF: 0 /[HPF] (ref 0–5)
pH: 6 (ref 5.0–8.0)

## 2023-01-13 LAB — COMPREHENSIVE METABOLIC PANEL
ALT: 18 U/L (ref 0–44)
AST: 22 U/L (ref 15–41)
Albumin: 2.4 g/dL — ABNORMAL LOW (ref 3.5–5.0)
Alkaline Phosphatase: 87 U/L (ref 38–126)
Anion gap: 9 (ref 5–15)
BUN: 11 mg/dL (ref 8–23)
CO2: 20 mmol/L — ABNORMAL LOW (ref 22–32)
Calcium: 7.8 mg/dL — ABNORMAL LOW (ref 8.9–10.3)
Chloride: 98 mmol/L (ref 98–111)
Creatinine, Ser: 0.9 mg/dL (ref 0.61–1.24)
GFR, Estimated: >60 mL/min (ref >60)
Glucose, Bld: 126 mg/dL — ABNORMAL HIGH (ref 70–99)
Potassium: 3.4 mmol/L — ABNORMAL LOW (ref 3.5–5.1)
Sodium: 127 mmol/L — ABNORMAL LOW (ref 135–145)
Total Bilirubin: 1.4 mg/dL — ABNORMAL HIGH (ref <1.2)
Total Protein: 6.3 g/dL — ABNORMAL LOW (ref 6.5–8.1)

## 2023-01-13 LAB — LACTIC ACID, PLASMA
Lactic Acid, Venous: 1.6 mmol/L (ref 0.5–1.9)
Lactic Acid, Venous: 1.3 mmol/L (ref 0.5–1.9)

## 2023-01-13 LAB — BLOOD GAS, VENOUS
Acid-base deficit: 0.2 mmol/L (ref 0.0–2.0)
Bicarbonate: 21.8 mmol/L (ref 20.0–28.0)
O2 Saturation: 92.1 %
Patient temperature: 37
pCO2, Ven: 28 mm[Hg] — ABNORMAL LOW (ref 44–60)
pH, Ven: 7.5 — ABNORMAL HIGH (ref 7.25–7.43)
pO2, Ven: 57 mm[Hg] — ABNORMAL HIGH (ref 32–45)

## 2023-01-13 LAB — PROTIME-INR
INR: 1.3 — ABNORMAL HIGH (ref 0.8–1.2)
Prothrombin Time: 15.9 s — ABNORMAL HIGH (ref 11.4–15.2)

## 2023-01-13 LAB — PROCALCITONIN: Procalcitonin: 0.46 ng/mL

## 2023-01-13 LAB — TROPONIN I (HIGH SENSITIVITY): Troponin I (High Sensitivity): 12 ng/L (ref <18)

## 2023-01-13 MED ORDER — NICOTINE 21 MG/24HR TD PT24
21.0000 mg | MEDICATED_PATCH | Freq: Every day | TRANSDERMAL | Status: DC
  Administered 2023-01-14 – 2023-01-22 (×9): 21 mg via TRANSDERMAL
  Filled 2023-01-13 (×10): qty 1

## 2023-01-13 MED ORDER — VANCOMYCIN HCL IN DEXTROSE 1-5 GM/200ML-% IV SOLN
1000.0000 mg | Freq: Once | INTRAVENOUS | Status: DC
  Filled 2023-01-13: qty 200

## 2023-01-13 MED ORDER — DIPHENHYDRAMINE HCL 50 MG/ML IJ SOLN: 12.5000 mg | Freq: Three times a day (TID) | INTRAMUSCULAR | Status: DC | PRN

## 2023-01-13 MED ORDER — LACTATED RINGERS IV BOLUS
1000.0000 mL | Freq: Once | INTRAVENOUS | Status: AC
  Administered 2023-01-13: 1000 mL via INTRAVENOUS

## 2023-01-13 MED ORDER — METRONIDAZOLE 500 MG/100ML IV SOLN
500.0000 mg | Freq: Once | INTRAVENOUS | Status: AC
  Administered 2023-01-13: 500 mg via INTRAVENOUS
  Filled 2023-01-13: qty 100

## 2023-01-13 MED ORDER — VANCOMYCIN HCL IN DEXTROSE 1-5 GM/200ML-% IV SOLN
1000.0000 mg | Freq: Once | INTRAVENOUS | Status: AC
  Administered 2023-01-13: 1000 mg via INTRAVENOUS

## 2023-01-13 MED ORDER — SODIUM CHLORIDE 0.9 % IV SOLN
2.0000 g | Freq: Once | INTRAVENOUS | Status: AC
  Administered 2023-01-13: 2 g via INTRAVENOUS
  Filled 2023-01-13: qty 12.5

## 2023-01-13 MED ORDER — ACETAMINOPHEN 325 MG PO TABS
650.0000 mg | ORAL_TABLET | Freq: Four times a day (QID) | ORAL | Status: DC | PRN
  Administered 2023-01-20 – 2023-01-21 (×2): 650 mg via ORAL
  Filled 2023-01-13 (×2): qty 2

## 2023-01-13 MED ORDER — VANCOMYCIN HCL 750 MG/150ML IV SOLN
750.0000 mg | Freq: Once | INTRAVENOUS | Status: AC
  Administered 2023-01-14: 750 mg via INTRAVENOUS
  Filled 2023-01-13: qty 150

## 2023-01-13 MED ORDER — HYDRALAZINE HCL 20 MG/ML IJ SOLN: 5.0000 mg | INTRAMUSCULAR | Status: DC | PRN

## 2023-01-13 MED ORDER — VANCOMYCIN HCL 1750 MG/350ML IV SOLN
1750.0000 mg | Freq: Once | INTRAVENOUS | Status: DC
  Filled 2023-01-13: qty 350

## 2023-01-13 NOTE — Consult Note (Signed)
ED Pharmacy Antibiotic Sign Off An antibiotic consult was received from an ED provider for cefepime and vancomycin per pharmacy dosing for sepsis. A chart review was completed to assess appropriateness.   The following one time order(s) were placed:  --Cefepime 2 g IV --Vancomycin 1 g IV  Further antibiotic and/or antibiotic pharmacy consults should be ordered by the admitting provider if indicated.   Thank you for allowing pharmacy to be a part of this patient's care.   Tressie Ellis, [facility]  01/13/23 9:47 PM

## 2023-01-13 NOTE — Progress Notes (Signed)
CODE SEPSIS - PHARMACY COMMUNICATION  **Broad Spectrum Antibiotics should be administered within 1 hour of Sepsis diagnosis**  Time Code Sepsis Called/Page Received: 2123  Antibiotics Ordered: Cefepime, vancomycin, metronidazole  Time of 1st antibiotic administration: 2237 (cefepime), metronidazole given at 2205?  Additional action taken by pharmacy: N/A  Tressie Ellis 01/13/2023  10:38 PM

## 2023-01-13 NOTE — ED Notes (Signed)
Patient brought in by medic due to a fall. Medic states that patient fell x2 in 1hr. Patient disoriented when medic arrived. Heart rate 132 st, Temp 103.2 - unknown source. IV Tylenol given en route. Patient disoriented to place, situation.

## 2023-01-13 NOTE — ED Provider Notes (Addendum)
[facility] Provider Note    Event Date/Time   First MD Initiated Contact with Patient 01/13/23 2121     (approximate)   History   Chief Complaint Altered Mental Status   HPI  Adrian Gillespie is a 72 y.o. male with past medical history of hypertension, atrial fibrillation on Eliquis, COPD, GERD, and schizophrenia who presents to the ED for altered mental status.  Per EMS, patient had 2 separate falls at his nursing facility this evening and has been more confused than usual.  It is unclear whether he hit his head or lost consciousness, but when EMS arrived he was found to be febrile and tachycardic.  On arrival to the ED, he is somnolent but arousable to voice.  He currently denies any complaints, only states that he is not feeling well.     Physical Exam   Triage Vital Signs: ED Triage Vitals  Encounter Vitals Group     BP      Systolic BP Percentile      Diastolic BP Percentile      Pulse      Resp      Temp      Temp src      SpO2      Weight      Height      Head Circumference      Peak Flow      Pain Score      Pain Loc      Pain Education      Exclude from Growth Chart     Most recent vital signs: Vitals:   01/13/23 2124 01/13/23 2200  BP: 100/79 (!) 102/55  Pulse: (!) 127 (!) 124  Resp: 20 19  Temp:    SpO2: 94% 97%    Constitutional: Somnolent but arousable to voice, oriented to person but not place or time. Eyes: Conjunctivae are normal. Head: Atraumatic. Nose: No congestion/rhinnorhea. Mouth/Throat: Mucous membranes are moist.  Neck: Supple with no meningismus. Cardiovascular: Tachycardic, regular rhythm. Grossly normal heart sounds.  2+ radial pulses bilaterally. Respiratory: Normal respiratory effort.  No retractions. Lungs CTAB. Gastrointestinal: Soft and nontender. No distention. Musculoskeletal: No lower extremity tenderness nor edema.  Neurologic:  Normal speech and language. No gross focal neurologic deficits are  appreciated.    ED Results / Procedures / Treatments   Labs (all labs ordered are listed, but only abnormal results are displayed) Labs Reviewed  COMPREHENSIVE METABOLIC PANEL - Abnormal; Notable for the following components:      Result Value   Sodium 127 (*)    Potassium 3.4 (*)    CO2 20 (*)    Glucose, Bld 126 (*)    Calcium 7.8 (*)    Total Protein 6.3 (*)    Albumin 2.4 (*)    Total Bilirubin 1.4 (*)    All other components within normal limits  CBC WITH DIFFERENTIAL/PLATELET - Abnormal; Notable for the following components:   WBC 12.7 (*)    RBC 4.14 (*)    Hemoglobin 10.2 (*)    HCT 30.4 (*)    MCV 73.4 (*)    MCH 24.6 (*)    RDW 18.0 (*)    Neutro Abs 11.6 (*)    Lymphs Abs 0.4 (*)    Abs Immature Granulocytes 0.13 (*)    All other components within normal limits  PROTIME-INR - Abnormal; Notable for the following components:   Prothrombin Time 15.9 (*)    INR 1.3 (*)  All other components within normal limits  APTT - Abnormal; Notable for the following components:   aPTT 38 (*)    All other components within normal limits  BLOOD GAS, VENOUS - Abnormal; Notable for the following components:   pH, Ven 7.5 (*)    pCO2, Ven 28 (*)    pO2, Ven 57 (*)    All other components within normal limits  RESP PANEL BY RT-PCR (RSV, FLU A&B, COVID)  RVPGX2  CULTURE, BLOOD (ROUTINE X 2)  CULTURE, BLOOD (ROUTINE X 2)  LACTIC ACID, PLASMA  PROCALCITONIN  LACTIC ACID, PLASMA  URINALYSIS, W/ REFLEX TO CULTURE (INFECTION SUSPECTED)  MAGNESIUM  TROPONIN I (HIGH SENSITIVITY)  TROPONIN I (HIGH SENSITIVITY)     EKG  ED ECG REPORT I, Chesley Noon, the attending physician, personally viewed and interpreted this ECG.   Date: 01/13/2023  EKG Time: 21:21  Rate: 127  Rhythm: sinus tachycardia  Axis: Normal  Intervals: Prolonged QT  ST&T Change: None  RADIOLOGY CT head reviewed and interpreted by me with no hemorrhage or midline shift.  PROCEDURES:  Critical  Care performed: Yes, see critical care procedure note(s)  .Critical Care  Performed by: Chesley Noon, MD Authorized by: Chesley Noon, MD   Critical care provider statement:    Critical care time (minutes):  30   Critical care time was exclusive of:  Separately billable procedures and treating other patients and teaching time   Critical care was necessary to treat or prevent imminent or life-threatening deterioration of the following conditions:  Sepsis   Critical care was time spent personally by me on the following activities:  Development of treatment plan with patient or surrogate, discussions with consultants, evaluation of patient's response to treatment, examination of patient, ordering and review of laboratory studies, ordering and review of radiographic studies, ordering and performing treatments and interventions, pulse oximetry, re-evaluation of patient's condition and review of old charts   I assumed direction of critical care for this patient from another provider in my specialty: no     Care discussed with: admitting provider      MEDICATIONS ORDERED IN ED: Medications  vancomycin (VANCOCIN) IVPB 1000 mg/200 mL premix (has no administration in time range)    Followed by  vancomycin (VANCOREADY) IVPB 750 mg/150 mL (has no administration in time range)  ceFEPIme (MAXIPIME) 2 g in sodium chloride 0.9 % 100 mL IVPB (2 g Intravenous New Bag/Given 01/13/23 2237)  metroNIDAZOLE (FLAGYL) IVPB 500 mg (500 mg Intravenous New Bag/Given 01/13/23 2205)  lactated ringers bolus 1,000 mL (1,000 mLs Intravenous Bolus 01/13/23 2127)     IMPRESSION / MDM / ASSESSMENT AND PLAN / ED COURSE  I reviewed the triage vital signs and the nursing notes.                              72 y.o. male with past medical history of hypertension, atrial fibrillation on Eliquis, COPD, GERD, and schizophrenia who presents to the ED following multiple falls at his nursing facility, found to be febrile with  EMS.  Patient's presentation is most consistent with acute presentation with potential threat to life or bodily function.  Differential diagnosis includes, but is not limited to, sepsis, pneumonia, UTI, meningitis, intra-abdominal infection, anemia, electrolyte abnormality, AKI, intracranial injury, cervical spine injury.  Patient ill-appearing but in no acute distress, vital signs remarkable for fever, tachycardia, and hypoxia.  Patient with room air saturations of 86%, placed on  2 L nasal cannula with improvement.  EKG shows sinus tachycardia with no ischemic changes.  Given concern for sepsis, patient started on broad-spectrum antibiotics and resuscitated with IV fluids.  CT head and cervical spine performed given his multiple falls but are negative for acute process, no evidence of traumatic injury to his trunk or extremities.  Labs show leukocytosis with stable chronic anemia, no significant electrolyte abnormality or AKI noted, LFTs are unremarkable.  Troponin and lactic acid are within normal limits.  Chest x-ray with no signs of infection and he has a benign abdominal exam, no signs of meningitis.  Urinalysis pending at this time, suspect this is the source of his sepsis. On reassessment, patient appears much more alert, again denies any complaints. Case discussed with hospitalist for admission.      FINAL CLINICAL IMPRESSION(S) / ED DIAGNOSES   Final diagnoses:  Sepsis without acute organ dysfunction, due to unspecified organism [facility])     Rx / DC Orders   ED Discharge Orders     None        Note:  This document was prepared using Dragon voice recognition software and may include unintentional dictation errors.   Chesley Noon, MD 01/13/23 [id number]    Chesley Noon, MD 01/13/23 [phone]

## 2023-01-13 NOTE — H&P (Signed)
History and Physical    Adrian Gillespie ZOX:[id number] DOB: 1950-09-01 DOA: 01/13/2023  Referring MD/NP/PA:   PCP: Andres Shad, MD   Patient coming from:  The patient is coming from home.     Chief Complaint:   HPI: Adrian Gillespie is a 72 y.o. male with medical history significant of      Data reviewed independently and ED Course: pt was found to have     ***       EKG: I have personally reviewed.  Not done in ED, will get one.   ***   Review of Systems:   General: no fevers, chills, no body weight gain, has poor appetite, has fatigue HEENT: no blurry vision, hearing changes or sore throat Respiratory: no dyspnea, coughing, wheezing CV: no chest pain, no palpitations GI: no nausea, vomiting, abdominal pain, diarrhea, constipation GU: no dysuria, burning on urination, increased urinary frequency, hematuria  Ext: no leg edema Neuro: no unilateral weakness, numbness, or tingling, no vision change or hearing loss Skin: no rash, no skin tear. MSK: No muscle spasm, no deformity, no limitation of range of movement in spin Heme: No easy bruising.  Travel history: No recent long distant travel.   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 He has never used smokeless tobacco. He reports that he does not currently use alcohol. He  reports that he does not use drugs.  Family History:  Family History  Problem Relation Age of Onset   Arthritis/Rheumatoid Mother      Prior to Admission medications   Medication Sig Start Date End Date Taking? Authorizing Provider  acetaminophen (TYLENOL) 325 MG tablet Take 2 tablets (650 mg total) by mouth every 6 (six) hours as needed for mild pain or moderate pain. 02/07/20   Alford Highland, MD  ADVAIR HFA [phone] MCG/ACT inhaler Inhale 2 puffs into the lungs 2 (two) times daily. 01/10/20   [provider]  albuterol (VENTOLIN HFA) 108 (90 Base) MCG/ACT inhaler Inhale 2 puffs into the lungs every 6 (six) hours as needed for shortness of breath or wheezing. 04/05/19   Dhungel, Nishant, MD  benztropine (COGENTIN) 1 MG tablet Take 1 mg by mouth daily.    [provider]  BEVESPI AEROSPHERE 9-4.8 MCG/ACT AERO Inhale 2 puffs into the lungs at bedtime.  03/31/19   [provider]  enoxaparin (LOVENOX) 40 MG/0.4ML injection Inject 0.4 mLs (40 mg total) into the skin daily for 14 days. 09/24/22 10/08/22  Arnetha Courser, MD  Fe Fum-Vit C-Vit B12-FA (TRIGELS-F FORTE) CAPS capsule Take 1 capsule by mouth 2 (two) times daily. 09/23/22   Arnetha Courser, MD  feeding supplement, ENSURE ENLIVE, (ENSURE ENLIVE) LIQD Take 237 mLs by mouth 2 (two) times daily between meals. 05/26/19   Dhungel, Nishant, MD  haloperidol (HALDOL) 10 MG tablet Take 10  mg by mouth 2 (two) times daily.    [provider]  HYDROcodone-acetaminophen (NORCO/VICODIN) 5-325 MG tablet Take 1-2 tablets by mouth every 4 (four) hours as needed for moderate pain (pain score 4-6). 09/20/22   Dedra Skeens, PA-C  hydrOXYzine (VISTARIL) 50 MG capsule Take 50 mg by mouth 2 (two) times daily as needed for anxiety.  04/14/19   [provider]  INVEGA SUSTENNA 234 MG/1.5ML SUSY injection Inject 234 mg into the muscle every 30 (thirty) days. 04/18/19   [provider]  magnesium hydroxide (MILK OF MAGNESIA) 400  MG/5ML suspension Take 30 mLs by mouth daily as needed for mild constipation. 09/23/22   Arnetha Courser, MD  montelukast (SINGULAIR) 10 MG tablet Take 10 mg by mouth daily. 11/25/19   [provider]  Multiple Vitamin (MULTIVITAMIN WITH MINERALS) TABS tablet Take 1 tablet by mouth daily. 09/24/22   Arnetha Courser, MD  naloxone [facility]) nasal spray 4 mg/0.1 mL One spray in nostril for opioid reversal if unresponsive 02/07/20   Alford Highland, MD  nicotine (NICODERM CQ - DOSED IN MG/24 HOURS) 21 mg/24hr patch Place 1 patch (21 mg total) onto the skin daily. 05/26/19   Dhungel, Theda Belfast, MD  omeprazole (PRILOSEC) 20 MG capsule Take 20 mg by mouth daily.    [provider]  polyethylene glycol (MIRALAX / GLYCOLAX) 17 g packet Take 17 g by mouth daily. 02/07/20   Alford Highland, MD  tamsulosin (FLOMAX) 0.4 MG CAPS capsule Take 1 capsule (0.4 mg total) by mouth daily after supper. 09/23/22   Arnetha Courser, MD  tiotropium (SPIRIVA HANDIHALER) 18 MCG inhalation capsule Place 1 capsule (18 mcg total) into inhaler and inhale daily. 04/05/19   Dhungel, Theda Belfast, MD  traZODone (DESYREL) 100 MG tablet Take 300 mg by mouth at bedtime.    [provider]    Physical Exam: Vitals:   01/13/23 2124 01/13/23 2200 01/13/23 2300 01/13/23 2315  BP: 100/79 (!) 102/55    Pulse: (!) 127 (!) 124  (!) 113  Resp: 20 19  (!) 22  Temp:   99.9 F (37.7 C)   TempSrc:   Oral   SpO2: 94% 97%  96%  Weight:  78.9 kg     General: Not in acute distress HEENT:       Eyes: PERRL, EOMI, no jaundice       ENT: No discharge from the ears and nose, no pharynx injection, no tonsillar enlargement.        Neck: No JVD, no bruit, no mass felt. Heme: No neck lymph node enlargement. Cardiac: S1/S2, RRR, No murmurs, No gallops or rubs. Respiratory: No rales, wheezing, rhonchi or rubs. GI: Soft, nondistended, nontender, no rebound pain, no organomegaly, BS present. GU: No hematuria Ext: No pitting leg edema  bilaterally. 1+DP/PT pulse bilaterally. Musculoskeletal: No joint deformities, No joint redness or warmth, no limitation of ROM in spin. Skin: No rashes.  Neuro: Alert, oriented X3, cranial nerves II-XII grossly intact, moves all extremities normally. Muscle strength 5/5 in all extremities, sensation to light touch intact. Brachial reflex 2+ bilaterally. Knee reflex 1+ bilaterally. Negative Babinski's sign. Normal finger to nose test. Psych: Patient is not psychotic, no suicidal or hemocidal ideation.  Labs on Admission: I have personally reviewed following labs and imaging studies  CBC: Recent Labs  Lab 01/13/23 2129  WBC 12.7*  NEUTROABS 11.6*  HGB 10.2*  HCT 30.4*  MCV 73.4*  PLT 284   Basic Metabolic Panel: Recent Labs  Lab 01/13/23 2129  NA 127*  K 3.4*  CL 98  CO2 20*  GLUCOSE 126*  BUN 11  CREATININE 0.90  CALCIUM 7.8*   GFR: Estimated Creatinine Clearance: 75.3 mL/min (by C-G formula based on SCr of 0.9 mg/dL). Liver Function Tests: Recent Labs  Lab 01/13/23 2129  AST 22  ALT 18  ALKPHOS 87  BILITOT 1.4*  PROT 6.3*  ALBUMIN 2.4*   No results for input(s): "LIPASE", "AMYLASE" in the last 168 hours. No results for input(s): "AMMONIA" in the last 168 hours. Coagulation Profile: Recent Labs  Lab 01/13/23 2129  INR 1.3*   Cardiac Enzymes: No results for input(s): "CKTOTAL", "CKMB", "CKMBINDEX", "TROPONINI" in the last 168 hours. BNP (last 3 results) No results for input(s): "PROBNP" in the last 8760 hours. HbA1C: No results for input(s): "HGBA1C" in the last 72 hours. CBG: No results for input(s): "GLUCAP" in the last 168 hours. Lipid Profile: No results for input(s): "CHOL", "HDL", "LDLCALC", "TRIG", "CHOLHDL", "LDLDIRECT" in the last 72 hours. Thyroid Function Tests: No results for input(s): "TSH", "T4TOTAL", "FREET4", "T3FREE", "THYROIDAB" in the last 72 hours. Anemia Panel: No results for input(s): "VITAMINB12", "FOLATE", "FERRITIN", "TIBC",  "IRON", "RETICCTPCT" in the last 72 hours. Urine analysis:    Component Value Date/Time   COLORURINE YELLOW (A) 05/23/2019 2349   APPEARANCEUR CLEAR (A) 05/23/2019 2349   LABSPEC 1.010 05/23/2019 2349   PHURINE 6.0 05/23/2019 2349   GLUCOSEU NEGATIVE 05/23/2019 2349   HGBUR NEGATIVE 05/23/2019 2349   BILIRUBINUR NEGATIVE 05/23/2019 2349   KETONESUR NEGATIVE 05/23/2019 2349   PROTEINUR NEGATIVE 05/23/2019 2349   NITRITE NEGATIVE 05/23/2019 2349   LEUKOCYTESUR NEGATIVE 05/23/2019 2349   Sepsis Labs: @LABRCNTIP (procalcitonin:4,lacticidven:4) ) Recent Results (from the past 240 hours)  Resp panel by RT-PCR (RSV, Flu A&B, Covid) Anterior Nasal Swab     Status: None   Collection Time: 01/13/23  9:29 PM   Specimen: Anterior Nasal Swab  Result Value Ref Range Status   SARS Coronavirus 2 by RT PCR NEGATIVE NEGATIVE Final    Comment: (NOTE) SARS-CoV-2 target nucleic acids are NOT DETECTED.  The SARS-CoV-2 RNA is generally detectable in upper respiratory specimens during the acute phase of infection. The lowest concentration of SARS-CoV-2 viral copies this assay can detect is 138 copies/mL. A negative result does not preclude SARS-Cov-2 infection and should not be used as the sole basis for treatment or other patient management decisions. A negative result may occur with  improper specimen collection/handling, submission of specimen other than nasopharyngeal swab, presence of viral mutation(s) within the areas targeted by this assay, and inadequate number of viral copies(<138 copies/mL). A negative result must be combined with clinical observations, patient history, and epidemiological information. The expected result is Negative.  Fact Sheet for Patients:  BloggerCourse.com  Fact Sheet for Healthcare Providers:  SeriousBroker.it  This test is no t yet approved or cleared by the [place] FDA and  has been authorized for  detection and/or diagnosis of SARS-CoV-2 by FDA under an Emergency Use Authorization (EUA). This EUA will remain  in effect (meaning this test can be used) for the duration of the COVID-19 declaration under Section 564(b)(1) of the Act, 21 U.S.C.section 360bbb-3(b)(1), unless the authorization is terminated  or revoked sooner.       Influenza A by PCR NEGATIVE NEGATIVE Final   Influenza B by PCR NEGATIVE NEGATIVE Final    Comment: (NOTE) The Xpert Xpress SARS-CoV-2/FLU/RSV plus assay is intended as an aid in the diagnosis of influenza from Nasopharyngeal swab specimens  and should not be used as a sole basis for treatment. Nasal washings and aspirates are unacceptable for Xpert Xpress SARS-CoV-2/FLU/RSV testing.  Fact Sheet for Patients: BloggerCourse.com  Fact Sheet for Healthcare Providers: SeriousBroker.it  This test is not yet approved or cleared by the [place] FDA and has been authorized for detection and/or diagnosis of SARS-CoV-2 by FDA under an Emergency Use Authorization (EUA). This EUA will remain in effect (meaning this test can be used) for the duration of the COVID-19 declaration under Section 564(b)(1) of the Act, 21 U.S.C. section 360bbb-3(b)(1), unless the authorization is terminated or revoked.     Resp Syncytial Virus by PCR NEGATIVE NEGATIVE Final    Comment: (NOTE) Fact Sheet for Patients: BloggerCourse.com  Fact Sheet for Healthcare Providers: SeriousBroker.it  This test is not yet approved or cleared by the [place] FDA and has been authorized for detection and/or diagnosis of SARS-CoV-2 by FDA under an Emergency Use Authorization (EUA). This EUA will remain in effect (meaning this test can be used) for the duration of the COVID-19 declaration under Section 564(b)(1) of the Act, 21 U.S.C. section 360bbb-3(b)(1), unless the authorization is  terminated or revoked.  Performed at [facility], [address] [place], [place] [address]      Radiological Exams on Admission: CT Cervical Spine Wo Contrast Result Date: 01/13/2023 CLINICAL DATA:  Trauma fall EXAM: CT CERVICAL SPINE WITHOUT CONTRAST TECHNIQUE: Multidetector CT imaging of the cervical spine was performed without intravenous contrast. Multiplanar CT image reconstructions were also generated. RADIATION DOSE REDUCTION: This exam was performed according to the departmental dose-optimization program which includes automated exposure control, adjustment of the mA and/or kV according to patient size and/or use of iterative reconstruction technique. COMPARISON:  CT 08/22/2022 FINDINGS: Alignment: Mild reversal of cervical lordosis. Trace anterolisthesis C3 on C4 and C4 on C5. Facet alignment is within normal limits. Skull base and vertebrae: No acute fracture. No primary bone lesion or focal pathologic process. Soft tissues and spinal canal: No prevertebral fluid or swelling. No visible canal hematoma. Disc levels: Multilevel degenerative changes. Partial ankylosis C5-C6. Mild disc space narrowing C3-C4 with moderate disc space narrowing C5-C6 and C6-C7. Facet degenerative changes at multiple levels. Upper chest: Emphysema Other: None IMPRESSION: Reversal of cervical lordosis. No acute osseous abnormality. Multilevel degenerative changes. Electronically Signed   By: Jasmine Pang M.D.   On: 01/13/2023 22:18   CT Head Wo Contrast Result Date: 01/13/2023 CLINICAL DATA:  Head trauma EXAM: CT HEAD WITHOUT CONTRAST TECHNIQUE: Contiguous axial images were obtained from the base of the skull through the vertex without intravenous contrast. RADIATION DOSE REDUCTION: This exam was performed according to the departmental dose-optimization program which includes automated exposure control, adjustment of the mA and/or kV according to patient size and/or use of iterative reconstruction  technique. COMPARISON:  CT brain 08/22/2022 FINDINGS: Brain: No acute territorial infarction, hemorrhage or intracranial mass. Mild atrophy. Stable ventricle size Vascular: No hyperdense vessels. 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 No unexpected calcification Skull: Normal. Negative for fracture or focal lesion. Sinuses/Orbits: No acute finding. Other: None IMPRESSION: No CT evidence for acute intracranial abnormality. Mild atrophy. Electronically Signed   By: Jasmine Pang M.D.   On: 01/13/2023 22:13   DG Chest Port 1 View Result Date: 01/13/2023 CLINICAL DATA:  Altered mental status EXAM: PORTABLE CHEST 1 VIEW COMPARISON:  09/19/2022 FINDINGS: No acute airspace disease or effusion. Upper normal cardiac size. Mild central vascular congestion. Possible small left effusion. No pneumothorax IMPRESSION:  Mild central vascular congestion. Possible small left effusion. Electronically Signed   By: Jasmine Pang M.D.   On: 01/13/2023 22:11      Assessment/Plan Active Problems:   * No active hospital problems. *   Assessment and Plan: No notes have been filed under this hospital service. Service: Hospitalist      Active Problems:   * No active hospital problems. *    DVT ppx: SQ Heparin         SQ Lovenox  Code Status: Full code   ***  Family Communication: not done, no family member is at bed side.              Yes, patient's    at bed side.       by phone  Disposition Plan:  Anticipate discharge back to previous environment  Consults called:    Admission status and Level of care: :    Med-surg bed for obs as inpt    progressive unit for obs   as inpt      SDU/inpation        {Inpatient:23812}  Dispo: The patient is from: {From:23814}              Anticipated d/c is to: {To:23815}              Anticipated d/c date is: {Days:23816}              Patient currently {Medically stable:23817}    Severity of  Illness:  {Observation/Inpatient:21159}       Date of Service 01/13/2023    Lorretta Harp [facility]   If 7PM-7AM, please contact night-coverage [email or web address] 01/13/2023, 11:26 PM

## 2023-01-14 ENCOUNTER — Inpatient Hospital Stay: Payer: Medicare Other

## 2023-01-14 ENCOUNTER — Ambulatory Visit: Payer: Medicare Other

## 2023-01-14 DIAGNOSIS — R6510 Systemic inflammatory response syndrome (SIRS) of non-infectious origin without acute organ dysfunction: Secondary | ICD-10-CM | POA: Diagnosis not present

## 2023-01-14 DIAGNOSIS — E878 Other disorders of electrolyte and fluid balance, not elsewhere classified: Secondary | ICD-10-CM | POA: Diagnosis present

## 2023-01-14 DIAGNOSIS — I959 Hypotension, unspecified: Secondary | ICD-10-CM | POA: Insufficient documentation

## 2023-01-14 DIAGNOSIS — M25512 Pain in left shoulder: Secondary | ICD-10-CM | POA: Diagnosis present

## 2023-01-14 DIAGNOSIS — G9341 Metabolic encephalopathy: Secondary | ICD-10-CM | POA: Diagnosis present

## 2023-01-14 LAB — RESPIRATORY PANEL BY PCR

## 2023-01-14 LAB — BASIC METABOLIC PANEL
Anion gap: 7 (ref 5–15)
Anion gap: 8 (ref 5–15)
BUN: 11 mg/dL (ref 8–23)
BUN: 9 mg/dL (ref 8–23)
CO2: 21 mmol/L — ABNORMAL LOW (ref 22–32)
CO2: 21 mmol/L — ABNORMAL LOW (ref 22–32)
Calcium: 7.7 mg/dL — ABNORMAL LOW (ref 8.9–10.3)
Calcium: 7.7 mg/dL — ABNORMAL LOW (ref 8.9–10.3)
Chloride: 110 mmol/L (ref 98–111)
Chloride: 99 mmol/L (ref 98–111)
Creatinine, Ser: 0.79 mg/dL (ref 0.61–1.24)
Creatinine, Ser: 0.93 mg/dL (ref 0.61–1.24)
GFR, Estimated: >60 mL/min (ref >60)
GFR, Estimated: >60 mL/min (ref >60)
Glucose, Bld: 148 mg/dL — ABNORMAL HIGH (ref 70–99)
Glucose, Bld: 189 mg/dL — ABNORMAL HIGH (ref 70–99)
Potassium: 3.1 mmol/L — ABNORMAL LOW (ref 3.5–5.1)
Potassium: 4.6 mmol/L (ref 3.5–5.1)
Sodium: 128 mmol/L — ABNORMAL LOW (ref 135–145)
Sodium: 138 mmol/L (ref 135–145)

## 2023-01-14 LAB — BLOOD CULTURE ID PANEL (REFLEXED) - BCID2

## 2023-01-14 LAB — CBC
HCT: 31.7 % — ABNORMAL LOW (ref 39.0–52.0)
Hemoglobin: 10.5 g/dL — ABNORMAL LOW (ref 13.0–17.0)
MCH: 24.8 pg — ABNORMAL LOW (ref 26.0–34.0)
MCHC: 33.1 g/dL (ref 30.0–36.0)
MCV: 74.8 fL — ABNORMAL LOW (ref 80.0–100.0)
Platelets: 263 10*3/uL (ref 150–400)
RBC: 4.24 MIL/uL (ref 4.22–5.81)
RDW: 18.1 % — ABNORMAL HIGH (ref 11.5–15.5)
WBC: 12.9 10*3/uL — ABNORMAL HIGH (ref 4.0–10.5)
nRBC: 0 % (ref 0.0–0.2)

## 2023-01-14 LAB — TSH: TSH: 1.222 u[IU]/mL (ref 0.350–4.500)

## 2023-01-14 LAB — PHOSPHORUS
Phosphorus: 2 mg/dL — ABNORMAL LOW (ref 2.5–4.6)
Phosphorus: 4.3 mg/dL (ref 2.5–4.6)

## 2023-01-14 LAB — CK: Total CK: 33 U/L — ABNORMAL LOW (ref 49–397)

## 2023-01-14 LAB — OSMOLALITY, URINE: Osmolality, Ur: 282 mosm/kg — ABNORMAL LOW (ref 300–900)

## 2023-01-14 LAB — MAGNESIUM
Magnesium: 1.4 mg/dL — ABNORMAL LOW (ref 1.7–2.4)
Magnesium: 1.5 mg/dL — ABNORMAL LOW (ref 1.7–2.4)
Magnesium: 2.6 mg/dL — ABNORMAL HIGH (ref 1.7–2.4)

## 2023-01-14 LAB — TROPONIN I (HIGH SENSITIVITY): Troponin I (High Sensitivity): 14 ng/L (ref <18)

## 2023-01-14 LAB — SODIUM, URINE, RANDOM: Sodium, Ur: 27 mmol/L

## 2023-01-14 LAB — STREP PNEUMONIAE URINARY ANTIGEN: Strep Pneumo Urinary Antigen: NEGATIVE

## 2023-01-14 LAB — OSMOLALITY: Osmolality: 271 mosm/kg — ABNORMAL LOW (ref 275–295)

## 2023-01-14 MED ORDER — ENSURE ENLIVE PO LIQD
237.0000 mL | Freq: Two times a day (BID) | ORAL | Status: DC
  Administered 2023-01-14 – 2023-01-29 (×22): 237 mL via ORAL

## 2023-01-14 MED ORDER — DM-GUAIFENESIN ER 30-600 MG PO TB12
1.0000 | ORAL_TABLET | Freq: Two times a day (BID) | ORAL | Status: DC | PRN
  Administered 2023-01-15: 1 via ORAL
  Filled 2023-01-14: qty 1

## 2023-01-14 MED ORDER — ADULT MULTIVITAMIN W/MINERALS CH
1.0000 | ORAL_TABLET | Freq: Every day | ORAL | Status: DC
  Administered 2023-01-14 – 2023-01-29 (×14): 1 via ORAL
  Filled 2023-01-14 (×14): qty 1

## 2023-01-14 MED ORDER — PANTOPRAZOLE SODIUM 40 MG PO TBEC
40.0000 mg | DELAYED_RELEASE_TABLET | Freq: Every day | ORAL | Status: DC
  Administered 2023-01-14 – 2023-01-29 (×15): 40 mg via ORAL
  Filled 2023-01-14 (×15): qty 1

## 2023-01-14 MED ORDER — HALOPERIDOL 5 MG PO TABS
10.0000 mg | ORAL_TABLET | Freq: Two times a day (BID) | ORAL | Status: DC
  Administered 2023-01-14 – 2023-01-29 (×30): 10 mg via ORAL
  Filled 2023-01-14 (×31): qty 2

## 2023-01-14 MED ORDER — ALBUTEROL SULFATE (2.5 MG/3ML) 0.083% IN NEBU: 2.5000 mg | INHALATION_SOLUTION | RESPIRATORY_TRACT | Status: DC | PRN

## 2023-01-14 MED ORDER — DEXAMETHASONE SODIUM PHOSPHATE 10 MG/ML IJ SOLN: 10.0000 mg | Freq: Once | INTRAMUSCULAR | Status: DC

## 2023-01-14 MED ORDER — MOMETASONE FURO-FORMOTEROL FUM 100-5 MCG/ACT IN AERO
2.0000 | INHALATION_SPRAY | Freq: Two times a day (BID) | RESPIRATORY_TRACT | Status: DC
  Administered 2023-01-14 – 2023-01-29 (×30): 2 via RESPIRATORY_TRACT
  Filled 2023-01-14: qty 8.8

## 2023-01-14 MED ORDER — SODIUM CHLORIDE 0.9 % IV SOLN
2.0000 g | Freq: Three times a day (TID) | INTRAVENOUS | Status: DC
  Administered 2023-01-14: 2 g via INTRAVENOUS
  Filled 2023-01-14: qty 12.5

## 2023-01-14 MED ORDER — TRAZODONE HCL 100 MG PO TABS
300.0000 mg | ORAL_TABLET | Freq: Every day | ORAL | Status: DC
  Administered 2023-01-14 – 2023-01-28 (×15): 300 mg via ORAL
  Filled 2023-01-14 (×16): qty 3

## 2023-01-14 MED ORDER — SODIUM CHLORIDE 0.9 % IV SOLN
2.0000 g | Freq: Every day | INTRAVENOUS | Status: DC
Start: 2023-01-14 — End: 2023-01-29
  Administered 2023-01-14 – 2023-01-29 (×16): 2 g via INTRAVENOUS
  Filled 2023-01-14 (×16): qty 20

## 2023-01-14 MED ORDER — BENZTROPINE MESYLATE 0.5 MG PO TABS
1.0000 mg | ORAL_TABLET | Freq: Every day | ORAL | Status: DC
  Administered 2023-01-14 – 2023-01-29 (×14): 1 mg via ORAL
  Filled 2023-01-14 (×12): qty 2
  Filled 2023-01-14: qty 1
  Filled 2023-01-14: qty 2

## 2023-01-14 MED ORDER — MAGNESIUM HYDROXIDE 400 MG/5ML PO SUSP: 30.0000 mL | Freq: Every day | ORAL | Status: DC | PRN

## 2023-01-14 MED ORDER — MIDODRINE HCL 5 MG PO TABS
10.0000 mg | ORAL_TABLET | Freq: Three times a day (TID) | ORAL | Status: DC
  Administered 2023-01-14 – 2023-01-29 (×45): 10 mg via ORAL
  Filled 2023-01-14 (×47): qty 2

## 2023-01-14 MED ORDER — POTASSIUM PHOSPHATES 15 MMOLE/5ML IV SOLN
30.0000 mmol | Freq: Once | INTRAVENOUS | Status: AC
  Administered 2023-01-14: 30 mmol via INTRAVENOUS
  Filled 2023-01-14: qty 10

## 2023-01-14 MED ORDER — HYDROXYZINE HCL 25 MG PO TABS
50.0000 mg | ORAL_TABLET | Freq: Two times a day (BID) | ORAL | Status: DC | PRN
  Administered 2023-01-15 (×2): 50 mg via ORAL
  Filled 2023-01-14 (×3): qty 2

## 2023-01-14 MED ORDER — MONTELUKAST SODIUM 10 MG PO TABS
10.0000 mg | ORAL_TABLET | Freq: Every day | ORAL | Status: DC
  Administered 2023-01-14 – 2023-01-29 (×15): 10 mg via ORAL
  Filled 2023-01-14 (×15): qty 1

## 2023-01-14 MED ORDER — OXYCODONE-ACETAMINOPHEN 5-325 MG PO TABS
1.0000 | ORAL_TABLET | ORAL | Status: DC | PRN
  Administered 2023-01-15: 1 via ORAL
  Filled 2023-01-14: qty 1

## 2023-01-14 MED ORDER — LACTATED RINGERS IV SOLN: INTRAVENOUS | Status: AC

## 2023-01-14 MED ORDER — MAGNESIUM SULFATE 4 GM/100ML IV SOLN
4.0000 g | Freq: Once | INTRAVENOUS | Status: AC
  Administered 2023-01-14: 4 g via INTRAVENOUS
  Filled 2023-01-14: qty 100

## 2023-01-14 MED ORDER — LACTATED RINGERS IV BOLUS
1000.0000 mL | Freq: Once | INTRAVENOUS | Status: AC
  Administered 2023-01-14: 1000 mL via INTRAVENOUS

## 2023-01-14 MED ORDER — GADOBUTROL 1 MMOL/ML IV SOLN
7.0000 mL | Freq: Once | INTRAVENOUS | Status: AC | PRN
  Administered 2023-01-14: 7 mL via INTRAVENOUS

## 2023-01-14 MED ORDER — POTASSIUM CHLORIDE CRYS ER 20 MEQ PO TBCR
60.0000 meq | EXTENDED_RELEASE_TABLET | Freq: Once | ORAL | Status: AC
  Administered 2023-01-14: 60 meq via ORAL
  Filled 2023-01-14: qty 3

## 2023-01-14 MED ORDER — POTASSIUM CHLORIDE 10 MEQ/100ML IV SOLN
10.0000 meq | INTRAVENOUS | Status: AC
  Administered 2023-01-14 (×4): 10 meq via INTRAVENOUS
  Filled 2023-01-14 (×4): qty 100

## 2023-01-14 MED ORDER — ENOXAPARIN SODIUM 40 MG/0.4ML IJ SOSY
40.0000 mg | PREFILLED_SYRINGE | INTRAMUSCULAR | Status: DC
  Administered 2023-01-14 – 2023-01-16 (×3): 40 mg via SUBCUTANEOUS
  Filled 2023-01-14 (×4): qty 0.4

## 2023-01-14 MED ORDER — METRONIDAZOLE 500 MG/100ML IV SOLN
500.0000 mg | Freq: Once | INTRAVENOUS | Status: DC
  Administered 2023-01-14: 500 mg via INTRAVENOUS
  Filled 2023-01-14: qty 100

## 2023-01-14 MED ORDER — VANCOMYCIN HCL 1750 MG/350ML IV SOLN: 1750.0000 mg | INTRAVENOUS | Status: DC

## 2023-01-14 MED ORDER — LORAZEPAM 2 MG/ML IJ SOLN
2.0000 mg | Freq: Once | INTRAMUSCULAR | Status: AC | PRN
  Administered 2023-01-14: 2 mg via INTRAVENOUS
  Filled 2023-01-14: qty 1

## 2023-01-14 MED ORDER — HALOPERIDOL 5 MG PO TABS: 10.0000 mg | ORAL_TABLET | Freq: Two times a day (BID) | ORAL | Status: DC

## 2023-01-14 MED ORDER — HYDROCORTISONE SOD SUC (PF) 100 MG IJ SOLR
100.0000 mg | Freq: Once | INTRAMUSCULAR | Status: AC
  Administered 2023-01-14: 100 mg via INTRAVENOUS
  Filled 2023-01-14: qty 2

## 2023-01-14 MED ORDER — IPRATROPIUM-ALBUTEROL 0.5-2.5 (3) MG/3ML IN SOLN
3.0000 mL | Freq: Four times a day (QID) | RESPIRATORY_TRACT | Status: DC
  Administered 2023-01-14 (×3): 3 mL via RESPIRATORY_TRACT
  Filled 2023-01-14 (×4): qty 3

## 2023-01-14 MED ORDER — TAMSULOSIN HCL 0.4 MG PO CAPS
0.4000 mg | ORAL_CAPSULE | Freq: Every day | ORAL | Status: DC
  Administered 2023-01-14 – 2023-01-28 (×14): 0.4 mg via ORAL
  Filled 2023-01-14 (×15): qty 1

## 2023-01-14 NOTE — ED Notes (Signed)
Delayed on administering Benzatropine and Montelukest due to those medications being outside of the approved medications list for Paramedics. Will advise my Primary school teacher.

## 2023-01-14 NOTE — ED Notes (Signed)
MD Wouk made aware that patient's HR is in the 50s. No new orders at this time.

## 2023-01-14 NOTE — ED Notes (Signed)
Bp and hr, provider aware.  No new orders. Patient resting, asymptomatic.

## 2023-01-14 NOTE — ED Notes (Addendum)
MD notified, ordered to check manual.  No further orders.

## 2023-01-14 NOTE — ED Notes (Signed)
Provider at the bedside, assessed the patient.  MD reviewed vital signs, no new orders at this time.  BP lower when pt asleep - improves with arousal.  Patient is alert and oriented - drowsy.  No signs of distress at this time.  Provider Geradine Girt, MD, Charge nurse, and myself at the bedside.

## 2023-01-14 NOTE — Plan of Care (Signed)
Discussed with Dr. Ashok Pall, patient is not a reliable historian and facility is not reachable for collateral information  "Paged by [facility] [place] in [place], [place] paged due to two falls tonight. Patient fell in between dresser and bed. Staff found him in the floor alert. He did not hit his head. He said he did not want to go to the hospital. He fell again 5-6 minutes later, in between and dresser. Staff found him on the ground on all fours, alert. Picked him up off the floor, said he did not want to go the hospital. He started shaking uncontrollably and not easily answering questions. He did not eat dinner tonight. EMS was called and patient was transferred to [facility] for further evaluation as this is not his baseline per staff. Agree with having him seen in the ED."  He was found to be febrile with multiple electrolyte derangements including hyponatremia to 127, mild hypokalemia to 3.4, hypomagnesemia to 1.5, leukocytosis to 12.7 Infectious workup is underway and he was started on cefepime and metronidazole  Presented with fever altered mental status after some shaking activity at home but mental status has rapidly improved with supportive care.   He does have some cognitive impairment noted at baseline, lives in a memory care unit, incontinent of urine at baseline has a history of atrial fibrillation not on anticoagulation per notes from August 2024  Recommend MRI brain with and without contrast and routine EEG.  Cefepime is highly neurotoxic antibiotic, I recommend narrowing if able  Without focal findings if these studies are reassuring and patient remains at baseline outpatient follow-up is appropriate  If there is ictal/interictal abnormality on EEG or acute findings on MRI brain or if patient has additional events concerning for potential seizure I am happy to see the patient in hospital  These are brief curbside recommendations based on very brief review of the  chart

## 2023-01-14 NOTE — Consult Note (Signed)
PHARMACY CONSULT NOTE - ELECTROLYTES  Pharmacy Consult for Electrolyte Monitoring and Replacement   Recent Labs: Weight: 78.9 kg (174 lb) Estimated Creatinine Clearance: 84.7 mL/min (by C-G formula based on SCr of 0.79 mg/dL).  Potassium (mmol/L)  Date Value  01/14/2023 4.6   Magnesium (mg/dL)  Date Value  82/95/6213 2.6 (H)   Calcium (mg/dL)  Date Value  08/65/7846 7.7 (L)   Albumin (g/dL)  Date Value  96/29/5284 2.4 (L)   Phosphorus (mg/dL)  Date Value  13/24/4010 4.3   Sodium (mmol/L)  Date Value  01/14/2023 138   Corrected Ca: 8.98 mg/dL  Assessment  Adrian Gillespie is a 72 y.o. male presenting with altered mental status. PMH significant for hypertension, atrial fibrillation on Eliquis, COPD, GERD, and schizophrenia. Pharmacy has been consulted to monitor and replace electrolytes.  Diet: PO MIVF: LR @ 125 mL/hr  Goal of Therapy: Electrolytes WNL  Plan:  No electrolyte replacement indicated at this time  Check BMP, Mg, Phos with AM labs  Thank you for allowing pharmacy to be a part of this patient's care.  Littie Deeds, PharmD Pharmacy Resident  01/14/2023 9:43 AM

## 2023-01-14 NOTE — ED Notes (Signed)
Patient repositioned in bed for EEG. Patient's sister at bedside.

## 2023-01-14 NOTE — Progress Notes (Signed)
Pharmacy Antibiotic Note  Adrian Gillespie is a 72 y.o. male admitted on 01/13/2023 with sepsis.  Pharmacy has been consulted for Vanc, Cefepime dosing.  Plan: Cefepime 2 gm IV X 1 given in ED on 12/17 @ 2237. Cefepime 2 gm IV Q8H ordered to start on 12/18 @ 0700.  Vancomycin 1750 mg IV X 1 given on 12/17 @ 2321. Vancomycin 1750 mg IV Q24H ordered to start on 12/18 @ 2300.  AUC = 474.9 Vanc trough = 9.4  Weight: 78.9 kg (174 lb)  Temp (24hrs), Avg:99.8 F (37.7 C), Min:97.4 F (36.3 C), Max:102.2 F (39 C)  Recent Labs  Lab 01/13/23 2129 01/13/23 2306 01/13/23 2333  WBC 12.7*  --   --   CREATININE 0.90  --  0.93  LATICACIDVEN 1.6 1.3  --     Estimated Creatinine Clearance: 72.9 mL/min (by C-G formula based on SCr of 0.93 mg/dL).    Allergies  Allergen Reactions   Shellfish-Derived Products Hives    Antimicrobials this admission:   >>    >>   Dose adjustments this admission:   Microbiology results:  BCx:   UCx:    Sputum:    MRSA PCR:   Thank you for allowing pharmacy to be a part of this patient's care.  Rane Blitch D 01/14/2023 3:52 AM

## 2023-01-14 NOTE — Progress Notes (Signed)
Eeg done 

## 2023-01-14 NOTE — Progress Notes (Signed)
PHARMACY - PHYSICIAN COMMUNICATION CRITICAL VALUE ALERT - BLOOD CULTURE IDENTIFICATION (BCID)  Adrian Gillespie is an 72 y.o. male who presented to [facility] on 01/13/2023 with a chief complaint of altered mental status, fever, and fall  Assessment:  12/17 blood culture with GNR, BCID detects E coli  Name of physician (or Provider) Contacted: Dr Ashok Pall  Current antibiotics: none currently,  previously cefepime and metronidazole  Changes to prescribed antibiotics recommended:  Recommendations accepted by provider - ceftriaxone started  Results for orders placed or performed during the hospital encounter of 01/13/23  Blood Culture ID Panel (Reflexed) (Collected: 01/13/2023  9:29 PM)  Result Value Ref Range   Enterococcus faecalis NOT DETECTED NOT DETECTED   Enterococcus Faecium NOT DETECTED NOT DETECTED   Listeria monocytogenes NOT DETECTED NOT DETECTED   Staphylococcus species NOT DETECTED NOT DETECTED   Staphylococcus aureus (BCID) NOT DETECTED NOT DETECTED   Staphylococcus epidermidis NOT DETECTED NOT DETECTED   Staphylococcus lugdunensis NOT DETECTED NOT DETECTED   Streptococcus species NOT DETECTED NOT DETECTED   Streptococcus agalactiae NOT DETECTED NOT DETECTED   Streptococcus pneumoniae NOT DETECTED NOT DETECTED   Streptococcus pyogenes NOT DETECTED NOT DETECTED   A.calcoaceticus-baumannii NOT DETECTED NOT DETECTED   Bacteroides fragilis NOT DETECTED NOT DETECTED   Enterobacterales DETECTED (A) NOT DETECTED   Enterobacter cloacae complex NOT DETECTED NOT DETECTED   Escherichia coli DETECTED (A) NOT DETECTED   Klebsiella aerogenes NOT DETECTED NOT DETECTED   Klebsiella oxytoca NOT DETECTED NOT DETECTED   Klebsiella pneumoniae NOT DETECTED NOT DETECTED   Proteus species NOT DETECTED NOT DETECTED   Salmonella species NOT DETECTED NOT DETECTED   Serratia marcescens NOT DETECTED NOT DETECTED   Haemophilus influenzae NOT DETECTED NOT DETECTED   Neisseria meningitidis NOT DETECTED  NOT DETECTED   Pseudomonas aeruginosa NOT DETECTED NOT DETECTED   Stenotrophomonas maltophilia NOT DETECTED NOT DETECTED   Candida albicans NOT DETECTED NOT DETECTED   Candida auris NOT DETECTED NOT DETECTED   Candida glabrata NOT DETECTED NOT DETECTED   Candida krusei NOT DETECTED NOT DETECTED   Candida parapsilosis NOT DETECTED NOT DETECTED   Candida tropicalis NOT DETECTED NOT DETECTED   Cryptococcus neoformans/gattii NOT DETECTED NOT DETECTED   CTX-M ESBL NOT DETECTED NOT DETECTED   Carbapenem resistance IMP NOT DETECTED NOT DETECTED   Carbapenem resistance KPC NOT DETECTED NOT DETECTED   Carbapenem resistance NDM NOT DETECTED NOT DETECTED   Carbapenem resist OXA 48 LIKE NOT DETECTED NOT DETECTED   Carbapenem resistance VIM NOT DETECTED NOT DETECTED    Juliette Alcide, PharmD, BCPS, BCIDP Work Cell: [phone] 01/14/2023 11:48 AM

## 2023-01-14 NOTE — ED Notes (Signed)
Delayed on administering Mometasone-Formoterol due to in having not arrived from the pharmacy.

## 2023-01-14 NOTE — ED Notes (Addendum)
Patient stated to this RN that he needed to have a BM. This RN assisted patient to be placed on bedpan. Patient had a large BM with brown, solid stool. Patient was cleaned up and placed in a new brief. A new paper pad was placed under the patient. Patient was repositioned and the bed was placed in lowest position. Call light within reach.

## 2023-01-14 NOTE — Progress Notes (Signed)
PT Cancellation Note  Patient Details Name: Adrian Gillespie MRN: [id number] DOB: February 14, 1950   Cancelled Treatment:    Reason Eval/Treat Not Completed: Other (comment);Patient's level of consciousness Spoke with RN who reports pt received Ativan this AM and has been sleeping essentially the entire day.  She reports today probably isn't a good day to try and start PT.  Will maintain on caseload and attempt to see when appropriate.    Malachi Pro, DPT 01/14/2023, 4:15 PM

## 2023-01-14 NOTE — ED Notes (Signed)
Assumed patient care at approximately 0700 and received report from the previous nurse. Patient appears to be resting well.

## 2023-01-14 NOTE — Progress Notes (Signed)
PROGRESS NOTE    Adrian Gillespie  XBM:[id number] DOB: November 26, 1950 DOA: 01/13/2023 PCP: Andres Shad, MD     Brief Narrative:   From admission h and p  Adrian Gillespie is a 72 y.o. male with medical history significant of HTN, HLD, COPD, asthma, GERD, depression, anxiety, schizophrenia, HBV, tobacco abuse, who presents with AMS, fever and fall.   Per report, pt fell twice in [facility] today. It is unclear whether he hit his head or lost consciousness. He report right shoulder pain. Pt was noted to be more confused than usual. When EMS arrived he was found to be febrile and tachycardic.  His temperature is 102.2 in ED.  Patient received 2 L LR bolus in ED.  His mental status has gradually improved.  When I saw patient in ED, he is alert, knows his own name, knows that he is in hospital, confused about time.  He moves all extremities normally.  Answered most of questions appropriately.  He reports dry cough, denies chest pain, nausea, vomiting, diarrhea or abdominal pain.  No symptoms of UTI.  He denies shortness  of breath, but was found to have oxygen desaturation to 86% on room air, which improved to 97% on 2 L oxygen.  Patient states that he was on oxygen, but does not remember how much oxygen he was using at home.  Patient does not have neck pain, no neck rigidity on my examination.   Assessment & Plan:   Principal Problem:   SIRS (systemic inflammatory response syndrome) (HCC) Active Problems:   Fall at home, initial encounter   Left shoulder pain   Chronic obstructive pulmonary disease (HCC)   AF (paroxysmal atrial fibrillation) (HCC)   Hypokalemia   Hyponatremia   Electrolyte disturbance   Hypophosphatemia   Hypomagnesemia   HTN (hypertension)   Prolonged QT interval   Acute metabolic encephalopathy   Schizophrenia (HCC)   Hypotension  # Fever Etiology unclear. No respiratory symptoms and cxr is clear. No urinary symptoms and urinalysis is unremarkable. No headache, neck pain  or stiffness to suggest meningitis. Seizure is one possibility. Is on haldol so nms also possible though no muscle rigidity. Normal lactic acid - monitor blood cultures - add on urine culture - check hiv - consider CT imaging if persistent fever, also consider rheumatologic w/u - f/u ck, resume haldol if reassuringly low - will stop abx for now  # Seizure concern Report of fall yesterday, shaking, and confusion afterward that has now resolved. Discussed w/ neuro, hard to tell what's going on here. Advising: - mri brain - eeg - further neuro w/u if either abnormal  # Frequent falls Likely multifactorial - PT consult - treating conditions as below  # Hyponatremia Possibly mixed picture siadh and hypovolemia. Urine sodium low. Received hydrocortisone last night so little utility in checking a cortisol level - continue fluids, further w/u if fails to improve with fluids - f/u tsh  # Hypokalemia # hypomagnesmia - replete and monitor  # QTC prolongation Likely 2/2 psych meds exacerbated by electrolyte abnormalities, has improved on most recent EKG - treat electrolyte abnormalities as above - monitor on tele  # COPD Quiescent - home inhalers  # Hypotension Likely multifactorial. Meds likely contributing. On midodrine per pcp - continue midodrine  # Schizophrenia No behavioral disturbance - resume haldol if ck reassuring - on monthly invega as well - trazodone at bedtime - psych consult  DVT prophylaxis: lovenox Code Status: full Family Communication: sister updated @ bedside  Level  of care: Progressive Status is: Inpatient Remains inpatient appropriate because: need for inpatient w/u    Consultants:  psych  Procedures: none  Antimicrobials:  S/p vanc/cefepime/flagyl    Subjective: No pain, no cough, no headache or neck pain, no abd pain, no n/v/d, no dysuria  Objective: Vitals:   01/14/23 0650 01/14/23 0700 01/14/23 0730 01/14/23 0744  BP:  92/66 (!)  87/65   Pulse: 67 (!) 52 (!) 46   Resp: 17 13 14    Temp:    97.7 F (36.5 C)  TempSrc:    Axillary  SpO2: 98% 96% 97%   Weight:        Intake/Output Summary (Last 24 hours) at 01/14/2023 0836 Last data filed at 01/14/2023 9147 Gross per 24 hour  Intake 487.02 ml  Output --  Net 487.02 ml   Filed Weights   01/13/23 2200  Weight: 78.9 kg    Examination:  General exam: Appears calm and comfortable, disheveled Respiratory system: Clear to auscultation. Respiratory effort normal. Cardiovascular system: S1 & S2 heard, RRR.   Gastrointestinal system: Abdomen is nondistended, soft and nontender.   Central nervous system: Alert and oriented to self. No focal neurological deficits. Extremities: Symmetric 5 x 5 power. Skin: No rashes, lesions or ulcers Psychiatry: bit confused and with abnormal affect    Data Reviewed: I have personally reviewed following labs and imaging studies  CBC: Recent Labs  Lab 01/13/23 2129 01/14/23 0657  WBC 12.7* 12.9*  NEUTROABS 11.6*  --   HGB 10.2* 10.5*  HCT 30.4* 31.7*  MCV 73.4* 74.8*  PLT 284 263   Basic Metabolic Panel: Recent Labs  Lab 01/13/23 2129 01/13/23 2333  NA 127* 128*  K 3.4* 3.1*  CL 98 99  CO2 20* 21*  GLUCOSE 126* 148*  BUN 11 11  CREATININE 0.90 0.93  CALCIUM 7.8* 7.7*  MG  --  1.4*  PHOS  --  2.0*   GFR: Estimated Creatinine Clearance: 72.9 mL/min (by C-G formula based on SCr of 0.93 mg/dL). Liver Function Tests: Recent Labs  Lab 01/13/23 2129  AST 22  ALT 18  ALKPHOS 87  BILITOT 1.4*  PROT 6.3*  ALBUMIN 2.4*   No results for input(s): "LIPASE", "AMYLASE" in the last 168 hours. No results for input(s): "AMMONIA" in the last 168 hours. Coagulation Profile: Recent Labs  Lab 01/13/23 2129  INR 1.3*   Cardiac Enzymes: No results for input(s): "CKTOTAL", "CKMB", "CKMBINDEX", "TROPONINI" in the last 168 hours. BNP (last 3 results) No results for input(s): "PROBNP" in the last 8760  hours. HbA1C: No results for input(s): "HGBA1C" in the last 72 hours. CBG: No results for input(s): "GLUCAP" in the last 168 hours. Lipid Profile: No results for input(s): "CHOL", "HDL", "LDLCALC", "TRIG", "CHOLHDL", "LDLDIRECT" in the last 72 hours. Thyroid Function Tests: No results for input(s): "TSH", "T4TOTAL", "FREET4", "T3FREE", "THYROIDAB" in the last 72 hours. Anemia Panel: No results for input(s): "VITAMINB12", "FOLATE", "FERRITIN", "TIBC", "IRON", "RETICCTPCT" in the last 72 hours. Urine analysis:    Component Value Date/Time   COLORURINE YELLOW (A) 01/13/2023 2130   APPEARANCEUR CLEAR (A) 01/13/2023 2130   LABSPEC 1.010 01/13/2023 2130   PHURINE 6.0 01/13/2023 2130   GLUCOSEU NEGATIVE 01/13/2023 2130   HGBUR SMALL (A) 01/13/2023 2130   BILIRUBINUR NEGATIVE 01/13/2023 2130   KETONESUR NEGATIVE 01/13/2023 2130   PROTEINUR NEGATIVE 01/13/2023 2130   NITRITE NEGATIVE 01/13/2023 2130   LEUKOCYTESUR NEGATIVE 01/13/2023 2130   Sepsis Labs: @LABRCNTIP (procalcitonin:4,lacticidven:4)  ) Recent Results (  from the past 240 hours)  Resp panel by RT-PCR (RSV, Flu A&B, Covid) Anterior Nasal Swab     Status: None   Collection Time: 01/13/23  9:29 PM   Specimen: Anterior Nasal Swab  Result Value Ref Range Status   SARS Coronavirus 2 by RT PCR NEGATIVE NEGATIVE Final    Comment: (NOTE) SARS-CoV-2 target nucleic acids are NOT DETECTED.  The SARS-CoV-2 RNA is generally detectable in upper respiratory specimens during the acute phase of infection. The lowest concentration of SARS-CoV-2 viral copies this assay can detect is 138 copies/mL. A negative result does not preclude SARS-Cov-2 infection and should not be used as the sole basis for treatment or other patient management decisions. A negative result may occur with  improper specimen collection/handling, submission of specimen other than nasopharyngeal swab, presence of viral mutation(s) within the areas targeted by this  assay, and inadequate number of viral copies(<138 copies/mL). A negative result must be combined with clinical observations, patient history, and epidemiological information. The expected result is Negative.  Fact Sheet for Patients:  BloggerCourse.com  Fact Sheet for Healthcare Providers:  SeriousBroker.it  This test is no t yet approved or cleared by the [place] FDA and  has been authorized for detection and/or diagnosis of SARS-CoV-2 by FDA under an Emergency Use Authorization (EUA). This EUA will remain  in effect (meaning this test can be used) for the duration of the COVID-19 declaration under Section 564(b)(1) of the Act, 21 U.S.C.section 360bbb-3(b)(1), unless the authorization is terminated  or revoked sooner.       Influenza A by PCR NEGATIVE NEGATIVE Final   Influenza B by PCR NEGATIVE NEGATIVE Final    Comment: (NOTE) The Xpert Xpress SARS-CoV-2/FLU/RSV plus assay is intended as an aid in the diagnosis of influenza from Nasopharyngeal swab specimens and should not be used as a sole basis for treatment. Nasal washings and aspirates are unacceptable for Xpert Xpress SARS-CoV-2/FLU/RSV testing.  Fact Sheet for Patients: BloggerCourse.com  Fact Sheet for Healthcare Providers: SeriousBroker.it  This test is not yet approved or cleared by the [place] FDA and has been authorized for detection and/or diagnosis of SARS-CoV-2 by FDA under an Emergency Use Authorization (EUA). This EUA will remain in effect (meaning this test can be used) for the duration of the COVID-19 declaration under Section 564(b)(1) of the Act, 21 U.S.C. section 360bbb-3(b)(1), unless the authorization is terminated or revoked.     Resp Syncytial Virus by PCR NEGATIVE NEGATIVE Final    Comment: (NOTE) Fact Sheet for Patients: BloggerCourse.com  Fact Sheet for  Healthcare Providers: SeriousBroker.it  This test is not yet approved or cleared by the [place] FDA and has been authorized for detection and/or diagnosis of SARS-CoV-2 by FDA under an Emergency Use Authorization (EUA). This EUA will remain in effect (meaning this test can be used) for the duration of the COVID-19 declaration under Section 564(b)(1) of the Act, 21 U.S.C. section 360bbb-3(b)(1), unless the authorization is terminated or revoked.  Performed at [facility], [address] [place], [place] [address]   Blood Culture (routine x 2)     Status: None (Preliminary result)   Collection Time: 01/13/23  9:29 PM   Specimen: BLOOD  Result Value Ref Range Status   Specimen Description BLOOD BLOOD LEFT ARM  Final   Special Requests   Final    BOTTLES DRAWN AEROBIC AND ANAEROBIC Blood Culture adequate volume   Culture   Final    NO GROWTH < 12 HOURS  Performed at [facility], [address] [place], [place] [address]    Report Status PENDING  Incomplete  Blood Culture (routine x 2)     Status: None (Preliminary result)   Collection Time: 01/13/23 11:06 PM   Specimen: BLOOD  Result Value Ref Range Status   Specimen Description BLOOD RIGHT ARM  Final   Special Requests   Final    BOTTLES DRAWN AEROBIC AND ANAEROBIC Blood Culture adequate volume   Culture   Final    NO GROWTH < 12 HOURS Performed at [facility], [address] [place], [place] [address]    Report Status PENDING  Incomplete         Radiology Studies: DG Shoulder Left Result Date: 01/14/2023 CLINICAL DATA:  Left shoulder pain after fall EXAM: LEFT SHOULDER - 2+ VIEW COMPARISON:  Left shoulder x-ray 08/15/2017 FINDINGS: There is no evidence of fracture or dislocation. There is no evidence of arthropathy or other focal bone abnormality. Soft tissues are unremarkable. IMPRESSION: Negative. Electronically Signed   By: Darliss Cheney M.D.   On:  01/14/2023 03:20   CT Cervical Spine Wo Contrast Result Date: 01/13/2023 CLINICAL DATA:  Trauma fall EXAM: CT CERVICAL SPINE WITHOUT CONTRAST TECHNIQUE: Multidetector CT imaging of the cervical spine was performed without intravenous contrast. Multiplanar CT image reconstructions were also generated. RADIATION DOSE REDUCTION: This exam was performed according to the departmental dose-optimization program which includes automated exposure control, adjustment of the mA and/or kV according to patient size and/or use of iterative reconstruction technique. COMPARISON:  CT 08/22/2022 FINDINGS: Alignment: Mild reversal of cervical lordosis. Trace anterolisthesis C3 on C4 and C4 on C5. Facet alignment is within normal limits. Skull base and vertebrae: No acute fracture. No primary bone lesion or focal pathologic process. Soft tissues and spinal canal: No prevertebral fluid or swelling. No visible canal hematoma. Disc levels: Multilevel degenerative changes. Partial ankylosis C5-C6. Mild disc space narrowing C3-C4 with moderate disc space narrowing C5-C6 and C6-C7. Facet degenerative changes at multiple levels. Upper chest: Emphysema Other: None IMPRESSION: Reversal of cervical lordosis. No acute osseous abnormality. Multilevel degenerative changes. Electronically Signed   By: Jasmine Pang M.D.   On: 01/13/2023 22:18   CT Head Wo Contrast Result Date: 01/13/2023 CLINICAL DATA:  Head trauma EXAM: CT HEAD WITHOUT CONTRAST TECHNIQUE: Contiguous axial images were obtained from the base of the skull through the vertex without intravenous contrast. RADIATION DOSE REDUCTION: This exam was performed according to the departmental dose-optimization program which includes automated exposure control, adjustment of the mA and/or kV according to patient size and/or use of iterative reconstruction technique. COMPARISON:  CT brain 08/22/2022 FINDINGS: Brain: No acute territorial infarction, hemorrhage or intracranial mass. Mild  atrophy. Stable ventricle size Vascular: No hyperdense vessels. 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 No unexpected calcification Skull: Normal. Negative for fracture or focal lesion. Sinuses/Orbits: No acute finding. Other: None IMPRESSION: No CT evidence for acute intracranial abnormality. Mild atrophy. Electronically Signed   By: Jasmine Pang M.D.   On: 01/13/2023 22:13   DG Chest Port 1 View Result Date: 01/13/2023 CLINICAL DATA:  Altered mental status EXAM: PORTABLE CHEST 1 VIEW COMPARISON:  09/19/2022 FINDINGS: No acute airspace disease or effusion. Upper normal cardiac size. Mild central vascular congestion. Possible small left effusion. No pneumothorax IMPRESSION: Mild central vascular congestion. Possible small left effusion. Electronically Signed   By: Jasmine Pang M.D.   On: 01/13/2023 22:11        Scheduled Meds:  benztropine  1 mg Oral Daily   feeding supplement  237 mL Oral BID BM   ipratropium-albuterol  3 mL Nebulization Q6H   midodrine  10 mg Oral TID WC   montelukast  10 mg Oral Daily   multivitamin with minerals  1 tablet Oral Daily   nicotine  21 mg Transdermal Daily   pantoprazole  40 mg Oral Daily   Continuous Infusions:  ceFEPime (MAXIPIME) IV Stopped (01/14/23 4098)   lactated ringers 125 mL/hr at 01/14/23 0210   metronidazole 500 mg (01/14/23 0829)   potassium PHOSPHATE IVPB (in mmol) 30 mmol (01/14/23 0254)   vancomycin       LOS: 1 day     Silvano Bilis, MD [facility]   If 7PM-7AM, please contact night-coverage [email or web address] Password [facility] 01/14/2023, 8:36 AM

## 2023-01-14 NOTE — ED Notes (Addendum)
MD Aware. No new orders at this time. Randye Lobo, MD

## 2023-01-14 NOTE — ED Notes (Signed)
Patient is awake with family in the room. Oxygen removed by the provider.

## 2023-01-14 NOTE — ED Notes (Signed)
Writer in room to view pt position based on BP reading. Pt with urine soaked bedding, gown and brief. Assigned Publishing rights manager addressed with peritoneal care, applying clean/dry brief and linen. All pts belongings removed placed into hospital belongings bag and placed at pts bedside.   Pt at risk for High fall with no intervention in place on assessment. Writer applied non-slip socks, yellow fall bracelet and activated fall pad alarm with volume set to "high." Door magnet placed on outside of pts door with door remaining open.

## 2023-01-14 NOTE — Progress Notes (Signed)
PHARMACY CONSULT NOTE - FOLLOW UP  Pharmacy Consult for Electrolyte Monitoring and Replacement   Recent Labs: Potassium (mmol/L)  Date Value  01/13/2023 3.1 (L)   Magnesium (mg/dL)  Date Value  78/46/9629 1.4 (L)   Calcium (mg/dL)  Date Value  52/84/1324 7.7 (L)   Albumin (g/dL)  Date Value  40/10/2723 2.4 (L)   Phosphorus (mg/dL)  Date Value  36/64/4034 2.0 (L)   Sodium (mmol/L)  Date Value  01/13/2023 128 (L)     Assessment: 12/17 @ 2333:  K = 3.1                           Phos = 2.0                           Mag = 1.4  Goal of Therapy:  Electrolytes WNL   Plan:  Mag Sulfate 4 gm IV X 1  K Phos 30 mmol IV X 1  - Will order KCl 10 mEq IV X 4 - Will recheck electrolytes with AM labs   Demetry Bendickson D ,PharmD Clinical Pharmacist 01/14/2023 3:06 AM

## 2023-01-15 ENCOUNTER — Other Ambulatory Visit: Payer: Self-pay

## 2023-01-15 DIAGNOSIS — R6510 Systemic inflammatory response syndrome (SIRS) of non-infectious origin without acute organ dysfunction: Secondary | ICD-10-CM | POA: Diagnosis not present

## 2023-01-15 DIAGNOSIS — R32 Unspecified urinary incontinence: Secondary | ICD-10-CM

## 2023-01-15 DIAGNOSIS — G9341 Metabolic encephalopathy: Secondary | ICD-10-CM | POA: Diagnosis not present

## 2023-01-15 DIAGNOSIS — A419 Sepsis, unspecified organism: Secondary | ICD-10-CM

## 2023-01-15 DIAGNOSIS — R4182 Altered mental status, unspecified: Secondary | ICD-10-CM

## 2023-01-15 DIAGNOSIS — B9620 Unspecified Escherichia coli [E. coli] as the cause of diseases classified elsewhere: Secondary | ICD-10-CM

## 2023-01-15 DIAGNOSIS — R569 Unspecified convulsions: Secondary | ICD-10-CM

## 2023-01-15 DIAGNOSIS — R7881 Bacteremia: Secondary | ICD-10-CM

## 2023-01-15 LAB — BASIC METABOLIC PANEL
Anion gap: 8 (ref 5–15)
BUN: 10 mg/dL (ref 8–23)
CO2: 21 mmol/L — ABNORMAL LOW (ref 22–32)
Calcium: 7.9 mg/dL — ABNORMAL LOW (ref 8.9–10.3)
Chloride: 104 mmol/L (ref 98–111)
Creatinine, Ser: 0.75 mg/dL (ref 0.61–1.24)
GFR, Estimated: >60 mL/min (ref >60)
Glucose, Bld: 99 mg/dL (ref 70–99)
Potassium: 4.1 mmol/L (ref 3.5–5.1)
Sodium: 133 mmol/L — ABNORMAL LOW (ref 135–145)

## 2023-01-15 LAB — URINE CULTURE: Culture: NO GROWTH

## 2023-01-15 LAB — PHOSPHORUS: Phosphorus: 2.5 mg/dL (ref 2.5–4.6)

## 2023-01-15 LAB — MAGNESIUM: Magnesium: 2 mg/dL (ref 1.7–2.4)

## 2023-01-15 LAB — HIV ANTIBODY (ROUTINE TESTING W REFLEX): HIV Screen 4th Generation wRfx: NONREACTIVE

## 2023-01-15 NOTE — Consult Note (Signed)
[facility] Face-to-Face Psychiatry Consult   Reason for Consult: Altered mental status Referring Physician:    Kathrynn Running, MD   Patient Identification: Adrian Gillespie MRN:  [id number] Principal Diagnosis: SIRS (systemic inflammatory response syndrome) (HCC) Diagnosis:  Principal Problem:   SIRS (systemic inflammatory response syndrome) (HCC) Active Problems:   Hypokalemia   Schizophrenia (HCC)   Chronic obstructive pulmonary disease (HCC)   HTN (hypertension)   AF (paroxysmal atrial fibrillation) (HCC)   Fall at home, initial encounter   Hyponatremia   Prolonged QT interval   Electrolyte disturbance   Hypophosphatemia   Acute metabolic encephalopathy   Left shoulder pain   Hypomagnesemia   Hypotension   Total Time spent with patient: 20 minutes  Subjective:   Adrian Gillespie is a 72 y.o. male patient admitted with altered mental status with slightly elevated temperature and white blood cell count.  He is living in a SNF and carries a diagnosis of depression and schizophrenia.  Apparently, he has been falling more often than usual.  I do not believe physical therapy has had a chance to work with him quite yet.  Neurology saw him and his EEG showed some slowing possibly consistent with mild encephalopathy.  MENTAL STATUS EXAM: Patient is alert and oriented x 2, pleasant and cooperative, good eye contact, speech is normal and difficult to understand.  It seems like he wants to tell me things but is able to tell me that he does not feel well., mood is depressed; affect is flat; thought process: goal directed for the most part; thought content: He denies any suicidal ideation; judgment and insight are difficult to judge.  Assessment: His altered mental status exam seems to be more organic in nature than psychiatric.  I would watch him and then have him work with physical therapy and see how he does but I do not believe any medication changes are needed at this time. 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)    HLD (hyperlipidemia)    HTN (hypertension)    Schizophrenia (HCC)     Past Surgical History:  Procedure Laterality Date   INTRAMEDULLARY (IM) NAIL INTERTROCHANTERIC Right 09/19/2022   Procedure: INTRAMEDULLARY (IM) NAIL INTERTROCHANTERIC;  Surgeon: Reinaldo Berber, MD;  Location: [facility] ORS;  Service: Orthopedics;  Laterality: Right;   Family History:  Family History  Problem Relation Age of Onset   Arthritis/Rheumatoid Mother    Family Psychiatric  History: Unremarkable Social History:  Social History   Substance and Sexual Activity  Alcohol Use Not Currently     Social History   Substance and Sexual Activity  Drug Use Never    Social History   Socioeconomic History   Marital status: Single    Spouse name: Not on file   Number of children: Not on file   Years of education: Not on file   Highest education level: Not on file  Occupational History   Not on file  Tobacco Use   Smoking status: Every Day    Current packs/day: 0.50    Types: Cigarettes   Smokeless tobacco: Never   Tobacco comments:  quit 1 month ago  Vaping Use   Vaping status: Never Used  Substance and Sexual Activity   Alcohol use: Not Currently   Drug use: Never   Sexual activity: Not on file  Other Topics Concern   Not on file  Social History Narrative   Not on file   Social Drivers of Health   Financial Resource Strain: Medium Risk (06/26/2022)   Received from [facility], [facility]   Overall Financial Resource Strain (CARDIA)    Difficulty of Paying Living Expenses: Somewhat hard  Food Insecurity:  No Food Insecurity (01/15/2023)   Hunger Vital Sign    Worried About Running Out of Food in the Last Year: Never true    Ran Out of Food in the Last Year: Never true  Transportation Needs: No Transportation Needs (01/15/2023)   PRAPARE - Administrator, Civil Service (Medical): No    Lack of Transportation (Non-Medical): No  Physical Activity: Sufficiently Active (06/26/2022)   Received from [facility], [facility]   Exercise Vital Sign    Days of Exercise per Week: 6 days    Minutes of Exercise per Session: 60 min  Stress: No Stress Concern Present (06/26/2022)   Received from [facility], [facility] - Occupational Stress Questionnaire    Feeling of Stress : Only a little  Social Connections: Moderately Isolated (07/18/2020)   Received from [facility], [facility]   Social Connection and Isolation Panel [NHANES]    Frequency of Communication with Friends and Family: More than three times a week    Frequency of Social Gatherings with Friends and Family: Never    Attends Religious Services: Never    Database administrator or Organizations: No    Attends Banker Meetings: Never    Marital Status: Living with partner   Additional Social History:    Allergies:   Allergies  Allergen Reactions   Shellfish-Derived Products Hives    Labs:  Results for orders placed or performed during the hospital encounter of 01/13/23 (from the past 48 hours)  Magnesium     Status: Abnormal   Collection Time: 01/13/23  9:28 PM  Result Value Ref Range   Magnesium 1.5 (L) 1.7 - 2.4 mg/dL    Comment: Performed at [facility], [address] [place], [place] [address]  CK     Status: Abnormal   Collection Time: 01/13/23  9:28 PM  Result Value Ref Range   Total CK 33 (L) 49 - 397 U/L    Comment: Performed at [facility], [address] [place], [place] [address]  Resp panel by RT-PCR  (RSV, Flu A&B, Covid) Anterior Nasal Swab     Status: None   Collection Time: 01/13/23  9:29 PM   Specimen: Anterior Nasal Swab  Result Value Ref Range   SARS Coronavirus 2 by RT PCR NEGATIVE NEGATIVE    Comment: (NOTE) SARS-CoV-2 target nucleic acids are NOT DETECTED.  The SARS-CoV-2 RNA is generally detectable in upper respiratory specimens during the acute phase of infection. The lowest concentration of SARS-CoV-2 viral copies this assay can detect is 138 copies/mL. A negative result does not preclude SARS-Cov-2 infection and should not be used as the sole basis for treatment or other patient management decisions. A negative result may occur with  improper specimen collection/handling, submission of specimen other than nasopharyngeal swab, presence of viral mutation(s)  within the areas targeted by this assay, and inadequate number of viral copies(<138 copies/mL). A negative result must be combined with clinical observations, patient history, and epidemiological information. The expected result is Negative.  Fact Sheet for Patients:  BloggerCourse.com  Fact Sheet for Healthcare Providers:  SeriousBroker.it  This test is no t yet approved or cleared by the [place] FDA and  has been authorized for detection and/or diagnosis of SARS-CoV-2 by FDA under an Emergency Use Authorization (EUA). This EUA will remain  in effect (meaning this test can be used) for the duration of the COVID-19 declaration under Section 564(b)(1) of the Act, 21 U.S.C.section 360bbb-3(b)(1), unless the authorization is terminated  or revoked sooner.       Influenza A by PCR NEGATIVE NEGATIVE   Influenza B by PCR NEGATIVE NEGATIVE    Comment: (NOTE) The Xpert Xpress SARS-CoV-2/FLU/RSV plus assay is intended as an aid in the diagnosis of influenza from Nasopharyngeal swab specimens and should not be used as a sole basis for treatment. Nasal washings  and aspirates are unacceptable for Xpert Xpress SARS-CoV-2/FLU/RSV testing.  Fact Sheet for Patients: BloggerCourse.com  Fact Sheet for Healthcare Providers: SeriousBroker.it  This test is not yet approved or cleared by the [place] FDA and has been authorized for detection and/or diagnosis of SARS-CoV-2 by FDA under an Emergency Use Authorization (EUA). This EUA will remain in effect (meaning this test can be used) for the duration of the COVID-19 declaration under Section 564(b)(1) of the Act, 21 U.S.C. section 360bbb-3(b)(1), unless the authorization is terminated or revoked.     Resp Syncytial Virus by PCR NEGATIVE NEGATIVE    Comment: (NOTE) Fact Sheet for Patients: BloggerCourse.com  Fact Sheet for Healthcare Providers: SeriousBroker.it  This test is not yet approved or cleared by the [place] FDA and has been authorized for detection and/or diagnosis of SARS-CoV-2 by FDA under an Emergency Use Authorization (EUA). This EUA will remain in effect (meaning this test can be used) for the duration of the COVID-19 declaration under Section 564(b)(1) of the Act, 21 U.S.C. section 360bbb-3(b)(1), unless the authorization is terminated or revoked.  Performed at [facility], [address] [place], [place] [address]   Lactic acid, plasma     Status: None   Collection Time: 01/13/23  9:29 PM  Result Value Ref Range   Lactic Acid, Venous 1.6 0.5 - 1.9 mmol/L    Comment: Performed at [facility], [address] [place], [place] [address]  Comprehensive metabolic panel     Status: Abnormal   Collection Time: 01/13/23  9:29 PM  Result Value Ref Range   Sodium 127 (L) 135 - 145 mmol/L   Potassium 3.4 (L) 3.5 - 5.1 mmol/L   Chloride 98 98 - 111 mmol/L   CO2 20 (L) 22 - 32 mmol/L   Glucose, Bld 126 (H) 70 - 99 mg/dL    Comment: Glucose reference  range applies only to samples taken after fasting for at least 8 hours.   BUN 11 8 - 23 mg/dL   Creatinine, Ser 9.62 0.61 - 1.24 mg/dL   Calcium 7.8 (L) 8.9 - 10.3 mg/dL   Total Protein 6.3 (L) 6.5 - 8.1 g/dL   Albumin 2.4 (L) 3.5 - 5.0 g/dL   AST 22 15 - 41 U/L   ALT 18 0 - 44 U/L   Alkaline Phosphatase 87 38 - 126 U/L   Total Bilirubin 1.4 (H) <1.2 mg/dL   GFR, Estimated >95 >28  mL/min    Comment: (NOTE) Calculated using the CKD-EPI Creatinine Equation (2021)    Anion gap 9 5 - 15    Comment: Performed at [facility], [address] [place], [place] [address]  CBC with Differential     Status: Abnormal   Collection Time: 01/13/23  9:29 PM  Result Value Ref Range   WBC 12.7 (H) 4.0 - 10.5 K/uL   RBC 4.14 (L) 4.22 - 5.81 MIL/uL   Hemoglobin 10.2 (L) 13.0 - 17.0 g/dL   HCT 76.2 (L) 83.1 - 51.7 %   MCV 73.4 (L) 80.0 - 100.0 fL   MCH 24.6 (L) 26.0 - 34.0 pg   MCHC 33.6 30.0 - 36.0 g/dL   RDW 61.6 (H) 07.3 - 71.0 %   Platelets 284 150 - 400 K/uL   nRBC 0.0 0.0 - 0.2 %   Neutrophils Relative % 92 %   Neutro Abs 11.6 (H) 1.7 - 7.7 K/uL   Lymphocytes Relative 3 %   Lymphs Abs 0.4 (L) 0.7 - 4.0 K/uL   Monocytes Relative 4 %   Monocytes Absolute 0.5 0.1 - 1.0 K/uL   Eosinophils Relative 0 %   Eosinophils Absolute 0.0 0.0 - 0.5 K/uL   Basophils Relative 0 %   Basophils Absolute 0.0 0.0 - 0.1 K/uL   Immature Granulocytes 1 %   Abs Immature Granulocytes 0.13 (H) 0.00 - 0.07 K/uL    Comment: Performed at [facility], [address] [place], [place] [address]  Protime-INR     Status: Abnormal   Collection Time: 01/13/23  9:29 PM  Result Value Ref Range   Prothrombin Time 15.9 (H) 11.4 - 15.2 seconds   INR 1.3 (H) 0.8 - 1.2    Comment: (NOTE) INR goal varies based on device and disease states. Performed at [facility], [address] [place], [place] [address]   APTT     Status: Abnormal   Collection Time: 01/13/23  9:29 PM  Result Value Ref  Range   aPTT 38 (H) 24 - 36 seconds    Comment:        IF BASELINE aPTT IS ELEVATED, SUGGEST PATIENT RISK ASSESSMENT BE USED TO DETERMINE APPROPRIATE ANTICOAGULANT THERAPY. Performed at [facility], [address] [place], [place] [address]   Blood Culture (routine x 2)     Status: Abnormal (Preliminary result)   Collection Time: 01/13/23  9:29 PM   Specimen: BLOOD  Result Value Ref Range   Specimen Description      BLOOD BLOOD LEFT ARM Performed at [facility], [address] [place], [place] [address]    Special Requests      BOTTLES DRAWN AEROBIC AND ANAEROBIC Blood Culture adequate volume Performed at [facility], [address] [place], [place] [address]    Culture  Setup Time      Organism ID to follow ANAEROBIC BOTTLE ONLY GRAM NEGATIVE RODS CRITICAL RESULT CALLED TO, READ BACK BY AND VERIFIED WITH: TREY GREENWOOD 01/14/23 1142 KLW Performed at [facility], [address] [place], [place] [address]    Culture (A)     ESCHERICHIA COLI SUSCEPTIBILITIES TO FOLLOW Performed at [facility], [address] [place], [place] [address]    Report Status PENDING   Procalcitonin     Status: None   Collection Time: 01/13/23  9:29 PM  Result Value Ref Range   Procalcitonin 0.46 ng/mL    Comment:  Interpretation: PCT (Procalcitonin) <= 0.5 ng/mL: Systemic infection (sepsis) is not likely. Local bacterial infection is possible. (NOTE)       Sepsis PCT Algorithm           Lower Respiratory Tract                                      Infection PCT Algorithm    ----------------------------     ----------------------------         PCT < 0.25 ng/mL                PCT < 0.10 ng/mL          Strongly encourage             Strongly discourage   discontinuation of antibiotics    initiation of antibiotics    ----------------------------     -----------------------------       PCT 0.25 - 0.50 ng/mL            PCT 0.10 - 0.25  ng/mL               OR       >80% decrease in PCT            Discourage initiation of                                            antibiotics      Encourage discontinuation           of antibiotics    ----------------------------     -----------------------------         PCT >= 0.50 ng/mL              PCT 0.26 - 0.50 ng/mL               AND        <80% decrease in PCT             Encourage initiation of                                             antibiotics       Encourage continuation           of antibiotics    ----------------------------     -----------------------------        PCT >= 0.50 ng/mL                  PCT > 0.50 ng/mL               AND         increase in PCT                  Strongly encourage                                      initiation of antibiotics    Strongly encourage escalation           of antibiotics                                     -----------------------------  PCT <= 0.25 ng/mL                                                 OR                                        > 80% decrease in PCT                                      Discontinue / Do not initiate                                             antibiotics  Performed at [facility], [address] [place], [place] [address]   Troponin I (High Sensitivity)     Status: None   Collection Time: 01/13/23  9:29 PM  Result Value Ref Range   Troponin I (High Sensitivity) 12 <18 ng/L    Comment: (NOTE) Elevated high sensitivity troponin I (hsTnI) values and significant  changes across serial measurements may suggest ACS but many other  chronic and acute conditions are known to elevate hsTnI results.  Refer to the "Links" section for chest pain algorithms and additional  guidance. Performed at [facility], [address] [place], [place] [address]   Osmolality     Status: Abnormal   Collection Time: 01/13/23  9:29 PM  Result Value  Ref Range   Osmolality 271 (L) 275 - 295 mOsm/kg    Comment: REPEATED TO VERIFY Performed at [facility], [address] [place], [place] [address]   Osmolality, urine     Status: Abnormal   Collection Time: 01/13/23  9:29 PM  Result Value Ref Range   Osmolality, Ur 282 (L) 300 - 900 mOsm/kg    Comment: REPEATED TO VERIFY Performed at [facility], [address] [place], [place] [address]   Sodium, urine, random     Status: None   Collection Time: 01/13/23  9:29 PM  Result Value Ref Range   Sodium, Ur 27 mmol/L    Comment: Performed at [facility], [address] [place], [place] [address]  Strep pneumoniae urinary antigen     Status: None   Collection Time: 01/13/23  9:29 PM  Result Value Ref Range   Strep Pneumo Urinary Antigen NEGATIVE NEGATIVE    Comment:        Infection due to S. pneumoniae cannot be absolutely ruled out since the antigen present may be below the detection limit of the test. Performed at [facility], [address] [place], [place] [address]   Blood Culture ID Panel (Reflexed)     Status: Abnormal   Collection Time: 01/13/23  9:29 PM  Result Value Ref Range   Enterococcus faecalis NOT DETECTED NOT DETECTED   Enterococcus Faecium NOT DETECTED NOT DETECTED   Listeria monocytogenes NOT DETECTED NOT DETECTED   Staphylococcus species NOT DETECTED NOT DETECTED   Staphylococcus aureus (BCID) NOT DETECTED NOT DETECTED   Staphylococcus epidermidis NOT DETECTED NOT DETECTED   Staphylococcus lugdunensis  NOT DETECTED NOT DETECTED   Streptococcus species NOT DETECTED NOT DETECTED   Streptococcus agalactiae NOT DETECTED NOT DETECTED   Streptococcus pneumoniae NOT DETECTED NOT DETECTED   Streptococcus pyogenes NOT DETECTED NOT DETECTED   A.calcoaceticus-baumannii NOT DETECTED NOT DETECTED   Bacteroides fragilis NOT DETECTED NOT DETECTED   Enterobacterales DETECTED (A) NOT DETECTED    Comment: Enterobacterales represent  a large order of gram negative bacteria, not a single organism. CRITICAL RESULT CALLED TO, READ BACK BY AND VERIFIED WITH: TREY GREENWOOD 01/14/23 1142 KLW    Enterobacter cloacae complex NOT DETECTED NOT DETECTED   Escherichia coli DETECTED (A) NOT DETECTED    Comment: CRITICAL RESULT CALLED TO, READ BACK BY AND VERIFIED WITH: TREY GREENWOOD 01/14/23 1142 KLW    Klebsiella aerogenes NOT DETECTED NOT DETECTED   Klebsiella oxytoca NOT DETECTED NOT DETECTED   Klebsiella pneumoniae NOT DETECTED NOT DETECTED   Proteus species NOT DETECTED NOT DETECTED   Salmonella species NOT DETECTED NOT DETECTED   Serratia marcescens NOT DETECTED NOT DETECTED   Haemophilus influenzae NOT DETECTED NOT DETECTED   Neisseria meningitidis NOT DETECTED NOT DETECTED   Pseudomonas aeruginosa NOT DETECTED NOT DETECTED   Stenotrophomonas maltophilia NOT DETECTED NOT DETECTED   Candida albicans NOT DETECTED NOT DETECTED   Candida auris NOT DETECTED NOT DETECTED   Candida glabrata NOT DETECTED NOT DETECTED   Candida krusei NOT DETECTED NOT DETECTED   Candida parapsilosis NOT DETECTED NOT DETECTED   Candida tropicalis NOT DETECTED NOT DETECTED   Cryptococcus neoformans/gattii NOT DETECTED NOT DETECTED   CTX-M ESBL NOT DETECTED NOT DETECTED   Carbapenem resistance IMP NOT DETECTED NOT DETECTED   Carbapenem resistance KPC NOT DETECTED NOT DETECTED   Carbapenem resistance NDM NOT DETECTED NOT DETECTED   Carbapenem resist OXA 48 LIKE NOT DETECTED NOT DETECTED   Carbapenem resistance VIM NOT DETECTED NOT DETECTED    Comment: Performed at [facility], [address] [place], [place] [address]  Blood gas, venous     Status: Abnormal   Collection Time: 01/13/23  9:30 PM  Result Value Ref Range   pH, Ven 7.5 (H) 7.25 - 7.43   pCO2, Ven 28 (L) 44 - 60 mmHg   pO2, Ven 57 (H) 32 - 45 mmHg   Bicarbonate 21.8 20.0 - 28.0 mmol/L   Acid-base deficit 0.2 0.0 - 2.0 mmol/L   O2 Saturation 92.1 %   Patient  temperature 37.0    Collection site VEIN     Comment: Performed at [facility], [address] [place], [place] [address]  Urinalysis, w/ Reflex to Culture (Infection Suspected) -Urine, Catheterized; In and out catheter     Status: Abnormal   Collection Time: 01/13/23  9:30 PM  Result Value Ref Range   Specimen Source URINE, CATHETERIZED    Color, Urine YELLOW (A) YELLOW   APPearance CLEAR (A) CLEAR   Specific Gravity, Urine 1.010 1.005 - 1.030   pH 6.0 5.0 - 8.0   Glucose, UA NEGATIVE NEGATIVE mg/dL   Hgb urine dipstick SMALL (A) NEGATIVE   Bilirubin Urine NEGATIVE NEGATIVE   Ketones, ur NEGATIVE NEGATIVE mg/dL   Protein, ur NEGATIVE NEGATIVE mg/dL   Nitrite NEGATIVE NEGATIVE   Leukocytes,Ua NEGATIVE NEGATIVE   RBC / HPF 0-5 0 - 5 RBC/hpf   WBC, UA 0-5 0 - 5 WBC/hpf    Comment:        Reflex urine culture not performed if WBC <=10, OR if Squamous epithelial cells >5. If Squamous  epithelial cells >5 suggest recollection.    Bacteria, UA NONE SEEN NONE SEEN   Squamous Epithelial / HPF 0 0 - 5 /HPF    Comment: Performed at [facility], [address] [place], [place] [address]  Lactic acid, plasma     Status: None   Collection Time: 01/13/23 11:06 PM  Result Value Ref Range   Lactic Acid, Venous 1.3 0.5 - 1.9 mmol/L    Comment: Performed at [facility], [address], [place], [place] [address]  Blood Culture (routine x 2)     Status: None (Preliminary result)   Collection Time: 01/13/23 11:06 PM   Specimen: BLOOD  Result Value Ref Range   Specimen Description BLOOD RIGHT ARM    Special Requests      BOTTLES DRAWN AEROBIC AND ANAEROBIC Blood Culture adequate volume   Culture      NO GROWTH 1 DAY Performed at [facility], [address], [place], [place] [address]    Report Status PENDING   Troponin I (High Sensitivity)     Status: None   Collection Time: 01/13/23 11:33 PM  Result Value Ref Range   Troponin I (High  Sensitivity) 14 <18 ng/L    Comment: (NOTE) Elevated high sensitivity troponin I (hsTnI) values and significant  changes across serial measurements may suggest ACS but many other  chronic and acute conditions are known to elevate hsTnI results.  Refer to the "Links" section for chest pain algorithms and additional  guidance. Performed at [facility], [address] [place], [place] [address]   Magnesium     Status: Abnormal   Collection Time: 01/13/23 11:33 PM  Result Value Ref Range   Magnesium 1.4 (L) 1.7 - 2.4 mg/dL    Comment: Performed at [facility], [address] [place], [place] [address]  Phosphorus     Status: Abnormal   Collection Time: 01/13/23 11:33 PM  Result Value Ref Range   Phosphorus 2.0 (L) 2.5 - 4.6 mg/dL    Comment: Performed at [facility], [address] [place], [place] [address]  Basic metabolic panel     Status: Abnormal   Collection Time: 01/13/23 11:33 PM  Result Value Ref Range   Sodium 128 (L) 135 - 145 mmol/L   Potassium 3.1 (L) 3.5 - 5.1 mmol/L   Chloride 99 98 - 111 mmol/L   CO2 21 (L) 22 - 32 mmol/L   Glucose, Bld 148 (H) 70 - 99 mg/dL    Comment: Glucose reference range applies only to samples taken after fasting for at least 8 hours.   BUN 11 8 - 23 mg/dL   Creatinine, Ser 5.78 0.61 - 1.24 mg/dL   Calcium 7.7 (L) 8.9 - 10.3 mg/dL   GFR, Estimated >46 >96 mL/min    Comment: (NOTE) Calculated using the CKD-EPI Creatinine Equation (2021)    Anion gap 8 5 - 15    Comment: Performed at [facility], [address] [place], [place] [address]  Basic metabolic panel     Status: Abnormal   Collection Time: 01/14/23  6:57 AM  Result Value Ref Range   Sodium 138 135 - 145 mmol/L    Comment: REPEATED TO VERIFY   SH   Potassium 4.6 3.5 - 5.1 mmol/L   Chloride 110 98 - 111 mmol/L   CO2 21 (L) 22 - 32 mmol/L   Glucose, Bld 189 (H) 70 - 99 mg/dL    Comment: Glucose reference  range applies only to  samples taken after fasting for at least 8 hours.   BUN 9 8 - 23 mg/dL   Creatinine, Ser 2.44 0.61 - 1.24 mg/dL   Calcium 7.7 (L) 8.9 - 10.3 mg/dL   GFR, Estimated >01 >02 mL/min    Comment: (NOTE) Calculated using the CKD-EPI Creatinine Equation (2021)    Anion gap 7 5 - 15    Comment: Performed at [facility] [address] [place], [place] [address]  CBC     Status: Abnormal   Collection Time: 01/14/23  6:57 AM  Result Value Ref Range   WBC 12.9 (H) 4.0 - 10.5 K/uL   RBC 4.24 4.22 - 5.81 MIL/uL   Hemoglobin 10.5 (L) 13.0 - 17.0 g/dL   HCT 64.4 (L) 03.4 - 74.2 %   MCV 74.8 (L) 80.0 - 100.0 fL   MCH 24.8 (L) 26.0 - 34.0 pg   MCHC 33.1 30.0 - 36.0 g/dL   RDW 59.5 (H) 63.8 - 75.6 %   Platelets 263 150 - 400 K/uL   nRBC 0.0 0.0 - 0.2 %    Comment: Performed at [facility], [address] [place], [place] [address]  Magnesium     Status: Abnormal   Collection Time: 01/14/23  6:57 AM  Result Value Ref Range   Magnesium 2.6 (H) 1.7 - 2.4 mg/dL    Comment: Performed at [facility], [address] [place], [place] [address]  Phosphorus     Status: None   Collection Time: 01/14/23  6:57 AM  Result Value Ref Range   Phosphorus 4.3 2.5 - 4.6 mg/dL    Comment: Performed at [facility], [address] [place], [place] [address]  TSH     Status: None   Collection Time: 01/14/23  6:57 AM  Result Value Ref Range   TSH 1.222 0.350 - 4.500 uIU/mL    Comment: Performed by a 3rd Generation assay with a functional sensitivity of <=0.01 uIU/mL. Performed at [facility], [address] [place], [place] [address]   Respiratory (~20 pathogens) panel by PCR     Status: None   Collection Time: 01/14/23 12:15 PM   Specimen: Nasopharyngeal Swab; Respiratory  Result Value Ref Range   Adenovirus NOT DETECTED NOT DETECTED   Coronavirus 229E NOT DETECTED NOT DETECTED    Comment: (NOTE) The Coronavirus on the Respiratory Panel, DOES NOT test  for the novel  Coronavirus (2019 nCoV)    Coronavirus HKU1 NOT DETECTED NOT DETECTED   Coronavirus NL63 NOT DETECTED NOT DETECTED   Coronavirus OC43 NOT DETECTED NOT DETECTED   Metapneumovirus NOT DETECTED NOT DETECTED   Rhinovirus / Enterovirus NOT DETECTED NOT DETECTED   Influenza A NOT DETECTED NOT DETECTED   Influenza B NOT DETECTED NOT DETECTED   Parainfluenza Virus 1 NOT DETECTED NOT DETECTED   Parainfluenza Virus 2 NOT DETECTED NOT DETECTED   Parainfluenza Virus 3 NOT DETECTED NOT DETECTED   Parainfluenza Virus 4 NOT DETECTED NOT DETECTED   Respiratory Syncytial Virus NOT DETECTED NOT DETECTED   Bordetella pertussis NOT DETECTED NOT DETECTED   Bordetella Parapertussis NOT DETECTED NOT DETECTED   Chlamydophila pneumoniae NOT DETECTED NOT DETECTED   Mycoplasma pneumoniae NOT DETECTED NOT DETECTED    Comment: Performed at [facility], [address], [place], [place] [address]  Basic metabolic panel     Status: Abnormal   Collection Time: 01/15/23  4:47 AM  Result Value Ref Range  Sodium 133 (L) 135 - 145 mmol/L   Potassium 4.1 3.5 - 5.1 mmol/L   Chloride 104 98 - 111 mmol/L   CO2 21 (L) 22 - 32 mmol/L   Glucose, Bld 99 70 - 99 mg/dL    Comment: Glucose reference range applies only to samples taken after fasting for at least 8 hours.   BUN 10 8 - 23 mg/dL   Creatinine, Ser 4.09 0.61 - 1.24 mg/dL   Calcium 7.9 (L) 8.9 - 10.3 mg/dL   GFR, Estimated >81 >19 mL/min    Comment: (NOTE) Calculated using the CKD-EPI Creatinine Equation (2021)    Anion gap 8 5 - 15    Comment: Performed at [facility], [address] [place], [place] [address]  Magnesium     Status: None   Collection Time: 01/15/23  4:47 AM  Result Value Ref Range   Magnesium 2.0 1.7 - 2.4 mg/dL    Comment: Performed at [facility], [address] [place], [place] [address]  Phosphorus     Status: None   Collection Time: 01/15/23  4:47 AM  Result Value Ref Range   Phosphorus  2.5 2.5 - 4.6 mg/dL    Comment: Performed at [facility], [address] [place], [place] [address]    Current Facility-Administered Medications  Medication Dose Route Frequency Provider Last Rate Last Admin   acetaminophen (TYLENOL) tablet 650 mg  650 mg Oral Q6H PRN Lorretta Harp, MD       albuterol (PROVENTIL) (2.5 MG/3ML) 0.083% nebulizer solution 2.5 mg  2.5 mg Nebulization Q4H PRN Lorretta Harp, MD       benztropine (COGENTIN) tablet 1 mg  1 mg Oral Daily Lorretta Harp, MD   1 mg at 01/15/23 0857   cefTRIAXone (ROCEPHIN) 2 g in sodium chloride 0.9 % 100 mL IVPB  2 g Intravenous Daily Kathrynn Running, MD   Stopped at 01/14/23 1403   dextromethorphan-guaiFENesin (MUCINEX DM) 30-600 MG per 12 hr tablet 1 tablet  1 tablet Oral BID PRN Lorretta Harp, MD       diphenhydrAMINE (BENADRYL) injection 12.5 mg  12.5 mg Intravenous Q8H PRN Lorretta Harp, MD       enoxaparin (LOVENOX) injection 40 mg  40 mg Subcutaneous Q24H Kathrynn Running, MD   40 mg at 01/14/23 2312   feeding supplement (ENSURE ENLIVE / ENSURE PLUS) liquid 237 mL  237 mL Oral BID BM Lorretta Harp, MD   237 mL at 01/15/23 0857   haloperidol (HALDOL) tablet 10 mg  10 mg Oral BID Kathrynn Running, MD   10 mg at 01/15/23 0857   hydrOXYzine (ATARAX) tablet 50 mg  50 mg Oral BID PRN Lorretta Harp, MD   50 mg at 01/15/23 0857   magnesium hydroxide (MILK OF MAGNESIA) suspension 30 mL  30 mL Oral Daily PRN Lorretta Harp, MD       midodrine (PROAMATINE) tablet 10 mg  10 mg Oral TID WC Lorretta Harp, MD   10 mg at 01/15/23 0857   mometasone-formoterol (DULERA) 100-5 MCG/ACT inhaler 2 puff  2 puff Inhalation BID Kathrynn Running, MD   2 puff at 01/15/23 0857   montelukast (SINGULAIR) tablet 10 mg  10 mg Oral Daily Lorretta Harp, MD   10 mg at 01/15/23 0856   multivitamin with minerals tablet 1 tablet  1 tablet Oral Daily Lorretta Harp, MD   1 tablet at 01/15/23 0856   nicotine (NICODERM CQ - dosed in mg/24  hours) patch 21 mg  21 mg Transdermal Daily Lorretta Harp, MD   21 mg at 01/15/23 0900   oxyCODONE-acetaminophen (PERCOCET/ROXICET) 5-325 MG per tablet 1 tablet  1 tablet Oral Q4H PRN Lorretta Harp, MD   1 tablet at 01/15/23 0857   pantoprazole (PROTONIX) EC tablet 40 mg  40 mg Oral Daily Lorretta Harp, MD   40 mg at 01/15/23 0857   tamsulosin (FLOMAX) capsule 0.4 mg  0.4 mg Oral QPC supper Kathrynn Running, MD   0.4 mg at 01/14/23 1847   traZODone (DESYREL) tablet 300 mg  300 mg Oral QHS Kathrynn Running, MD   300 mg at 01/14/23 2309    Musculoskeletal: Strength & Muscle Tone: within normal limits Gait & Station:  Unknown Patient leans: N/A            Psychiatric Specialty Exam:  Presentation  General Appearance: No data recorded Eye Contact:No data recorded Speech:No data recorded Speech Volume:No data recorded Handedness:No data recorded  Mood and Affect  Mood:No data recorded Affect:No data recorded  Thought Process  Thought Processes:No data recorded Descriptions of Associations:No data recorded Orientation:No data recorded Thought Content:No data recorded History of Schizophrenia/Schizoaffective disorder:No data recorded Duration of Psychotic Symptoms:No data recorded Hallucinations:No data recorded Ideas of Reference:No data recorded Suicidal Thoughts:No data recorded Homicidal Thoughts:No data recorded  Sensorium  Memory:No data recorded Judgment:No data recorded Insight:No data recorded  Executive Functions  Concentration:No data recorded Attention Span:No data recorded Recall:No data recorded Fund of Knowledge:No data recorded Language:No data recorded  Psychomotor Activity  Psychomotor Activity:No data recorded  Assets  Assets:No data recorded  Sleep  Sleep:No data recorded  Physical Exam: Physical Exam Vitals and nursing note reviewed.  Constitutional:      Appearance: Normal appearance. He is normal weight.  Neurological:     General: No focal deficit present.     Mental Status: He  is alert and oriented to person, place, and time.  Psychiatric:        Attention and Perception: Attention and perception normal.        Mood and Affect: Mood and affect normal.        Speech: Speech is delayed.        Behavior: Behavior normal. Behavior is cooperative.        Thought Content: Thought content normal.        Cognition and Memory: Cognition is impaired.        Judgment: Judgment normal.    Review of Systems  Psychiatric/Behavioral:  Positive for memory loss.    Blood pressure 129/76, pulse (!) 119, temperature 97.7 F (36.5 C), temperature source Oral, resp. rate 17, weight 78.9 kg, SpO2 96%. Body mass index is 25.7 kg/m.  Treatment Plan Summary: Observation   Disposition: Patient does not meet criteria for psychiatric inpatient admission.  Sarina Ill, DO 01/15/2023 11:19 AM

## 2023-01-15 NOTE — ED Notes (Signed)
Pt called out stating he needed to be changed. Pt brief changed and pericare preformed.

## 2023-01-15 NOTE — Progress Notes (Addendum)
PROGRESS NOTE    Adrian Gillespie  EXB:[id number] DOB: 01-26-1951 DOA: 01/13/2023 PCP: Andres Shad, MD     Brief Narrative:   From admission h and p  Adrian Gillespie is a 72 y.o. male with medical history significant of HTN, HLD, COPD, asthma, GERD, depression, anxiety, schizophrenia, HBV, tobacco abuse, who presents with AMS, fever and fall.   Per report, pt fell twice in SN[facility] today. It is unclear whether he hit his head or lost consciousness. He report right shoulder pain. Pt was noted to be more confused than usual. When EMS arrived he was found to be febrile and tachycardic.  His temperature is 102.2 in ED.  Patient received 2 L LR bolus in ED.  His mental status has gradually improved.  When I saw patient in ED, he is alert, knows his own name, knows that he is in hospital, confused about time.  He moves all extremities normally.  Answered most of questions appropriately.  He reports dry cough, denies chest pain, nausea, vomiting, diarrhea or abdominal pain.  No symptoms of UTI.  He denies shortness  of breath, but was found to have oxygen desaturation to 86% on room air, which improved to 97% on 2 L oxygen.  Patient states that he was on oxygen, but does not remember how much oxygen he was using at home.  Patient does not have neck pain, no neck rigidity on my examination.   Assessment & Plan:   Principal Problem:   SIRS (systemic inflammatory response syndrome) (HCC) Active Problems:   Fall at home, initial encounter   Left shoulder pain   Chronic obstructive pulmonary disease (HCC)   AF (paroxysmal atrial fibrillation) (HCC)   Hypokalemia   Hyponatremia   Electrolyte disturbance   Hypophosphatemia   Hypomagnesemia   HTN (hypertension)   Prolonged QT interval   Acute metabolic encephalopathy   Schizophrenia (HCC)   Hypotension  # Fever # E coli bacteremia Etiology of bacteremia is unclear. Urinalysis not suggestive of infection and culture is negative. No abdominal  pain or diarrhea to suggest intra-abdominal pathology though patient unable to provide accurate history.  - monitor blood cultures, repeat cultures ordered - continue ceftriaxone - ID consulted  # Seizure concern Report of fall day prior to admission, shaking, and confusion afterward that has now resolved. Discussed w/ neuro, hard to tell what's going on here. They advised mri (nothing acute) and eeg (no seizure-like activity).  - clinical monitoring  # Frequent falls Likely multifactorial - PT consult - treating conditions as below  # Hyponatremia Appears to be hypovolemic as has improved nicely with hydration, to 133 today. Tsh wnl - continue fluids for another day  # Hypokalemia # hypomagnesmia - replete and monitor  # QTC prolongation Likely 2/2 psych meds exacerbated by electrolyte abnormalities, has improved on most recent EKG - treat electrolyte abnormalities as above  # COPD Quiescent - home inhalers  # Hypotension Likely multifactorial. Meds likely contributing. On midodrine per pcp - continue midodrine  # Schizophrenia Ck wnl. Agitated this morning resolved. Psych has seen, doesn't advise changing meds - continue home haldol - on monthly invega as well - trazodone at bedtime  # Chronic pain Somnolent today after home oxy given - hold that    DVT prophylaxis: lovenox Code Status: full Family Communication: sister updated telephonically 12/19  Level of care: Med-Surg Status is: Inpatient Remains inpatient appropriate because: need for inpatient w/u    Consultants:  psych  Procedures: none  Antimicrobials:  ceftriaxone   Subjective: Somnolent, no complaints  Objective: Vitals:   01/15/23 0445 01/15/23 0448 01/15/23 0557 01/15/23 0754  BP:   (!) 119/90 129/76  Pulse: 94  (!) 107 (!) 119  Resp: (!) 21  18 17   Temp:  100 F (37.8 C) 99.2 F (37.3 C) 97.7 F (36.5 C)  TempSrc:  Oral Oral Oral  SpO2: 97%  91% 96%  Weight:       No  intake or output data in the 24 hours ending 01/15/23 1413  Filed Weights   01/13/23 2200  Weight: 78.9 kg    Examination:  General exam: Appears calm and comfortable, disheveled, somnolent Respiratory system: Clear to auscultation. Respiratory effort normal. Cardiovascular system: S1 & S2 heard, RRR.   Gastrointestinal system: Abdomen is nondistended, soft and nontender.   Central nervous system: Alert and oriented to self. No focal neurological deficits. Extremities: Symmetric 5 x 5 power. Skin: No rashes, lesions or ulcers Psychiatry: confused, calm    Data Reviewed: I have personally reviewed following labs and imaging studies  CBC: Recent Labs  Lab 01/13/23 2129 01/14/23 0657  WBC 12.7* 12.9*  NEUTROABS 11.6*  --   HGB 10.2* 10.5*  HCT 30.4* 31.7*  MCV 73.4* 74.8*  PLT 284 263   Basic Metabolic Panel: Recent Labs  Lab 01/13/23 2128 01/13/23 2129 01/13/23 2333 01/14/23 0657 01/15/23 0447  NA  --  127* 128* 138 133*  K  --  3.4* 3.1* 4.6 4.1  CL  --  98 99 110 104  CO2  --  20* 21* 21* 21*  GLUCOSE  --  126* 148* 189* 99  BUN  --  11 11 9 10   CREATININE  --  0.90 0.93 0.79 0.75  CALCIUM  --  7.8* 7.7* 7.7* 7.9*  MG 1.5*  --  1.4* 2.6* 2.0  PHOS  --   --  2.0* 4.3 2.5   GFR: Estimated Creatinine Clearance: 84.7 mL/min (by C-G formula based on SCr of 0.75 mg/dL). Liver Function Tests: Recent Labs  Lab 01/13/23 2129  AST 22  ALT 18  ALKPHOS 87  BILITOT 1.4*  PROT 6.3*  ALBUMIN 2.4*   No results for input(s): "LIPASE", "AMYLASE" in the last 168 hours. No results for input(s): "AMMONIA" in the last 168 hours. Coagulation Profile: Recent Labs  Lab 01/13/23 2129  INR 1.3*   Cardiac Enzymes: Recent Labs  Lab 01/13/23 2128  CKTOTAL 33*   BNP (last 3 results) No results for input(s): "PROBNP" in the last 8760 hours. HbA1C: No results for input(s): "HGBA1C" in the last 72 hours. CBG: No results for input(s): "GLUCAP" in the last 168  hours. Lipid Profile: No results for input(s): "CHOL", "HDL", "LDLCALC", "TRIG", "CHOLHDL", "LDLDIRECT" in the last 72 hours. Thyroid Function Tests: Recent Labs    01/14/23 0657  TSH 1.222   Anemia Panel: No results for input(s): "VITAMINB12", "FOLATE", "FERRITIN", "TIBC", "IRON", "RETICCTPCT" in the last 72 hours. Urine analysis:    Component Value Date/Time   COLORURINE YELLOW (A) 01/13/2023 2130   APPEARANCEUR CLEAR (A) 01/13/2023 2130   LABSPEC 1.010 01/13/2023 2130   PHURINE 6.0 01/13/2023 2130   GLUCOSEU NEGATIVE 01/13/2023 2130   HGBUR SMALL (A) 01/13/2023 2130   BILIRUBINUR NEGATIVE 01/13/2023 2130   KETONESUR NEGATIVE 01/13/2023 2130   PROTEINUR NEGATIVE 01/13/2023 2130   NITRITE NEGATIVE 01/13/2023 2130   LEUKOCYTESUR NEGATIVE 01/13/2023 2130   Sepsis Labs: @LABRCNTIP (procalcitonin:4,lacticidven:4)  ) Recent Results (from the past 240 hours)  Resp panel by RT-PCR (RSV, Flu A&B, Covid) Anterior Nasal Swab     Status: None   Collection Time: 01/13/23  9:29 PM   Specimen: Anterior Nasal Swab  Result Value Ref Range Status   SARS Coronavirus 2 by RT PCR NEGATIVE NEGATIVE Final    Comment: (NOTE) SARS-CoV-2 target nucleic acids are NOT DETECTED.  The SARS-CoV-2 RNA is generally detectable in upper respiratory specimens during the acute phase of infection. The lowest concentration of SARS-CoV-2 viral copies this assay can detect is 138 copies/mL. A negative result does not preclude SARS-Cov-2 infection and should not be used as the sole basis for treatment or other patient management decisions. A negative result may occur with  improper specimen collection/handling, submission of specimen other than nasopharyngeal swab, presence of viral mutation(s) within the areas targeted by this assay, and inadequate number of viral copies(<138 copies/mL). A negative result must be combined with clinical observations, patient history, and epidemiological information. The  expected result is Negative.  Fact Sheet for Patients:  BloggerCourse.com  Fact Sheet for Healthcare Providers:  SeriousBroker.it  This test is no t yet approved or cleared by the [place] FDA and  has been authorized for detection and/or diagnosis of SARS-CoV-2 by FDA under an Emergency Use Authorization (EUA). This EUA will remain  in effect (meaning this test can be used) for the duration of the COVID-19 declaration under Section 564(b)(1) of the Act, 21 U.S.C.section 360bbb-3(b)(1), unless the authorization is terminated  or revoked sooner.       Influenza A by PCR NEGATIVE NEGATIVE Final   Influenza B by PCR NEGATIVE NEGATIVE Final    Comment: (NOTE) The Xpert Xpress SARS-CoV-2/FLU/RSV plus assay is intended as an aid in the diagnosis of influenza from Nasopharyngeal swab specimens and should not be used as a sole basis for treatment. Nasal washings and aspirates are unacceptable for Xpert Xpress SARS-CoV-2/FLU/RSV testing.  Fact Sheet for Patients: BloggerCourse.com  Fact Sheet for Healthcare Providers: SeriousBroker.it  This test is not yet approved or cleared by the [place] FDA and has been authorized for detection and/or diagnosis of SARS-CoV-2 by FDA under an Emergency Use Authorization (EUA). This EUA will remain in effect (meaning this test can be used) for the duration of the COVID-19 declaration under Section 564(b)(1) of the Act, 21 U.S.C. section 360bbb-3(b)(1), unless the authorization is terminated or revoked.     Resp Syncytial Virus by PCR NEGATIVE NEGATIVE Final    Comment: (NOTE) Fact Sheet for Patients: BloggerCourse.com  Fact Sheet for Healthcare Providers: SeriousBroker.it  This test is not yet approved or cleared by the [place] FDA and has been authorized for detection and/or  diagnosis of SARS-CoV-2 by FDA under an Emergency Use Authorization (EUA). This EUA will remain in effect (meaning this test can be used) for the duration of the COVID-19 declaration under Section 564(b)(1) of the Act, 21 U.S.C. section 360bbb-3(b)(1), unless the authorization is terminated or revoked.  Performed at [facility], [address] [place], [place] [address]   Blood Culture (routine x 2)     Status: Abnormal (Preliminary result)   Collection Time: 01/13/23  9:29 PM   Specimen: BLOOD  Result Value Ref Range Status   Specimen Description   Final    BLOOD BLOOD LEFT ARM Performed at [facility], [address] [place], [place] [address]    Special Requests   Final    BOTTLES DRAWN AEROBIC AND ANAEROBIC Blood Culture adequate volume Performed at [facility]  [facility], [address], [place], [place] [address]    Culture  Setup Time   Final    Organism ID to follow ANAEROBIC BOTTLE ONLY GRAM NEGATIVE RODS CRITICAL RESULT CALLED TO, READ BACK BY AND VERIFIED WITH: TREY GREENWOOD 01/14/23 1142 KLW Performed at [facility], [address] [place], [place] [address]    Culture (A)  Final    ESCHERICHIA COLI SUSCEPTIBILITIES TO FOLLOW Performed at [facility], [address] [place], [place] [address]    Report Status PENDING  Incomplete  Urine Culture     Status: None   Collection Time: 01/13/23  9:29 PM   Specimen: Urine, Clean Catch  Result Value Ref Range Status   Specimen Description   Final    URINE, CLEAN CATCH Performed at [facility], [address] [place], [place] [address]    Special Requests   Final    NONE Performed at [facility], [address], [place], [place] [address]    Culture   Final    NO GROWTH Performed at [facility], [address] [place], [place] [address]    Report Status 01/15/2023 FINAL  Final  Blood Culture ID Panel (Reflexed)     Status: Abnormal    Collection Time: 01/13/23  9:29 PM  Result Value Ref Range Status   Enterococcus faecalis NOT DETECTED NOT DETECTED Final   Enterococcus Faecium NOT DETECTED NOT DETECTED Final   Listeria monocytogenes NOT DETECTED NOT DETECTED Final   Staphylococcus species NOT DETECTED NOT DETECTED Final   Staphylococcus aureus (BCID) NOT DETECTED NOT DETECTED Final   Staphylococcus epidermidis NOT DETECTED NOT DETECTED Final   Staphylococcus lugdunensis NOT DETECTED NOT DETECTED Final   Streptococcus species NOT DETECTED NOT DETECTED Final   Streptococcus agalactiae NOT DETECTED NOT DETECTED Final   Streptococcus pneumoniae NOT DETECTED NOT DETECTED Final   Streptococcus pyogenes NOT DETECTED NOT DETECTED Final   A.calcoaceticus-baumannii NOT DETECTED NOT DETECTED Final   Bacteroides fragilis NOT DETECTED NOT DETECTED Final   Enterobacterales DETECTED (A) NOT DETECTED Final    Comment: Enterobacterales represent a large order of gram negative bacteria, not a single organism. CRITICAL RESULT CALLED TO, READ BACK BY AND VERIFIED WITH: TREY GREENWOOD 01/14/23 1142 KLW    Enterobacter cloacae complex NOT DETECTED NOT DETECTED Final   Escherichia coli DETECTED (A) NOT DETECTED Final    Comment: CRITICAL RESULT CALLED TO, READ BACK BY AND VERIFIED WITH: TREY GREENWOOD 01/14/23 1142 KLW    Klebsiella aerogenes NOT DETECTED NOT DETECTED Final   Klebsiella oxytoca NOT DETECTED NOT DETECTED Final   Klebsiella pneumoniae NOT DETECTED NOT DETECTED Final   Proteus species NOT DETECTED NOT DETECTED Final   Salmonella species NOT DETECTED NOT DETECTED Final   Serratia marcescens NOT DETECTED NOT DETECTED Final   Haemophilus influenzae NOT DETECTED NOT DETECTED Final   Neisseria meningitidis NOT DETECTED NOT DETECTED Final   Pseudomonas aeruginosa NOT DETECTED NOT DETECTED Final   Stenotrophomonas maltophilia NOT DETECTED NOT DETECTED Final   Candida albicans NOT DETECTED NOT DETECTED Final   Candida auris NOT  DETECTED NOT DETECTED Final   Candida glabrata NOT DETECTED NOT DETECTED Final   Candida krusei NOT DETECTED NOT DETECTED Final   Candida parapsilosis NOT DETECTED NOT DETECTED Final   Candida tropicalis NOT DETECTED NOT DETECTED Final   Cryptococcus neoformans/gattii NOT DETECTED NOT DETECTED Final   CTX-M ESBL NOT DETECTED NOT DETECTED Final   Carbapenem resistance IMP NOT  DETECTED NOT DETECTED Final   Carbapenem resistance KPC NOT DETECTED NOT DETECTED Final   Carbapenem resistance NDM NOT DETECTED NOT DETECTED Final   Carbapenem resist OXA 48 LIKE NOT DETECTED NOT DETECTED Final   Carbapenem resistance VIM NOT DETECTED NOT DETECTED Final    Comment: Performed at [facility], [address] [place], [place] [address]  Blood Culture (routine x 2)     Status: None (Preliminary result)   Collection Time: 01/13/23 11:06 PM   Specimen: BLOOD  Result Value Ref Range Status   Specimen Description BLOOD RIGHT ARM  Final   Special Requests   Final    BOTTLES DRAWN AEROBIC AND ANAEROBIC Blood Culture adequate volume   Culture   Final    NO GROWTH 1 DAY Performed at [facility], [address], [place], [place] [address]    Report Status PENDING  Incomplete  Respiratory (~20 pathogens) panel by PCR     Status: None   Collection Time: 01/14/23 12:15 PM   Specimen: Nasopharyngeal Swab; Respiratory  Result Value Ref Range Status   Adenovirus NOT DETECTED NOT DETECTED Final   Coronavirus 229E NOT DETECTED NOT DETECTED Final    Comment: (NOTE) The Coronavirus on the Respiratory Panel, DOES NOT test for the novel  Coronavirus (2019 nCoV)    Coronavirus HKU1 NOT DETECTED NOT DETECTED Final   Coronavirus NL63 NOT DETECTED NOT DETECTED Final   Coronavirus OC43 NOT DETECTED NOT DETECTED Final   Metapneumovirus NOT DETECTED NOT DETECTED Final   Rhinovirus / Enterovirus NOT DETECTED NOT DETECTED Final   Influenza A NOT DETECTED NOT DETECTED Final   Influenza B NOT DETECTED  NOT DETECTED Final   Parainfluenza Virus 1 NOT DETECTED NOT DETECTED Final   Parainfluenza Virus 2 NOT DETECTED NOT DETECTED Final   Parainfluenza Virus 3 NOT DETECTED NOT DETECTED Final   Parainfluenza Virus 4 NOT DETECTED NOT DETECTED Final   Respiratory Syncytial Virus NOT DETECTED NOT DETECTED Final   Bordetella pertussis NOT DETECTED NOT DETECTED Final   Bordetella Parapertussis NOT DETECTED NOT DETECTED Final   Chlamydophila pneumoniae NOT DETECTED NOT DETECTED Final   Mycoplasma pneumoniae NOT DETECTED NOT DETECTED Final    Comment: Performed at [facility], [address] [place], [place] [address]         Radiology Studies: EEG adult Result Date: 01/15/2023 Charlsie Quest, MD     01/15/2023  4:58 AM Patient Name: Adrian Gillespie MRN: [id number] Epilepsy Attending: Charlsie Quest Referring Physician/Provider: Kathrynn Running, MD Date: 01/14/2023 Duration: 31.06 mins Patient history: 72yo M with ams getting eeg to evaluate for seizure Level of alertness: Awake, asleep AEDs during EEG study: None Technical aspects: This EEG study was done with scalp electrodes positioned according to the 10-20 International system of electrode placement. Electrical activity was reviewed with band pass filter of 1-70Hz , sensitivity of 7 uV/mm, display speed of 64mm/sec with a 60Hz  notched filter applied as appropriate. EEG data were recorded continuously and digitally stored.  Video monitoring was available and reviewed as appropriate. Description: No clear posterior dominant rhythm was seen. Sleep was characterized by sleep spindles (12 to 14 Hz), maximal frontocentral region. EEG showed continuous generalized predominantly 5  to 7 Hz theta slowing admixed with intermittent generalized 2-3Hz  delta slowing. Hyperventilation and photic stimulation were not performed.   ABNORMALITY - Continuous slow, generalized IMPRESSION: This study is suggestive of moderate diffuse encephalopathy. No seizures  or epileptiform discharges were seen throughout the recording.  Charlsie Quest   MR BRAIN W WO CONTRAST Result Date: 01/14/2023 CLINICAL DATA:  Seizure, new-onset, no history of trauma seizure concern EXAM: MRI HEAD WITHOUT AND WITH CONTRAST TECHNIQUE: Multiplanar, multiecho pulse sequences of the brain and surrounding structures were obtained without and with intravenous contrast. CONTRAST:  7mL GADAVIST GADOBUTROL 1 MMOL/ML IV SOLN COMPARISON:  CT head 01/13/2023. FINDINGS: Brain: No acute infarction, hemorrhage, hydrocephalus, extra-axial collection or mass lesion. Cerebral atrophy. No pathologic enhancement. Vascular: Major arterial flow voids are maintained at the skull base. Skull and upper cervical spine: Normal marrow signal. Sinuses/Orbits: Negative. Other: No mastoid effusions. IMPRESSION: No evidence of acute intracranial abnormality. Electronically Signed   By: Feliberto Harts M.D.   On: 01/14/2023 11:25   DG Shoulder Left Result Date: 01/14/2023 CLINICAL DATA:  Left shoulder pain after fall EXAM: LEFT SHOULDER - 2+ VIEW COMPARISON:  Left shoulder x-ray 08/15/2017 FINDINGS: There is no evidence of fracture or dislocation. There is no evidence of arthropathy or other focal bone abnormality. Soft tissues are unremarkable. IMPRESSION: Negative. Electronically Signed   By: Darliss Cheney M.D.   On: 01/14/2023 03:20   CT Cervical Spine Wo Contrast Result Date: 01/13/2023 CLINICAL DATA:  Trauma fall EXAM: CT CERVICAL SPINE WITHOUT CONTRAST TECHNIQUE: Multidetector CT imaging of the cervical spine was performed without intravenous contrast. Multiplanar CT image reconstructions were also generated. RADIATION DOSE REDUCTION: This exam was performed according to the departmental dose-optimization program which includes automated exposure control, adjustment of the mA and/or kV according to patient size and/or use of iterative reconstruction technique. COMPARISON:  CT 08/22/2022 FINDINGS: Alignment:  Mild reversal of cervical lordosis. Trace anterolisthesis C3 on C4 and C4 on C5. Facet alignment is within normal limits. Skull base and vertebrae: No acute fracture. No primary bone lesion or focal pathologic process. Soft tissues and spinal canal: No prevertebral fluid or swelling. No visible canal hematoma. Disc levels: Multilevel degenerative changes. Partial ankylosis C5-C6. Mild disc space narrowing C3-C4 with moderate disc space narrowing C5-C6 and C6-C7. Facet degenerative changes at multiple levels. Upper chest: Emphysema Other: None IMPRESSION: Reversal of cervical lordosis. No acute osseous abnormality. Multilevel degenerative changes. Electronically Signed   By: Jasmine Pang M.D.   On: 01/13/2023 22:18   CT Head Wo Contrast Result Date: 01/13/2023 CLINICAL DATA:  Head trauma EXAM: CT HEAD WITHOUT CONTRAST TECHNIQUE: Contiguous axial images were obtained from the base of the skull through the vertex without intravenous contrast. RADIATION DOSE REDUCTION: This exam was performed according to the departmental dose-optimization program which includes automated exposure control, adjustment of the mA and/or kV according to patient size and/or use of iterative reconstruction technique. COMPARISON:  CT brain 08/22/2022 FINDINGS: Brain: No acute territorial infarction, hemorrhage or intracranial mass. Mild atrophy. Stable ventricle size Vascular: No hyperdense vessels. 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 No unexpected calcification Skull: Normal. Negative for fracture or focal lesion. Sinuses/Orbits: No acute finding. Other: None IMPRESSION: No CT evidence for acute intracranial abnormality. Mild atrophy. Electronically Signed   By: Jasmine Pang M.D.   On: 01/13/2023 22:13   DG Chest Port 1 View Result Date: 01/13/2023 CLINICAL DATA:  Altered mental status EXAM: PORTABLE CHEST 1 VIEW COMPARISON:  09/19/2022 FINDINGS: No acute airspace disease or effusion. Upper normal cardiac size.  Mild central vascular congestion. Possible small left effusion. No pneumothorax IMPRESSION: Mild central vascular congestion. Possible small left effusion. Electronically Signed   By: Jasmine Pang M.D.   On: 01/13/2023 22:11  Scheduled Meds:  benztropine  1 mg Oral Daily   enoxaparin (LOVENOX) injection  40 mg Subcutaneous Q24H   feeding supplement  237 mL Oral BID BM   haloperidol  10 mg Oral BID   midodrine  10 mg Oral TID WC   mometasone-formoterol  2 puff Inhalation BID   montelukast  10 mg Oral Daily   multivitamin with minerals  1 tablet Oral Daily   nicotine  21 mg Transdermal Daily   pantoprazole  40 mg Oral Daily   tamsulosin  0.4 mg Oral QPC supper   traZODone  300 mg Oral QHS   Continuous Infusions:  cefTRIAXone (ROCEPHIN)  IV 2 g (01/15/23 1202)     LOS: 2 days     Silvano Bilis, MD [facility]   If 7PM-7AM, please contact night-coverage [email or web address] Password [email or web address] 01/15/2023, 2:13 PM

## 2023-01-15 NOTE — TOC Initial Note (Addendum)
Transition of Care [facility]) - Initial/Assessment Note    Patient Details  Name: Adrian Gillespie MRN: [id number] Date of Birth: 29-Jun-1950  Transition of Care [facility]) CM/SW Contact:    Margarito Liner, LCSW Phone Number: 01/15/2023, 9:34 AM  Clinical Narrative:  Per chart review, patient is a resident from [address] CSW called and spoke to administrator, Amalia Hailey, who confirmed he lives on their ALF side. Patient is active with [facility]. Liaison is checking to see which services he is active with. Amalia Hailey stated patient uses a wheelchair at the facility. No further concerns. CSW will continue to follow patient for support and facilitate return to ALF once medically stable.                9:59 am: Patient is active with [facility] for PT only.  Expected Discharge Plan: Assisted Living (with home health) Barriers to Discharge: Continued Medical Work up   Patient Goals and CMS Choice Patient states their goals for this hospitalization and ongoing recovery are:: Patient not fully oriented.   Choice offered to / list presented to : NA      Expected Discharge Plan and Services       Living arrangements for the past 2 months: Assisted Living Facility                             [facility] Agency: [facility] Date [facility] Agency Contacted: 01/15/23   Representative spoke with at [facility] Agency: Unknown [place]  Prior Living Arrangements/Services Living arrangements for the past 2 months: Assisted Living Facility Lives with:: Facility Resident Patient language and need for interpreter reviewed:: Yes        Need for Family Participation in Patient Care: Yes (Comment) Care giver support system in place?: Yes (comment) Current home services: DME Criminal Activity/Legal Involvement Pertinent to Current Situation/Hospitalization: No - Comment as needed  Activities of Daily Living   ADL Screening (condition at time of admission) Independently performs ADLs?: No Does the  patient have a NEW difficulty with bathing/dressing/toileting/self-feeding that is expected to last >3 days?: Yes (Initiates electronic notice to provider for possible OT consult) Does the patient have a NEW difficulty with getting in/out of bed, walking, or climbing stairs that is expected to last >3 days?: Yes (Initiates electronic notice to provider for possible PT consult) Does the patient have a NEW difficulty with communication that is expected to last >3 days?: No Is the patient deaf or have difficulty hearing?: No Does the patient have difficulty seeing, even when wearing glasses/contacts?: No Does the patient have difficulty concentrating, remembering, or making decisions?: Yes  Permission Sought/Granted                  Emotional Assessment       Orientation: : Oriented to Self Alcohol / Substance Use: Not Applicable Psych Involvement: No (comment)  Admission diagnosis:  SIRS (systemic inflammatory response syndrome) (HCC) [R65.10] Sepsis (HCC) [A41.9] Sepsis without acute organ dysfunction, due to unspecified organism [facility]) [A41.9] Patient Active Problem List   Diagnosis Date Noted   Electrolyte disturbance 01/14/2023   Hypophosphatemia 01/14/2023   Acute metabolic encephalopathy 01/14/2023   Left shoulder pain 01/14/2023   Hypomagnesemia 01/14/2023   Hypotension 01/14/2023   SIRS (systemic inflammatory response syndrome) (HCC) 01/13/2023   Fall at home, initial encounter 01/13/2023   Hyponatremia 01/13/2023   Prolonged QT interval 01/13/2023   Closed right hip fracture (HCC) 09/19/2022  Depression 09/19/2022   GERD without esophagitis 09/19/2022   Fall 09/19/2022   Closed displaced intertrochanteric fracture of right femur (HCC) 09/19/2022   Right hip pain 09/19/2022   Lethargy    Acquired thrombophilia (HCC)    Intractable pain 02/02/2020   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   Femur fracture, left (HCC) 02/02/2020   AF (paroxysmal atrial fibrillation) (HCC)  02/02/2020   Malnutrition of moderate degree 05/26/2019   Acute respiratory failure with hypoxemia (HCC) 05/26/2019   CAP (community acquired pneumonia) 04/20/2019   PVC (premature ventricular contraction) 04/20/2019   GERD (gastroesophageal reflux disease) 04/20/2019   Acute on chronic respiratory failure with hypoxia (HCC)    HTN (hypertension)    Chronic obstructive pulmonary disease (HCC)    NSVT (nonsustained ventricular tachycardia) (HCC)    Acute respiratory failure with hypoxia (HCC) 04/02/2019   Hypokalemia 04/02/2019   Schizophrenia (HCC) 04/02/2019   PCP:  Andres Shad, MD Pharmacy:   [facility] - [place], [place] - [address] [place] [address] Phone: [phone] Fax: [phone]     Social Drivers of Health (SDOH) Social History: SDOH Screenings   Food Insecurity: No Food Insecurity (01/15/2023)  Housing: Low Risk  (01/15/2023)  Transportation Needs: No Transportation Needs (01/15/2023)  Utilities: Not At Risk (01/15/2023)  Financial Resource Strain: Medium Risk (06/26/2022)   Received from [facility], [facility]  Physical Activity: Sufficiently Active (06/26/2022)   Received from [facility], [facility]  Social Connections: Moderately Isolated (07/18/2020)   Received from [facility], [facility]  Stress: No Stress Concern Present (06/26/2022)   Received from [facility], [facility]  Tobacco Use: Medium Risk (01/06/2023)   Received from [facility]  Health Literacy: High Risk (06/26/2022)   Received from [facility], [facility]   SDOH Interventions:     Readmission Risk Interventions    09/20/2022    3:20 PM  Readmission Risk Prevention Plan  Transportation Screening Complete  PCP or Specialist Appt within 3-5 Days Complete  HRI or Home Care Consult Complete  Social Work Consult for Recovery Care Planning/Counseling Complete   Palliative Care Screening Not Applicable  Medication Review [employer]) Complete

## 2023-01-15 NOTE — Progress Notes (Addendum)
Bladder scan post void after incontience in the bed. Notified B.Jon Billings, NP.   Mikkel.Chamorro- voided 800.

## 2023-01-15 NOTE — Consult Note (Signed)
PHARMACY CONSULT NOTE - ELECTROLYTES  Pharmacy Consult for Electrolyte Monitoring and Replacement   Recent Labs: Weight: 78.9 kg (174 lb) Estimated Creatinine Clearance: 84.7 mL/min (by C-G formula based on SCr of 0.75 mg/dL).  Potassium (mmol/L)  Date Value  01/15/2023 4.1   Magnesium (mg/dL)  Date Value  16/10/9602 2.0   Calcium (mg/dL)  Date Value  54/09/8117 7.9 (L)   Albumin (g/dL)  Date Value  14/78/2956 2.4 (L)   Phosphorus (mg/dL)  Date Value  21/30/8657 2.5   Sodium (mmol/L)  Date Value  01/15/2023 133 (L)   Corrected Ca: 8.98 mg/dL  Assessment  Adrian Gillespie is a 72 y.o. male presenting with altered mental status. PMH significant for hypertension, atrial fibrillation on Eliquis, COPD, GERD, and schizophrenia. Pharmacy has been consulted to monitor and replace electrolytes.  Diet: reg MIVF: none  Goal of Therapy: Electrolytes WNL  Plan:  No electrolyte replacement indicated at this time  Check BMP, Mg, Phos with AM labs  Thank you for allowing pharmacy to be a part of this patient's care.  Lesslie Mckeehan A, PharmD 01/15/2023 12:23 PM

## 2023-01-15 NOTE — ED Notes (Signed)
Pt voided in brief at this time. Brief changed. Peri care preformed.

## 2023-01-15 NOTE — Procedures (Signed)
Patient Name: Adrian Gillespie  MRN: [id number]  Epilepsy Attending: Charlsie Quest  Referring Physician/Provider: Kathrynn Running, MD Date: 01/14/2023  Duration: 31.06 mins  Patient history: 72yo M with ams getting eeg to evaluate for seizure  Level of alertness: Awake, asleep  AEDs during EEG study: None  Technical aspects: This EEG study was done with scalp electrodes positioned according to the 10-20 International system of electrode placement. Electrical activity was reviewed with band pass filter of 1-70Hz , sensitivity of 7 uV/mm, display speed of 17mm/sec with a 60Hz  notched filter applied as appropriate. EEG data were recorded continuously and digitally stored.  Video monitoring was available and reviewed as appropriate.  Description: No clear posterior dominant rhythm was seen. Sleep was characterized by sleep spindles (12 to 14 Hz), maximal frontocentral region. EEG showed continuous generalized predominantly 5  to 7 Hz theta slowing admixed with intermittent generalized 2-3Hz  delta slowing. Hyperventilation and photic stimulation were not performed.     ABNORMALITY - Continuous slow, generalized  IMPRESSION: This study is suggestive of moderate diffuse encephalopathy. No seizures or epileptiform discharges were seen throughout the recording.  Shiryl Ruddy Annabelle Harman

## 2023-01-15 NOTE — Progress Notes (Signed)
       CROSS COVER NOTE  NAME: Adrian Gillespie MRN: [id number] DOB : 09/11/1950    Concern as stated by nurse / staff   Requested by [facility] for downgrade level of care     Pertinent findings on chart review:   Assessment and  Interventions   Assessment:    01/15/2023    2:00 AM 01/14/2023   11:00 PM 01/14/2023    8:45 PM  Vitals with BMI  Systolic 109 112   Diastolic 67 74   Pulse 78 73 71   Afebrile Significant improvement in vitals. He is alert and appears at his baseline function  Plan: Downgrade to medsurg with cardiac monitoring       Donnie Mesa NP [facility] Cross Cover 7pm-7am - check amion for availability Pager [phone]

## 2023-01-15 NOTE — Progress Notes (Signed)
PT Cancellation Note  Patient Details Name: Adrian Gillespie MRN: [id number] DOB: 10-17-1950   Cancelled Treatment:    Reason Eval/Treat Not Completed: Medical issues which prohibited therapy Spoke with nursing just before noon, she reports pt had been agitated and has consisently been tachy.  States he is not going to be a good PT candidate today.  Will maintain on caseload and attempt to see as appropriate.  Malachi Pro, DPT 01/15/2023, 12:39 PM

## 2023-01-15 NOTE — Progress Notes (Signed)
OT Cancellation Note  Patient Details Name: Adrian Gillespie MRN: [id number] DOB: 1950/12/20   Cancelled Treatment:    Reason Eval/Treat Not Completed: Other (comment)Per nurse, pt agitated all morning, therefore he was given meds to calm him. Pt resting in bed and per nurse with HR at 113. Per nurse, hold on OT eval today. May check back this afternoon if appropriate/time allows.  [name] Goltz 01/15/2023, 9:49 AM

## 2023-01-15 NOTE — Consult Note (Addendum)
NAME: Adrian Gillespie  DOB: 1950/08/22  MRN: [id number]  Date/Time: 01/15/2023 10:34 AM  REQUESTING PROVIDER: Dr. Ashok Pall Subjective:  REASON FOR CONSULT: Ecoli bacteremia ? Adrian Gillespie is a 72 y.o. with a history of Schizophrenia, left THA, anemia, COPD, CAD, paroxysmal afib, fall with rt hip intertrochanteric fracture and had medullary nailing on 09/19/22, left femur fracture in jan 2022 managed conservatively as it was non displaced presented to the ED on 01/13/23 after a fall, he had a temp of 103.2, HR 132 and he was disoriented In the ED vitals  01/13/23 21:24  BP 100/79  Pulse Rate 127 !  Resp 20  SpO2 94 %  O2 Flow Rate (L/min) 3 L/min  Temp 102.2   Latest Reference Range & Units 01/13/23 21:29  WBC 4.0 - 10.5 K/uL 12.7 (H)  Hemoglobin 13.0 - 17.0 g/dL 95.6 (L)  HCT 21.3 - 08.6 % 30.4 (L)  Platelets 150 - 400 K/uL 284  Creatinine 0.61 - 1.24 mg/dL 5.78   CT no acute findings Blood culture sent I am seeing the patient as he ecoli in blood culture with no obvious source Pt says he has incontinence and cannot control dribble. 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)    HLD (hyperlipidemia)    HTN (hypertension)    Schizophrenia (HCC)     Past Surgical History:  Procedure Laterality Date   INTRAMEDULLARY (IM) NAIL INTERTROCHANTERIC Right 09/19/2022   Procedure: INTRAMEDULLARY (IM) NAIL INTERTROCHANTERIC;  Surgeon: Reinaldo Berber, MD;  Location: [facility] ORS;  Service: Orthopedics;  Laterality: Right;    Social History   Socioeconomic History   Marital status: Single    Spouse name: Not on file   Number of children: Not on file   Years of education: Not on file   Highest education level: Not on file  Occupational History   Not  on file  Tobacco Use   Smoking status: Every Day    Current packs/day: 0.50    Types: Cigarettes   Smokeless tobacco: Never   Tobacco comments:    quit 1 month ago  Vaping Use   Vaping status: Never Used  Substance and Sexual Activity   Alcohol use: Not Currently   Drug use: Never   Sexual activity: Not on file  Other Topics Concern   Not on file  Social History Narrative   Not on file   Social Drivers of Health   Financial Resource Strain: Medium Risk (06/26/2022)   Received [facility], [facility]   Overall Financial Resource Strain (CARDIA)    Difficulty of Paying Living Expenses: Somewhat hard  Food Insecurity: No Food Insecurity (01/15/2023)   Hunger Vital Sign    Worried About Running Out of Food in the Last Year: Never true    Ran Out of Food in the Last Year: Never true  Transportation Needs: No Transportation Needs (01/15/2023)   PRAPARE - Administrator, Civil Service (Medical): No    Lack of Transportation (Non-Medical): No  Physical Activity: Sufficiently Active (06/26/2022)   Received from [facility], [facility]   Exercise Vital Sign    Days of Exercise per Week: 6 days  Minutes of Exercise per Session: 60 min  Stress: No Stress Concern Present (06/26/2022)   Received from [facility], [facility]   [facility] - Occupational Stress Questionnaire    Feeling of Stress : Only a little  Social Connections: Moderately Isolated (07/18/2020)   Received from [facility], [facility]   Social Connection and Isolation Panel [NHANES]    Frequency of Communication with Friends and Family: More than three times a week    Frequency of Social Gatherings with Friends and Family: Never    Attends Religious Services: Never    Database administrator or Organizations: No    Attends Banker Meetings: Never    Marital Status: Living with partner  Intimate Partner Violence: Not At Risk  (01/15/2023)   Humiliation, Afraid, Rape, and Kick questionnaire    Fear of Current or Ex-Partner: No    Emotionally Abused: No    Physically Abused: No    Sexually Abused: No    Family History  Problem Relation Age of Onset   Arthritis/Rheumatoid Mother    Allergies  Allergen Reactions   Shellfish-Derived Products Hives   I? Current Facility-Administered Medications  Medication Dose Route Frequency Provider Last Rate Last Admin   acetaminophen (TYLENOL) tablet 650 mg  650 mg Oral Q6H PRN Lorretta Harp, MD       albuterol (PROVENTIL) (2.5 MG/3ML) 0.083% nebulizer solution 2.5 mg  2.5 mg Nebulization Q4H PRN Lorretta Harp, MD       benztropine (COGENTIN) tablet 1 mg  1 mg Oral Daily Lorretta Harp, MD   1 mg at 01/15/23 0857   cefTRIAXone (ROCEPHIN) 2 g in sodium chloride 0.9 % 100 mL IVPB  2 g Intravenous Daily Kathrynn Running, MD   Stopped at 01/14/23 1403   dextromethorphan-guaiFENesin (MUCINEX DM) 30-600 MG per 12 hr tablet 1 tablet  1 tablet Oral BID PRN Lorretta Harp, MD       diphenhydrAMINE (BENADRYL) injection 12.5 mg  12.5 mg Intravenous Q8H PRN Lorretta Harp, MD       enoxaparin (LOVENOX) injection 40 mg  40 mg Subcutaneous Q24H Kathrynn Running, MD   40 mg at 01/14/23 2312   feeding supplement (ENSURE ENLIVE / ENSURE PLUS) liquid 237 mL  237 mL Oral BID BM Lorretta Harp, MD   237 mL at 01/15/23 0857   haloperidol (HALDOL) tablet 10 mg  10 mg Oral BID Kathrynn Running, MD   10 mg at 01/15/23 0857   hydrOXYzine (ATARAX) tablet 50 mg  50 mg Oral BID PRN Lorretta Harp, MD   50 mg at 01/15/23 0857   magnesium hydroxide (MILK OF MAGNESIA) suspension 30 mL  30 mL Oral Daily PRN Lorretta Harp, MD       midodrine (PROAMATINE) tablet 10 mg  10 mg Oral TID WC Lorretta Harp, MD   10 mg at 01/15/23 0857   mometasone-formoterol (DULERA) 100-5 MCG/ACT inhaler 2 puff  2 puff Inhalation BID Kathrynn Running, MD   2 puff at 01/15/23 0857   montelukast (SINGULAIR) tablet 10 mg  10 mg Oral Daily Lorretta Harp, MD    10 mg at 01/15/23 1610   multivitamin with minerals tablet 1 tablet  1 tablet Oral Daily Lorretta Harp, MD   1 tablet at 01/15/23 0856   nicotine (NICODERM CQ - dosed in mg/24 hours) patch 21 mg  21 mg Transdermal Daily Lorretta Harp, MD   21 mg at 01/15/23  0900   oxyCODONE-acetaminophen (PERCOCET/ROXICET) 5-325 MG per tablet 1 tablet  1 tablet Oral Q4H PRN Lorretta Harp, MD   1 tablet at 01/15/23 0857   pantoprazole (PROTONIX) EC tablet 40 mg  40 mg Oral Daily Lorretta Harp, MD   40 mg at 01/15/23 0857   tamsulosin (FLOMAX) capsule 0.4 mg  0.4 mg Oral QPC supper Kathrynn Running, MD   0.4 mg at 01/14/23 1847   traZODone (DESYREL) tablet 300 mg  300 mg Oral QHS Kathrynn Running, MD   300 mg at 01/14/23 2309     Abtx:  Anti-infectives (From admission, onward)    Start     Dose/Rate Route Frequency Ordered Stop   01/14/23 2300  vancomycin (VANCOREADY) IVPB 1750 mg/350 mL  Status:  Discontinued        1,750 mg 175 mL/hr over 120 Minutes Intravenous Every 24 hours 01/14/23 0352 01/14/23 0840   01/14/23 1400  cefTRIAXone (ROCEPHIN) 2 g in sodium chloride 0.9 % 100 mL IVPB        2 g 200 mL/hr over 30 Minutes Intravenous Daily 01/14/23 1203     01/14/23 0900  metroNIDAZOLE (FLAGYL) IVPB 500 mg  Status:  Discontinued        500 mg 100 mL/hr over 60 Minutes Intravenous  Once 01/14/23 0124 01/14/23 0840   01/14/23 0700  ceFEPIme (MAXIPIME) 2 g in sodium chloride 0.9 % 100 mL IVPB  Status:  Discontinued        2 g 200 mL/hr over 30 Minutes Intravenous Every 8 hours 01/14/23 0349 01/14/23 0840   01/13/23 2215  vancomycin (VANCOREADY) IVPB 1750 mg/350 mL  Status:  Discontinued        1,750 mg 175 mL/hr over 120 Minutes Intravenous  Once 01/13/23 2208 01/13/23 2211   01/13/23 2215  vancomycin (VANCOCIN) IVPB 1000 mg/200 mL premix       Placed in "Followed by" Linked Group   1,000 mg 200 mL/hr over 60 Minutes Intravenous  Once 01/13/23 2211 01/14/23 0021   01/13/23 2215  vancomycin (VANCOREADY) IVPB 750  mg/150 mL       Placed in "Followed by" Linked Group   750 mg 150 mL/hr over 60 Minutes Intravenous  Once 01/13/23 2211 01/14/23 0120   01/13/23 2130  ceFEPIme (MAXIPIME) 2 g in sodium chloride 0.9 % 100 mL IVPB        2 g 200 mL/hr over 30 Minutes Intravenous  Once 01/13/23 2123 01/13/23 2319   01/13/23 2130  metroNIDAZOLE (FLAGYL) IVPB 500 mg        500 mg 100 mL/hr over 60 Minutes Intravenous  Once 01/13/23 2123 01/13/23 2319   01/13/23 2130  vancomycin (VANCOCIN) IVPB 1000 mg/200 mL premix  Status:  Discontinued        1,000 mg 200 mL/hr over 60 Minutes Intravenous  Once 01/13/23 2123 01/13/23 2207       REVIEW OF SYSTEMS:  Const:  fever, negative chills, negative weight loss Eyes: negative diplopia or visual changes, negative eye pain ENT: negative coryza, negative sore throat Resp: negative cough, hemoptysis, dyspnea Cards: negative for chest pain, palpitations, lower extremity edema GU: dribbling of urine GI: Negative for abdominal pain, diarrhea, bleeding, constipation Skin: negative for rash and pruritus Heme: negative for easy bruising and gum/nose bleeding MS: left shoulder pain Neurolo:, dizziness, falls Psych: had some confusion Endocrine: negative for thyroid, diabetes Allergy/Immunology- negative for any medication or food allergies ? Pertinent Positives include : Objective:  VITALS:  BP 129/76 (BP Location: Left Arm)   Pulse (!) 119   Temp 97.7 F (36.5 C) (Oral)   Resp 17   Wt 78.9 kg   SpO2 96%   BMI 25.70 kg/m   PHYSICAL EXAM:  General: Alert, cooperative, no distress, appears stated age.  Head: Normocephalic, without obvious abnormality, atraumatic. Eyes: Conjunctivae clear, anicteric sclerae. Pupils are equal ENT Nares normal. No drainage or sinus tenderness. Lips, mucosa, and tongue normal. No Thrush edentulous Neck: Supple, symmetrical, no adenopathy, thyroid: non tender no carotid bruit and no JVD. Back: No CVA tenderness. Lungs: Clear  to auscultation bilaterally. No Wheezing or Rhonchi. No rales. Heart: Regular rate and rhythm, no murmur, rub or gallop. Abdomen: Soft, non-tender,not distended. Bowel sounds normal. No masses Extremities: left shoulder pain- abduction possible but not complete Skin: No rashes or lesions. Or bruising Lymph: Cervical, supraclavicular normal. Neurologic: Grossly non-focal Pertinent Labs Lab Results CBC    Component Value Date/Time   WBC 12.9 (H) 01/14/2023 0657   RBC 4.24 01/14/2023 0657   HGB 10.5 (L) 01/14/2023 0657   HCT 31.7 (L) 01/14/2023 0657   PLT 263 01/14/2023 0657   MCV 74.8 (L) 01/14/2023 0657   MCH 24.8 (L) 01/14/2023 0657   MCHC 33.1 01/14/2023 0657   RDW 18.1 (H) 01/14/2023 0657   LYMPHSABS 0.4 (L) 01/13/2023 2129   MONOABS 0.5 01/13/2023 2129   EOSABS 0.0 01/13/2023 2129   BASOSABS 0.0 01/13/2023 2129       Latest Ref Rng & Units 01/15/2023    4:47 AM 01/14/2023    6:57 AM 01/13/2023   11:33 PM  CMP  Glucose 70 - 99 mg/dL 99  811  914   BUN 8 - 23 mg/dL 10  9  11    Creatinine 0.61 - 1.24 mg/dL 7.82  9.56  2.13   Sodium 135 - 145 mmol/L 133  138  128   Potassium 3.5 - 5.1 mmol/L 4.1  4.6  3.1   Chloride 98 - 111 mmol/L 104  110  99   CO2 22 - 32 mmol/L 21  21  21    Calcium 8.9 - 10.3 mg/dL 7.9  7.7  7.7       Microbiology: Recent Results (from the past 240 hours)  Resp panel by RT-PCR (RSV, Flu A&B, Covid) Anterior Nasal Swab     Status: None   Collection Time: 01/13/23  9:29 PM   Specimen: Anterior Nasal Swab  Result Value Ref Range Status   SARS Coronavirus 2 by RT PCR NEGATIVE NEGATIVE Final    Comment: (NOTE) SARS-CoV-2 target nucleic acids are NOT DETECTED.  The SARS-CoV-2 RNA is generally detectable in upper respiratory specimens during the acute phase of infection. The lowest concentration of SARS-CoV-2 viral copies this assay can detect is 138 copies/mL. A negative result does not preclude SARS-Cov-2 infection and should not be used as  the sole basis for treatment or other patient management decisions. A negative result may occur with  improper specimen collection/handling, submission of specimen other than nasopharyngeal swab, presence of viral mutation(s) within the areas targeted by this assay, and inadequate number of viral copies(<138 copies/mL). A negative result must be combined with clinical observations, patient history, and epidemiological information. The expected result is Negative.  Fact Sheet for Patients:  BloggerCourse.com  Fact Sheet for Healthcare Providers:  SeriousBroker.it  This test is no t yet approved or cleared by the [place] FDA and  has been authorized for detection and/or diagnosis of SARS-CoV-2  by FDA under an Emergency Use Authorization (EUA). This EUA will remain  in effect (meaning this test can be used) for the duration of the COVID-19 declaration under Section 564(b)(1) of the Act, 21 U.S.C.section 360bbb-3(b)(1), unless the authorization is terminated  or revoked sooner.       Influenza A by PCR NEGATIVE NEGATIVE Final   Influenza B by PCR NEGATIVE NEGATIVE Final    Comment: (NOTE) The Xpert Xpress SARS-CoV-2/FLU/RSV plus assay is intended as an aid in the diagnosis of influenza from Nasopharyngeal swab specimens and should not be used as a sole basis for treatment. Nasal washings and aspirates are unacceptable for Xpert Xpress SARS-CoV-2/FLU/RSV testing.  Fact Sheet for Patients: BloggerCourse.com  Fact Sheet for Healthcare Providers: SeriousBroker.it  This test is not yet approved or cleared by the [place] FDA and has been authorized for detection and/or diagnosis of SARS-CoV-2 by FDA under an Emergency Use Authorization (EUA). This EUA will remain in effect (meaning this test can be used) for the duration of the COVID-19 declaration under Section 564(b)(1) of  the Act, 21 U.S.C. section 360bbb-3(b)(1), unless the authorization is terminated or revoked.     Resp Syncytial Virus by PCR NEGATIVE NEGATIVE Final    Comment: (NOTE) Fact Sheet for Patients: BloggerCourse.com  Fact Sheet for Healthcare Providers: SeriousBroker.it  This test is not yet approved or cleared by the [place] FDA and has been authorized for detection and/or diagnosis of SARS-CoV-2 by FDA under an Emergency Use Authorization (EUA). This EUA will remain in effect (meaning this test can be used) for the duration of the COVID-19 declaration under Section 564(b)(1) of the Act, 21 U.S.C. section 360bbb-3(b)(1), unless the authorization is terminated or revoked.  Performed at [facility], [address] [place], [place] [address]   Blood Culture (routine x 2)     Status: Abnormal (Preliminary result)   Collection Time: 01/13/23  9:29 PM   Specimen: BLOOD  Result Value Ref Range Status   Specimen Description   Final    BLOOD BLOOD LEFT ARM Performed at [facility], [address], [place], [place] [address]    Special Requests   Final    BOTTLES DRAWN AEROBIC AND ANAEROBIC Blood Culture adequate volume Performed at [facility], [address] [place], [place] [address]    Culture  Setup Time   Final    Organism ID to follow ANAEROBIC BOTTLE ONLY GRAM NEGATIVE RODS CRITICAL RESULT CALLED TO, READ BACK BY AND VERIFIED WITH: TREY GREENWOOD 01/14/23 1142 KLW Performed at [facility], [address] [place], [place] [address]    Culture (A)  Final    ESCHERICHIA COLI SUSCEPTIBILITIES TO FOLLOW Performed at [facility], [address], [place], [place] [address]    Report Status PENDING  Incomplete  Blood Culture ID Panel (Reflexed)     Status: Abnormal   Collection Time: 01/13/23  9:29 PM  Result Value Ref Range Status   Enterococcus faecalis NOT DETECTED  NOT DETECTED Final   Enterococcus Faecium NOT DETECTED NOT DETECTED Final   Listeria monocytogenes NOT DETECTED NOT DETECTED Final   Staphylococcus species NOT DETECTED NOT DETECTED Final   Staphylococcus aureus (BCID) NOT DETECTED NOT DETECTED Final   Staphylococcus epidermidis NOT DETECTED NOT DETECTED Final   Staphylococcus lugdunensis NOT DETECTED NOT DETECTED Final   Streptococcus species NOT DETECTED NOT DETECTED Final   Streptococcus agalactiae NOT DETECTED NOT DETECTED Final   Streptococcus pneumoniae NOT DETECTED NOT  DETECTED Final   Streptococcus pyogenes NOT DETECTED NOT DETECTED Final   A.calcoaceticus-baumannii NOT DETECTED NOT DETECTED Final   Bacteroides fragilis NOT DETECTED NOT DETECTED Final   Enterobacterales DETECTED (A) NOT DETECTED Final    Comment: Enterobacterales represent a large order of gram negative bacteria, not a single organism. CRITICAL RESULT CALLED TO, READ BACK BY AND VERIFIED WITH: TREY GREENWOOD 01/14/23 1142 KLW    Enterobacter cloacae complex NOT DETECTED NOT DETECTED Final   Escherichia coli DETECTED (A) NOT DETECTED Final    Comment: CRITICAL RESULT CALLED TO, READ BACK BY AND VERIFIED WITH: TREY GREENWOOD 01/14/23 1142 KLW    Klebsiella aerogenes NOT DETECTED NOT DETECTED Final   Klebsiella oxytoca NOT DETECTED NOT DETECTED Final   Klebsiella pneumoniae NOT DETECTED NOT DETECTED Final   Proteus species NOT DETECTED NOT DETECTED Final   Salmonella species NOT DETECTED NOT DETECTED Final   Serratia marcescens NOT DETECTED NOT DETECTED Final   Haemophilus influenzae NOT DETECTED NOT DETECTED Final   Neisseria meningitidis NOT DETECTED NOT DETECTED Final   Pseudomonas aeruginosa NOT DETECTED NOT DETECTED Final   Stenotrophomonas maltophilia NOT DETECTED NOT DETECTED Final   Candida albicans NOT DETECTED NOT DETECTED Final   Candida auris NOT DETECTED NOT DETECTED Final   Candida glabrata NOT DETECTED NOT DETECTED Final   Candida krusei NOT  DETECTED NOT DETECTED Final   Candida parapsilosis NOT DETECTED NOT DETECTED Final   Candida tropicalis NOT DETECTED NOT DETECTED Final   Cryptococcus neoformans/gattii NOT DETECTED NOT DETECTED Final   CTX-M ESBL NOT DETECTED NOT DETECTED Final   Carbapenem resistance IMP NOT DETECTED NOT DETECTED Final   Carbapenem resistance KPC NOT DETECTED NOT DETECTED Final   Carbapenem resistance NDM NOT DETECTED NOT DETECTED Final   Carbapenem resist OXA 48 LIKE NOT DETECTED NOT DETECTED Final   Carbapenem resistance VIM NOT DETECTED NOT DETECTED Final    Comment: Performed at [facility], [address] [place], [place] [address]  Blood Culture (routine x 2)     Status: None (Preliminary result)   Collection Time: 01/13/23 11:06 PM   Specimen: BLOOD  Result Value Ref Range Status   Specimen Description BLOOD RIGHT ARM  Final   Special Requests   Final    BOTTLES DRAWN AEROBIC AND ANAEROBIC Blood Culture adequate volume   Culture   Final    NO GROWTH 1 DAY Performed at [facility], [address] [place], [place] [address]    Report Status PENDING  Incomplete  Respiratory (~20 pathogens) panel by PCR     Status: None   Collection Time: 01/14/23 12:15 PM   Specimen: Nasopharyngeal Swab; Respiratory  Result Value Ref Range Status   Adenovirus NOT DETECTED NOT DETECTED Final   Coronavirus 229E NOT DETECTED NOT DETECTED Final    Comment: (NOTE) The Coronavirus on the Respiratory Panel, DOES NOT test for the novel  Coronavirus (2019 nCoV)    Coronavirus HKU1 NOT DETECTED NOT DETECTED Final   Coronavirus NL63 NOT DETECTED NOT DETECTED Final   Coronavirus OC43 NOT DETECTED NOT DETECTED Final   Metapneumovirus NOT DETECTED NOT DETECTED Final   Rhinovirus / Enterovirus NOT DETECTED NOT DETECTED Final   Influenza A NOT DETECTED NOT DETECTED Final   Influenza B NOT DETECTED NOT DETECTED Final   Parainfluenza Virus 1 NOT DETECTED NOT DETECTED Final   Parainfluenza Virus 2  NOT DETECTED NOT DETECTED Final   Parainfluenza Virus 3 NOT DETECTED NOT DETECTED Final   Parainfluenza Virus  4 NOT DETECTED NOT DETECTED Final   Respiratory Syncytial Virus NOT DETECTED NOT DETECTED Final   Bordetella pertussis NOT DETECTED NOT DETECTED Final   Bordetella Parapertussis NOT DETECTED NOT DETECTED Final   Chlamydophila pneumoniae NOT DETECTED NOT DETECTED Final   Mycoplasma pneumoniae NOT DETECTED NOT DETECTED Final    Comment: Performed at [facility], [address], [place], [place] [address]    IMAGING RESULTS: CT ehead -- no acute findings I have personally reviewed the films ? Impression/Recommendation ?72 yr male presenting with confusion, fever, falls  Encephalopathy- metabolic Improved  Sepsis secondary to ecoli Has ecoli bacteremia with no clear source Has urinary incontinence Check bladder scan Can do abdominal imaging- CT to look for any pathology in GIT, kidney or gall bladder Currently on ceftriaxone Await susceptibility to decide on discharge antibiotic  Fall- pain left shoulder- xray N  Anemia  Hyponatremia   ?COPD  Schizophrenia  ? ________________________________________________ Discussed with patient

## 2023-01-16 ENCOUNTER — Inpatient Hospital Stay: Payer: Medicare Other

## 2023-01-16 DIAGNOSIS — B9620 Unspecified Escherichia coli [E. coli] as the cause of diseases classified elsewhere: Secondary | ICD-10-CM | POA: Diagnosis not present

## 2023-01-16 DIAGNOSIS — R7881 Bacteremia: Secondary | ICD-10-CM | POA: Diagnosis not present

## 2023-01-16 DIAGNOSIS — G9341 Metabolic encephalopathy: Secondary | ICD-10-CM | POA: Diagnosis not present

## 2023-01-16 DIAGNOSIS — R6510 Systemic inflammatory response syndrome (SIRS) of non-infectious origin without acute organ dysfunction: Secondary | ICD-10-CM | POA: Diagnosis not present

## 2023-01-16 DIAGNOSIS — R32 Unspecified urinary incontinence: Secondary | ICD-10-CM | POA: Diagnosis not present

## 2023-01-16 LAB — BASIC METABOLIC PANEL
Anion gap: 8 (ref 5–15)
BUN: 12 mg/dL (ref 8–23)
CO2: 25 mmol/L (ref 22–32)
Calcium: 8 mg/dL — ABNORMAL LOW (ref 8.9–10.3)
Chloride: 105 mmol/L (ref 98–111)
Creatinine, Ser: 0.78 mg/dL (ref 0.61–1.24)
GFR, Estimated: >60 mL/min (ref >60)
Glucose, Bld: 115 mg/dL — ABNORMAL HIGH (ref 70–99)
Potassium: 3.9 mmol/L (ref 3.5–5.1)
Sodium: 138 mmol/L (ref 135–145)

## 2023-01-16 LAB — CULTURE, BLOOD (ROUTINE X 2): Special Requests: ADEQUATE

## 2023-01-16 MED ORDER — IOHEXOL 300 MG/ML  SOLN
100.0000 mL | Freq: Once | INTRAMUSCULAR | Status: AC | PRN
  Administered 2023-01-16: 100 mL via INTRAVENOUS

## 2023-01-16 MED ORDER — DEXTROSE-SODIUM CHLORIDE 5-0.45 % IV SOLN
INTRAVENOUS | Status: AC
Start: 2023-01-16 — End: 2023-01-17

## 2023-01-16 MED ORDER — CARVEDILOL 3.125 MG PO TABS
3.1250 mg | ORAL_TABLET | Freq: Two times a day (BID) | ORAL | Status: DC
  Administered 2023-01-16 – 2023-01-29 (×24): 3.125 mg via ORAL
  Filled 2023-01-16 (×28): qty 1

## 2023-01-16 MED ORDER — LORAZEPAM 2 MG/ML IJ SOLN: 2.0000 mg | Freq: Once | INTRAMUSCULAR | Status: DC | PRN

## 2023-01-16 MED ORDER — IOHEXOL 300 MG/ML  SOLN
30.0000 mL | Freq: Once | INTRAMUSCULAR | Status: AC | PRN
  Administered 2023-01-16: 30 mL via ORAL

## 2023-01-16 NOTE — Plan of Care (Signed)
  Problem: Clinical Measurements: Goal: Diagnostic test results will improve Outcome: Progressing Goal: Respiratory complications will improve Outcome: Progressing Goal: Cardiovascular complication will be avoided Outcome: Progressing   Problem: Clinical Measurements: Goal: Respiratory complications will improve Outcome: Progressing   Problem: Clinical Measurements: Goal: Cardiovascular complication will be avoided Outcome: Progressing

## 2023-01-16 NOTE — Care Management Important Message (Signed)
Important Message  Patient Details  Name: Adrian Gillespie MRN: [id number] Date of Birth: 03-10-1950   Important Message Given:  Yes - Medicare IM     Bernadette Hoit 01/16/2023, 10:15 AM

## 2023-01-16 NOTE — Evaluation (Signed)
Physical Therapy Evaluation Patient Details Name: Adrian Gillespie MRN: [id number] DOB: 01/17/1951 Today's Date: 01/16/2023  History of Present Illness  Pt is a 72 y.o. male with PMHx: HTN, HLD, COPD, asthma, GERD, depression, anxiety, schizophrenia, HBV, tobacco abuse, who presents with AMS, fever and fall. Dx of SIRS. EEG findings: moderate diffuse encephalopathy.   Clinical Impression  Pt oriented to name and that he was in the hospital, unable to provide PLOF. After discussion with CSW pt is from [place], [place]. He is a one person assist, utilizes a WC and was working with physical therapy (had been declining OT). The patient  required minA to come up to sitting EOB but limited more cognitively and due to pain that due to weakness. Sit <> stand from EOB minA and to step pivot to recliner with RW. Able to stand from recliner with cues for hand placement CGA with RW. He ambulated ~37ft with close chair follow, narrow BOS noted and decreased gait velocity.  Overall the patient demonstrated deficits (see "PT Problem List") that impede the patient's functional abilities, safety, and mobility and would benefit from skilled PT intervention.          If plan is discharge home, recommend the following: A little help with walking and/or transfers;A little help with bathing/dressing/bathroom;Assistance with cooking/housework;Supervision due to cognitive status;Assist for transportation;Direct supervision/assist for medications management;Help with stairs or ramp for entrance   Can travel by private vehicle        Equipment Recommendations Other (comment) (TBD)  Recommendations for Other Services       Functional Status Assessment Patient has had a recent decline in their functional status and demonstrates the ability to make significant improvements in function in a reasonable and predictable amount of time.     Precautions / Restrictions Precautions Precautions: Fall Restrictions Weight Bearing  Restrictions Per Provider Order: No      Mobility  Bed Mobility Overal bed mobility: Needs Assistance Bed Mobility: Supine to Sit     Supine to sit: Min assist     General bed mobility comments: Pt limited by pain in groin when moving legs towards EOB; appears more self-limiting due to pain rather than weakness    Transfers Overall transfer level: Needs assistance Equipment used: Rolling walker (2 wheels) Transfers: Sit to/from Stand Sit to Stand: Min assist, Contact guard assist           General transfer comment: progressed to CGA from recliner with cued for hand placement    Ambulation/Gait Ambulation/Gait assistance: Contact guard assist Gait Distance (Feet): 40 Feet Assistive device: Rolling walker (2 wheels)            Stairs            Wheelchair Mobility     Tilt Bed    Modified Rankin (Stroke Patients Only)       Balance Overall balance assessment: Needs assistance Sitting-balance support: Feet supported, Bilateral upper extremity supported Sitting balance-Leahy Scale: Fair     Standing balance support: Reliant on assistive device for balance, Bilateral upper extremity supported Standing balance-Leahy Scale: Fair                               Pertinent Vitals/Pain Pain Assessment Pain Assessment: Faces Faces Pain Scale: Hurts little more Pain Location: groin; R shoulder with resistance Pain Descriptors / Indicators: Aching, Pressure Pain Intervention(s): Limited activity within patient's tolerance, Monitored during session, Repositioned  Home Living Family/patient expects to be discharged to:: Skilled nursing facility                   Additional Comments: Per notes, pt arrived from SNF. Pt unable to give accurate information about home set up; states he lives in an apartment; does not mention SNF until asked whether he's from rehab.    Prior Function Prior Level of Function : History of Falls (last six  months)             Mobility Comments: Medical record indicates falls; pt endorses using RW prior, but poor historian, so difficult to say what is true. CSW stated pt is from ALF ADLs Comments: Unable to state; pt poor historian.     Extremity/Trunk Assessment   Upper Extremity Assessment Upper Extremity Assessment: Defer to OT evaluation    Lower Extremity Assessment Lower Extremity Assessment: Generalized weakness (able to lift against gravity without assistance but did reference pain)    Cervical / Trunk Assessment Cervical / Trunk Assessment: Normal  Communication   Communication Communication: No apparent difficulties  Cognition Arousal: Alert Behavior During Therapy: WFL for tasks assessed/performed Overall Cognitive Status: No family/caregiver present to determine baseline cognitive functioning                                 General Comments: Pt is oriented to self, and the fact that he's in the hospital. Unable to state his birthday, the current day, or much information about PLOF. What he did say about PLOF does not match the medical record.        General Comments General comments (skin integrity, edema, etc.): Pt on room air    Exercises     Assessment/Plan    PT Assessment Patient needs continued PT services  PT Problem List Decreased strength;Decreased range of motion;Decreased activity tolerance;Decreased balance;Decreased mobility;Decreased safety awareness       PT Treatment Interventions DME instruction;Balance training;Gait training;Neuromuscular re-education;Stair training;Functional mobility training;Patient/family education;Therapeutic activities;Therapeutic exercise    PT Goals (Current goals can be found in the Care Plan section)  Acute Rehab PT Goals Patient Stated Goal: to feel better PT Goal Formulation: With patient Time For Goal Achievement: 01/30/23 Potential to Achieve Goals: Fair    Frequency Min 1X/week      Co-evaluation PT/OT/SLP Co-Evaluation/Treatment: Yes Reason for Co-Treatment: For patient/therapist safety;Complexity of the patient's impairments (multi-system involvement) PT goals addressed during session: Mobility/safety with mobility;Balance;Proper use of DME OT goals addressed during session: ADL's and self-care;Strengthening/ROM       AM-PAC PT "6 Clicks" Mobility  Outcome Measure Help needed turning from your back to your side while in a flat bed without using bedrails?: A Little Help needed moving from lying on your back to sitting on the side of a flat bed without using bedrails?: A Little Help needed moving to and from a bed to a chair (including a wheelchair)?: A Little Help needed standing up from a chair using your arms (e.g., wheelchair or bedside chair)?: A Little Help needed to walk in hospital room?: A Little Help needed climbing 3-5 steps with a railing? : A Lot 6 Click Score: 17    End of Session Equipment Utilized During Treatment: Gait belt Activity Tolerance: Patient tolerated treatment well Patient left: in chair;with call bell/phone within reach;with chair alarm set Nurse Communication: Mobility status PT Visit Diagnosis: Other abnormalities of gait and mobility (R26.89);Difficulty in walking, not  elsewhere classified (R26.2);Muscle weakness (generalized) (M62.81)    Time: 6433-2951 PT Time Calculation (min) (ACUTE ONLY): 25 min   Charges:   PT Evaluation $PT Eval Low Complexity: 1 Low PT Treatments $Therapeutic Activity: 8-22 mins PT General Charges $$ ACUTE PT VISIT: 1 Visit         Olga Coaster PT, DPT 11:12 AM,01/16/23

## 2023-01-16 NOTE — TOC Progression Note (Signed)
Transition of Care [facility]) - Progression Note    Patient Details  Name: Adrian Gillespie MRN: [id number] Date of Birth: November 27, 1950  Transition of Care [facility]) CM/SW Contact  Margarito Liner, LCSW Phone Number: 01/16/2023, 3:37 PM  Clinical Narrative:   Per MD, patient has new a fib that they are working up and discharge medications on a discharge summary for the ALF may be different today than on the day of discharge. CSW left voicemail for and emailed Dustin at [employer] to determine if this is okay.  Expected Discharge Plan: Assisted Living (with home health) Barriers to Discharge: Continued Medical Work up  Expected Discharge Plan and Services       Living arrangements for the past 2 months: Assisted Living Facility                             [facility] Agency: [facility] Date [facility] Agency Contacted: 01/15/23   Representative spoke with at [facility] Agency: Unknown [place]   Social Determinants of Health (SDOH) Interventions SDOH Screenings   Food Insecurity: No Food Insecurity (01/15/2023)  Housing: Low Risk  (01/15/2023)  Transportation Needs: No Transportation Needs (01/15/2023)  Utilities: Not At Risk (01/15/2023)  Financial Resource Strain: Medium Risk (06/26/2022)   Received from [facility], [facility]  Physical Activity: Sufficiently Active (06/26/2022)   Received from [facility], [facility]  Social Connections: Moderately Isolated (07/18/2020)   Received from [facility], [facility]  Stress: No Stress Concern Present (06/26/2022)   Received from [facility], [facility]  Tobacco Use: Medium Risk (01/06/2023)   Received from [facility]  Health Literacy: High Risk (06/26/2022)   Received from [facility], [facility]    Readmission Risk Interventions    01/16/2023    1:02 PM 01/15/2023    9:35 AM 09/20/2022    3:20 PM  Readmission Risk Prevention Plan  Transportation Screening Complete  Complete  PCP or  Specialist Appt within 3-5 Days  Complete Complete  HRI or Home Care Consult Complete Complete Complete  Social Work Consult for Recovery Care Planning/Counseling Complete Complete Complete  Palliative Care Screening Complete Not Applicable Not Applicable  Medication Review [employer]) Complete  Complete

## 2023-01-16 NOTE — Evaluation (Signed)
Occupational Therapy Evaluation Patient Details Name: Adrian Gillespie MRN: [id number] DOB: 07-28-1950 Today's Date: 01/16/2023   History of Present Illness Pt is a 72 y.o. male with PMHx: HTN, HLD, COPD, asthma, GERD, depression, anxiety, schizophrenia, HBV, tobacco abuse, who presents with AMS, fever and fall. Dx of SIRS. EEG findings: moderate diffuse encephalopathy.   Clinical Impression   Patient received for OT evaluation. See flowsheet below for details of function. Generally, patient requiring MIN A for bed mobility, MIN A RW and +2 for chair follow for functional mobility, and overall MOD A for ADLs. Patient will benefit from continued OT while in acute care.       If plan is discharge home, recommend the following: Two people to help with walking and/or transfers;A lot of help with bathing/dressing/bathroom;Assistance with cooking/housework;Direct supervision/assist for medications management;Assist for transportation;Direct supervision/assist for financial management;Help with stairs or ramp for entrance;Supervision due to cognitive status    Functional Status Assessment  Patient has had a recent decline in their functional status and demonstrates the ability to make significant improvements in function in a reasonable and predictable amount of time.  Equipment Recommendations  Other (comment) (defer)    Recommendations for Other Services       Precautions / Restrictions Precautions Precautions: Fall Restrictions Weight Bearing Restrictions Per Provider Order: No      Mobility Bed Mobility Overal bed mobility: Needs Assistance Bed Mobility: Supine to Sit     Supine to sit: Min assist     General bed mobility comments: Pt limited by pain in groin when moving legs towards EOB; appears more self-limiting due to pain rather than weakness    Transfers Overall transfer level: Needs assistance Equipment used: Rolling walker (2 wheels) Transfers: Sit to/from Stand Sit  to Stand: Min assist                  Balance Overall balance assessment: Needs assistance Sitting-balance support: Feet supported, Bilateral upper extremity supported Sitting balance-Leahy Scale: Fair     Standing balance support: Reliant on assistive device for balance, Bilateral upper extremity supported Standing balance-Leahy Scale: Fair                             ADL either performed or assessed with clinical judgement   ADL Overall ADL's : Needs assistance/impaired Eating/Feeding: Set up;Sitting   Grooming: Set up;Sitting Grooming Details (indicate cue type and reason): seated EOB, set up for oral care and washing face             Lower Body Dressing: Moderate assistance Lower Body Dressing Details (indicate cue type and reason): anticipate moderate assist overall, as pt unsteady while standing; was able to make modified figure four position while seated in recliner to pull up socks.   Toilet Transfer Details (indicate cue type and reason): not tested, but anticipate CGA-MIN A for balance with RW; safer with handle to reach back for         Functional mobility during ADLs: Contact guard assist;Minimal assistance;Rolling walker (2 wheels);+2 for safety/equipment;Cueing for safety (with chair follow for safety) General ADL Comments: Unsure what pt's baseline is.     Vision Patient Visual Report: No change from baseline       Perception         Praxis         Pertinent Vitals/Pain Pain Assessment Pain Assessment: 0-10 Pain Score:  (unrated) Pain Location: groin; R shoulder with resistance Pain  Descriptors / Indicators: Aching, Pressure Pain Intervention(s): Limited activity within patient's tolerance, Monitored during session, Repositioned     Extremity/Trunk Assessment Upper Extremity Assessment Upper Extremity Assessment: Defer to OT evaluation   Lower Extremity Assessment Lower Extremity Assessment: Generalized weakness (able to  lift against gravity without assistance but did reference pain)   Cervical / Trunk Assessment Cervical / Trunk Assessment: Normal   Communication Communication Communication: No apparent difficulties   Cognition Arousal: Alert Behavior During Therapy: WFL for tasks assessed/performed Overall Cognitive Status: No family/caregiver present to determine baseline cognitive functioning                                 General Comments: Pt is oriented to self, and the fact that he's in the hospital. Unable to state his birthday, the current day, or much information about PLOF. What he did say about PLOF does not match the medical record.     General Comments  Pt on room air    Exercises     Shoulder Instructions      Home Living Family/patient expects to be discharged to:: Skilled nursing facility                                 Additional Comments: Per notes, pt arrived from SNF. Pt unable to give accurate information about home set up; states he lives in an apartment; does not mention SNF until asked whether he's from rehab.      Prior Functioning/Environment Prior Level of Function : History of Falls (last six months)             Mobility Comments: Medical record indicates falls; pt endorses using RW prior, but poor historian, so difficult to say what is true. CSW stated pt is from ALF ADLs Comments: Unable to state; pt poor historian.        OT Problem List: Decreased strength;Decreased activity tolerance;Impaired balance (sitting and/or standing);Decreased safety awareness      OT Treatment/Interventions: Self-care/ADL training;Therapeutic exercise;Therapeutic activities;Cognitive remediation/compensation;Patient/family education    OT Goals(Current goals can be found in the care plan section) Acute Rehab OT Goals Patient Stated Goal: Get better OT Goal Formulation: With patient Time For Goal Achievement: 01/30/23 Potential to Achieve  Goals: Good ADL Goals Pt Will Perform Lower Body Dressing: with min assist;sit to/from stand Pt Will Transfer to Toilet: with min assist;bedside commode;stand pivot transfer  OT Frequency: Min 1X/week    Co-evaluation PT/OT/SLP Co-Evaluation/Treatment: Yes Reason for Co-Treatment: For patient/therapist safety;Complexity of the patient's impairments (multi-system involvement) PT goals addressed during session: Mobility/safety with mobility;Balance;Proper use of DME OT goals addressed during session: ADL's and self-care;Strengthening/ROM      AM-PAC OT "6 Clicks" Daily Activity     Outcome Measure Help from another person eating meals?: None Help from another person taking care of personal grooming?: A Little Help from another person toileting, which includes using toliet, bedpan, or urinal?: A Lot Help from another person bathing (including washing, rinsing, drying)?: A Lot Help from another person to put on and taking off regular upper body clothing?: A Little Help from another person to put on and taking off regular lower body clothing?: A Lot 6 Click Score: 16   End of Session Equipment Utilized During Treatment: Rolling walker (2 wheels);Gait belt Nurse Communication: Mobility status  Activity Tolerance: Patient tolerated treatment well Patient left: in chair;with  call bell/phone within reach;with chair alarm set  OT Visit Diagnosis: Unsteadiness on feet (R26.81);Repeated falls (R29.6);Muscle weakness (generalized) (M62.81)                Time: 9604-5409 OT Time Calculation (min): 28 min Charges:  OT General Charges $OT Visit: 1 Visit OT Evaluation $OT Eval Moderate Complexity: 1 Mod OT Treatments $Self Care/Home Management : 8-22 mins  Linward Foster, MS, OTR/L  Alvester Morin 01/16/2023, 10:02 AM

## 2023-01-16 NOTE — Progress Notes (Signed)
Date of Admission:  01/13/2023    ID: Adrian Gillespie is a 72 y.o. male  Principal Problem:   SIRS (systemic inflammatory response syndrome) (HCC) Active Problems:   Hypokalemia   Schizophrenia (HCC)   Chronic obstructive pulmonary disease (HCC)   HTN (hypertension)   AF (paroxysmal atrial fibrillation) (HCC)   Fall at home, initial encounter   Hyponatremia   Prolonged QT interval   Electrolyte disturbance   Hypophosphatemia   Acute metabolic encephalopathy   Left shoulder pain   Hypomagnesemia   Hypotension   E coli bacteremia   Sepsis without acute organ dysfunction (HCC)    Subjective: Pt says he is okay Denies back pain or left hip pain  Medications:   benztropine  1 mg Oral Daily   carvedilol  3.125 mg Oral BID WC   enoxaparin (LOVENOX) injection  40 mg Subcutaneous Q24H   feeding supplement  237 mL Oral BID BM   haloperidol  10 mg Oral BID   midodrine  10 mg Oral TID WC   mometasone-formoterol  2 puff Inhalation BID   montelukast  10 mg Oral Daily   multivitamin with minerals  1 tablet Oral Daily   nicotine  21 mg Transdermal Daily   pantoprazole  40 mg Oral Daily   tamsulosin  0.4 mg Oral QPC supper   traZODone  300 mg Oral QHS    Objective: Vital signs in last 24 hours: Patient Vitals for the past 24 hrs:  BP Temp Temp src Pulse Resp SpO2  01/16/23 1738 (!) 123/91 97.9 F (36.6 C) -- 81 16 97 %  01/16/23 1009 (!) 108/58 97.9 F (36.6 C) -- (!) 106 18 94 %  01/16/23 0253 121/85 97.9 F (36.6 C) Oral 81 18 95 %  01/15/23 1954 110/75 (!) 97.5 F (36.4 C) Oral 69 18 96 %      PHYSICAL EXAM:  General: Alert, cooperative, no distress, appears stated age.  Head: Normocephalic, without obvious abnormality, atraumatic. Eyes: Conjunctivae clear, anicteric sclerae. Pupils are equal ENT Nares normal. No drainage or sinus tenderness. Lips, mucosa, and tongue normal. No Thrush Neck: Supple, symmetrical, no adenopathy, thyroid: non tender no carotid bruit  and no JVD. Back: No CVA tenderness. Lungs: Clear to auscultation bilaterally. No Wheezing or Rhonchi. No rales. Heart: Regular rate and rhythm, no murmur, rub or gallop. Abdomen: Soft, non-tender,not distended. Bowel sounds normal. No masses Extremities: atraumatic, no cyanosis. No edema. No clubbing Moves lower extremities in bed well Skin: No rashes or lesions. Or bruising Lymph: Cervical, supraclavicular normal. Neurologic: Grossly non-focal  Lab Results    Latest Ref Rng & Units 01/14/2023    6:57 AM 01/13/2023    9:29 PM 09/22/2022    5:31 AM  CBC  WBC 4.0 - 10.5 K/uL 12.9  12.7  8.5   Hemoglobin 13.0 - 17.0 g/dL 16.1  09.6  9.4   Hematocrit 39.0 - 52.0 % 31.7  30.4  29.9   Platelets 150 - 400 K/uL 263  284  331        Latest Ref Rng & Units 01/16/2023    5:18 AM 01/15/2023    4:47 AM 01/14/2023    6:57 AM  CMP  Glucose 70 - 99 mg/dL 045  99  409   BUN 8 - 23 mg/dL 12  10  9    Creatinine 0.61 - 1.24 mg/dL 8.11  9.14  7.82   Sodium 135 - 145 mmol/L 138  133  138  Potassium 3.5 - 5.1 mmol/L 3.9  4.1  4.6   Chloride 98 - 111 mmol/L 105  104  110   CO2 22 - 32 mmol/L 25  21  21    Calcium 8.9 - 10.3 mg/dL 8.0  7.9  7.7       Microbiology:  Studies/Results: CT ABDOMEN PELVIS W CONTRAST Result Date: 01/16/2023 CLINICAL DATA:  Bacteremia. EXAM: CT ABDOMEN AND PELVIS WITH CONTRAST TECHNIQUE: Multidetector CT imaging of the abdomen and pelvis was performed using the standard protocol following bolus administration of intravenous contrast. RADIATION DOSE REDUCTION: This exam was performed according to the departmental dose-optimization program which includes automated exposure control, adjustment of the mA and/or kV according to patient size and/or use of iterative reconstruction technique. CONTRAST:  OMNIPAQUE IOHEXOL 300 MG/ML  SOLN COMPARISON:  None Available. FINDINGS: Lower chest: Minimal bilateral pleural effusions are noted with minimal adjacent subsegmental  atelectasis. Hepatobiliary: No focal liver abnormality is seen. No gallstones, gallbladder wall thickening, or biliary dilatation. Pancreas: Unremarkable. No pancreatic ductal dilatation or surrounding inflammatory changes. Spleen: Normal in size without focal abnormality. Adrenals/Urinary Tract: Adrenal glands appear normal. Right renal cyst is noted for which no further follow-up is required. No hydronephrosis or renal obstruction is noted. Mild urinary bladder distention is noted. Stomach/Bowel: Moderate gastric distention is noted. There is no evidence of bowel obstruction or inflammation. The appendix is unremarkable. Vascular/Lymphatic: No significant vascular findings are present. No enlarged abdominal or pelvic lymph nodes. Reproductive: Prostate is unremarkable. Other: No ascites or hernia is noted. Musculoskeletal: Severe compression deformity of T12 vertebral body is noted most consistent with old fracture. 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 Moderately depressed superior endplate fracture of L3 is noted which may be acute. Gas is noted in the L3-4 disc space and possible discitis and associated osteomyelitis cannot be excluded. Status post left total hip arthroplasty. Very large left hip joint effusion is noted and septic arthritis cannot be excluded. Status post surgical internal fixation of old proximal right femoral fracture. IMPRESSION: Moderately depressed superior endplate fracture of L3 is noted which may be acute. Gas is also noted in the L3-4 disc space and therefore possible discitis and associated osteomyelitis cannot be excluded at this level. Further evaluation with MRI of the lumbar spine with and without gadolinium is recommended. Also noted is very large left hip joint effusion surrounding left total hip arthroplasty. Septic arthritis cannot be excluded. Moderate gastric distention. Minimal bilateral pleural effusions are noted with adjacent subsegmental atelectasis.  These results will be called to the ordering clinician or representative by the Radiologist Assistant, and communication documented in the PACS or zVision Dashboard. Electronically Signed   By: Lupita Raider M.D.   On: 01/16/2023 15:52   EEG adult Result Date: 01/15/2023 Charlsie Quest, MD     01/15/2023  4:58 AM Patient Name: Adrian Gillespie MRN: [id number] Epilepsy Attending: Charlsie Quest Referring Physician/Provider: Kathrynn Running, MD Date: 01/14/2023 Duration: 31.06 mins Patient history: 72yo M with ams getting eeg to evaluate for seizure Level of alertness: Awake, asleep AEDs during EEG study: None Technical aspects: This EEG study was done with scalp electrodes positioned according to the 10-20 International system of electrode placement. Electrical activity was reviewed with band pass filter of 1-70Hz , sensitivity of 7 uV/mm, display speed of 11mm/sec with a 60Hz  notched filter applied as appropriate. EEG data were recorded continuously and digitally stored.  Video monitoring was available and reviewed as appropriate. Description: No clear posterior  dominant rhythm was seen. Sleep was characterized by sleep spindles (12 to 14 Hz), maximal frontocentral region. EEG showed continuous generalized predominantly 5  to 7 Hz theta slowing admixed with intermittent generalized 2-3Hz  delta slowing. Hyperventilation and photic stimulation were not performed.   ABNORMALITY - Continuous slow, generalized IMPRESSION: This study is suggestive of moderate diffuse encephalopathy. No seizures or epileptiform discharges were seen throughout the recording. Priyanka Annabelle Harman     Assessment/Plan: Encephalopathy- metabolic Improved   Sepsis secondary to ecoli Has ecoli bacteremia with no clear source Has urinary incontinence Check bladder scan= 351  abdominal imaging-CT  -- left hip joint fluid collection raises concern for septic joint , but movt of left hip not restricted in bed Did not see him  walk Agree with doing MRI of lumbar spine- will also do MRI pelvis  CT shows some worrisome changes ( discitis) in spine  Need MRI   Currently on ceftriaxone- duration and route will be decided once work up is completed  Bladder distension in CT With incontinence, r/o incomplete empyting     Fall- pain left shoulder- xray N   Anemia   Hyponatremia     ?COPD   Schizophrenia  Discussed the management with patient and care team  ID will follow him peripherally this weekend. Call if needed

## 2023-01-16 NOTE — TOC Progression Note (Signed)
Transition of Care [facility]) - Progression Note    Patient Details  Name: Adrian Gillespie MRN: [id number] Date of Birth: [birth year]  Transition of Care [facility]) CM/SW Contact  Adrian Fitch, RN Phone Number: 01/16/2023, 12:56 PM  Clinical Narrative:     Therapy recommends return to ALF and continue Therapy with [facility].   Met with patient and sister Adrian Gillespie at bedside.  Call placed to Adrian Gillespie at the [facility] while in the room.  With Patients current physical condition they are able to meet his needs. Adrian Gillespie is going to follow up with Adrian Gillespie and discuss the option of moving to Memory Care (currently there are no beds available)   Notified sister that if/when [facility] is not longer able to meet patients needs SNF could be a potenial option for LTC under his Medicaid  Per Adrian Gillespie at the [facility] Patient can return over the weekend, if they have the fl2 by today, MD aware, Sister states that she plans to transport at discharge.   Expected Discharge Plan: Assisted Living (with home health) Barriers to Discharge: Continued Medical Work up  Expected Discharge Plan and Services       Living arrangements for the past 2 months: Assisted Living Facility                             [facility] Agency: [facility] Date [facility] Agency Contacted: 01/15/23   Representative spoke with at [facility] Agency: Adrian Gillespie   Social Determinants of Health (SDOH) Interventions SDOH Screenings   Food Insecurity: No Food Insecurity (01/15/2023)  Housing: Low Risk  (01/15/2023)  Transportation Needs: No Transportation Needs (01/15/2023)  Utilities: Not At Risk (01/15/2023)  Financial Resource Strain: Medium Risk (06/26/2022)   Received from [facility], [facility]  Physical Activity: Sufficiently Active (06/26/2022)   Received from [facility], [facility]  Social Connections: Moderately Isolated (07/18/2020)   Received from [facility], [facility]  Stress: No Stress Concern Present  (06/26/2022)   Received from [facility], [facility]  Tobacco Use: Medium Risk (01/06/2023)   Received from [facility]  Health Literacy: High Risk (06/26/2022)   Received from [facility], [facility]    Readmission Risk Interventions    01/15/2023    9:35 AM 09/20/2022    3:20 PM  Readmission Risk Prevention Plan  Transportation Screening  Complete  PCP or Specialist Appt within 3-5 Days Complete Complete  HRI or Home Care Consult Complete Complete  Social Work Consult for Recovery Care Planning/Counseling Complete Complete  Palliative Care Screening Not Applicable Not Applicable  Medication Review Oceanographer)  Complete

## 2023-01-16 NOTE — Progress Notes (Addendum)
PROGRESS NOTE    Adrian Gillespie  BJY:[id number] DOB: 30-Oct-1950 DOA: 01/13/2023 PCP: Andres Shad, MD     Brief Narrative:   From admission h and p  Adrian Gillespie is a 72 y.o. male with medical history significant of HTN, HLD, COPD, asthma, GERD, depression, anxiety, schizophrenia, HBV, tobacco abuse, who presents with AMS, fever and fall.   Per report, pt fell twice in SNF today. It is unclear whether he hit his head or lost consciousness. He report right shoulder pain. Pt was noted to be more confused than usual. When EMS arrived he was found to be febrile and tachycardic.  His temperature is 102.2 in ED.  Patient received 2 L LR bolus in ED.  His mental status has gradually improved.  When I saw patient in ED, he is alert, knows his own name, knows that he is in hospital, confused about time.  He moves all extremities normally.  Answered most of questions appropriately.  He reports dry cough, denies chest pain, nausea, vomiting, diarrhea or abdominal pain.  No symptoms of UTI.  He denies shortness  of breath, but was found to have oxygen desaturation to 86% on room air, which improved to 97% on 2 L oxygen.  Patient states that he was on oxygen, but does not remember how much oxygen he was using at home.  Patient does not have neck pain, no neck rigidity on my examination.   Assessment & Plan:   Principal Problem:   SIRS (systemic inflammatory response syndrome) (HCC) Active Problems:   Fall at home, initial encounter   Left shoulder pain   Chronic obstructive pulmonary disease (HCC)   AF (paroxysmal atrial fibrillation) (HCC)   Hypokalemia   Hyponatremia   Electrolyte disturbance   Hypophosphatemia   Hypomagnesemia   HTN (hypertension)   Prolonged QT interval   Acute metabolic encephalopathy   Schizophrenia (HCC)   Hypotension   E coli bacteremia   Sepsis without acute organ dysfunction (HCC)  # Fever # E coli bacteremia Etiology of bacteremia is unclear. Urinalysis  not suggestive of infection and culture is negative. No abdominal pain or diarrhea to suggest intra-abdominal pathology though patient unable to provide accurate history.  - monitor blood cultures, repeat cultures ordered - continue ceftriaxone, per ID no oral cephalosporin at d/c, can do bactrim or cipro - CT abdomen/pelvis to evaluate for GI infection  # Seizure concern Report of fall day prior to admission, shaking, and confusion afterward that has now resolved. Discussed w/ neuro, hard to tell what's going on here. They advised mri (nothing acute) and eeg (no seizure-like activity).  - clinical monitoring  # Frequent falls Likely multifactorial - PT consulted, thinks he is close to his baseline, advising return to ALF  # Paroxysmal a-fib With rvr this morning, resolved. Chads2vasc 1 not anticoagulated, nor is he on a rate/rhythm control agent - discussed w/ cardiology advises trial of low-dose bb. Dig is an option but not a great one  # Hyponatremia Appears to be hypovolemic as has resolved  with hydration - monitor off fluids  # Hypokalemia # hypomagnesmia - replete and monitor  # QTC prolongation Likely 2/2 psych meds exacerbated by electrolyte abnormalities, has improved on most recent EKG - treat electrolyte abnormalities as above  # COPD Quiescent - home inhalers  # Hypotension Likely multifactorial. Meds likely contributing. On midodrine per pcp - continue midodrine  # Schizophrenia Ck wnl. Intermittent agitation, none today - continue home haldol - on monthly invega as  well - trazodone at bedtime  # Chronic pain Somnolent today after home oxy given - hold that    DVT prophylaxis: lovenox Code Status: full Family Communication: sister updated telephonically 12/19  Level of care: Med-Surg Status is: Inpatient Remains inpatient appropriate because: need for inpatient w/u    Consultants:  psych  Procedures: none  Antimicrobials:   ceftriaxone   Subjective: Alert, no complaints, feeling well  Objective: Vitals:   01/15/23 1616 01/15/23 1954 01/16/23 0253 01/16/23 1009  BP: 101/69 110/75 121/85 (!) 108/58  Pulse: 70 69 81 (!) 106  Resp: 18 18 18 18   Temp: 98 F (36.7 C) (!) 97.5 F (36.4 C) 97.9 F (36.6 C) 97.9 F (36.6 C)  TempSrc: Oral Oral Oral   SpO2: 98% 96% 95% 94%  Weight:        Intake/Output Summary (Last 24 hours) at 01/16/2023 1504 Last data filed at 01/16/2023 0100 Gross per 24 hour  Intake 578.5 ml  Output 800 ml  Net -221.5 ml    Filed Weights   01/13/23 2200  Weight: 78.9 kg    Examination:  General exam: Appears calm and comfortable, disheveled,   Respiratory system: Clear to auscultation. Respiratory effort normal. Cardiovascular system: S1 & S2 heard, RRR.   Gastrointestinal system: Abdomen is nondistended, soft and nontender.   Central nervous system: Alert and oriented to self. No focal neurological deficits. Extremities: Symmetric 5 x 5 power. Skin: No rashes, lesions or ulcers Psychiatry: calm    Data Reviewed: I have personally reviewed following labs and imaging studies  CBC: Recent Labs  Lab 01/13/23 2129 01/14/23 0657  WBC 12.7* 12.9*  NEUTROABS 11.6*  --   HGB 10.2* 10.5*  HCT 30.4* 31.7*  MCV 73.4* 74.8*  PLT 284 263   Basic Metabolic Panel: Recent Labs  Lab 01/13/23 2128 01/13/23 2129 01/13/23 2333 01/14/23 0657 01/15/23 0447 01/16/23 0518  NA  --  127* 128* 138 133* 138  K  --  3.4* 3.1* 4.6 4.1 3.9  CL  --  98 99 110 104 105  CO2  --  20* 21* 21* 21* 25  GLUCOSE  --  126* 148* 189* 99 115*  BUN  --  11 11 9 10 12   CREATININE  --  0.90 0.93 0.79 0.75 0.78  CALCIUM  --  7.8* 7.7* 7.7* 7.9* 8.0*  MG 1.5*  --  1.4* 2.6* 2.0  --   PHOS  --   --  2.0* 4.3 2.5  --    GFR: Estimated Creatinine Clearance: 84.7 mL/min (by C-G formula based on SCr of 0.78 mg/dL). Liver Function Tests: Recent Labs  Lab 01/13/23 2129  AST 22  ALT 18   ALKPHOS 87  BILITOT 1.4*  PROT 6.3*  ALBUMIN 2.4*   No results for input(s): "LIPASE", "AMYLASE" in the last 168 hours. No results for input(s): "AMMONIA" in the last 168 hours. Coagulation Profile: Recent Labs  Lab 01/13/23 2129  INR 1.3*   Cardiac Enzymes: Recent Labs  Lab 01/13/23 2128  CKTOTAL 33*   BNP (last 3 results) No results for input(s): "PROBNP" in the last 8760 hours. HbA1C: No results for input(s): "HGBA1C" in the last 72 hours. CBG: No results for input(s): "GLUCAP" in the last 168 hours. Lipid Profile: No results for input(s): "CHOL", "HDL", "LDLCALC", "TRIG", "CHOLHDL", "LDLDIRECT" in the last 72 hours. Thyroid Function Tests: Recent Labs    01/14/23 0657  TSH 1.222   Anemia Panel: No results for input(s): "VITAMINB12", "  FOLATE", "FERRITIN", "TIBC", "IRON", "RETICCTPCT" in the last 72 hours. Urine analysis:    Component Value Date/Time   COLORURINE YELLOW (A) 01/13/2023 2130   APPEARANCEUR CLEAR (A) 01/13/2023 2130   LABSPEC 1.010 01/13/2023 2130   PHURINE 6.0 01/13/2023 2130   GLUCOSEU NEGATIVE 01/13/2023 2130   HGBUR SMALL (A) 01/13/2023 2130   BILIRUBINUR NEGATIVE 01/13/2023 2130   KETONESUR NEGATIVE 01/13/2023 2130   PROTEINUR NEGATIVE 01/13/2023 2130   NITRITE NEGATIVE 01/13/2023 2130   LEUKOCYTESUR NEGATIVE 01/13/2023 2130   Sepsis Labs: @LABRCNTIP (procalcitonin:4,lacticidven:4)  ) Recent Results (from the past 240 hours)  Resp panel by RT-PCR (RSV, Flu A&B, Covid) Anterior Nasal Swab     Status: None   Collection Time: 01/13/23  9:29 PM   Specimen: Anterior Nasal Swab  Result Value Ref Range Status   SARS Coronavirus 2 by RT PCR NEGATIVE NEGATIVE Final    Comment: (NOTE) SARS-CoV-2 target nucleic acids are NOT DETECTED.  The SARS-CoV-2 RNA is generally detectable in upper respiratory specimens during the acute phase of infection. The lowest concentration of SARS-CoV-2 viral copies this assay can detect is 138 copies/mL. A  negative result does not preclude SARS-Cov-2 infection and should not be used as the sole basis for treatment or other patient management decisions. A negative result may occur with  improper specimen collection/handling, submission of specimen other than nasopharyngeal swab, presence of viral mutation(s) within the areas targeted by this assay, and inadequate number of viral copies(<138 copies/mL). A negative result must be combined with clinical observations, patient history, and epidemiological information. The expected result is Negative.  Fact Sheet for Patients:  BloggerCourse.com  Fact Sheet for Healthcare Providers:  SeriousBroker.it  This test is no t yet approved or cleared by the [place] FDA and  has been authorized for detection and/or diagnosis of SARS-CoV-2 by FDA under an Emergency Use Authorization (EUA). This EUA will remain  in effect (meaning this test can be used) for the duration of the COVID-19 declaration under Section 564(b)(1) of the Act, 21 U.S.C.section 360bbb-3(b)(1), unless the authorization is terminated  or revoked sooner.       Influenza A by PCR NEGATIVE NEGATIVE Final   Influenza B by PCR NEGATIVE NEGATIVE Final    Comment: (NOTE) The Xpert Xpress SARS-CoV-2/FLU/RSV plus assay is intended as an aid in the diagnosis of influenza from Nasopharyngeal swab specimens and should not be used as a sole basis for treatment. Nasal washings and aspirates are unacceptable for Xpert Xpress SARS-CoV-2/FLU/RSV testing.  Fact Sheet for Patients: BloggerCourse.com  Fact Sheet for Healthcare Providers: SeriousBroker.it  This test is not yet approved or cleared by the [place] FDA and has been authorized for detection and/or diagnosis of SARS-CoV-2 by FDA under an Emergency Use Authorization (EUA). This EUA will remain in effect (meaning this test can  be used) for the duration of the COVID-19 declaration under Section 564(b)(1) of the Act, 21 U.S.C. section 360bbb-3(b)(1), unless the authorization is terminated or revoked.     Resp Syncytial Virus by PCR NEGATIVE NEGATIVE Final    Comment: (NOTE) Fact Sheet for Patients: BloggerCourse.com  Fact Sheet for Healthcare Providers: SeriousBroker.it  This test is not yet approved or cleared by the [place] FDA and has been authorized for detection and/or diagnosis of SARS-CoV-2 by FDA under an Emergency Use Authorization (EUA). This EUA will remain in effect (meaning this test can be used) for the duration of the COVID-19 declaration under Section 564(b)(1) of the Act, 21 U.S.C. section 360bbb-3(b)(1),  unless the authorization is terminated or revoked.  Performed at [facility], [address] [place], [place] [address]   Blood Culture (routine x 2)     Status: Abnormal   Collection Time: 01/13/23  9:29 PM   Specimen: BLOOD  Result Value Ref Range Status   Specimen Description   Final    BLOOD BLOOD LEFT ARM Performed at [facility], [address] [place], [place] [address]    Special Requests   Final    BOTTLES DRAWN AEROBIC AND ANAEROBIC Blood Culture adequate volume Performed at [facility], [address] [place], [place] [address]    Culture  Setup Time   Final    Organism ID to follow ANAEROBIC BOTTLE ONLY GRAM NEGATIVE RODS CRITICAL RESULT CALLED TO, READ BACK BY AND VERIFIED WITH: TREY GREENWOOD 01/14/23 1142 KLW Performed at [facility], [address] [place], [place] [address]    Culture ESCHERICHIA COLI (A)  Final   Report Status 01/16/2023 FINAL  Final   Organism ID, Bacteria ESCHERICHIA COLI  Final   Organism ID, Bacteria ESCHERICHIA COLI  Final      Susceptibility   Escherichia coli - KIRBY BAUER*    CEFAZOLIN INTERMEDIATE Intermediate    Escherichia  coli - MIC*    AMPICILLIN >=32 RESISTANT Resistant     CEFEPIME <=0.12 SENSITIVE Sensitive     CEFTAZIDIME <=1 SENSITIVE Sensitive     CEFTRIAXONE <=0.25 SENSITIVE Sensitive     CIPROFLOXACIN <=0.25 SENSITIVE Sensitive     GENTAMICIN <=1 SENSITIVE Sensitive     IMIPENEM <=0.25 SENSITIVE Sensitive     TRIMETH/SULFA <=20 SENSITIVE Sensitive     AMPICILLIN/SULBACTAM 16 INTERMEDIATE Intermediate     PIP/TAZO <=4 SENSITIVE Sensitive ug/mL    * ESCHERICHIA COLI    ESCHERICHIA COLI  Urine Culture     Status: None   Collection Time: 01/13/23  9:29 PM   Specimen: Urine, Clean Catch  Result Value Ref Range Status   Specimen Description   Final    URINE, CLEAN CATCH Performed at [facility], [address], [place], [place] [address]    Special Requests   Final    NONE Performed at [facility], [address] [place] [place], [place] [address]    Culture   Final    NO GROWTH Performed at [facility], [address] [place], [place] [address]    Report Status 01/15/2023 FINAL  Final  Blood Culture ID Panel (Reflexed)     Status: Abnormal   Collection Time: 01/13/23  9:29 PM  Result Value Ref Range Status   Enterococcus faecalis NOT DETECTED NOT DETECTED Final   Enterococcus Faecium NOT DETECTED NOT DETECTED Final   Listeria monocytogenes NOT DETECTED NOT DETECTED Final   Staphylococcus species NOT DETECTED NOT DETECTED Final   Staphylococcus aureus (BCID) NOT DETECTED NOT DETECTED Final   Staphylococcus epidermidis NOT DETECTED NOT DETECTED Final   Staphylococcus lugdunensis NOT DETECTED NOT DETECTED Final   Streptococcus species NOT DETECTED NOT DETECTED Final   Streptococcus agalactiae NOT DETECTED NOT DETECTED Final   Streptococcus pneumoniae NOT DETECTED NOT DETECTED Final   Streptococcus pyogenes NOT DETECTED NOT DETECTED Final   A.calcoaceticus-baumannii NOT DETECTED NOT DETECTED Final   Bacteroides fragilis NOT DETECTED NOT DETECTED Final    Enterobacterales DETECTED (A) NOT DETECTED Final    Comment: Enterobacterales represent a large order of gram negative bacteria, not a single organism. CRITICAL RESULT CALLED TO, READ BACK BY  AND VERIFIED WITH: TREY GREENWOOD 01/14/23 1142 KLW    Enterobacter cloacae complex NOT DETECTED NOT DETECTED Final   Escherichia coli DETECTED (A) NOT DETECTED Final    Comment: CRITICAL RESULT CALLED TO, READ BACK BY AND VERIFIED WITH: TREY GREENWOOD 01/14/23 1142 KLW    Klebsiella aerogenes NOT DETECTED NOT DETECTED Final   Klebsiella oxytoca NOT DETECTED NOT DETECTED Final   Klebsiella pneumoniae NOT DETECTED NOT DETECTED Final   Proteus species NOT DETECTED NOT DETECTED Final   Salmonella species NOT DETECTED NOT DETECTED Final   Serratia marcescens NOT DETECTED NOT DETECTED Final   Haemophilus influenzae NOT DETECTED NOT DETECTED Final   Neisseria meningitidis NOT DETECTED NOT DETECTED Final   Pseudomonas aeruginosa NOT DETECTED NOT DETECTED Final   Stenotrophomonas maltophilia NOT DETECTED NOT DETECTED Final   Candida albicans NOT DETECTED NOT DETECTED Final   Candida auris NOT DETECTED NOT DETECTED Final   Candida glabrata NOT DETECTED NOT DETECTED Final   Candida krusei NOT DETECTED NOT DETECTED Final   Candida parapsilosis NOT DETECTED NOT DETECTED Final   Candida tropicalis NOT DETECTED NOT DETECTED Final   Cryptococcus neoformans/gattii NOT DETECTED NOT DETECTED Final   CTX-M ESBL NOT DETECTED NOT DETECTED Final   Carbapenem resistance IMP NOT DETECTED NOT DETECTED Final   Carbapenem resistance KPC NOT DETECTED NOT DETECTED Final   Carbapenem resistance NDM NOT DETECTED NOT DETECTED Final   Carbapenem resist OXA 48 LIKE NOT DETECTED NOT DETECTED Final   Carbapenem resistance VIM NOT DETECTED NOT DETECTED Final    Comment: Performed at [facility], [address] [place], [place] [address]  Blood Culture (routine x 2)     Status: None (Preliminary result)   Collection  Time: 01/13/23 11:06 PM   Specimen: BLOOD  Result Value Ref Range Status   Specimen Description BLOOD RIGHT ARM  Final   Special Requests   Final    BOTTLES DRAWN AEROBIC AND ANAEROBIC Blood Culture adequate volume   Culture   Final    NO GROWTH 2 DAYS Performed at [facility], [address] [place], [place] [address]    Report Status PENDING  Incomplete  Respiratory (~20 pathogens) panel by PCR     Status: None   Collection Time: 01/14/23 12:15 PM   Specimen: Nasopharyngeal Swab; Respiratory  Result Value Ref Range Status   Adenovirus NOT DETECTED NOT DETECTED Final   Coronavirus 229E NOT DETECTED NOT DETECTED Final    Comment: (NOTE) The Coronavirus on the Respiratory Panel, DOES NOT test for the novel  Coronavirus (2019 nCoV)    Coronavirus HKU1 NOT DETECTED NOT DETECTED Final   Coronavirus NL63 NOT DETECTED NOT DETECTED Final   Coronavirus OC43 NOT DETECTED NOT DETECTED Final   Metapneumovirus NOT DETECTED NOT DETECTED Final   Rhinovirus / Enterovirus NOT DETECTED NOT DETECTED Final   Influenza A NOT DETECTED NOT DETECTED Final   Influenza B NOT DETECTED NOT DETECTED Final   Parainfluenza Virus 1 NOT DETECTED NOT DETECTED Final   Parainfluenza Virus 2 NOT DETECTED NOT DETECTED Final   Parainfluenza Virus 3 NOT DETECTED NOT DETECTED Final   Parainfluenza Virus 4 NOT DETECTED NOT DETECTED Final   Respiratory Syncytial Virus NOT DETECTED NOT DETECTED Final   Bordetella pertussis NOT DETECTED NOT DETECTED Final   Bordetella Parapertussis NOT DETECTED NOT DETECTED Final   Chlamydophila pneumoniae NOT DETECTED NOT DETECTED Final   Mycoplasma pneumoniae NOT DETECTED NOT DETECTED Final    Comment: Performed at [facility], [address], [place], [place] [address]  Culture, blood (Routine X 2) w Reflex to ID Panel     Status: None (Preliminary result)   Collection Time: 01/15/23  9:14 AM   Specimen: Left Antecubital; Blood  Result Value Ref Range Status    Specimen Description LEFT ANTECUBITAL  Final   Special Requests   Final    BLOOD BOTTLES DRAWN AEROBIC AND ANAEROBIC Blood Culture adequate volume   Culture   Final    NO GROWTH < 24 HOURS Performed at [facility], [address] [place], [place] [address]    Report Status PENDING  Incomplete  Culture, blood (Routine X 2) w Reflex to ID Panel     Status: None (Preliminary result)   Collection Time: 01/15/23  9:15 AM   Specimen: BLOOD RIGHT HAND  Result Value Ref Range Status   Specimen Description BLOOD RIGHT HAND  Final   Special Requests   Final    BLOOD BOTTLES DRAWN AEROBIC AND ANAEROBIC Blood Culture adequate volume   Culture   Final    NO GROWTH < 24 HOURS Performed at [facility], [address] [place], [place] [address]    Report Status PENDING  Incomplete         Radiology Studies: EEG adult Result Date: 01/15/2023 Charlsie Quest, MD     01/15/2023  4:58 AM Patient Name: Adrian Gillespie MRN: [id number] Epilepsy Attending: Charlsie Quest Referring Physician/Provider: Kathrynn Running, MD Date: 01/14/2023 Duration: 31.06 mins Patient history: 72yo M with ams getting eeg to evaluate for seizure Level of alertness: Awake, asleep AEDs during EEG study: None Technical aspects: This EEG study was done with scalp electrodes positioned according to the 10-20 International system of electrode placement. Electrical activity was reviewed with band pass filter of 1-70Hz , sensitivity of 7 uV/mm, display speed of 59mm/sec with a 60Hz  notched filter applied as appropriate. EEG data were recorded continuously and digitally stored.  Video monitoring was available and reviewed as appropriate. Description: No clear posterior dominant rhythm was seen. Sleep was characterized by sleep spindles (12 to 14 Hz), maximal frontocentral region. EEG showed continuous generalized predominantly 5  to 7 Hz theta slowing admixed with intermittent generalized 2-3Hz  delta slowing.  Hyperventilation and photic stimulation were not performed.   ABNORMALITY - Continuous slow, generalized IMPRESSION: This study is suggestive of moderate diffuse encephalopathy. No seizures or epileptiform discharges were seen throughout the recording. Priyanka Annabelle Harman        Scheduled Meds:  benztropine  1 mg Oral Daily   enoxaparin (LOVENOX) injection  40 mg Subcutaneous Q24H   feeding supplement  237 mL Oral BID BM   haloperidol  10 mg Oral BID   midodrine  10 mg Oral TID WC   mometasone-formoterol  2 puff Inhalation BID   montelukast  10 mg Oral Daily   multivitamin with minerals  1 tablet Oral Daily   nicotine  21 mg Transdermal Daily   pantoprazole  40 mg Oral Daily   tamsulosin  0.4 mg Oral QPC supper   traZODone  300 mg Oral QHS   Continuous Infusions:  cefTRIAXone (ROCEPHIN)  IV 2 g (01/16/23 1219)     LOS: 3 days     Silvano Bilis, MD [facility]   If 7PM-7AM, please contact night-coverage [email or web address] Password [facility] 01/16/2023, 3:04 PM

## 2023-01-16 NOTE — Consult Note (Signed)
PHARMACY CONSULT NOTE - ELECTROLYTES  Pharmacy Consult for Electrolyte Monitoring and Replacement   Recent Labs: Weight: 78.9 kg (174 lb) Estimated Creatinine Clearance: 84.7 mL/min (by C-G formula based on SCr of 0.78 mg/dL).  Potassium (mmol/L)  Date Value  01/16/2023 3.9   Magnesium (mg/dL)  Date Value  44/03/4740 2.0   Calcium (mg/dL)  Date Value  59/56/3875 8.0 (L)   Albumin (g/dL)  Date Value  64/33/2951 2.4 (L)   Phosphorus (mg/dL)  Date Value  88/41/6606 2.5   Sodium (mmol/L)  Date Value  01/16/2023 138   Corrected Ca: 8.98 mg/dL  Assessment  Adrian Gillespie is a 72 y.o. male presenting with altered mental status. PMH significant for hypertension, atrial fibrillation on Eliquis, COPD, GERD, and schizophrenia. Pharmacy has been consulted to monitor and replace electrolytes.  Diet: reg MIVF: none  Goal of Therapy: Electrolytes WNL  Plan:  No electrolyte replacement indicated at this time  Check BMP, Mg, Phos with AM labs  Thank you for allowing pharmacy to be a part of this patient's care.  Katreena Schupp A, PharmD 01/16/2023 10:34 AM

## 2023-01-17 ENCOUNTER — Encounter: Payer: Self-pay | Admitting: Internal Medicine

## 2023-01-17 ENCOUNTER — Inpatient Hospital Stay: Payer: Medicare Other

## 2023-01-17 DIAGNOSIS — R6510 Systemic inflammatory response syndrome (SIRS) of non-infectious origin without acute organ dysfunction: Secondary | ICD-10-CM | POA: Diagnosis not present

## 2023-01-17 LAB — BASIC METABOLIC PANEL
Anion gap: 7 (ref 5–15)
BUN: 11 mg/dL (ref 8–23)
CO2: 24 mmol/L (ref 22–32)
Calcium: 8.2 mg/dL — ABNORMAL LOW (ref 8.9–10.3)
Chloride: 105 mmol/L (ref 98–111)
Creatinine, Ser: 0.69 mg/dL (ref 0.61–1.24)
GFR, Estimated: >60 mL/min (ref >60)
Glucose, Bld: 106 mg/dL — ABNORMAL HIGH (ref 70–99)
Potassium: 3.8 mmol/L (ref 3.5–5.1)
Sodium: 136 mmol/L (ref 135–145)

## 2023-01-17 LAB — MAGNESIUM: Magnesium: 1.9 mg/dL (ref 1.7–2.4)

## 2023-01-17 LAB — PHOSPHORUS: Phosphorus: 3.6 mg/dL (ref 2.5–4.6)

## 2023-01-17 MED ORDER — GADOBUTROL 1 MMOL/ML IV SOLN
8.0000 mL | Freq: Once | INTRAVENOUS | Status: AC | PRN
  Administered 2023-01-17: 8 mL via INTRAVENOUS

## 2023-01-17 NOTE — Consult Note (Signed)
PHARMACY CONSULT NOTE - ELECTROLYTES  Pharmacy Consult for Electrolyte Monitoring and Replacement   Recent Labs: Weight: 78.9 kg (174 lb) Estimated Creatinine Clearance: 84.7 mL/min (by C-G formula based on SCr of 0.69 mg/dL).  Potassium (mmol/L)  Date Value  01/17/2023 3.8   Magnesium (mg/dL)  Date Value  14/78/2956 1.9   Calcium (mg/dL)  Date Value  21/30/8657 8.2 (L)   Albumin (g/dL)  Date Value  84/69/6295 2.4 (L)   Phosphorus (mg/dL)  Date Value  28/41/3244 3.6   Sodium (mmol/L)  Date Value  01/17/2023 136   Assessment  Adrian Gillespie is a 72 y.o. male presenting with altered mental status. PMH significant for hypertension, atrial fibrillation on Eliquis, COPD, GERD, and schizophrenia. Pharmacy has been consulted to monitor and replace electrolytes.  Diet: Currently NPO MIVF: N/A  Goal of Therapy: Electrolytes WNL  Plan:  Electrolytes stable and not requiring replacement Will discontinue electrolyte consult at this time. Pharmacy will monitor peripherally  Thank you for allowing pharmacy to be a part of this patient's care.  Tressie Ellis 01/17/2023 8:14 AM

## 2023-01-17 NOTE — Plan of Care (Signed)

## 2023-01-17 NOTE — Consult Note (Signed)
ORTHOPAEDIC CONSULTATION  REQUESTING PHYSICIAN: Wouk, Wilfred Curtis, MD  Chief Complaint:   Left hip effusion inpatient with E. coli bacteremia  History of Present Illness: Adrian Gillespie is a 72 y.o. male  with medical history significant of HTN, HLD, COPD, asthma, GERD, depression, anxiety, schizophrenia, HBV, tobacco abuse, who presents with AMS, fever who presented after falling twice at his skilled nursing facility.  There was a question about increased confusion and he was found to be febrile and was admitted to the hospital and worked up for infection and found to be bacteremic with E. coli.  A CT abdomen pelvis was performed to evaluate for GI infection and a left hip effusion and changes within the thoracic and lumbar spine were noted.  Orthopedics was consulted to evaluate the left hip.  Patient denied any pre-existing left hip pain prior to this admission.  Patient is oriented to self and that he is at a hospital and recognizes me from outpatient follow-up from his previous right hip surgery.  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)    HLD (hyperlipidemia)    HTN (hypertension)    Schizophrenia (HCC)    Past Surgical History:  Procedure Laterality Date   INTRAMEDULLARY (IM) NAIL INTERTROCHANTERIC Right 09/19/2022   Procedure: INTRAMEDULLARY (IM) NAIL INTERTROCHANTERIC;  Surgeon: Reinaldo Berber, MD;  Location: [facility] ORS;  Service: Orthopedics;  Laterality: Right;   Social History   Socioeconomic History   Marital status: Single    Spouse name: Not on file   Number of children: Not on file   Years of education: Not on file   Highest education level: Not on file  Occupational History   Not on file   Tobacco Use   Smoking status: Every Day    Current packs/day: 0.50    Types: Cigarettes   Smokeless tobacco: Never   Tobacco comments:    quit 1 month ago  Vaping Use   Vaping status: Never Used  Substance and Sexual Activity   Alcohol use: Not Currently   Drug use: Never   Sexual activity: Not on file  Other Topics Concern   Not on file  Social History Narrative   Not on file   Social Drivers of Health   Financial Resource Strain: Medium Risk (06/26/2022)   Received from [facility], [facility]   Overall Financial Resource Strain (CARDIA)    Difficulty of Paying Living Expenses: Somewhat hard  Food Insecurity: No Food Insecurity (01/15/2023)   Hunger Vital Sign    Worried About Running Out of Food in the Last Year: Never true    Ran Out of Food in the Last Year: Never true  Transportation Needs: No Transportation Needs (01/15/2023)   PRAPARE - Administrator, Civil Service (Medical): No    Lack of Transportation (Non-Medical): No  Physical Activity: Sufficiently Active (06/26/2022)   Received from [facility], [facility]   Exercise Vital Sign    Days of Exercise per Week: 6 days    Minutes of Exercise per Session: 60 min  Stress: No Stress Concern Present (06/26/2022)   Received from [facility], [facility] Occupational Health - Occupational Stress Questionnaire  Feeling of Stress : Only a little  Social Connections: Moderately Isolated (07/18/2020)   Received from [facility], [facility]   Social Connection and Isolation Panel [NHANES]    Frequency of Communication with Friends and Family: More than three times a week    Frequency of Social Gatherings with Friends and Family: Never    Attends Religious Services: Never    Database administrator or Organizations: No    Attends Engineer, structural: Never    Marital Status: Living with partner   Family History  Problem Relation Age of  Onset   Arthritis/Rheumatoid Mother    Allergies  Allergen Reactions   Shellfish-Derived Products Hives   Prior to Admission medications   Medication Sig Start Date End Date Taking? Authorizing Provider  acetaminophen (TYLENOL) 325 MG tablet Take 2 tablets (650 mg total) by mouth every 6 (six) hours as needed for mild pain or moderate pain. 02/07/20  Yes Wieting, Gerlene Burdock, MD  ADVAIR [facility] [phone] MCG/ACT inhaler Inhale 2 puffs into the lungs 2 (two) times daily. 01/10/20  Yes [provider]  albuterol (VENTOLIN HFA) 108 (90 Base) MCG/ACT inhaler Inhale 2 puffs into the lungs every 6 (six) hours as needed for shortness of breath or wheezing. 04/05/19  Yes Dhungel, Nishant, MD  benztropine (COGENTIN) 1 MG tablet Take 1 mg by mouth daily.   Yes [provider]  BEVESPI AEROSPHERE 9-4.8 MCG/ACT AERO Inhale 2 puffs into the lungs at bedtime.  03/31/19  Yes [provider]  haloperidol (HALDOL) 10 MG tablet Take 10 mg by mouth 2 (two) times daily.   Yes [provider]  HYDROcodone-acetaminophen (NORCO/VICODIN) 5-325 MG tablet Take 1-2 tablets by mouth every 4 (four) hours as needed for moderate pain (pain score 4-6). 09/20/22  Yes Dedra Skeens, PA-C  hydrOXYzine (VISTARIL) 50 MG capsule Take 50 mg by mouth 2 (two) times daily as needed for anxiety.  04/14/19  Yes [provider]  INVEGA SUSTENNA 234 MG/1.5ML SUSY injection Inject 234 mg into the muscle every 30 (thirty) days. 04/18/19  Yes [provider]  Multiple Vitamin (MULTIVITAMIN WITH MINERALS) TABS tablet Take 1 tablet by mouth daily. 09/24/22  Yes Arnetha Courser, MD  naloxone [facility]) nasal spray 4 mg/0.1 mL One spray in nostril for opioid reversal if unresponsive 02/07/20  Yes Wieting, Richard, MD  nicotine (NICODERM CQ - DOSED IN MG/24 HOURS) 21 mg/24hr patch Place 1 patch (21 mg total) onto the skin daily. 05/26/19  Yes Dhungel, Nishant, MD  omeprazole (PRILOSEC) 20 MG capsule Take 20 mg by mouth  daily.   Yes [provider]  tamsulosin (FLOMAX) 0.4 MG CAPS capsule Take 1 capsule (0.4 mg total) by mouth daily after supper. 09/23/22  Yes Arnetha Courser, MD  tiotropium (SPIRIVA HANDIHALER) 18 MCG inhalation capsule Place 1 capsule (18 mcg total) into inhaler and inhale daily. 04/05/19  Yes Dhungel, Nishant, MD  traZODone (DESYREL) 100 MG tablet Take 300 mg by mouth at bedtime.   Yes [provider]  enoxaparin (LOVENOX) 40 MG/0.4ML injection Inject 0.4 mLs (40 mg total) into the skin daily for 14 days. 09/24/22 10/08/22  Arnetha Courser, MD  Fe Fum-Vit C-Vit B12-FA (TRIGELS-F FORTE) CAPS capsule Take 1 capsule by mouth 2 (two) times daily. Patient not taking: Reported on 01/14/2023 09/23/22   Arnetha Courser, MD  feeding supplement, ENSURE ENLIVE, (ENSURE ENLIVE) LIQD Take 237 mLs by mouth 2 (two) times daily between meals. 05/26/19   Eddie North, MD  magnesium hydroxide (MILK OF MAGNESIA) 400 MG/5ML suspension Take 30 mLs by mouth daily as needed for mild constipation. Patient not taking: Reported on 01/14/2023 09/23/22   Arnetha Courser, MD  montelukast (SINGULAIR) 10 MG tablet Take 10 mg by mouth daily. Patient not taking: Reported on 01/14/2023 11/25/19   [provider]  polyethylene glycol (MIRALAX / GLYCOLAX) 17 g packet Take 17 g by mouth daily. 02/07/20   Alford Highland, MD   CT ABDOMEN PELVIS W CONTRAST Result Date: 01/16/2023 CLINICAL DATA:  Bacteremia. EXAM: CT ABDOMEN AND PELVIS WITH CONTRAST TECHNIQUE: Multidetector CT imaging of the abdomen and pelvis was performed using the standard protocol following bolus administration of intravenous contrast. RADIATION DOSE REDUCTION: This exam was performed according to the departmental dose-optimization program which includes automated exposure control, adjustment of the mA and/or kV according to patient size and/or use of iterative reconstruction technique. CONTRAST:  OMNIPAQUE IOHEXOL 300 MG/ML  SOLN COMPARISON:   None Available. FINDINGS: Lower chest: Minimal bilateral pleural effusions are noted with minimal adjacent subsegmental atelectasis. Hepatobiliary: No focal liver abnormality is seen. No gallstones, gallbladder wall thickening, or biliary dilatation. Pancreas: Unremarkable. No pancreatic ductal dilatation or surrounding inflammatory changes. Spleen: Normal in size without focal abnormality. Adrenals/Urinary Tract: Adrenal glands appear normal. Right renal cyst is noted for which no further follow-up is required. No hydronephrosis or renal obstruction is noted. Mild urinary bladder distention is noted. Stomach/Bowel: Moderate gastric distention is noted. There is no evidence of bowel obstruction or inflammation. The appendix is unremarkable. Vascular/Lymphatic: No significant vascular findings are present. No enlarged abdominal or pelvic lymph nodes. Reproductive: Prostate is unremarkable. Other: No ascites or hernia is noted. Musculoskeletal: Severe compression deformity of T12 vertebral body is noted most consistent with old fracture. 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 Moderately depressed superior endplate fracture of L3 is noted which may be acute. Gas is noted in the L3-4 disc space and possible discitis and associated osteomyelitis cannot be excluded. Status post left total hip arthroplasty. Very large left hip joint effusion is noted and septic arthritis cannot be excluded. Status post surgical internal fixation of old proximal right femoral fracture. IMPRESSION: Moderately depressed superior endplate fracture of L3 is noted which may be acute. Gas is also noted in the L3-4 disc space and therefore possible discitis and associated osteomyelitis cannot be excluded at this level. Further evaluation with MRI of the lumbar spine with and without gadolinium is recommended. Also noted is very large left hip joint effusion surrounding left total hip arthroplasty. Septic arthritis cannot be excluded.  Moderate gastric distention. Minimal bilateral pleural effusions are noted with adjacent subsegmental atelectasis. These results will be called to the ordering clinician or representative by the Radiologist Assistant, and communication documented in the PACS or zVision Dashboard. Electronically Signed   By: Lupita Raider M.D.   On: 01/16/2023 15:52    Positive ROS: All other systems have been reviewed and were otherwise negative with the exception of those mentioned in the HPI and as above.  Physical Exam: General:  Alert, no acute distress Psychiatric:  Patient oriented to self and hospital Cardiovascular:  No pedal edema Respiratory:  No wheezing, non-labored breathing GI:  Abdomen is soft and non-tender Skin:  No lesions in the area of chief complaint Neurologic:  Sensation intact distally Lymphatic:  No axillary or cervical lymphadenopathy  Orthopedic Exam:  Left lower extremity There is a well-healed surgical incision over the posterior lateral hip with no erythema minimal increased  warmth to the area no ecchymosis There is some palpable fluctuance within the soft tissues diffusely across the entire area of the posterior hip and gluteal muscles No drainage noted or any sinus tracts Neurovascular intact down the lower extremity with no tenderness to palpation able dorsiflex and plantarflex his foot and toes within its intact dorsalis pedis pulse  X-rays:  CT scan reviewed which shows a left hip effusion extending into the soft tissues with hydrodissection within the gluteal muscles.  Agree with radiologist interpretation.  No evidence of periprosthetic fracture or osteomyelitis around the hip stem but there is limitation given the metal artifact.  Assessment: Left hip effusion  Plan: I have reviewed the clinical area for findings with the patient and discussed the case with the medical team.  Given the finding of the effusion and no current source other than potentially the spine I  am recommending for interventional radiology aspiration of the left hip with cell count, Gram stain and culture.  The CT scan is concerning that this could be an infected left prosthetic hip.  Should the results be positive for infection we would plan to take the patient for surgery and I will tentatively put him on the schedule for tomorrow pending IR aspiration today.  Keep him n.p.o. after midnight tonight and hold any chemical anticoagulation after midnight tonight.  I will discuss this with the patient's sister and power of attorney [name] over the phone.    Reinaldo Berber MD  Beeper #:  [phone]  01/17/2023 9:25 AM

## 2023-01-17 NOTE — Progress Notes (Signed)
PROGRESS NOTE    Adrian Gillespie  NAT:[id number] DOB: 15-Feb-1950 DOA: 01/13/2023 PCP: Adrian Shad, MD     Brief Narrative:   From admission h and p  Adrian Gillespie is a 72 y.o. male with medical history significant of HTN, HLD, COPD, asthma, GERD, depression, anxiety, schizophrenia, HBV, tobacco abuse, who presents with AMS, fever and fall.   Per report, pt fell twice in SNF today. It is unclear whether he hit his head or lost consciousness. He report right shoulder pain. Pt was noted to be more confused than usual. When EMS arrived he was found to be febrile and tachycardic.  His temperature is 102.2 in ED.  Patient received 2 L LR bolus in ED.  His mental status has gradually improved.  When I saw patient in ED, he is alert, knows his own name, knows that he is in hospital, confused about time.  He moves all extremities normally.  Answered most of questions appropriately.  He reports dry cough, denies chest pain, nausea, vomiting, diarrhea or abdominal pain.  No symptoms of UTI.  He denies shortness  of breath, but was found to have oxygen desaturation to 86% on room air, which improved to 97% on 2 L oxygen.  Patient states that he was on oxygen, but does not remember how much oxygen he was using at home.  Patient does not have neck pain, no neck rigidity on my examination.   Assessment & Plan:   Principal Problem:   SIRS (systemic inflammatory response syndrome) (HCC) Active Problems:   Fall at home, initial encounter   Left shoulder pain   Chronic obstructive pulmonary disease (HCC)   AF (paroxysmal atrial fibrillation) (HCC)   Hypokalemia   Hyponatremia   Electrolyte disturbance   Hypophosphatemia   Hypomagnesemia   HTN (hypertension)   Prolonged QT interval   Acute metabolic encephalopathy   Schizophrenia (HCC)   Hypotension   E coli bacteremia   Sepsis without acute organ dysfunction (HCC)  # Fever # E coli bacteremia Etiology of bacteremia is unclear. Urinalysis  not suggestive of infection and culture is negative. No abdominal pain or diarrhea to suggest intra-abdominal pathology though patient unable to provide accurate history. Not retaining urine - PVR is 0. CT abdomen no intra-abdominal infection - monitor blood cultures, repeat cultures ordered, ngtd - continue ceftriaxone, per ID no oral cephalosporin at d/c, can do bactrim or cipro at d/c - CT abdomen/pelvis to evaluate for GI infection  # Left hip effusion Large, incidental on CT. May be chronic related to left hip replacement but given infection need to r/o septic arthritis - IR consulted for aspiration, they will perform that tomorrow - ortho following, will need surgery if signs of infection  # Subacute compression fracture On MRI. CT showed possible signs infection but none seen on mri  # Seizure concern Report of fall day prior to admission, shaking, and confusion afterward that has now resolved. Discussed w/ neuro, hard to tell what's going on here. They advised mri (nothing acute) and eeg (no seizure-like activity).  - clinical monitoring  # Frequent falls Likely multifactorial - PT consulted, thinks he is close to his baseline, advising return to ALF  # Paroxysmal a-fib With rvr morning of 12/20, resolved. Chads2vasc 1 not anticoagulated, nor is he on a rate/rhythm control agent - discussed w/ cardiology advises trial of low-dose bb. Dig is an option but not a great one. Currently tolerating coreg  # Hyponatremia Appears to be hypovolemic as has resolved  with hydration - monitor off fluids  # Hypokalemia # hypomagnesmia - replete and monitor  # QTC prolongation Likely 2/2 psych meds exacerbated by electrolyte abnormalities, has improved on most recent EKG - treat electrolyte abnormalities as above  # COPD Quiescent - home inhalers  # Hypotension Likely multifactorial. Meds likely contributing. On midodrine per pcp - continue midodrine  # Schizophrenia Ck wnl.  Intermittent agitation, none today - continue home haldol - on monthly invega as well - trazodone at bedtime  # Chronic pain stable    DVT prophylaxis: lovenox Code Status: full Family Communication: sister updated telephonically 12/20  Level of care: Med-Surg Status is: Inpatient Remains inpatient appropriate because: need for inpatient w/u    Consultants:  Psych, ortho, ID  Procedures: None thus far  Antimicrobials:  ceftriaxone   Subjective: Alert, no complaints, feeling well  Objective: Vitals:   01/16/23 1738 01/16/23 2025 01/17/23 0409 01/17/23 0824  BP: (!) 123/91 130/88 111/79 112/77  Pulse: 81 97 73 72  Resp: 16 20 18 16   Temp: 97.9 F (36.6 C)  98.6 F (37 C) 97.8 F (36.6 C)  TempSrc:   Oral Oral  SpO2: 97% 96% 93% 92%  Weight:        Intake/Output Summary (Last 24 hours) at 01/17/2023 1308 Last data filed at 01/17/2023 0435 Gross per 24 hour  Intake 1029.55 ml  Output 1900 ml  Net -870.45 ml    Filed Weights   01/13/23 2200  Weight: 78.9 kg    Examination:  General exam: Appears calm and comfortable, disheveled,   Respiratory system: Clear to auscultation. Respiratory effort normal. Cardiovascular system: S1 & S2 heard, RRR.   Gastrointestinal system: Abdomen is nondistended, soft and nontender.   Central nervous system: Alert and oriented to self. No focal neurological deficits. Extremities: Symmetric 5 x 5 power. Skin: No rashes, lesions or ulcers Psychiatry: calm    Data Reviewed: I have personally reviewed following labs and imaging studies  CBC: Recent Labs  Lab 01/13/23 2129 01/14/23 0657  WBC 12.7* 12.9*  NEUTROABS 11.6*  --   HGB 10.2* 10.5*  HCT 30.4* 31.7*  MCV 73.4* 74.8*  PLT 284 263   Basic Metabolic Panel: Recent Labs  Lab 01/13/23 2128 01/13/23 2129 01/13/23 2333 01/14/23 0657 01/15/23 0447 01/16/23 0518 01/17/23 0514  NA  --    < > 128* 138 133* 138 136  K  --    < > 3.1* 4.6 4.1 3.9 3.8   CL  --    < > 99 110 104 105 105  CO2  --    < > 21* 21* 21* 25 24  GLUCOSE  --    < > 148* 189* 99 115* 106*  BUN  --    < > 11 9 10 12 11   CREATININE  --    < > 0.93 0.79 0.75 0.78 0.69  CALCIUM  --    < > 7.7* 7.7* 7.9* 8.0* 8.2*  MG 1.5*  --  1.4* 2.6* 2.0  --  1.9  PHOS  --   --  2.0* 4.3 2.5  --  3.6   < > = values in this interval not displayed.   GFR: Estimated Creatinine Clearance: 84.7 mL/min (by C-G formula based on SCr of 0.69 mg/dL). Liver Function Tests: Recent Labs  Lab 01/13/23 2129  AST 22  ALT 18  ALKPHOS 87  BILITOT 1.4*  PROT 6.3*  ALBUMIN 2.4*   No results for input(s): "LIPASE", "  AMYLASE" in the last 168 hours. No results for input(s): "AMMONIA" in the last 168 hours. Coagulation Profile: Recent Labs  Lab 01/13/23 2129  INR 1.3*   Cardiac Enzymes: Recent Labs  Lab 01/13/23 2128  CKTOTAL 33*   BNP (last 3 results) No results for input(s): "PROBNP" in the last 8760 hours. HbA1C: No results for input(s): "HGBA1C" in the last 72 hours. CBG: No results for input(s): "GLUCAP" in the last 168 hours. Lipid Profile: No results for input(s): "CHOL", "HDL", "LDLCALC", "TRIG", "CHOLHDL", "LDLDIRECT" in the last 72 hours. Thyroid Function Tests: No results for input(s): "TSH", "T4TOTAL", "FREET4", "T3FREE", "THYROIDAB" in the last 72 hours.  Anemia Panel: No results for input(s): "VITAMINB12", "FOLATE", "FERRITIN", "TIBC", "IRON", "RETICCTPCT" in the last 72 hours. Urine analysis:    Component Value Date/Time   COLORURINE YELLOW (A) 01/13/2023 2130   APPEARANCEUR CLEAR (A) 01/13/2023 2130   LABSPEC 1.010 01/13/2023 2130   PHURINE 6.0 01/13/2023 2130   GLUCOSEU NEGATIVE 01/13/2023 2130   HGBUR SMALL (A) 01/13/2023 2130   BILIRUBINUR NEGATIVE 01/13/2023 2130   KETONESUR NEGATIVE 01/13/2023 2130   PROTEINUR NEGATIVE 01/13/2023 2130   NITRITE NEGATIVE 01/13/2023 2130   LEUKOCYTESUR NEGATIVE 01/13/2023 2130   Sepsis  Labs: @LABRCNTIP (procalcitonin:4,lacticidven:4)  ) Recent Results (from the past 240 hours)  Resp panel by RT-PCR (RSV, Flu A&B, Covid) Anterior Nasal Swab     Status: None   Collection Time: 01/13/23  9:29 PM   Specimen: Anterior Nasal Swab  Result Value Ref Range Status   SARS Coronavirus 2 by RT PCR NEGATIVE NEGATIVE Final    Comment: (NOTE) SARS-CoV-2 target nucleic acids are NOT DETECTED.  The SARS-CoV-2 RNA is generally detectable in upper respiratory specimens during the acute phase of infection. The lowest concentration of SARS-CoV-2 viral copies this assay can detect is 138 copies/mL. A negative result does not preclude SARS-Cov-2 infection and should not be used as the sole basis for treatment or other patient management decisions. A negative result may occur with  improper specimen collection/handling, submission of specimen other than nasopharyngeal swab, presence of viral mutation(s) within the areas targeted by this assay, and inadequate number of viral copies(<138 copies/mL). A negative result must be combined with clinical observations, patient history, and epidemiological information. The expected result is Negative.  Fact Sheet for Patients:  BloggerCourse.com  Fact Sheet for Healthcare Providers:  SeriousBroker.it  This test is no t yet approved or cleared by the [place] FDA and  has been authorized for detection and/or diagnosis of SARS-CoV-2 by FDA under an Emergency Use Authorization (EUA). This EUA will remain  in effect (meaning this test can be used) for the duration of the COVID-19 declaration under Section 564(b)(1) of the Act, 21 U.S.C.section 360bbb-3(b)(1), unless the authorization is terminated  or revoked sooner.       Influenza A by PCR NEGATIVE NEGATIVE Final   Influenza B by PCR NEGATIVE NEGATIVE Final    Comment: (NOTE) The Xpert Xpress SARS-CoV-2/FLU/RSV plus assay is intended as  an aid in the diagnosis of influenza from Nasopharyngeal swab specimens and should not be used as a sole basis for treatment. Nasal washings and aspirates are unacceptable for Xpert Xpress SARS-CoV-2/FLU/RSV testing.  Fact Sheet for Patients: BloggerCourse.com  Fact Sheet for Healthcare Providers: SeriousBroker.it  This test is not yet approved or cleared by the [place] FDA and has been authorized for detection and/or diagnosis of SARS-CoV-2 by FDA under an Emergency Use Authorization (EUA). This EUA will remain in effect (  meaning this test can be used) for the duration of the COVID-19 declaration under Section 564(b)(1) of the Act, 21 U.S.C. section 360bbb-3(b)(1), unless the authorization is terminated or revoked.     Resp Syncytial Virus by PCR NEGATIVE NEGATIVE Final    Comment: (NOTE) Fact Sheet for Patients: BloggerCourse.com  Fact Sheet for Healthcare Providers: SeriousBroker.it  This test is not yet approved or cleared by the [place] FDA and has been authorized for detection and/or diagnosis of SARS-CoV-2 by FDA under an Emergency Use Authorization (EUA). This EUA will remain in effect (meaning this test can be used) for the duration of the COVID-19 declaration under Section 564(b)(1) of the Act, 21 U.S.C. section 360bbb-3(b)(1), unless the authorization is terminated or revoked.  Performed at [facility], [address] [place], [place] [address]   Blood Culture (routine x 2)     Status: Abnormal   Collection Time: 01/13/23  9:29 PM   Specimen: BLOOD  Result Value Ref Range Status   Specimen Description   Final    BLOOD BLOOD LEFT ARM Performed at [facility], [address] [place], [place] [address]    Special Requests   Final    BOTTLES DRAWN AEROBIC AND ANAEROBIC Blood Culture adequate volume Performed at [facility], [address] [place], [place] [address]    Culture  Setup Time   Final    Organism ID to follow ANAEROBIC BOTTLE ONLY GRAM NEGATIVE RODS CRITICAL RESULT CALLED TO, READ BACK BY AND VERIFIED WITH: TREY GREENWOOD 01/14/23 1142 KLW Performed at [facility], [address] [place], [place] [address]    Culture ESCHERICHIA COLI (A)  Final   Report Status 01/16/2023 FINAL  Final   Organism ID, Bacteria ESCHERICHIA COLI  Final   Organism ID, Bacteria ESCHERICHIA COLI  Final      Susceptibility   Escherichia coli - KIRBY BAUER*    CEFAZOLIN INTERMEDIATE Intermediate    Escherichia coli - MIC*    AMPICILLIN >=32 RESISTANT Resistant     CEFEPIME <=0.12 SENSITIVE Sensitive     CEFTAZIDIME <=1 SENSITIVE Sensitive     CEFTRIAXONE <=0.25 SENSITIVE Sensitive     CIPROFLOXACIN <=0.25 SENSITIVE Sensitive     GENTAMICIN <=1 SENSITIVE Sensitive     IMIPENEM <=0.25 SENSITIVE Sensitive     TRIMETH/SULFA <=20 SENSITIVE Sensitive     AMPICILLIN/SULBACTAM 16 INTERMEDIATE Intermediate     PIP/TAZO <=4 SENSITIVE Sensitive ug/mL    * ESCHERICHIA COLI    ESCHERICHIA COLI  Urine Culture     Status: None   Collection Time: 01/13/23  9:29 PM   Specimen: Urine, Clean Catch  Result Value Ref Range Status   Specimen Description   Final    URINE, CLEAN CATCH Performed at [facility], [address], [place], [place] [address]    Special Requests   Final    NONE Performed at [facility], [address] [place] [address], [place], [place] [address]    Culture   Final    NO GROWTH Performed at [facility], [address] [place], [place] [address]    Report Status 01/15/2023 FINAL  Final  Blood Culture ID Panel (Reflexed)     Status: Abnormal   Collection Time: 01/13/23  9:29 PM  Result Value Ref Range Status   Enterococcus faecalis NOT DETECTED NOT DETECTED Final   Enterococcus Faecium NOT DETECTED NOT DETECTED Final   Listeria monocytogenes NOT DETECTED NOT DETECTED  Final  Staphylococcus species NOT DETECTED NOT DETECTED Final   Staphylococcus aureus (BCID) NOT DETECTED NOT DETECTED Final   Staphylococcus epidermidis NOT DETECTED NOT DETECTED Final   Staphylococcus lugdunensis NOT DETECTED NOT DETECTED Final   Streptococcus species NOT DETECTED NOT DETECTED Final   Streptococcus agalactiae NOT DETECTED NOT DETECTED Final   Streptococcus pneumoniae NOT DETECTED NOT DETECTED Final   Streptococcus pyogenes NOT DETECTED NOT DETECTED Final   A.calcoaceticus-baumannii NOT DETECTED NOT DETECTED Final   Bacteroides fragilis NOT DETECTED NOT DETECTED Final   Enterobacterales DETECTED (A) NOT DETECTED Final    Comment: Enterobacterales represent a large order of gram negative bacteria, not a single organism. CRITICAL RESULT CALLED TO, READ BACK BY AND VERIFIED WITH: TREY GREENWOOD 01/14/23 1142 KLW    Enterobacter cloacae complex NOT DETECTED NOT DETECTED Final   Escherichia coli DETECTED (A) NOT DETECTED Final    Comment: CRITICAL RESULT CALLED TO, READ BACK BY AND VERIFIED WITH: TREY GREENWOOD 01/14/23 1142 KLW    Klebsiella aerogenes NOT DETECTED NOT DETECTED Final   Klebsiella oxytoca NOT DETECTED NOT DETECTED Final   Klebsiella pneumoniae NOT DETECTED NOT DETECTED Final   Proteus species NOT DETECTED NOT DETECTED Final   Salmonella species NOT DETECTED NOT DETECTED Final   Serratia marcescens NOT DETECTED NOT DETECTED Final   Haemophilus influenzae NOT DETECTED NOT DETECTED Final   Neisseria meningitidis NOT DETECTED NOT DETECTED Final   Pseudomonas aeruginosa NOT DETECTED NOT DETECTED Final   Stenotrophomonas maltophilia NOT DETECTED NOT DETECTED Final   Candida albicans NOT DETECTED NOT DETECTED Final   Candida auris NOT DETECTED NOT DETECTED Final   Candida glabrata NOT DETECTED NOT DETECTED Final   Candida krusei NOT DETECTED NOT DETECTED Final   Candida parapsilosis NOT DETECTED NOT DETECTED Final   Candida tropicalis NOT DETECTED NOT  DETECTED Final   Cryptococcus neoformans/gattii NOT DETECTED NOT DETECTED Final   CTX-M ESBL NOT DETECTED NOT DETECTED Final   Carbapenem resistance IMP NOT DETECTED NOT DETECTED Final   Carbapenem resistance KPC NOT DETECTED NOT DETECTED Final   Carbapenem resistance NDM NOT DETECTED NOT DETECTED Final   Carbapenem resist OXA 48 LIKE NOT DETECTED NOT DETECTED Final   Carbapenem resistance VIM NOT DETECTED NOT DETECTED Final    Comment: Performed at [facility], [address] [place], [place] [address]  Blood Culture (routine x 2)     Status: None (Preliminary result)   Collection Time: 01/13/23 11:06 PM   Specimen: BLOOD  Result Value Ref Range Status   Specimen Description BLOOD RIGHT ARM  Final   Special Requests   Final    BOTTLES DRAWN AEROBIC AND ANAEROBIC Blood Culture adequate volume   Culture   Final    NO GROWTH 3 DAYS Performed at [facility], [address] [place], [place] [address]    Report Status PENDING  Incomplete  Respiratory (~20 pathogens) panel by PCR     Status: None   Collection Time: 01/14/23 12:15 PM   Specimen: Nasopharyngeal Swab; Respiratory  Result Value Ref Range Status   Adenovirus NOT DETECTED NOT DETECTED Final   Coronavirus 229E NOT DETECTED NOT DETECTED Final    Comment: (NOTE) The Coronavirus on the Respiratory Panel, DOES NOT test for the novel  Coronavirus (2019 nCoV)    Coronavirus HKU1 NOT DETECTED NOT DETECTED Final   Coronavirus NL63 NOT DETECTED NOT DETECTED Final   Coronavirus OC43 NOT DETECTED NOT DETECTED Final   Metapneumovirus NOT DETECTED NOT DETECTED Final   Rhinovirus /  Enterovirus NOT DETECTED NOT DETECTED Final   Influenza A NOT DETECTED NOT DETECTED Final   Influenza B NOT DETECTED NOT DETECTED Final   Parainfluenza Virus 1 NOT DETECTED NOT DETECTED Final   Parainfluenza Virus 2 NOT DETECTED NOT DETECTED Final   Parainfluenza Virus 3 NOT DETECTED NOT DETECTED Final   Parainfluenza Virus 4 NOT  DETECTED NOT DETECTED Final   Respiratory Syncytial Virus NOT DETECTED NOT DETECTED Final   Bordetella pertussis NOT DETECTED NOT DETECTED Final   Bordetella Parapertussis NOT DETECTED NOT DETECTED Final   Chlamydophila pneumoniae NOT DETECTED NOT DETECTED Final   Mycoplasma pneumoniae NOT DETECTED NOT DETECTED Final    Comment: Performed at [facility], [address], [place], [place] [address]  Culture, blood (Routine X 2) w Reflex to ID Panel     Status: None (Preliminary result)   Collection Time: 01/15/23  9:14 AM   Specimen: Left Antecubital; Blood  Result Value Ref Range Status   Specimen Description LEFT ANTECUBITAL  Final   Special Requests   Final    BLOOD BOTTLES DRAWN AEROBIC AND ANAEROBIC Blood Culture adequate volume   Culture   Final    NO GROWTH 2 DAYS Performed at [facility], [address], [place], [place] [address]    Report Status PENDING  Incomplete  Culture, blood (Routine X 2) w Reflex to ID Panel     Status: None (Preliminary result)   Collection Time: 01/15/23  9:15 AM   Specimen: BLOOD RIGHT HAND  Result Value Ref Range Status   Specimen Description BLOOD RIGHT HAND  Final   Special Requests   Final    BLOOD BOTTLES DRAWN AEROBIC AND ANAEROBIC Blood Culture adequate volume   Culture   Final    NO GROWTH 2 DAYS Performed at [facility], [address], [place], [place] [address]    Report Status PENDING  Incomplete         Radiology Studies: MR Lumbar Spine W Wo Contrast Result Date: 01/17/2023 CLINICAL DATA:  Osteomyelitis, lumbar.  Bacteremia. EXAM: MRI LUMBAR SPINE WITHOUT AND WITH CONTRAST TECHNIQUE: Multiplanar and multiecho pulse sequences of the lumbar spine were obtained without and with intravenous contrast. CONTRAST:  8mL GADAVIST GADOBUTROL 1 MMOL/ML IV SOLN COMPARISON:  Abdominal CT from yesterday FINDINGS: Segmentation:  5 lumbar type vertebrae Alignment:  Mild levoscoliosis. Vertebrae: T12 compression  fracture with marrow edema. Height loss is moderate at approximate 50%. Remote and healed L3 superior endplate fracture with mild height loss. No marrow edema to suggest discitis or osteomyelitis. Hemangioma in the L3 body. Conus medullaris and cauda equina: Conus extends to the T12-L1 level. Conus and cauda equina appear normal. Paraspinal and other soft tissues: No perispinal mass or inflammation. Partially covered collection posterior to the left hip. No change from CT yesterday. Disc levels: T12- L1: Unremarkable. L1-L2: Disc height loss and desiccation with bulge. No neural compression L2-L3: Degenerative facet spurring. Circumferential disc bulging. No neural compression L3-L4: Disc bulging with endplate and facet spurring. Mild right subarticular recess narrowing L4-L5: Disc narrowing with bulging and endplate/facet spurring eccentric to the left. L5-S1:Degenerative facet spurring. Left foraminal annular fissure. No neural compression. IMPRESSION: 1. Acute or subacute T12 compression fracture with moderate height loss. Remote, healed L3 compression fracture. 2. No evidence of discitis or osteomyelitis. 3. Generalized lumbar spine degeneration with scoliosis. Affected levels are described above. Electronically Signed   By: Tiburcio Pea M.D.   On: 01/17/2023 10:19   CT ABDOMEN  PELVIS W CONTRAST Result Date: 01/16/2023 CLINICAL DATA:  Bacteremia. EXAM: CT ABDOMEN AND PELVIS WITH CONTRAST TECHNIQUE: Multidetector CT imaging of the abdomen and pelvis was performed using the standard protocol following bolus administration of intravenous contrast. RADIATION DOSE REDUCTION: This exam was performed according to the departmental dose-optimization program which includes automated exposure control, adjustment of the mA and/or kV according to patient size and/or use of iterative reconstruction technique. CONTRAST:  OMNIPAQUE IOHEXOL 300 MG/ML  SOLN COMPARISON:  None Available. FINDINGS: Lower chest: Minimal  bilateral pleural effusions are noted with minimal adjacent subsegmental atelectasis. Hepatobiliary: No focal liver abnormality is seen. No gallstones, gallbladder wall thickening, or biliary dilatation. Pancreas: Unremarkable. No pancreatic ductal dilatation or surrounding inflammatory changes. Spleen: Normal in size without focal abnormality. Adrenals/Urinary Tract: Adrenal glands appear normal. Right renal cyst is noted for which no further follow-up is required. No hydronephrosis or renal obstruction is noted. Mild urinary bladder distention is noted. Stomach/Bowel: Moderate gastric distention is noted. There is no evidence of bowel obstruction or inflammation. The appendix is unremarkable. Vascular/Lymphatic: No significant vascular findings are present. No enlarged abdominal or pelvic lymph nodes. Reproductive: Prostate is unremarkable. Other: No ascites or hernia is noted. Musculoskeletal: Severe compression deformity of T12 vertebral body is noted most consistent with old fracture. 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 Moderately depressed superior endplate fracture of L3 is noted which may be acute. Gas is noted in the L3-4 disc space and possible discitis and associated osteomyelitis cannot be excluded. Status post left total hip arthroplasty. Very large left hip joint effusion is noted and septic arthritis cannot be excluded. Status post surgical internal fixation of old proximal right femoral fracture. IMPRESSION: Moderately depressed superior endplate fracture of L3 is noted which may be acute. Gas is also noted in the L3-4 disc space and therefore possible discitis and associated osteomyelitis cannot be excluded at this level. Further evaluation with MRI of the lumbar spine with and without gadolinium is recommended. Also noted is very large left hip joint effusion surrounding left total hip arthroplasty. Septic arthritis cannot be excluded. Moderate gastric distention. Minimal bilateral  pleural effusions are noted with adjacent subsegmental atelectasis. These results will be called to the ordering clinician or representative by the Radiologist Assistant, and communication documented in the PACS or zVision Dashboard. Electronically Signed   By: Lupita Raider M.D.   On: 01/16/2023 15:52        Scheduled Meds:  benztropine  1 mg Oral Daily   carvedilol  3.125 mg Oral BID WC   enoxaparin (LOVENOX) injection  40 mg Subcutaneous Q24H   feeding supplement  237 mL Oral BID BM   haloperidol  10 mg Oral BID   midodrine  10 mg Oral TID WC   mometasone-formoterol  2 puff Inhalation BID   montelukast  10 mg Oral Daily   multivitamin with minerals  1 tablet Oral Daily   nicotine  21 mg Transdermal Daily   pantoprazole  40 mg Oral Daily   tamsulosin  0.4 mg Oral QPC supper   traZODone  300 mg Oral QHS   Continuous Infusions:  cefTRIAXone (ROCEPHIN)  IV Stopped (01/16/23 1248)     LOS: 4 days     Silvano Bilis, MD [facility]   If 7PM-7AM, please contact night-coverage [email or web address] Password [email or web address] 01/17/2023, 1:08 PM

## 2023-01-18 ENCOUNTER — Inpatient Hospital Stay: Payer: Medicare Other | Admitting: Radiology

## 2023-01-18 DIAGNOSIS — T8452XA Infection and inflammatory reaction due to internal left hip prosthesis, initial encounter: Secondary | ICD-10-CM

## 2023-01-18 DIAGNOSIS — G9341 Metabolic encephalopathy: Secondary | ICD-10-CM | POA: Diagnosis not present

## 2023-01-18 DIAGNOSIS — R7881 Bacteremia: Secondary | ICD-10-CM | POA: Diagnosis not present

## 2023-01-18 DIAGNOSIS — R32 Unspecified urinary incontinence: Secondary | ICD-10-CM | POA: Diagnosis not present

## 2023-01-18 DIAGNOSIS — R6510 Systemic inflammatory response syndrome (SIRS) of non-infectious origin without acute organ dysfunction: Secondary | ICD-10-CM | POA: Diagnosis not present

## 2023-01-18 DIAGNOSIS — B9620 Unspecified Escherichia coli [E. coli] as the cause of diseases classified elsewhere: Secondary | ICD-10-CM | POA: Diagnosis not present

## 2023-01-18 DIAGNOSIS — L02416 Cutaneous abscess of left lower limb: Secondary | ICD-10-CM

## 2023-01-18 HISTORY — PX: IR IMAGE GUIDED FLUID DRAIN BY CATHETER: IMG5463

## 2023-01-18 HISTORY — PX: IR US GUIDE BX ASP/DRAIN: IMG2392

## 2023-01-18 LAB — BODY FLUID CELL COUNT WITH DIFFERENTIAL
Eos, Fluid: 0 %
Lymphs, Fluid: 1 %
Monocyte-Macrophage-Serous Fluid: 2 %
Neutrophil Count, Fluid: 97 %
Total Nucleated Cell Count, Fluid: 9947 uL

## 2023-01-18 LAB — PROTIME-INR
INR: 1.1 (ref 0.8–1.2)
Prothrombin Time: 14.7 s (ref 11.4–15.2)

## 2023-01-18 MED ORDER — ENOXAPARIN SODIUM 40 MG/0.4ML IJ SOSY: 40.0000 mg | PREFILLED_SYRINGE | INTRAMUSCULAR | Status: DC

## 2023-01-18 MED ORDER — LIDOCAINE HCL 1 % IJ SOLN
INTRAMUSCULAR | Status: AC
  Filled 2023-01-18: qty 20

## 2023-01-18 MED ORDER — SODIUM CHLORIDE 0.9% FLUSH
5.0000 mL | Freq: Three times a day (TID) | INTRAVENOUS | Status: DC
  Administered 2023-01-21 – 2023-01-22 (×3): 5 mL

## 2023-01-18 MED ORDER — LIDOCAINE HCL 1 % IJ SOLN
7.0000 mL | Freq: Once | INTRAMUSCULAR | Status: AC
  Administered 2023-01-18: 7 mL via INTRADERMAL

## 2023-01-18 MED ORDER — FENTANYL CITRATE (PF) 100 MCG/2ML IJ SOLN
INTRAMUSCULAR | Status: AC
  Filled 2023-01-18: qty 2

## 2023-01-18 MED ORDER — IOHEXOL 300 MG/ML  SOLN
5.0000 mL | Freq: Once | INTRAMUSCULAR | Status: AC | PRN
  Administered 2023-01-18: 5 mL

## 2023-01-18 MED ORDER — MIDAZOLAM HCL 2 MG/2ML IJ SOLN
INTRAMUSCULAR | Status: AC
Start: 2023-01-18 — End: ?
  Filled 2023-01-18: qty 4

## 2023-01-18 NOTE — Progress Notes (Signed)
Subjective: Patient seen and evaluated at bedside with sister Adrian Gillespie present.  Patient had IR drainage of his left thigh abscess and hip today.  Discussed case with radiologist who encountered frank pus which communicated with the joint.  Patient feels well at this time denies any systemic symptoms such as fevers or chills.  Denies any significant pain in his left hip at this time.   Objective: Vital signs in last 24 hours: Temp:  [98 F (36.7 C)-98.5 F (36.9 C)] 98.5 F (36.9 C) (12/22 1221) Pulse Rate:  [0-84] 84 (12/22 1221) Resp:  [9-24] 16 (12/22 1140) BP: (106-137)/(76-105) 114/84 (12/22 1221) SpO2:  [83 %-98 %] 96 % (12/22 1221)  Intake/Output from previous day: 12/21 0701 - 12/22 0700 In: -  Out: 1100 [Urine:1100] Intake/Output this shift: Total I/O In: -  Out: 160 [Drains:160]  No results for input(s): "HGB" in the last 72 hours. No results for input(s): "WBC", "RBC", "HCT", "PLT" in the last 72 hours. Recent Labs    01/16/23 0518 01/17/23 0514  NA 138 136  K 3.9 3.8  CL 105 105  CO2 25 24  BUN 12 11  CREATININE 0.78 0.69  GLUCOSE 115* 106*  CALCIUM 8.0* 8.2*   Recent Labs    01/18/23 0506  INR 1.1   Orthopedic Exam:  Left lower extremity There is a well-healed surgical incision over the posterior lateral hip with no erythema minimal increased warmth to the area no ecchymosis There is some palpable fluctuance within the soft tissues diffusely across the entire area of the posterior hip and gluteal muscles Drain in place with serosanguineous drainage noted in the bulb Neurovascular intact down the lower extremity with no tenderness to palpation able dorsiflex and plantarflex his foot and toes within its intact dorsalis pedis pulse  X-rays: CT scan reviewed which shows a left hip effusion extending into the soft tissues with hydrodissection within the gluteal muscles. Agree with radiologist interpretation. No evidence of periprosthetic fracture or  osteomyelitis around the hip stem but there is limitation given the metal artifact.   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  Based on the cell count and imaging findings along with the communication with the hip joint noted today by interventional radiology I do believe that the patient is experiencing a septic left total hip with associated soft tissue abscesses within the gluteal muscles and left thigh.  We discussed the treatment options including surgical and nonsurgical treatments and under shared decision making model the patient and his sister agreed with the plan to move forward with a left hip intervention.  We will plan for a left hip and thigh irrigation and debridement and assuming communication to the joint we will proceed with joint washout, head and liner exchange with placement of antibiotic beads and drains.  Surgical risks including the risk of damage to surrounding structures, nerves, vessels, bone, ligaments and tendons, anesthesia risks, need for transfusion, repeat infection, need for additional surgical procedures in the future were reviewed with the patient and his sister.  We did discuss that if we encounter osteomyelitis or loose components that we would proceed with a temporary hip spacer with staged reimplantation in the future.  They agree with the above plan to move forward with surgery and except the risk associated.  Consent was filled out signed by the power of attorney, sister, and placed on the chart witnessed  by the floor nurse.  Plan for OR tomorrow 01/19/2023 for left hip washout N.p.o. after midnight for operating room tomorrow Hold chemical anticoagulation for OR tomorrow Labs to be drawn in the morning Consent completed on the chart Will mark patient in preop holding for surgery. All questions answered patient and his sister agree with the  above plan for surgery tomorrow on his left hip.   Adrian Gillespie 01/18/2023, 3:01 PM

## 2023-01-18 NOTE — Progress Notes (Signed)
Date of Admission:  01/13/2023    ID: Adrian Gillespie is a 72 y.o. male  Principal Problem:   SIRS (systemic inflammatory response syndrome) (HCC) Active Problems:   Hypokalemia   Schizophrenia (HCC)   Chronic obstructive pulmonary disease (HCC)   HTN (hypertension)   AF (paroxysmal atrial fibrillation) (HCC)   Fall at home, initial encounter   Hyponatremia   Prolonged QT interval   Electrolyte disturbance   Hypophosphatemia   Acute metabolic encephalopathy   Left shoulder pain   Hypomagnesemia   Hypotension   E coli bacteremia   Sepsis without acute organ dysfunction (HCC)    Subjective: Pt has no hip pain Doing okay  Medications:   benztropine  1 mg Oral Daily   carvedilol  3.125 mg Oral BID WC   feeding supplement  237 mL Oral BID BM   haloperidol  10 mg Oral BID   midodrine  10 mg Oral TID WC   mometasone-formoterol  2 puff Inhalation BID   montelukast  10 mg Oral Daily   multivitamin with minerals  1 tablet Oral Daily   nicotine  21 mg Transdermal Daily   pantoprazole  40 mg Oral Daily   tamsulosin  0.4 mg Oral QPC supper   traZODone  300 mg Oral QHS    Objective: Vital signs in last 24 hours: Patient Vitals for the past 24 hrs:  BP Temp Temp src Pulse Resp SpO2  01/18/23 1538 112/84 97.6 F (36.4 C) Oral 84 18 95 %  01/18/23 1221 114/84 98.5 F (36.9 C) Oral 84 -- 96 %  01/18/23 1144 (!) 112/99 -- -- (!) 0 -- --  01/18/23 1140 106/79 -- -- 66 16 95 %  01/18/23 1139 106/79 -- -- 81 15 --  01/18/23 1134 (!) 110/90 -- -- 76 12 --  01/18/23 1129 112/89 -- -- 80 20 (!) 83 %  01/18/23 1125 127/84 -- -- 68 17 95 %  01/18/23 1124 127/84 -- -- 70 15 95 %  01/18/23 1120 127/84 -- -- 65 15 93 %  01/18/23 1119 123/87 -- -- 68 (!) 9 91 %  01/18/23 1115 123/87 -- -- 66 13 96 %  01/18/23 1114 127/82 -- -- 67 15 94 %  01/18/23 1110 127/82 -- -- 63 17 93 %  01/18/23 1109 128/87 -- -- (!) 59 16 91 %  01/18/23 1105 128/87 -- -- 64 18 93 %  01/18/23 1104 (!)  108/95 -- -- 67 19 94 %  01/18/23 1100 (!) 108/95 -- -- 61 18 93 %  01/18/23 1059 (!) 132/90 -- -- 62 (!) 24 93 %  01/18/23 1055 (!) 132/90 -- -- 60 14 93 %  01/18/23 1054 134/84 -- -- 61 (!) 21 92 %  01/18/23 1050 134/84 -- -- 63 16 93 %  01/18/23 1049 135/85 -- -- 62 17 94 %  01/18/23 1045 135/85 -- -- 61 16 93 %  01/18/23 1044 (!) 136/90 -- -- (!) 58 13 94 %  01/18/23 1039 (!) 117/105 -- -- (!) 59 19 97 %  01/18/23 1034 (!) 137/90 -- -- 62 17 97 %  01/18/23 1030 135/82 -- -- 62 17 98 %  01/18/23 1029 135/82 -- -- 71 17 98 %  01/18/23 1025 129/82 -- -- 66 14 92 %  01/18/23 1025 129/82 -- -- 68 16 93 %  01/18/23 0817 110/76 98.2 F (36.8 C) -- (!) 57 16 94 %  01/18/23 0357 114/79 98 F (36.7 C)  Oral 71 -- 95 %  01/17/23 2115 115/81 98.1 F (36.7 C) Oral 71 -- 96 %      PHYSICAL EXAM:  General: Alert, cooperative, no distress, appears stated age.  Lungs: Clear to auscultation bilaterally. No Wheezing or Rhonchi. No rales. Heart: Regular rate and rhythm, no murmur, rub or gallop. Abdomen: Soft, non-tender,not distended. Bowel sounds normal. No masses Extremities:left hip surgical scar Moves lower extremities in bed well Skin: No rashes or lesions. Or bruising Lymph: Cervical, supraclavicular normal. Neurologic: Grossly non-focal  Lab Results    Latest Ref Rng & Units 01/14/2023    6:57 AM 01/13/2023    9:29 PM 09/22/2022    5:31 AM  CBC  WBC 4.0 - 10.5 K/uL 12.9  12.7  8.5   Hemoglobin 13.0 - 17.0 g/dL 62.1  30.8  9.4   Hematocrit 39.0 - 52.0 % 31.7  30.4  29.9   Platelets 150 - 400 K/uL 263  284  331        Latest Ref Rng & Units 01/17/2023    5:14 AM 01/16/2023    5:18 AM 01/15/2023    4:47 AM  CMP  Glucose 70 - 99 mg/dL 657  846  99   BUN 8 - 23 mg/dL 11  12  10    Creatinine 0.61 - 1.24 mg/dL 9.62  9.52  8.41   Sodium 135 - 145 mmol/L 136  138  133   Potassium 3.5 - 5.1 mmol/L 3.8  3.9  4.1   Chloride 98 - 111 mmol/L 105  105  104   CO2 22 - 32 mmol/L 24   25  21    Calcium 8.9 - 10.3 mg/dL 8.2  8.0  7.9       Microbiology: Ecoli blood culture  Studies/Results: MR Lumbar Spine W Wo Contrast Result Date: 01/17/2023 CLINICAL DATA:  Osteomyelitis, lumbar.  Bacteremia. EXAM: MRI LUMBAR SPINE WITHOUT AND WITH CONTRAST TECHNIQUE: Multiplanar and multiecho pulse sequences of the lumbar spine were obtained without and with intravenous contrast. CONTRAST:  8mL GADAVIST GADOBUTROL 1 MMOL/ML IV SOLN COMPARISON:  Abdominal CT from yesterday FINDINGS: Segmentation:  5 lumbar type vertebrae Alignment:  Mild levoscoliosis. Vertebrae: T12 compression fracture with marrow edema. Height loss is moderate at approximate 50%. Remote and healed L3 superior endplate fracture with mild height loss. No marrow edema to suggest discitis or osteomyelitis. Hemangioma in the L3 body. Conus medullaris and cauda equina: Conus extends to the T12-L1 level. Conus and cauda equina appear normal. Paraspinal and other soft tissues: No perispinal mass or inflammation. Partially covered collection posterior to the left hip. No change from CT yesterday. Disc levels: T12- L1: Unremarkable. L1-L2: Disc height loss and desiccation with bulge. No neural compression L2-L3: Degenerative facet spurring. Circumferential disc bulging. No neural compression L3-L4: Disc bulging with endplate and facet spurring. Mild right subarticular recess narrowing L4-L5: Disc narrowing with bulging and endplate/facet spurring eccentric to the left. L5-S1:Degenerative facet spurring. Left foraminal annular fissure. No neural compression. IMPRESSION: 1. Acute or subacute T12 compression fracture with moderate height loss. Remote, healed L3 compression fracture. 2. No evidence of discitis or osteomyelitis. 3. Generalized lumbar spine degeneration with scoliosis. Affected levels are described above. Electronically Signed   By: Tiburcio Pea M.D.   On: 01/17/2023 10:19     Assessment/Plan: Encephalopathy-  metabolic resolved   Sepsis secondary to ecoli Has ecoli bacteremia with no clear source Has urinary incontinence Check bladder scan= 351  abdominal imaging-CT  -- left hip joint  fluid collection raises concern for septic joint ,aspiration showed >9000 cells- washout tomorrow- Prosthetic joint infection  CT scan questioned discitis in the lumbar spine MRI no evidence of discitis   continue ceftriaxone- will need up to 12 weeks of antibiotic-   Bladder distension in CT With incontinence, r/o incomplete empyting     Fall- pain left shoulder- xray N   Anemia   Hyponatremia     ?COPD   Schizophrenia  Discussed the management with patient and care team

## 2023-01-18 NOTE — Procedures (Signed)
Interventional Radiology Procedure Note  Procedure: [place] and Fluoro guided left thigh abscess drain placement  Indication: Left thigh and pelvic abscess  Findings: Please refer to procedural dictation for full description.  Complications: None  EBL: < 10 mL  Acquanetta Belling, MD [phone]

## 2023-01-18 NOTE — Consult Note (Signed)
Brief Pharmacy Note  Pharmacy consulted to review anticoagulation post-IR procedure. Pt is POD # 0 from left thigh abscess drain placement with associated standard bleeding risk. Patient on Lovenox for DVT prophylaxis due today at 2200. Will move dose to tomorrow, 12/23 at 1000.  Adrian Gillespie 01/18/23

## 2023-01-18 NOTE — H&P (View-Only) (Signed)
Subjective: Patient seen and evaluated at bedside with sister Scarlette Calico present.  Patient had IR drainage of his left thigh abscess and hip today.  Discussed case with radiologist who encountered frank pus which communicated with the joint.  Patient feels well at this time denies any systemic symptoms such as fevers or chills.  Denies any significant pain in his left hip at this time.   Objective: Vital signs in last 24 hours: Temp:  [98 F (36.7 C)-98.5 F (36.9 C)] 98.5 F (36.9 C) (12/22 1221) Pulse Rate:  [0-84] 84 (12/22 1221) Resp:  [9-24] 16 (12/22 1140) BP: (106-137)/(76-105) 114/84 (12/22 1221) SpO2:  [83 %-98 %] 96 % (12/22 1221)  Intake/Output from previous day: 12/21 0701 - 12/22 0700 In: -  Out: 1100 [Urine:1100] Intake/Output this shift: Total I/O In: -  Out: 160 [Drains:160]  No results for input(s): "HGB" in the last 72 hours. No results for input(s): "WBC", "RBC", "HCT", "PLT" in the last 72 hours. Recent Labs    01/16/23 0518 01/17/23 0514  NA 138 136  K 3.9 3.8  CL 105 105  CO2 25 24  BUN 12 11  CREATININE 0.78 0.69  GLUCOSE 115* 106*  CALCIUM 8.0* 8.2*   Recent Labs    01/18/23 0506  INR 1.1   Orthopedic Exam:  Left lower extremity There is a well-healed surgical incision over the posterior lateral hip with no erythema minimal increased warmth to the area no ecchymosis There is some palpable fluctuance within the soft tissues diffusely across the entire area of the posterior hip and gluteal muscles Drain in place with serosanguineous drainage noted in the bulb Neurovascular intact down the lower extremity with no tenderness to palpation able dorsiflex and plantarflex his foot and toes within its intact dorsalis pedis pulse  X-rays: CT scan reviewed which shows a left hip effusion extending into the soft tissues with hydrodissection within the gluteal muscles. Agree with radiologist interpretation. No evidence of periprosthetic fracture or  osteomyelitis around the hip stem but there is limitation given the metal artifact.   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  Based on the cell count and imaging findings along with the communication with the hip joint noted today by interventional radiology I do believe that the patient is experiencing a septic left total hip with associated soft tissue abscesses within the gluteal muscles and left thigh.  We discussed the treatment options including surgical and nonsurgical treatments and under shared decision making model the patient and his sister agreed with the plan to move forward with a left hip intervention.  We will plan for a left hip and thigh irrigation and debridement and assuming communication to the joint we will proceed with joint washout, head and liner exchange with placement of antibiotic beads and drains.  Surgical risks including the risk of damage to surrounding structures, nerves, vessels, bone, ligaments and tendons, anesthesia risks, need for transfusion, repeat infection, need for additional surgical procedures in the future were reviewed with the patient and his sister.  We did discuss that if we encounter osteomyelitis or loose components that we would proceed with a temporary hip spacer with staged reimplantation in the future.  They agree with the above plan to move forward with surgery and except the risk associated.  Consent was filled out signed by the power of attorney, sister, and placed on the chart witnessed  by the floor nurse.  Plan for OR tomorrow 01/19/2023 for left hip washout N.p.o. after midnight for operating room tomorrow Hold chemical anticoagulation for OR tomorrow Labs to be drawn in the morning Consent completed on the chart Will mark patient in preop holding for surgery. All questions answered patient and his sister agree with the  above plan for surgery tomorrow on his left hip.   Adrian Gillespie 01/18/2023, 3:01 PM

## 2023-01-18 NOTE — Progress Notes (Addendum)
PROGRESS NOTE    Adrian Gillespie  WNU:[id number] DOB: October 19, 1950 DOA: 01/13/2023 PCP: Andres Shad, MD     Brief Narrative:   From admission h and p  Adrian Gillespie is a 72 y.o. male with medical history significant of HTN, HLD, COPD, asthma, GERD, depression, anxiety, schizophrenia, HBV, tobacco abuse, who presents with AMS, fever and fall.   Per report, pt fell twice in SNF today. It is unclear whether he hit his head or lost consciousness. He report right shoulder pain. Pt was noted to be more confused than usual. When EMS arrived he was found to be febrile and tachycardic.  His temperature is 102.2 in ED.  Patient received 2 L LR bolus in ED.  His mental status has gradually improved.  When I saw patient in ED, he is alert, knows his own name, knows that he is in hospital, confused about time.  He moves all extremities normally.  Answered most of questions appropriately.  He reports dry cough, denies chest pain, nausea, vomiting, diarrhea or abdominal pain.  No symptoms of UTI.  He denies shortness  of breath, but was found to have oxygen desaturation to 86% on room air, which improved to 97% on 2 L oxygen.  Patient states that he was on oxygen, but does not remember how much oxygen he was using at home.  Patient does not have neck pain, no neck rigidity on my examination.   Assessment & Plan:   Principal Problem:   SIRS (systemic inflammatory response syndrome) (HCC) Active Problems:   Fall at home, initial encounter   Left shoulder pain   Chronic obstructive pulmonary disease (HCC)   AF (paroxysmal atrial fibrillation) (HCC)   Hypokalemia   Hyponatremia   Electrolyte disturbance   Hypophosphatemia   Hypomagnesemia   HTN (hypertension)   Prolonged QT interval   Acute metabolic encephalopathy   Schizophrenia (HCC)   Hypotension   E coli bacteremia   Sepsis without acute organ dysfunction (HCC)  # E coli bacteremia No abdominal pain or diarrhea to suggest  intra-abdominal pathology though patient unable to provide accurate history. Not retaining urine - PVR is 0. CT abdomen no intra-abdominal infection. Denied hip pain but CT of abdomen incidentally showed large left hip effusion and IR aspiration today revealed purulent fluid in the left hip tracking into the ghigh - continue ceftriaxone - CT abdomen/pelvis to evaluate for GI infection  # Left hip septic arthritis Large effusion, incidental on CT. IR aspiration today encountered purulent fluid in the left hip tracking into the ghigh - ortho planning on wash-out tomorrow - discussed abx with ID, will continue current ceftriaxone as he has responded well to that  # Subacute compression fracture On MRI. CT showed possible signs infection but none seen on mri  # Seizure concern Report of fall day prior to admission, shaking, and confusion afterward that has now resolved. Discussed w/ neuro, hard to tell what's going on here. They advised mri (nothing acute) and eeg (no seizure-like activity).  - clinical monitoring  # Frequent falls Likely multifactorial PT consulteded, thinks he is close to his baseline, advising return to ALF though will need to be re-evaluated after surgery  # Paroxysmal a-fib With rvr morning of 12/20, resolved. Chads2vasc 1 not anticoagulated, nor is he on a rate/rhythm control agent - discussed w/ cardiology advises trial of low-dose bb. Dig is an option but not a great one. Currently tolerating coreg.  # Hyponatremia Appears to be hypovolemic as has resolved  with hydration - monitor off fluids  # Hypokalemia # hypomagnesmia - replete and monitor  # QTC prolongation Likely 2/2 psych meds exacerbated by electrolyte abnormalities, has improved on most recent EKG - treat electrolyte abnormalities as above  # COPD Quiescent - home inhalers  # Hypotension Likely multifactorial. Meds likely contributing. On midodrine per pcp - continue midodrine  #  Schizophrenia Ck wnl. Intermittent agitation, none the last few days - continue home haldol - on monthly invega as well - trazodone at bedtime  # Chronic pain stable    DVT prophylaxis: lovenox Code Status: full Family Communication: sister updated telephonically 12/22  Level of care: Med-Surg Status is: Inpatient Remains inpatient appropriate because: need for inpatient w/u    Consultants:  Psych, ortho, ID  Procedures: None thus far  Antimicrobials:  ceftriaxone   Subjective: Alert, no complaints, feeling well, tolerated procedure  Objective: Vitals:   01/18/23 1139 01/18/23 1140 01/18/23 1144 01/18/23 1221  BP: 106/79 106/79 (!) 112/99 114/84  Pulse: 81 66 (!) 0 84  Resp: 15 16    Temp:    98.5 F (36.9 C)  TempSrc:    Oral  SpO2:  95%  96%  Weight:        Intake/Output Summary (Last 24 hours) at 01/18/2023 1343 Last data filed at 01/18/2023 1222 Gross per 24 hour  Intake --  Output 1170 ml  Net -1170 ml    Filed Weights   01/13/23 2200  Weight: 78.9 kg    Examination:  General exam: Appears calm and comfortable, disheveled,   Respiratory system: Clear to auscultation. Respiratory effort normal. Cardiovascular system: S1 & S2 heard, RRR.   Gastrointestinal system: Abdomen is nondistended, soft and nontender.   Central nervous system: Alert and oriented to self. No focal neurological deficits. Extremities: Symmetric 5 x 5 power. Drain from left hip bloody Skin: No rashes, lesions or ulcers Psychiatry: calm    Data Reviewed: I have personally reviewed following labs and imaging studies  CBC: Recent Labs  Lab 01/13/23 2129 01/14/23 0657  WBC 12.7* 12.9*  NEUTROABS 11.6*  --   HGB 10.2* 10.5*  HCT 30.4* 31.7*  MCV 73.4* 74.8*  PLT 284 263   Basic Metabolic Panel: Recent Labs  Lab 01/13/23 2128 01/13/23 2129 01/13/23 2333 01/14/23 0657 01/15/23 0447 01/16/23 0518 01/17/23 0514  NA  --    < > 128* 138 133* 138 136  K  --     < > 3.1* 4.6 4.1 3.9 3.8  CL  --    < > 99 110 104 105 105  CO2  --    < > 21* 21* 21* 25 24  GLUCOSE  --    < > 148* 189* 99 115* 106*  BUN  --    < > 11 9 10 12 11   CREATININE  --    < > 0.93 0.79 0.75 0.78 0.69  CALCIUM  --    < > 7.7* 7.7* 7.9* 8.0* 8.2*  MG 1.5*  --  1.4* 2.6* 2.0  --  1.9  PHOS  --   --  2.0* 4.3 2.5  --  3.6   < > = values in this interval not displayed.   GFR: Estimated Creatinine Clearance: 84.7 mL/min (by C-G formula based on SCr of 0.69 mg/dL). Liver Function Tests: Recent Labs  Lab 01/13/23 2129  AST 22  ALT 18  ALKPHOS 87  BILITOT 1.4*  PROT 6.3*  ALBUMIN 2.4*   No  results for input(s): "LIPASE", "AMYLASE" in the last 168 hours. No results for input(s): "AMMONIA" in the last 168 hours. Coagulation Profile: Recent Labs  Lab 01/13/23 2129 01/18/23 0506  INR 1.3* 1.1   Cardiac Enzymes: Recent Labs  Lab 01/13/23 2128  CKTOTAL 33*   BNP (last 3 results) No results for input(s): "PROBNP" in the last 8760 hours. HbA1C: No results for input(s): "HGBA1C" in the last 72 hours. CBG: No results for input(s): "GLUCAP" in the last 168 hours. Lipid Profile: No results for input(s): "CHOL", "HDL", "LDLCALC", "TRIG", "CHOLHDL", "LDLDIRECT" in the last 72 hours. Thyroid Function Tests: No results for input(s): "TSH", "T4TOTAL", "FREET4", "T3FREE", "THYROIDAB" in the last 72 hours.  Anemia Panel: No results for input(s): "VITAMINB12", "FOLATE", "FERRITIN", "TIBC", "IRON", "RETICCTPCT" in the last 72 hours. Urine analysis:    Component Value Date/Time   COLORURINE YELLOW (A) 01/13/2023 2130   APPEARANCEUR CLEAR (A) 01/13/2023 2130   LABSPEC 1.010 01/13/2023 2130   PHURINE 6.0 01/13/2023 2130   GLUCOSEU NEGATIVE 01/13/2023 2130   HGBUR SMALL (A) 01/13/2023 2130   BILIRUBINUR NEGATIVE 01/13/2023 2130   KETONESUR NEGATIVE 01/13/2023 2130   PROTEINUR NEGATIVE 01/13/2023 2130   NITRITE NEGATIVE 01/13/2023 2130   LEUKOCYTESUR NEGATIVE 01/13/2023  2130   Sepsis Labs: @LABRCNTIP (procalcitonin:4,lacticidven:4)  ) Recent Results (from the past 240 hours)  Resp panel by RT-PCR (RSV, Flu A&B, Covid) Anterior Nasal Swab     Status: None   Collection Time: 01/13/23  9:29 PM   Specimen: Anterior Nasal Swab  Result Value Ref Range Status   SARS Coronavirus 2 by RT PCR NEGATIVE NEGATIVE Final    Comment: (NOTE) SARS-CoV-2 target nucleic acids are NOT DETECTED.  The SARS-CoV-2 RNA is generally detectable in upper respiratory specimens during the acute phase of infection. The lowest concentration of SARS-CoV-2 viral copies this assay can detect is 138 copies/mL. A negative result does not preclude SARS-Cov-2 infection and should not be used as the sole basis for treatment or other patient management decisions. A negative result may occur with  improper specimen collection/handling, submission of specimen other than nasopharyngeal swab, presence of viral mutation(s) within the areas targeted by this assay, and inadequate number of viral copies(<138 copies/mL). A negative result must be combined with clinical observations, patient history, and epidemiological information. The expected result is Negative.  Fact Sheet for Patients:  BloggerCourse.com  Fact Sheet for Healthcare Providers:  SeriousBroker.it  This test is no t yet approved or cleared by the [place] FDA and  has been authorized for detection and/or diagnosis of SARS-CoV-2 by FDA under an Emergency Use Authorization (EUA). This EUA will remain  in effect (meaning this test can be used) for the duration of the COVID-19 declaration under Section 564(b)(1) of the Act, 21 U.S.C.section 360bbb-3(b)(1), unless the authorization is terminated  or revoked sooner.       Influenza A by PCR NEGATIVE NEGATIVE Final   Influenza B by PCR NEGATIVE NEGATIVE Final    Comment: (NOTE) The Xpert Xpress SARS-CoV-2/FLU/RSV plus assay  is intended as an aid in the diagnosis of influenza from Nasopharyngeal swab specimens and should not be used as a sole basis for treatment. Nasal washings and aspirates are unacceptable for Xpert Xpress SARS-CoV-2/FLU/RSV testing.  Fact Sheet for Patients: BloggerCourse.com  Fact Sheet for Healthcare Providers: SeriousBroker.it  This test is not yet approved or cleared by the [place] FDA and has been authorized for detection and/or diagnosis of SARS-CoV-2 by FDA under an Emergency Use Authorization (  EUA). This EUA will remain in effect (meaning this test can be used) for the duration of the COVID-19 declaration under Section 564(b)(1) of the Act, 21 U.S.C. section 360bbb-3(b)(1), unless the authorization is terminated or revoked.     Resp Syncytial Virus by PCR NEGATIVE NEGATIVE Final    Comment: (NOTE) Fact Sheet for Patients: BloggerCourse.com  Fact Sheet for Healthcare Providers: SeriousBroker.it  This test is not yet approved or cleared by the [place] FDA and has been authorized for detection and/or diagnosis of SARS-CoV-2 by FDA under an Emergency Use Authorization (EUA). This EUA will remain in effect (meaning this test can be used) for the duration of the COVID-19 declaration under Section 564(b)(1) of the Act, 21 U.S.C. section 360bbb-3(b)(1), unless the authorization is terminated or revoked.  Performed at [facility], [address] [place], [place] [address]   Blood Culture (routine x 2)     Status: Abnormal   Collection Time: 01/13/23  9:29 PM   Specimen: BLOOD  Result Value Ref Range Status   Specimen Description   Final    BLOOD BLOOD LEFT ARM Performed at [facility], [address], [place], [place] [address]    Special Requests   Final    BOTTLES DRAWN AEROBIC AND ANAEROBIC Blood Culture adequate volume Performed at  [facility], [address] [place], [place] [address]    Culture  Setup Time   Final    Organism ID to follow ANAEROBIC BOTTLE ONLY GRAM NEGATIVE RODS CRITICAL RESULT CALLED TO, READ BACK BY AND VERIFIED WITH: TREY GREENWOOD 01/14/23 1142 KLW Performed at [facility], [address] [place], [place] [address]    Culture ESCHERICHIA COLI (A)  Final   Report Status 01/16/2023 FINAL  Final   Organism ID, Bacteria ESCHERICHIA COLI  Final   Organism ID, Bacteria ESCHERICHIA COLI  Final      Susceptibility   Escherichia coli - KIRBY BAUER*    CEFAZOLIN INTERMEDIATE Intermediate    Escherichia coli - MIC*    AMPICILLIN >=32 RESISTANT Resistant     CEFEPIME <=0.12 SENSITIVE Sensitive     CEFTAZIDIME <=1 SENSITIVE Sensitive     CEFTRIAXONE <=0.25 SENSITIVE Sensitive     CIPROFLOXACIN <=0.25 SENSITIVE Sensitive     GENTAMICIN <=1 SENSITIVE Sensitive     IMIPENEM <=0.25 SENSITIVE Sensitive     TRIMETH/SULFA <=20 SENSITIVE Sensitive     AMPICILLIN/SULBACTAM 16 INTERMEDIATE Intermediate     PIP/TAZO <=4 SENSITIVE Sensitive ug/mL    * ESCHERICHIA COLI    ESCHERICHIA COLI  Urine Culture     Status: None   Collection Time: 01/13/23  9:29 PM   Specimen: Urine, Clean Catch  Result Value Ref Range Status   Specimen Description   Final    URINE, CLEAN CATCH Performed at [facility], [address], [place], [place] [address]    Special Requests   Final    NONE Performed at [facility], [address] [place], [place] [address]    Culture   Final    NO GROWTH Performed at [facility], [address], [place], [place] [address]    Report Status 01/15/2023 FINAL  Final  Blood Culture ID Panel (Reflexed)     Status: Abnormal   Collection Time: 01/13/23  9:29 PM  Result Value Ref Range Status   Enterococcus faecalis NOT DETECTED NOT DETECTED Final   Enterococcus Faecium NOT DETECTED NOT DETECTED Final   Listeria monocytogenes NOT  DETECTED NOT DETECTED Final   Staphylococcus species NOT DETECTED NOT DETECTED Final   Staphylococcus aureus (BCID) NOT DETECTED NOT DETECTED Final   Staphylococcus epidermidis NOT DETECTED NOT DETECTED Final   Staphylococcus lugdunensis NOT DETECTED NOT DETECTED Final   Streptococcus species NOT DETECTED NOT DETECTED Final   Streptococcus agalactiae NOT DETECTED NOT DETECTED Final   Streptococcus pneumoniae NOT DETECTED NOT DETECTED Final   Streptococcus pyogenes NOT DETECTED NOT DETECTED Final   A.calcoaceticus-baumannii NOT DETECTED NOT DETECTED Final   Bacteroides fragilis NOT DETECTED NOT DETECTED Final   Enterobacterales DETECTED (A) NOT DETECTED Final    Comment: Enterobacterales represent a large order of gram negative bacteria, not a single organism. CRITICAL RESULT CALLED TO, READ BACK BY AND VERIFIED WITH: TREY GREENWOOD 01/14/23 1142 KLW    Enterobacter cloacae complex NOT DETECTED NOT DETECTED Final   Escherichia coli DETECTED (A) NOT DETECTED Final    Comment: CRITICAL RESULT CALLED TO, READ BACK BY AND VERIFIED WITH: TREY GREENWOOD 01/14/23 1142 KLW    Klebsiella aerogenes NOT DETECTED NOT DETECTED Final   Klebsiella oxytoca NOT DETECTED NOT DETECTED Final   Klebsiella pneumoniae NOT DETECTED NOT DETECTED Final   Proteus species NOT DETECTED NOT DETECTED Final   Salmonella species NOT DETECTED NOT DETECTED Final   Serratia marcescens NOT DETECTED NOT DETECTED Final   Haemophilus influenzae NOT DETECTED NOT DETECTED Final   Neisseria meningitidis NOT DETECTED NOT DETECTED Final   Pseudomonas aeruginosa NOT DETECTED NOT DETECTED Final   Stenotrophomonas maltophilia NOT DETECTED NOT DETECTED Final   Candida albicans NOT DETECTED NOT DETECTED Final   Candida auris NOT DETECTED NOT DETECTED Final   Candida glabrata NOT DETECTED NOT DETECTED Final   Candida krusei NOT DETECTED NOT DETECTED Final   Candida parapsilosis NOT DETECTED NOT DETECTED Final   Candida tropicalis  NOT DETECTED NOT DETECTED Final   Cryptococcus neoformans/gattii NOT DETECTED NOT DETECTED Final   CTX-M ESBL NOT DETECTED NOT DETECTED Final   Carbapenem resistance IMP NOT DETECTED NOT DETECTED Final   Carbapenem resistance KPC NOT DETECTED NOT DETECTED Final   Carbapenem resistance NDM NOT DETECTED NOT DETECTED Final   Carbapenem resist OXA 48 LIKE NOT DETECTED NOT DETECTED Final   Carbapenem resistance VIM NOT DETECTED NOT DETECTED Final    Comment: Performed at [facility] [address] [place], [place] [address]  Blood Culture (routine x 2)     Status: None (Preliminary result)   Collection Time: 01/13/23 11:06 PM   Specimen: BLOOD  Result Value Ref Range Status   Specimen Description BLOOD RIGHT ARM  Final   Special Requests   Final    BOTTLES DRAWN AEROBIC AND ANAEROBIC Blood Culture adequate volume   Culture   Final    NO GROWTH 4 DAYS Performed at [facility], [address] [place], [place] [address]    Report Status PENDING  Incomplete  Respiratory (~20 pathogens) panel by PCR     Status: None   Collection Time: 01/14/23 12:15 PM   Specimen: Nasopharyngeal Swab; Respiratory  Result Value Ref Range Status   Adenovirus NOT DETECTED NOT DETECTED Final   Coronavirus 229E NOT DETECTED NOT DETECTED Final    Comment: (NOTE) The Coronavirus on the Respiratory Panel, DOES NOT test for the novel  Coronavirus (2019 nCoV)    Coronavirus HKU1 NOT DETECTED NOT DETECTED Final   Coronavirus NL63 NOT DETECTED NOT DETECTED Final   Coronavirus OC43 NOT DETECTED NOT DETECTED Final   Metapneumovirus NOT DETECTED  NOT DETECTED Final   Rhinovirus / Enterovirus NOT DETECTED NOT DETECTED Final   Influenza A NOT DETECTED NOT DETECTED Final   Influenza B NOT DETECTED NOT DETECTED Final   Parainfluenza Virus 1 NOT DETECTED NOT DETECTED Final   Parainfluenza Virus 2 NOT DETECTED NOT DETECTED Final   Parainfluenza Virus 3 NOT DETECTED NOT DETECTED Final   Parainfluenza  Virus 4 NOT DETECTED NOT DETECTED Final   Respiratory Syncytial Virus NOT DETECTED NOT DETECTED Final   Bordetella pertussis NOT DETECTED NOT DETECTED Final   Bordetella Parapertussis NOT DETECTED NOT DETECTED Final   Chlamydophila pneumoniae NOT DETECTED NOT DETECTED Final   Mycoplasma pneumoniae NOT DETECTED NOT DETECTED Final    Comment: Performed at [facility], [address] [place], [place] [address]  Culture, blood (Routine X 2) w Reflex to ID Panel     Status: None (Preliminary result)   Collection Time: 01/15/23  9:14 AM   Specimen: Left Antecubital; Blood  Result Value Ref Range Status   Specimen Description LEFT ANTECUBITAL  Final   Special Requests   Final    BLOOD BOTTLES DRAWN AEROBIC AND ANAEROBIC Blood Culture adequate volume   Culture   Final    NO GROWTH 3 DAYS Performed at [facility], [address], [place], [place] [address]    Report Status PENDING  Incomplete  Culture, blood (Routine X 2) w Reflex to ID Panel     Status: None (Preliminary result)   Collection Time: 01/15/23  9:15 AM   Specimen: BLOOD RIGHT HAND  Result Value Ref Range Status   Specimen Description BLOOD RIGHT HAND  Final   Special Requests   Final    BLOOD BOTTLES DRAWN AEROBIC AND ANAEROBIC Blood Culture adequate volume   Culture   Final    NO GROWTH 3 DAYS Performed at [facility], [address] [place], [place] [address]    Report Status PENDING  Incomplete         Radiology Studies: MR Lumbar Spine W Wo Contrast Result Date: 01/17/2023 CLINICAL DATA:  Osteomyelitis, lumbar.  Bacteremia. EXAM: MRI LUMBAR SPINE WITHOUT AND WITH CONTRAST TECHNIQUE: Multiplanar and multiecho pulse sequences of the lumbar spine were obtained without and with intravenous contrast. CONTRAST:  8mL GADAVIST GADOBUTROL 1 MMOL/ML IV SOLN COMPARISON:  Abdominal CT from yesterday FINDINGS: Segmentation:  5 lumbar type vertebrae Alignment:  Mild levoscoliosis. Vertebrae: T12  compression fracture with marrow edema. Height loss is moderate at approximate 50%. Remote and healed L3 superior endplate fracture with mild height loss. No marrow edema to suggest discitis or osteomyelitis. Hemangioma in the L3 body. Conus medullaris and cauda equina: Conus extends to the T12-L1 level. Conus and cauda equina appear normal. Paraspinal and other soft tissues: No perispinal mass or inflammation. Partially covered collection posterior to the left hip. No change from CT yesterday. Disc levels: T12- L1: Unremarkable. L1-L2: Disc height loss and desiccation with bulge. No neural compression L2-L3: Degenerative facet spurring. Circumferential disc bulging. No neural compression L3-L4: Disc bulging with endplate and facet spurring. Mild right subarticular recess narrowing L4-L5: Disc narrowing with bulging and endplate/facet spurring eccentric to the left. L5-S1:Degenerative facet spurring. Left foraminal annular fissure. No neural compression. IMPRESSION: 1. Acute or subacute T12 compression fracture with moderate height loss. Remote, healed L3 compression fracture. 2. No evidence of discitis or osteomyelitis. 3. Generalized lumbar spine degeneration with scoliosis. Affected levels are described above. Electronically Signed   By: Audry Riles.D.  On: 01/17/2023 10:19   CT ABDOMEN PELVIS W CONTRAST Result Date: 01/16/2023 CLINICAL DATA:  Bacteremia. EXAM: CT ABDOMEN AND PELVIS WITH CONTRAST TECHNIQUE: Multidetector CT imaging of the abdomen and pelvis was performed using the standard protocol following bolus administration of intravenous contrast. RADIATION DOSE REDUCTION: This exam was performed according to the departmental dose-optimization program which includes automated exposure control, adjustment of the mA and/or kV according to patient size and/or use of iterative reconstruction technique. CONTRAST:  OMNIPAQUE IOHEXOL 300 MG/ML  SOLN COMPARISON:  None Available. FINDINGS: Lower  chest: Minimal bilateral pleural effusions are noted with minimal adjacent subsegmental atelectasis. Hepatobiliary: No focal liver abnormality is seen. No gallstones, gallbladder wall thickening, or biliary dilatation. Pancreas: Unremarkable. No pancreatic ductal dilatation or surrounding inflammatory changes. Spleen: Normal in size without focal abnormality. Adrenals/Urinary Tract: Adrenal glands appear normal. Right renal cyst is noted for which no further follow-up is required. No hydronephrosis or renal obstruction is noted. Mild urinary bladder distention is noted. Stomach/Bowel: Moderate gastric distention is noted. There is no evidence of bowel obstruction or inflammation. The appendix is unremarkable. Vascular/Lymphatic: No significant vascular findings are present. No enlarged abdominal or pelvic lymph nodes. Reproductive: Prostate is unremarkable. Other: No ascites or hernia is noted. Musculoskeletal: Severe compression deformity of T12 vertebral body is noted most consistent with old fracture. 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 Moderately depressed superior endplate fracture of L3 is noted which may be acute. Gas is noted in the L3-4 disc space and possible discitis and associated osteomyelitis cannot be excluded. Status post left total hip arthroplasty. Very large left hip joint effusion is noted and septic arthritis cannot be excluded. Status post surgical internal fixation of old proximal right femoral fracture. IMPRESSION: Moderately depressed superior endplate fracture of L3 is noted which may be acute. Gas is also noted in the L3-4 disc space and therefore possible discitis and associated osteomyelitis cannot be excluded at this level. Further evaluation with MRI of the lumbar spine with and without gadolinium is recommended. Also noted is very large left hip joint effusion surrounding left total hip arthroplasty. Septic arthritis cannot be excluded. Moderate gastric distention.  Minimal bilateral pleural effusions are noted with adjacent subsegmental atelectasis. These results will be called to the ordering clinician or representative by the Radiologist Assistant, and communication documented in the PACS or zVision Dashboard. Electronically Signed   By: Lupita Raider M.D.   On: 01/16/2023 15:52        Scheduled Meds:  benztropine  1 mg Oral Daily   carvedilol  3.125 mg Oral BID WC   enoxaparin (LOVENOX) injection  40 mg Subcutaneous Q24H   feeding supplement  237 mL Oral BID BM   haloperidol  10 mg Oral BID   midodrine  10 mg Oral TID WC   mometasone-formoterol  2 puff Inhalation BID   montelukast  10 mg Oral Daily   multivitamin with minerals  1 tablet Oral Daily   nicotine  21 mg Transdermal Daily   pantoprazole  40 mg Oral Daily   tamsulosin  0.4 mg Oral QPC supper   traZODone  300 mg Oral QHS   Continuous Infusions:  cefTRIAXone (ROCEPHIN)  IV 2 g (01/18/23 1226)     LOS: 5 days     Silvano Bilis, MD [facility]   If 7PM-7AM, please contact night-coverage [email or web address] Password [facility] [facility] 01/18/2023, 1:43 PM

## 2023-01-19 ENCOUNTER — Encounter
Admission: EM | Disposition: A | Discharge status: Skilled Nursing Facility | Payer: Self-pay | Source: Skilled Nursing Facility | Attending: Osteopathic Medicine

## 2023-01-19 ENCOUNTER — Inpatient Hospital Stay: Payer: Medicare Other | Admitting: Anesthesiology

## 2023-01-19 ENCOUNTER — Inpatient Hospital Stay: Payer: Medicare Other

## 2023-01-19 ENCOUNTER — Other Ambulatory Visit: Payer: Self-pay

## 2023-01-19 DIAGNOSIS — R7881 Bacteremia: Secondary | ICD-10-CM | POA: Diagnosis not present

## 2023-01-19 DIAGNOSIS — R32 Unspecified urinary incontinence: Secondary | ICD-10-CM | POA: Diagnosis not present

## 2023-01-19 DIAGNOSIS — R6510 Systemic inflammatory response syndrome (SIRS) of non-infectious origin without acute organ dysfunction: Secondary | ICD-10-CM | POA: Diagnosis not present

## 2023-01-19 DIAGNOSIS — G9341 Metabolic encephalopathy: Secondary | ICD-10-CM | POA: Diagnosis not present

## 2023-01-19 DIAGNOSIS — B9620 Unspecified Escherichia coli [E. coli] as the cause of diseases classified elsewhere: Secondary | ICD-10-CM | POA: Diagnosis not present

## 2023-01-19 HISTORY — PX: TOTAL HIP REVISION: SHX763

## 2023-01-19 HISTORY — PX: INCISION AND DRAINAGE HIP: SHX1801

## 2023-01-19 LAB — CULTURE, BLOOD (ROUTINE X 2)
Culture: NO GROWTH
Special Requests: ADEQUATE

## 2023-01-19 LAB — TYPE AND SCREEN
ABO/RH(D): B POS
Antibody Screen: NEGATIVE

## 2023-01-19 LAB — CBC
HCT: 30.9 % — ABNORMAL LOW (ref 39.0–52.0)
Hemoglobin: 10.4 g/dL — ABNORMAL LOW (ref 13.0–17.0)
MCH: 25.1 pg — ABNORMAL LOW (ref 26.0–34.0)
MCHC: 33.7 g/dL (ref 30.0–36.0)
MCV: 74.5 fL — ABNORMAL LOW (ref 80.0–100.0)
Platelets: 376 10*3/uL (ref 150–400)
RBC: 4.15 MIL/uL — ABNORMAL LOW (ref 4.22–5.81)
RDW: 18.8 % — ABNORMAL HIGH (ref 11.5–15.5)
WBC: 11.5 10*3/uL — ABNORMAL HIGH (ref 4.0–10.5)
nRBC: 0 % (ref 0.0–0.2)

## 2023-01-19 LAB — BASIC METABOLIC PANEL
Anion gap: 10 (ref 5–15)
BUN: 16 mg/dL (ref 8–23)
CO2: 21 mmol/L — ABNORMAL LOW (ref 22–32)
Calcium: 8.4 mg/dL — ABNORMAL LOW (ref 8.9–10.3)
Chloride: 104 mmol/L (ref 98–111)
Creatinine, Ser: 0.78 mg/dL (ref 0.61–1.24)
GFR, Estimated: >60 mL/min (ref >60)
Glucose, Bld: 110 mg/dL — ABNORMAL HIGH (ref 70–99)
Potassium: 3.9 mmol/L (ref 3.5–5.1)
Sodium: 135 mmol/L (ref 135–145)

## 2023-01-19 LAB — PATHOLOGIST SMEAR REVIEW

## 2023-01-19 SURGERY — TOTAL HIP REVISION
Anesthesia: General | Site: Hip | Laterality: Left

## 2023-01-19 MED ORDER — EPHEDRINE 5 MG/ML INJ
INTRAVENOUS | Status: AC
  Filled 2023-01-19: qty 5

## 2023-01-19 MED ORDER — 0.9 % SODIUM CHLORIDE (POUR BTL) OPTIME
TOPICAL | Status: DC | PRN
  Administered 2023-01-19: 1000 mL

## 2023-01-19 MED ORDER — MIDAZOLAM HCL 2 MG/2ML IJ SOLN
INTRAMUSCULAR | Status: DC | PRN
  Administered 2023-01-19 (×2): 1 mg via INTRAVENOUS

## 2023-01-19 MED ORDER — SUCCINYLCHOLINE CHLORIDE 200 MG/10ML IV SOSY
PREFILLED_SYRINGE | INTRAVENOUS | Status: DC | PRN
  Administered 2023-01-19: 140 mg via INTRAVENOUS

## 2023-01-19 MED ORDER — HYDROCODONE-ACETAMINOPHEN 5-325 MG PO TABS
1.0000 | ORAL_TABLET | ORAL | Status: DC | PRN
  Administered 2023-01-19: 1 via ORAL
  Administered 2023-01-20 – 2023-01-21 (×2): 2 via ORAL
  Administered 2023-01-21: 1 via ORAL
  Administered 2023-01-22: 2 via ORAL
  Administered 2023-01-24 – 2023-01-26 (×2): 1 via ORAL
  Administered 2023-01-26 – 2023-01-27 (×2): 2 via ORAL
  Administered 2023-01-28: 1 via ORAL
  Administered 2023-01-28: 2 via ORAL
  Administered 2023-01-29: 1 via ORAL
  Filled 2023-01-19 (×4): qty 2
  Filled 2023-01-19: qty 1
  Filled 2023-01-19 (×4): qty 2
  Filled 2023-01-19 (×2): qty 1
  Filled 2023-01-19 (×2): qty 2
  Filled 2023-01-19: qty 1

## 2023-01-19 MED ORDER — ACETAMINOPHEN 10 MG/ML IV SOLN
INTRAVENOUS | Status: AC
  Filled 2023-01-19: qty 100

## 2023-01-19 MED ORDER — GENTAMICIN SULFATE 40 MG/ML IJ SOLN
INTRAMUSCULAR | Status: DC | PRN
  Administered 2023-01-19: 240 mg

## 2023-01-19 MED ORDER — GLYCOPYRROLATE 0.2 MG/ML IJ SOLN
INTRAMUSCULAR | Status: AC
  Filled 2023-01-19: qty 1

## 2023-01-19 MED ORDER — DEXAMETHASONE SODIUM PHOSPHATE 10 MG/ML IJ SOLN
INTRAMUSCULAR | Status: DC | PRN
  Administered 2023-01-19: 10 mg via INTRAVENOUS

## 2023-01-19 MED ORDER — FENTANYL CITRATE (PF) 100 MCG/2ML IJ SOLN
INTRAMUSCULAR | Status: AC
  Filled 2023-01-19: qty 2

## 2023-01-19 MED ORDER — SODIUM CHLORIDE 0.9 % IR SOLN
Status: DC | PRN
  Administered 2023-01-19: 3000 mL
  Administered 2023-01-19: 1000 mL
  Administered 2023-01-19: 100 mL
  Administered 2023-01-19: 3000 mL

## 2023-01-19 MED ORDER — METOCLOPRAMIDE HCL 5 MG PO TABS: 5.0000 mg | ORAL_TABLET | Freq: Three times a day (TID) | ORAL | Status: DC | PRN

## 2023-01-19 MED ORDER — HYDROMORPHONE HCL 1 MG/ML IJ SOLN
INTRAMUSCULAR | Status: DC | PRN
  Administered 2023-01-19: .5 mg via INTRAVENOUS

## 2023-01-19 MED ORDER — ROCURONIUM BROMIDE 100 MG/10ML IV SOLN
INTRAVENOUS | Status: DC | PRN
  Administered 2023-01-19: 20 mg via INTRAVENOUS
  Administered 2023-01-19: 40 mg via INTRAVENOUS
  Administered 2023-01-19: 5 mg via INTRAVENOUS

## 2023-01-19 MED ORDER — OXYCODONE HCL 5 MG/5ML PO SOLN: 5.0000 mg | Freq: Once | ORAL | Status: DC | PRN

## 2023-01-19 MED ORDER — LIDOCAINE HCL (PF) 2 % IJ SOLN
INTRAMUSCULAR | Status: AC
  Filled 2023-01-19: qty 5

## 2023-01-19 MED ORDER — FENTANYL CITRATE (PF) 100 MCG/2ML IJ SOLN
25.0000 ug | INTRAMUSCULAR | Status: DC | PRN
  Administered 2023-01-19 (×4): 25 ug via INTRAVENOUS

## 2023-01-19 MED ORDER — DEXAMETHASONE SODIUM PHOSPHATE 10 MG/ML IJ SOLN
INTRAMUSCULAR | Status: AC
  Filled 2023-01-19: qty 1

## 2023-01-19 MED ORDER — SODIUM CHLORIDE 0.9 % IV SOLN: INTRAVENOUS | Status: DC | PRN

## 2023-01-19 MED ORDER — VASOPRESSIN 20 UNIT/ML IV SOLN
INTRAVENOUS | Status: AC
  Filled 2023-01-19: qty 1

## 2023-01-19 MED ORDER — VANCOMYCIN HCL 1000 MG IV SOLR
INTRAVENOUS | Status: DC | PRN
  Administered 2023-01-19: 1000 mg

## 2023-01-19 MED ORDER — DEXMEDETOMIDINE HCL IN NACL 80 MCG/20ML IV SOLN
INTRAVENOUS | Status: DC | PRN
  Administered 2023-01-19 (×2): 4 ug via INTRAVENOUS

## 2023-01-19 MED ORDER — PHENYLEPHRINE 80 MCG/ML (10ML) SYRINGE FOR IV PUSH (FOR BLOOD PRESSURE SUPPORT)
PREFILLED_SYRINGE | INTRAVENOUS | Status: AC
  Filled 2023-01-19: qty 10

## 2023-01-19 MED ORDER — VASOPRESSIN 20 UNIT/ML IV SOLN
INTRAVENOUS | Status: DC | PRN
  Administered 2023-01-19 (×7): 1 [IU] via INTRAVENOUS
  Administered 2023-01-19: 2 [IU] via INTRAVENOUS
  Administered 2023-01-19 (×3): 1 [IU] via INTRAVENOUS

## 2023-01-19 MED ORDER — TRANEXAMIC ACID-NACL 1000-0.7 MG/100ML-% IV SOLN
INTRAVENOUS | Status: AC
  Filled 2023-01-19: qty 100

## 2023-01-19 MED ORDER — PHENYLEPHRINE 80 MCG/ML (10ML) SYRINGE FOR IV PUSH (FOR BLOOD PRESSURE SUPPORT)
PREFILLED_SYRINGE | INTRAVENOUS | Status: DC | PRN
  Administered 2023-01-19: 80 ug via INTRAVENOUS
  Administered 2023-01-19 (×4): 160 ug via INTRAVENOUS
  Administered 2023-01-19: 80 ug via INTRAVENOUS

## 2023-01-19 MED ORDER — ACETAMINOPHEN 10 MG/ML IV SOLN
INTRAVENOUS | Status: DC | PRN
  Administered 2023-01-19: 1000 mg via INTRAVENOUS

## 2023-01-19 MED ORDER — ROCURONIUM BROMIDE 10 MG/ML (PF) SYRINGE
PREFILLED_SYRINGE | INTRAVENOUS | Status: AC
  Filled 2023-01-19: qty 10

## 2023-01-19 MED ORDER — GENTAMICIN SULFATE 40 MG/ML IJ SOLN
INTRAMUSCULAR | Status: AC
  Filled 2023-01-19: qty 6

## 2023-01-19 MED ORDER — TRANEXAMIC ACID-NACL 1000-0.7 MG/100ML-% IV SOLN
INTRAVENOUS | Status: DC | PRN
Start: 2023-01-19 — End: 2023-01-19
  Administered 2023-01-19: 1000 mg via INTRAVENOUS

## 2023-01-19 MED ORDER — ACETAMINOPHEN 10 MG/ML IV SOLN: 1000.0000 mg | Freq: Once | INTRAVENOUS | Status: DC | PRN

## 2023-01-19 MED ORDER — PROPOFOL 10 MG/ML IV BOLUS
INTRAVENOUS | Status: AC
  Filled 2023-01-19: qty 20

## 2023-01-19 MED ORDER — SURGIPHOR WOUND IRRIGATION SYSTEM - OPTIME: TOPICAL | Status: DC | PRN

## 2023-01-19 MED ORDER — FENTANYL CITRATE (PF) 100 MCG/2ML IJ SOLN
INTRAMUSCULAR | Status: DC | PRN
  Administered 2023-01-19 (×2): 50 ug via INTRAVENOUS

## 2023-01-19 MED ORDER — SUGAMMADEX SODIUM 200 MG/2ML IV SOLN
INTRAVENOUS | Status: DC | PRN
  Administered 2023-01-19: 157.8 mg via INTRAVENOUS

## 2023-01-19 MED ORDER — OXYCODONE HCL 5 MG PO TABS: 5.0000 mg | ORAL_TABLET | Freq: Once | ORAL | Status: DC | PRN

## 2023-01-19 MED ORDER — MIDAZOLAM HCL 2 MG/2ML IJ SOLN
INTRAMUSCULAR | Status: AC
Start: 2023-01-19 — End: ?
  Filled 2023-01-19: qty 2

## 2023-01-19 MED ORDER — ONDANSETRON HCL 4 MG/2ML IJ SOLN
INTRAMUSCULAR | Status: AC
  Filled 2023-01-19: qty 2

## 2023-01-19 MED ORDER — VANCOMYCIN HCL 1000 MG IV SOLR
INTRAVENOUS | Status: AC
  Filled 2023-01-19: qty 20

## 2023-01-19 MED ORDER — MORPHINE SULFATE (PF) 2 MG/ML IV SOLN
0.5000 mg | INTRAVENOUS | Status: DC | PRN
  Administered 2023-01-27: 1 mg via INTRAVENOUS
  Filled 2023-01-19: qty 1

## 2023-01-19 MED ORDER — METOCLOPRAMIDE HCL 5 MG/ML IJ SOLN: 5.0000 mg | Freq: Three times a day (TID) | INTRAMUSCULAR | Status: DC | PRN

## 2023-01-19 MED ORDER — DEXMEDETOMIDINE HCL IN NACL 80 MCG/20ML IV SOLN
INTRAVENOUS | Status: AC
  Filled 2023-01-19: qty 20

## 2023-01-19 MED ORDER — HYDROMORPHONE HCL 1 MG/ML IJ SOLN
INTRAMUSCULAR | Status: AC
  Filled 2023-01-19: qty 1

## 2023-01-19 MED ORDER — PHENOL 1.4 % MT LIQD: 1.0000 | OROMUCOSAL | Status: DC | PRN

## 2023-01-19 MED ORDER — DOCUSATE SODIUM 100 MG PO CAPS
100.0000 mg | ORAL_CAPSULE | Freq: Two times a day (BID) | ORAL | Status: DC
  Administered 2023-01-19 – 2023-01-28 (×19): 100 mg via ORAL
  Filled 2023-01-19 (×20): qty 1

## 2023-01-19 MED ORDER — IRRISEPT - 450ML BOTTLE WITH 0.05% CHG IN STERILE WATER, USP 99.95% OPTIME
TOPICAL | Status: DC | PRN
  Administered 2023-01-19: 450 mL

## 2023-01-19 MED ORDER — PROPOFOL 10 MG/ML IV BOLUS
INTRAVENOUS | Status: DC | PRN
  Administered 2023-01-19: 150 mg via INTRAVENOUS

## 2023-01-19 MED ORDER — LIDOCAINE HCL (CARDIAC) PF 100 MG/5ML IV SOSY
PREFILLED_SYRINGE | INTRAVENOUS | Status: DC | PRN
  Administered 2023-01-19: 60 mg via INTRAVENOUS

## 2023-01-19 MED ORDER — EPHEDRINE SULFATE-NACL 50-0.9 MG/10ML-% IV SOSY
PREFILLED_SYRINGE | INTRAVENOUS | Status: DC | PRN
  Administered 2023-01-19 (×2): 5 mg via INTRAVENOUS

## 2023-01-19 MED ORDER — MENTHOL 3 MG MT LOZG: 1.0000 | LOZENGE | OROMUCOSAL | Status: DC | PRN

## 2023-01-19 SURGICAL SUPPLY — 86 items
BLADE SAGITTAL AGGR TOOTH XLG (BLADE) IMPLANT
BNDG COHESIVE 6X5 TAN ST LF (GAUZE/BANDAGES/DRESSINGS) ×1 IMPLANT
BOWL CEMENT MIX W/ADAPTER (MISCELLANEOUS) IMPLANT
BUR 4X45 EGG (BURR) IMPLANT
CANISTER WOUND CARE 500ML ATS (WOUND CARE) IMPLANT
CHLORAPREP W/TINT 26 (MISCELLANEOUS) ×2 IMPLANT
CNTNR URN SCR LID CUP LEK RST (MISCELLANEOUS) IMPLANT
COVER SET STULBERG POSITIONER (MISCELLANEOUS) ×1 IMPLANT
CUP MEDICINE 2OZ PLAST GRAD ST (MISCELLANEOUS) IMPLANT
DERMABOND ADVANCED .7 DNX12 (GAUZE/BANDAGES/DRESSINGS) IMPLANT
DRAPE IMP U-DRAPE 54X76 (DRAPES) ×1 IMPLANT
DRAPE INCISE IOBAN 66X60 STRL (DRAPES) ×1 IMPLANT
DRAPE SHEET LG 3/4 BI-LAMINATE (DRAPES) ×1 IMPLANT
DRAPE TABLE BACK 80X90 (DRAPES) ×1 IMPLANT
DRAPE U-SHAPE 47X51 STRL (DRAPES) ×1 IMPLANT
DRSG MEPILEX SACRM 8.7X9.8 (GAUZE/BANDAGES/DRESSINGS) ×1 IMPLANT
DRSG OPSITE POSTOP 4X12 (GAUZE/BANDAGES/DRESSINGS) IMPLANT
DRSG OPSITE POSTOP 4X14 (GAUZE/BANDAGES/DRESSINGS) IMPLANT
ELECT REM PT RETURN 9FT ADLT (ELECTROSURGICAL) ×1
ELECTRODE REM PT RTRN 9FT ADLT (ELECTROSURGICAL) ×1 IMPLANT
EVACUATOR 1/8 PVC DRAIN (DRAIN) IMPLANT
EVACUATOR DRAINAGE 10X20 100CC (DRAIN) IMPLANT
GLOVE BIO SURGEON STRL SZ8 (GLOVE) ×1 IMPLANT
GLOVE BIOGEL PI IND STRL 8 (GLOVE) ×1 IMPLANT
GLOVE PI ORTHO PRO STRL 7.5 (GLOVE) ×3 IMPLANT
GLOVE PI ORTHO PRO STRL SZ8 (GLOVE) ×3 IMPLANT
GLOVE SURG SYN 7.5 E (GLOVE) ×1 IMPLANT
GLOVE SURG SYN 7.5 PF PI (GLOVE) ×1 IMPLANT
GOWN SRG XL LVL 3 NONREINFORCE (GOWNS) ×1 IMPLANT
GOWN STRL REUS W/ TWL LRG LVL3 (GOWN DISPOSABLE) ×1 IMPLANT
GOWN STRL REUS W/ TWL XL LVL3 (GOWN DISPOSABLE) ×1 IMPLANT
HANDLE YANKAUER SUCT OPEN TIP (MISCELLANEOUS) ×1 IMPLANT
HANDPIECE VERSAJET DEBRIDEMENT (MISCELLANEOUS) IMPLANT
HEAD FEM BIOLOX DELTA UN 28 (Orthopedic Implant) IMPLANT
HOLDER FOLEY CATH W/STRAP (MISCELLANEOUS) ×1 IMPLANT
HOOD PEEL AWAY T7 (MISCELLANEOUS) ×2 IMPLANT
IV NS 1000ML BAXH (IV SOLUTION) IMPLANT
IV NS 100ML SINGLE PACK (IV SOLUTION) ×1 IMPLANT
IV NS IRRIG 3000ML ARTHROMATIC (IV SOLUTION) ×1 IMPLANT
KIT PREVENA INCISION MGT 13 (CANNISTER) IMPLANT
KIT STIMULAN RAPID CURE 5CC (Orthopedic Implant) IMPLANT
KIT TURNOVER KIT A (KITS) ×1 IMPLANT
LINER 42MM E (Orthopedic Implant) IMPLANT
LINER ADM MDM INS 28/48 42E (Liner) IMPLANT
MANIFOLD NEPTUNE II (INSTRUMENTS) ×1 IMPLANT
MAT ABSORB FLUID 56X50 GRAY (MISCELLANEOUS) ×1 IMPLANT
NDL FILTER BLUNT 18X1 1/2 (NEEDLE) IMPLANT
NDL SAFETY ECLIPSE 18X1.5 (NEEDLE) IMPLANT
NDL SPNL 20GX3.5 QUINCKE YW (NEEDLE) IMPLANT
NEEDLE FILTER BLUNT 18X1 1/2 (NEEDLE) IMPLANT
NEEDLE SPNL 20GX3.5 QUINCKE YW (NEEDLE) IMPLANT
NS IRRIG 500ML POUR BTL (IV SOLUTION) ×1 IMPLANT
PACK HIP PROSTHESIS (MISCELLANEOUS) ×1 IMPLANT
PENCIL SMOKE EVACUATOR (MISCELLANEOUS) ×1 IMPLANT
PILLOW ABDUCTION FOAM SM (MISCELLANEOUS) ×1 IMPLANT
PULSAVAC PLUS IRRIG FAN TIP (DISPOSABLE) ×2
RETRIEVER SUT HEWSON (MISCELLANEOUS) IMPLANT
SLEEVE FEM ADAPTER V-40 +0 (Sleeve) IMPLANT
SLEEVE SCD COMPRESS KNEE MED (STOCKING) ×1 IMPLANT
SOLUTION IRRIG SURGIPHOR (IV SOLUTION) ×1 IMPLANT
SPONGE T-LAP 18X18 ~~LOC~~+RFID (SPONGE) IMPLANT
STAPLER SKIN PROX 35W (STAPLE) IMPLANT
SUT BONE WAX W31G (SUTURE) IMPLANT
SUT ETHIBOND #5 BRAIDED 30INL (SUTURE) IMPLANT
SUT MON AB 2-0 CT1 36 (SUTURE) IMPLANT
SUT PDS AB 0 CT1 27 (SUTURE) IMPLANT
SUT PDS AB 1 CT1 27 (SUTURE) IMPLANT
SUT PROLENE 2 0 SH DA (SUTURE) IMPLANT
SUT SILK 3 0 SH 30 (SUTURE) IMPLANT
SUT STRATA 1 CT-1 DLB (SUTURE)
SUT STRATAFIX 14 PDO 48 VLT (SUTURE) IMPLANT
SUT STRATAFIX PDO 1 14 VIOLET (SUTURE)
SUT VIC AB 0 CT1 36 (SUTURE) IMPLANT
SUT VIC AB 2-0 CT2 27 (SUTURE) IMPLANT
SUT VICRYL 1-0 27IN ABS (SUTURE)
SUTURE STRATA SPIR 4-0 18 (SUTURE) IMPLANT
SUTURE VICRYL 1-0 27IN ABS (SUTURE) IMPLANT
SWAB CULTURE AMIES ANAERIB BLU (MISCELLANEOUS) IMPLANT
SYR 30ML LL (SYRINGE) IMPLANT
SYR TB 1ML LL NO SAFETY (SYRINGE) IMPLANT
TIP FAN IRRIG PULSAVAC PLUS (DISPOSABLE) ×1 IMPLANT
TOWEL OR 17X26 4PK STRL BLUE (TOWEL DISPOSABLE) IMPLANT
TRAP FLUID SMOKE EVACUATOR (MISCELLANEOUS) ×1 IMPLANT
TRAY FOLEY SLVR 16FR LF STAT (SET/KITS/TRAYS/PACK) ×1 IMPLANT
WAND WEREWOLF FASTSEAL 6.0 (MISCELLANEOUS) ×1 IMPLANT
WATER STERILE IRR 1000ML POUR (IV SOLUTION) ×1 IMPLANT

## 2023-01-19 NOTE — Care Management Important Message (Signed)
Important Message  Patient Details  Name: Nollie Dipirro MRN: [id number] Date of Birth: 26-Dec-1950   Important Message Given:  Yes - Medicare IM     Bernadette Hoit 01/19/2023, 12:15 PM

## 2023-01-19 NOTE — Transfer of Care (Signed)
Immediate Anesthesia Transfer of Care Note  Patient: Adrian Gillespie  Procedure(s) Performed: HIP REVISION, HEAD AND LINER EXCHANGE (Left: Hip) IRRIGATION AND DEBRIDEMENT THIGH (Left: Hip)  Patient Location: PACU  Anesthesia Type:General  Level of Consciousness: awake and alert   Airway & Oxygen Therapy: Patient Spontanous Breathing and Patient connected to face mask oxygen  Post-op Assessment: Report given to RN and Post -op Vital signs reviewed and stable  Post vital signs: Reviewed and stable  Last Vitals:  Vitals Value Taken Time  BP 93/63 01/19/23 1530  Temp    Pulse 94 01/19/23 1532  Resp 12 01/19/23 1532  SpO2 93 % 01/19/23 1532  Vitals shown include unfiled device data.  Last Pain:  Vitals:   01/19/23 1126  TempSrc: Temporal  PainSc: 0-No pain         Complications: No notable events documented.

## 2023-01-19 NOTE — Anesthesia Postprocedure Evaluation (Signed)
Anesthesia Post Note  Patient: Adrian Gillespie  Procedure(s) Performed: HIP REVISION, HEAD AND LINER EXCHANGE (Left: Hip) IRRIGATION AND DEBRIDEMENT THIGH (Left: Hip)  Patient location during evaluation: PACU Anesthesia Type: General Level of consciousness: awake and alert Pain management: pain level controlled Vital Signs Assessment: post-procedure vital signs reviewed and stable Respiratory status: spontaneous breathing, nonlabored ventilation, respiratory function stable and patient connected to nasal cannula oxygen Cardiovascular status: blood pressure returned to baseline and stable Postop Assessment: no apparent nausea or vomiting Anesthetic complications: no  No notable events documented.   Last Vitals:  Vitals:   01/19/23 1716 01/19/23 1722  BP: 94/63 (!) 89/63  Pulse: 88 82  Resp:    Temp: (!) 36.4 C   SpO2: 97% 95%    Last Pain:  Vitals:   01/19/23 1716  TempSrc: Oral  PainSc:                  Stephanie Coup

## 2023-01-19 NOTE — Anesthesia Procedure Notes (Signed)
Procedure Name: Intubation Date/Time: 01/19/2023 12:51 PM  Performed by: Malva Cogan, CRNAPre-anesthesia Checklist: Patient identified, Patient being monitored, Timeout performed, Emergency Drugs available and Suction available Patient Re-evaluated:Patient Re-evaluated prior to induction Oxygen Delivery Method: Circle system utilized Preoxygenation: Pre-oxygenation with 100% oxygen Induction Type: IV induction Ventilation: Mask ventilation without difficulty Laryngoscope Size: McGrath and 4 Grade View: Grade I Tube type: Oral Tube size: 7.5 mm Number of attempts: 1 Airway Equipment and Method: Stylet and Video-laryngoscopy Placement Confirmation: ETT inserted through vocal cords under direct vision, positive ETCO2 and breath sounds checked- equal and bilateral Secured at: 22 cm Tube secured with: Tape Dental Injury: Teeth and Oropharynx as per pre-operative assessment

## 2023-01-19 NOTE — Anesthesia Preprocedure Evaluation (Signed)
Anesthesia Evaluation  Patient identified by MRN, date of birth, ID band Patient awake    Reviewed: Allergy & Precautions, H&P , NPO status , Patient's Chart, lab work & pertinent test results, reviewed documented beta blocker date and time   Airway Mallampati: II  TM Distance: >3 FB Neck ROM: full    Dental  (+) Teeth Intact   Pulmonary asthma , pneumonia, COPD,  COPD inhaler, Current Smoker   Pulmonary exam normal        Cardiovascular Exercise Tolerance: Poor hypertension, On Medications negative cardio ROS Atrial Fibrillation  Rhythm:regular Rate:Normal     Neuro/Psych  PSYCHIATRIC DISORDERS Anxiety Depression  Schizophrenia  negative neurological ROS     GI/Hepatic Neg liver ROS,GERD  Medicated,,  Endo/Other  negative endocrine ROS    Renal/GU negative Renal ROS  negative genitourinary   Musculoskeletal   Abdominal   Peds  Hematology negative hematology ROS (+)   Anesthesia Other Findings Past Medical History: No date: Anxiety No date: At risk for sexually transmitted disease due to partner with  HIV No date: COPD (chronic obstructive pulmonary disease) (HCC) No date: Depression No date: GERD (gastroesophageal reflux disease) No date: 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) No date: HLD (hyperlipidemia) No date: HTN (hypertension) No date: Schizophrenia [facility]) Past Surgical History: 09/19/2022: INTRAMEDULLARY (IM) NAIL INTERTROCHANTERIC; Right     Comment:  Procedure: INTRAMEDULLARY (IM) NAIL INTERTROCHANTERIC;                Surgeon: Reinaldo Berber, MD;  Location: [facility] ORS;                Service: Orthopedics;  Laterality: Right; 01/18/2023: IR IMAGE GUIDED FLUID DRAIN BY CATHETER BMI    Body Mass Index: 25.70 kg/m     Reproductive/Obstetrics negative OB ROS                             Anesthesia Physical Anesthesia Plan  ASA: 3 and emergent  Anesthesia Plan: General ETT    Post-op Pain Management:    Induction:   PONV Risk Score and Plan: 2  Airway Management Planned:   Additional Equipment:   Intra-op Plan:   Post-operative Plan:   Informed Consent: I have reviewed the patients History and Physical, chart, labs and discussed the procedure including the risks, benefits and alternatives for the proposed anesthesia with the patient or authorized representative who has indicated his/her understanding and acceptance.     Dental Advisory Given  Plan Discussed with: CRNA  Anesthesia Plan Comments:        Anesthesia Quick Evaluation

## 2023-01-19 NOTE — Hospital Course (Addendum)
HPI: Adrian Gillespie is a 72 y.o. male with medical history significant of HTN, HLD, COPD, asthma, GERD, depression, anxiety, schizophrenia, HBV, tobacco abuse, who presents to ED from assisted living facility with AMS, fever and fall.   Hospital course / significant events:  12/17: admitted to hospitalist service, sepsis 12/18: EEG, MRI brain no acute concerns 12/19: BCx (+)Ecoli, ID consulted, plan further imaging to delineate source. Psychiatry consulted for med management 12/20: Afib RVR, resolved. Started low dose beta blocker. CT abd/pelv concern for discitis L3/L4, noted large L hip effusion 12/21: MRI L spine no discitis/osteo 12/22: aspiration L hip effusion w/ concern for purulent fluid, ortho plan for OR tomorrow  12/23: to OR for joint washout L hip. 12/24-12/26: stable postop, leaving drains in place for now. Will need long term IV abx so SNF placement is underway  12/27: drains out, placement pending, expect will be here thru the weekend (today is Friday).    Consultants:  Neurology - informal discussion 12/18 w/ recs EEG and MRI, avoid cefepime  Infectious disease  Psychiatry  Orthopedics   Procedures/Surgeries: 01/18/23: L hip/thigh abscess aspiration w/ drain placement  01/19/23: Left hip and thigh irrigation and debridement with revision head and liner exchange with placement of custom made antibiotic beads       ASSESSMENT & PLAN:    E coli bacteremia No abdominal pain or diarrhea to suggest intra-abdominal pathology though patient unable to provide accurate history. Not retaining urine - PVR is 0. CT abdomen no intra-abdominal infection. Denied hip pain but CT of abdomen incidentally showed large left hip effusion and IR aspiration revealed purulent fluid in the left hip tracking into the thigh. S/P Debridement and head and liner replacement  continue ceftriaxone - will need up to 12 weeks IV abx per ID ID following    Left hip septic arthritis Large effusion,  incidental on CT. IR aspiration encountered purulent fluid in the left hip tracking into the [facility]o performed wash-out 12/23  Orthopedics following  WBAT LLE Remove drains today per ortho Remove provena and apply honeycomb dressing 7 days after discharge  Follow up in office with [facility] in 2 weeks Lovenox 40 mg SQ daily x 14 days at discharge  Subacute compression fracture On MRI. CT showed possible signs infection but none seen on mri Pain control as needed  Urinary retention Foley out  In/out cath as needed  Acute encephalopathy w/ seizure concern, seizures reasonably ruled out  Encephalopathy more likely metabolic d/t sepsis - improved/resolved Report of fall day prior to admission, shaking, and confusion afterward that has now resolved. Discussed w/ neuro, hard to tell what's going on here. They advised mri (nothing acute) and eeg (no seizure-like activity).  clinical monitoring   Frequent falls Likely multifactorial PT consulteded, thinks he is close to his baseline, advising return to ALF though will need to be re-evaluated after surgery   Paroxysmal a-fib With rvr morning of 12/20, resolved. Chads2vasc 1 not anticoagulated, nor is he on a rate/rhythm control agent low-dose bb. Dig is an option but not a great one. Currently tolerating coreg but soft BP.   Hyponatremia Likely hypovolemic as has resolved  with hydration Relatively stable off fluids but still low, encourage po hydration  monitor BMP   Hypokalemia hypomagnesmia replete and monitor  QTC prolongation Likely 2/2 psych meds exacerbated by electrolyte abnormalities, has improved on most recent EKG treat electrolyte abnormalities as above   COPD Quiescent home inhalers   Hypotension Likely multifactorial. Meds  likely contributing but needs beta blocker for rate control given recent RVR.  continue midodrine beta blocker rate control    Schizophrenia Ck wnl. Intermittent agitation, none the last  few days continue home haldol on monthly invega as well trazodone at bedtime   Chronic pain stable Pain control as needed      DVT prophylaxis: holding Rx PPx perioperatively IV fluids: no continuous IV fluids  Nutrition: regular diet Central lines / invasive devices: drain x2 L hip,  wound vac L hip   Code Status: FULL CODE ACP documentation reviewed:  none on file in [facility]  TOC needs: SNF Barriers to dispo / significant pending items: wound vac, drains, monitoring postop, continuing IV abx, will not be able to go back to ALF on IV abx, SNF placement pending

## 2023-01-19 NOTE — Progress Notes (Signed)
Date of Admission:  01/13/2023    ID: Adrian Gillespie is a 72 y.o. male  Principal Problem:   SIRS (systemic inflammatory response syndrome) (HCC) Active Problems:   Hypokalemia   Schizophrenia (HCC)   Chronic obstructive pulmonary disease (HCC)   HTN (hypertension)   AF (paroxysmal atrial fibrillation) (HCC)   Fall at home, initial encounter   Hyponatremia   Prolonged QT interval   Electrolyte disturbance   Hypophosphatemia   Acute metabolic encephalopathy   Left shoulder pain   Hypomagnesemia   Hypotension   E coli bacteremia   Sepsis without acute organ dysfunction (HCC)   Abscess of hip, left    Subjective: Underwent surgery today resting  Medications:   benztropine  1 mg Oral Daily   carvedilol  3.125 mg Oral BID WC   docusate sodium  100 mg Oral BID   feeding supplement  237 mL Oral BID BM   haloperidol  10 mg Oral BID   midodrine  10 mg Oral TID WC   mometasone-formoterol  2 puff Inhalation BID   montelukast  10 mg Oral Daily   multivitamin with minerals  1 tablet Oral Daily   nicotine  21 mg Transdermal Daily   pantoprazole  40 mg Oral Daily   sodium chloride flush  5 mL Intracatheter Q8H   tamsulosin  0.4 mg Oral QPC supper   traZODone  300 mg Oral QHS    Objective: Vital signs in last 24 hours: Patient Vitals for the past 24 hrs:  BP Temp Temp src Pulse Resp SpO2 Height Weight  01/19/23 1735 100/79 -- -- 92 -- 95 % -- --  01/19/23 1733 (!) 86/61 98 F (36.7 C) -- 84 15 96 % -- --  01/19/23 1731 100/79 -- -- -- -- -- -- --  01/19/23 1722 (!) 89/63 -- -- 82 -- 95 % -- --  01/19/23 1716 94/63 (!) 97.5 F (36.4 C) Oral 88 -- 97 % -- --  01/19/23 1706 97/76 (!) 97.5 F (36.4 C) Oral 89 -- 96 % -- --  01/19/23 1703 96/67 (!) 97.5 F (36.4 C) Oral 87 -- 97 % -- --  01/19/23 1650 95/70 -- -- 91 15 93 % -- --  01/19/23 1645 98/67 -- -- 88 15 96 % -- --  01/19/23 1640 -- -- -- 94 18 94 % -- --  01/19/23 1635 -- -- -- 92 14 93 % -- --  01/19/23 1630  97/67 97.7 F (36.5 C) -- 94 (!) 9 96 % -- --  01/19/23 1625 -- -- -- 95 10 95 % -- --  01/19/23 1620 -- -- -- 98 12 96 % -- --  01/19/23 1615 91/64 -- -- 95 20 92 % -- --  01/19/23 1610 -- -- -- 97 13 92 % -- --  01/19/23 1605 -- -- -- 100 13 96 % -- --  01/19/23 1600 104/71 -- -- 98 10 94 % -- --  01/19/23 1555 -- -- -- (!) 105 14 94 % -- --  01/19/23 1550 -- -- -- 99 13 93 % -- --  01/19/23 1545 97/64 -- -- 93 14 92 % -- --  01/19/23 1540 -- -- -- (!) 101 15 (!) 88 % -- --  01/19/23 1535 92/74 -- -- (!) 104 11 95 % -- --  01/19/23 1534 (!) 88/58 -- -- 96 15 93 % -- --  01/19/23 1530 93/63 -- -- 94 14 93 % -- --  01/19/23 1529 Marland Kitchen)  82/55 -- -- 95 18 93 % -- --  01/19/23 1527 (!) 80/55 98.1 F (36.7 C) -- 96 19 94 % -- --  01/19/23 1126 101/73 98.8 F (37.1 C) Temporal 68 18 94 % 5\' 11"  (1.803 m) 78.9 kg  01/19/23 0832 104/69 97.8 F (36.6 C) -- 70 18 -- -- --  01/19/23 0420 115/79 98.8 F (37.1 C) Oral 75 -- 94 % -- --  01/18/23 2036 104/79 97.8 F (36.6 C) Oral 82 16 93 % -- --      PHYSICAL EXAM:  General:sleeping  Lungs: Clear to auscultation bilaterally. No Wheezing or Rhonchi. No rales. Heart: Regular rate and rhythm, no murmur, rub or gallop. Abdomen: Soft, non-tender,not distended. Bowel sounds normal. No masses Extremities:left hip surgical dressing Drain Neurologic: Grossly non-focal  Lab Results    Latest Ref Rng & Units 01/19/2023    5:45 AM 01/14/2023    6:57 AM 01/13/2023    9:29 PM  CBC  WBC 4.0 - 10.5 K/uL 11.5  12.9  12.7   Hemoglobin 13.0 - 17.0 g/dL 64.4  03.4  74.2   Hematocrit 39.0 - 52.0 % 30.9  31.7  30.4   Platelets 150 - 400 K/uL 376  263  284        Latest Ref Rng & Units 01/19/2023    5:45 AM 01/17/2023    5:14 AM 01/16/2023    5:18 AM  CMP  Glucose 70 - 99 mg/dL 595  638  756   BUN 8 - 23 mg/dL 16  11  12    Creatinine 0.61 - 1.24 mg/dL 4.33  2.95  1.88   Sodium 135 - 145 mmol/L 135  136  138   Potassium 3.5 - 5.1 mmol/L 3.9  3.8   3.9   Chloride 98 - 111 mmol/L 104  105  105   CO2 22 - 32 mmol/L 21  24  25    Calcium 8.9 - 10.3 mg/dL 8.4  8.2  8.0       Microbiology: Ecoli blood culture  Studies/Results: IR IMAGE GUIDED FLUID DRAIN BY CATHETER Result Date: 01/18/2023 INDICATION: 72 year old gentleman with bacteremia and multiloculated left hip and thigh abscesses presents to interventional radiology for imaging guided drain placement. EXAM: Ultrasound and fluoroscopy guided left lateral hip abscess drain placement MEDICATIONS: The patient is currently admitted to the hospital and receiving intravenous antibiotics. The antibiotics were administered within an appropriate time frame prior to the initiation of the procedure. ANESTHESIA/SEDATION: None COMPLICATIONS: None immediate. PROCEDURE: Informed written consent was obtained from the patient after a thorough discussion of the procedural risks, benefits and alternatives. All questions were addressed. Maximal Sterile Barrier Technique was utilized including caps, mask, sterile gowns, sterile gloves, sterile drape, hand hygiene and skin antiseptic. A timeout was performed prior to the initiation of the procedure. Ultrasound examination of the left lateral hip region confirmed presence of large fluid collection. The overlying skin was prepped and draped in the usual sterile fashion. Following local administration, the fluid collection was accessed with 19 gauge Yueh needle using continuous ultrasound guidance. The Yueh catheter was removed over 0.035 inch Amplatz guidewire replaced with 10.2 [place] multipurpose pigtail drain. Limited Dyna CT of the region was performed and confirmed appropriate positioning within the collection. 60 mL of purulent material was aspirated and sent for Gram stain and culture. There did not appear to be significant decrease in size of the left anterior hip fluid collection with aspiration of the lateral hip collection. Therefore decision  was made to  attempt placement of a drain within this collection. The overlying skin was prepped and draped in usual fashion. Following local administration, this collection was accessed with a 19 gauge Yueh needle utilizing continuous ultrasound guidance. The Yueh catheter was removed over 0.035 inch guidewire. However, I was not able to advance a 10 [place] drain into the collection utilizing fluoroscopic guidance. Hemorrhage was seen along the catheter tract therefore the procedure was abandoned and 15 minutes of manual compression was applied to the site to confirm hemostasis. IMPRESSION: 1. Successful insertion of 10.2 French abscess drain in the left lateral hip collection. 60 mL of purulent material aspirated and sent for Gram stain and culture. 2. Unsuccessful insertion of drain in anterior hip fluid collection. Collection was accessed with 19 gauge Yueh needle, however a drain could not be advanced into the collection. Electronically Signed   By: Acquanetta Belling M.D.   On: 01/18/2023 20:42     Assessment/Plan: Encephalopathy- metabolic resolved   Sepsis secondary to ecoli Has ecoli bacteremia Left PJI- S/P Debridement and head and liner replacement  continue ceftriaxone- will need up to 12 weeks of antibiotic-   Has urinary incontinence Bladder scan 350cc  CT scan questioned discitis in the lumbar spine MRI no evidence of discitis    Bladder distension in CT With incontinence, r/o incomplete empyting     Fall- pain left shoulder- xray N   Anemia   Hyponatremia     ?COPD   Schizophrenia  Discussed the management with care team

## 2023-01-19 NOTE — Op Note (Signed)
Patient Name: Adrian Gillespie  ZOX:[id number]  Pre-Operative Diagnosis: Left thigh and soft tissue abscess, left hip prosthetic joint infection  Post-Operative Diagnosis: (same)  Procedure: Left hip and thigh irrigation and debridement with revision head and liner exchange with placement of custom made antibiotic beads  Components/Implants: Cup: N/A    Liner: MDM 42/E  Stem: N/A  Head: Biolox ceramic 28 with +37mm adapter sleeve, outer ADM/MDM 28/48/42E  Date of Surgery: 01/19/2023  Surgeon: Reinaldo Berber MD  Assistant: Amador Cunas PA (present and scrubbed throughout the case, critical for assistance with exposure, retraction, instrumentation, and closure)   Anesthesiologist: Adams  Anesthesia: General   IVF:900cc  EBL: 200cc  Complications: None   Brief history: The patient is a 72 year old male who presented with altered mental status and was found to be bacteremic with unknown source.  After workup he was found to have a left hip and thigh abscess and joint effusion status post total hip arthroplasty.  All preoperative films were reviewed and an appropriate surgical plan was made prior to surgery.   Description of procedure: The patient was brought to the operating room where laterality was confirmed by all those present to be the left side.  The patient was moved to the table and administered general anesthesia. Patient was given an intravenous dose of antibiotics for surgical prophylaxis and TXA . The patient was positioned in lateral decubitus position with all bony prominences well-padded.  Surgical site was prepped with alcohol and chlorhexidine.  Surgical site over the hip was draped in typical sterile fashion with multiple layers of adhesive and nonadhesive drapes.  The incision site over the greater trochanter posteriorly was marked out with a sterile marker.   Surgical timeout was then called with participation of all staff in the room the patient was confirmed and  laterality again confirmed.    An incision was made in line with the prior surgical incision over the posterior lateral hip.  Careful soft tissue dissection with coagulation of all bleeders was taken down to the level of the fascia.  The fascia was then opened and a large fluid-filled cavity was encountered with purulent fluid which then followed closely by a very large amount of globular tissue consistent with fat and fibrous tissue was expressed from the cavity.  Nearly 2 L of globular tissue was expressed from the hip joint space a large anterior recess over the anterior thigh deep to the quadriceps and up between the gluteus maximus and gluteus medius muscles up towards the ileum.  All of the spaces were carefully accessed and all of the tissue was bluntly debrided and removed.  The hip was open to this space with no posterior capsular closure.  A dull Cobra retractor was placed under the abductor mechanism to protect the mechanism and fully expose the hip joint.  There were no external rotators or capsule to be tagged.  A complete synovectomy/scar removal around the proximal femur and the acetabular component was then performed.  There was good healthy bleeding tissue encountered all around the hip.  The hip was then carefully dislocated.  A tamp was used to attempt to dissociate the femoral head from the femoral component.  After carefully removing all the excess soft tissue around the proximal femur our attention was then turned to the acetabulum.  A small anterior recess was created to allow anterior retraction of the femur to access the entire acetabulum.  All of the anterior soft tissue on the rim of the acetabular component was  removed fully exposing the acetabular component 360 degrees.  The acetabular liner was then carefully removed using the acetabular tool.  The interfaces between the femoral component and the bone and the acetabular component the bone were then fully cleared and there was found  to be no evidence of osteolysis around the components and after pulling on the components no evidence of any motion or loosening.  No evidence of any osteomyelitis around the proximal femur or the acetabular components.  The hip was then carefully debrided again with blunt debridement and electrocautery and all the interfaces of the metal and all the metal and bone surfaces were then carefully cleaned with Versajet.  The hip the anterior thigh recess and the superior gluteal recess were then irrigated again as well as a small recess which was found deep, medialm and posterior to the lesser trochanter.  There was no remaining globular fatty tissues remaining and all purulent areas were carefully debrided and washed with over 3 L of normal saline.  The hip was then left to soak with surgiphor Betadine based solution.  While soaking a new drape was placed over the hip and the leg was covered.  The Bovie, suction, pulsatile irrigator were removed and the field and new drapes were applied over the top of the field.  All surgical staff changed their gown and gloves and new Bovie and suction were set up.  The hip was then irrigated with pulsatile lavage to remove all the Betadine as well as in the abscess spaces.  A new liner was then placed impacted and checked for stability a trial ball was put on the femur and the hip was taken through range of motion and found to be stable up to 90 degrees of flexion and 85 degrees of internal rotation without dislocation or impingement.  A real dual mobility ball was opened assembled and impacted.  It was checked for stability and found to be stable.  The hip was carefully reduced and again taken through range of motion found to be stable on exam.  A flat drain was then placed in the anterior thigh recess and sewn to the skin with a silk stitch.  A Hemovac was also placed in the hip joint itself deep to the fascia coming out the anterior thigh.  The hip was then irrigated again  with Irrisept solution which was allowed to soak and then irrigated again with normal saline.  Antibiotic beads with a stimulant kit mixed with vancomycin and gentamicin were then placed within the hip joint and the abscess spaces  The fascia was then approximated with #1 PDS and closed with interrupted figure-of-eight #1 PDS sutures.  The subcutaneous tissues were closed with 0 PDS 2-0 Monocryl and the skin was closed with staples.  A sterile negative pressure dressing was then applied and suction was found to be adequate.  Sterile dressings were also applied to the drain sites and to the previous stab incisions from the interventional radiology drain.   Lap, sharps, and sponge counts were correct at the end of the case.    The patient was then rolled supine and an x-ray was taken in the operating room with the operating room table kept Sterile. Leg lengths were clinically equal on examination with a good distal pulse. components appeared in good position with no fractures noted on x-ray.  Patient was then transferred to a hospital bed and transferred to the recovery room in stable condition.

## 2023-01-19 NOTE — Progress Notes (Signed)
PROGRESS NOTE    Adrian Gillespie   FAO:[id number] DOB: 08-May-1950  DOA: 01/13/2023 Date of Service: 01/19/23 which is hospital day 6  PCP: Andres Shad, MD    HPI: Adrian Gillespie is a 72 y.o. male with medical history significant of HTN, HLD, COPD, asthma, GERD, depression, anxiety, schizophrenia, HBV, tobacco abuse, who presents to ED from assisted living facility with AMS, fever and fall.   Hospital course / significant events:  12/17: admitted to hospitalist service, sepsis 12/18: EEG, MRI brain no acute concerns 12/19: BCx (+)Ecoli, ID consulted, plan further imaging to delineate source. Psychiatry consulted for med management 12/20: Afib RVR, resolved. Started low dose beta blocker. CT abd/pelv concern for discitis L3/L4, noted large L hip effusion 12/21: MRI L spine no discitis/osteo 12/22: aspiration L hip effusion w/ concern for purulent fluid, ortho plan for OR tomorrow  12/23: to OR for joint washout L hip   Consultants:  Neurology - informal discussion 12/18 w/ recs EEG and MRI, avoid cefepime  Infectious disease  Psychiatry  Orthopedics   Procedures/Surgeries: 01/18/23: L hip/thigh abscess aspiration w/ drain placement  01/19/23: Left hip and thigh irrigation and debridement with revision head and liner exchange with placement of custom made antibiotic beads       ASSESSMENT & PLAN:    E coli bacteremia No abdominal pain or diarrhea to suggest intra-abdominal pathology though patient unable to provide accurate history. Not retaining urine - PVR is 0. CT abdomen no intra-abdominal infection. Denied hip pain but CT of abdomen incidentally showed large left hip effusion and IR aspiration revealed purulent fluid in the left hip tracking into the thigh continue ceftriaxone ID following  See below    Left hip septic arthritis Large effusion, incidental on CT. IR aspiration encountered purulent fluid in the left hip tracking into the [facility] ortho performed  wash-out today    Subacute compression fracture On MRI. CT showed possible signs infection but none seen on mri Pain control as needed  Acute encephalopathy w/ seizure concern, seizures reasonably ruled out  Encephalopathy more likely metabolic d/t sepsis  Report of fall day prior to admission, shaking, and confusion afterward that has now resolved. Discussed w/ neuro, hard to tell what's going on here. They advised mri (nothing acute) and eeg (no seizure-like activity).  clinical monitoring   Frequent falls Likely multifactorial PT consulteded, thinks he is close to his baseline, advising return to ALF though will need to be re-evaluated after surgery   Paroxysmal a-fib With rvr morning of 12/20, resolved. Chads2vasc 1 not anticoagulated, nor is he on a rate/rhythm control agent low-dose bb. Dig is an option but not a great one. Currently tolerating coreg but soft BP.   Hyponatremia Likely hypovolemic as has resolved  with hydration monitor off fluids   Hypokalemia hypomagnesmia replete and monitor  QTC prolongation Likely 2/2 psych meds exacerbated by electrolyte abnormalities, has improved on most recent EKG treat electrolyte abnormalities as above   COPD Quiescent home inhalers   Hypotension Likely multifactorial. Meds likely contributing but needs beta blocker for rate control given recent RVR.  continue midodrine Caution w/ beta blocker rate control    Schizophrenia Ck wnl. Intermittent agitation, none the last few days continue home haldol on monthly invega as well trazodone at bedtime   Chronic pain stable Pain control as needed      DVT prophylaxis: holding Rx PPx perioperatively IV fluids: no continuous IV fluids  Nutrition: regular diet Central lines / invasive devices: drain x2  L hip, Foley catheter , wound vac L hip   Code Status: FULL CODE ACP documentation reviewed:  none on file in [facility]  TOC needs: TBD Barriers to dispo / significant  pending items: wound vac, drains, monitoring postop, continuing IV abx, may not be able to go back to ALF on IV abx may need SNF              Subjective / Brief ROS:  Patient resting postoperatively no concerns  No concerns from RN   Family Communication: none at this time     Objective Findings:  Vitals:   01/19/23 1731 01/19/23 1733 01/19/23 1735 01/19/23 1834  BP: 100/79 (!) 86/61 100/79 (!) 87/65  Pulse:  84 92 83  Resp:  15  16  Temp:  98 F (36.7 C)  98 F (36.7 C)  TempSrc:      SpO2:  96% 95% 98%  Weight:      Height:        Intake/Output Summary (Last 24 hours) at 01/19/2023 1848 Last data filed at 01/19/2023 1640 Gross per 24 hour  Intake 1030 ml  Output 2700 ml  Net -1670 ml   Filed Weights   01/13/23 2200 01/19/23 1126  Weight: 78.9 kg 78.9 kg    Examination:  Physical Exam Constitutional:      General: He is not in acute distress. Cardiovascular:     Rate and Rhythm: Normal rate and regular rhythm.  Pulmonary:     Effort: Pulmonary effort is normal.     Breath sounds: Normal breath sounds.  Musculoskeletal:     Right lower leg: No edema.     Left lower leg: No edema.  Skin:    General: Skin is warm and dry.          Scheduled Medications:   benztropine  1 mg Oral Daily   carvedilol  3.125 mg Oral BID WC   docusate sodium  100 mg Oral BID   feeding supplement  237 mL Oral BID BM   haloperidol  10 mg Oral BID   midodrine  10 mg Oral TID WC   mometasone-formoterol  2 puff Inhalation BID   montelukast  10 mg Oral Daily   multivitamin with minerals  1 tablet Oral Daily   nicotine  21 mg Transdermal Daily   pantoprazole  40 mg Oral Daily   sodium chloride flush  5 mL Intracatheter Q8H   tamsulosin  0.4 mg Oral QPC supper   traZODone  300 mg Oral QHS    Continuous Infusions:  cefTRIAXone (ROCEPHIN)  IV 2 g (01/18/23 1226)    PRN Medications:  acetaminophen, albuterol, dextromethorphan-guaiFENesin, diphenhydrAMINE,  HYDROcodone-acetaminophen, hydrOXYzine, LORazepam, magnesium hydroxide, menthol-cetylpyridinium **OR** phenol, metoCLOPramide **OR** metoCLOPramide (REGLAN) injection, morphine injection  Antimicrobials from admission:  Anti-infectives (From admission, onward)    Start     Dose/Rate Route Frequency Ordered Stop   01/19/23 1403  gentamicin (GARAMYCIN) injection  Status:  Discontinued          As needed 01/19/23 1403 01/19/23 1523   01/19/23 1402  vancomycin (VANCOCIN) powder  Status:  Discontinued          As needed 01/19/23 1402 01/19/23 1523   01/14/23 2300  vancomycin (VANCOREADY) IVPB 1750 mg/350 mL  Status:  Discontinued        1,750 mg 175 mL/hr over 120 Minutes Intravenous Every 24 hours 01/14/23 0352 01/14/23 0840   01/14/23 1400  cefTRIAXone (ROCEPHIN) 2 g in sodium chloride  0.9 % 100 mL IVPB        2 g 200 mL/hr over 30 Minutes Intravenous Daily 01/14/23 1203     01/14/23 0900  metroNIDAZOLE (FLAGYL) IVPB 500 mg  Status:  Discontinued        500 mg 100 mL/hr over 60 Minutes Intravenous  Once 01/14/23 0124 01/14/23 0840   01/14/23 0700  ceFEPIme (MAXIPIME) 2 g in sodium chloride 0.9 % 100 mL IVPB  Status:  Discontinued        2 g 200 mL/hr over 30 Minutes Intravenous Every 8 hours 01/14/23 0349 01/14/23 0840   01/13/23 2215  vancomycin (VANCOREADY) IVPB 1750 mg/350 mL  Status:  Discontinued        1,750 mg 175 mL/hr over 120 Minutes Intravenous  Once 01/13/23 2208 01/13/23 2211   01/13/23 2215  vancomycin (VANCOCIN) IVPB 1000 mg/200 mL premix       Placed in "Followed by" Linked Group   1,000 mg 200 mL/hr over 60 Minutes Intravenous  Once 01/13/23 2211 01/14/23 0021   01/13/23 2215  vancomycin (VANCOREADY) IVPB 750 mg/150 mL       Placed in "Followed by" Linked Group   750 mg 150 mL/hr over 60 Minutes Intravenous  Once 01/13/23 2211 01/14/23 0120   01/13/23 2130  ceFEPIme (MAXIPIME) 2 g in sodium chloride 0.9 % 100 mL IVPB        2 g 200 mL/hr over 30 Minutes Intravenous   Once 01/13/23 2123 01/13/23 2319   01/13/23 2130  metroNIDAZOLE (FLAGYL) IVPB 500 mg        500 mg 100 mL/hr over 60 Minutes Intravenous  Once 01/13/23 2123 01/13/23 2319   01/13/23 2130  vancomycin (VANCOCIN) IVPB 1000 mg/200 mL premix  Status:  Discontinued        1,000 mg 200 mL/hr over 60 Minutes Intravenous  Once 01/13/23 2123 01/13/23 2207           Data Reviewed:  I have personally reviewed the following...  CBC: Recent Labs  Lab 01/13/23 2129 01/14/23 0657 01/19/23 0545  WBC 12.7* 12.9* 11.5*  NEUTROABS 11.6*  --   --   HGB 10.2* 10.5* 10.4*  HCT 30.4* 31.7* 30.9*  MCV 73.4* 74.8* 74.5*  PLT 284 263 376   Basic Metabolic Panel: Recent Labs  Lab 01/13/23 2128 01/13/23 2129 01/13/23 2333 01/14/23 0657 01/15/23 0447 01/16/23 0518 01/17/23 0514 01/19/23 0545  NA  --    < > 128* 138 133* 138 136 135  K  --    < > 3.1* 4.6 4.1 3.9 3.8 3.9  CL  --    < > 99 110 104 105 105 104  CO2  --    < > 21* 21* 21* 25 24 21*  GLUCOSE  --    < > 148* 189* 99 115* 106* 110*  BUN  --    < > 11 9 10 12 11 16   CREATININE  --    < > 0.93 0.79 0.75 0.78 0.69 0.78  CALCIUM  --    < > 7.7* 7.7* 7.9* 8.0* 8.2* 8.4*  MG 1.5*  --  1.4* 2.6* 2.0  --  1.9  --   PHOS  --   --  2.0* 4.3 2.5  --  3.6  --    < > = values in this interval not displayed.   GFR: Estimated Creatinine Clearance: 90.2 mL/min (by C-G formula based on SCr of 0.78 mg/dL). Liver Function Tests:  Recent Labs  Lab 01/13/23 2129  AST 22  ALT 18  ALKPHOS 87  BILITOT 1.4*  PROT 6.3*  ALBUMIN 2.4*   No results for input(s): "LIPASE", "AMYLASE" in the last 168 hours. No results for input(s): "AMMONIA" in the last 168 hours. Coagulation Profile: Recent Labs  Lab 01/13/23 2129 01/18/23 0506  INR 1.3* 1.1   Cardiac Enzymes: Recent Labs  Lab 01/13/23 2128  CKTOTAL 33*   BNP (last 3 results) No results for input(s): "PROBNP" in the last 8760 hours. HbA1C: No results for input(s): "HGBA1C" in the  last 72 hours. CBG: No results for input(s): "GLUCAP" in the last 168 hours. Lipid Profile: No results for input(s): "CHOL", "HDL", "LDLCALC", "TRIG", "CHOLHDL", "LDLDIRECT" in the last 72 hours. Thyroid Function Tests: No results for input(s): "TSH", "T4TOTAL", "FREET4", "T3FREE", "THYROIDAB" in the last 72 hours. Anemia Panel: No results for input(s): "VITAMINB12", "FOLATE", "FERRITIN", "TIBC", "IRON", "RETICCTPCT" in the last 72 hours. Most Recent Urinalysis On File:     Component Value Date/Time   COLORURINE YELLOW (A) 01/13/2023 2130   APPEARANCEUR CLEAR (A) 01/13/2023 2130   LABSPEC 1.010 01/13/2023 2130   PHURINE 6.0 01/13/2023 2130   GLUCOSEU NEGATIVE 01/13/2023 2130   HGBUR SMALL (A) 01/13/2023 2130   BILIRUBINUR NEGATIVE 01/13/2023 2130   KETONESUR NEGATIVE 01/13/2023 2130   PROTEINUR NEGATIVE 01/13/2023 2130   NITRITE NEGATIVE 01/13/2023 2130   LEUKOCYTESUR NEGATIVE 01/13/2023 2130   Sepsis Labs: @LABRCNTIP (procalcitonin:4,lacticidven:4) Microbiology: Recent Results (from the past 240 hours)  Resp panel by RT-PCR (RSV, Flu A&B, Covid) Anterior Nasal Swab     Status: None   Collection Time: 01/13/23  9:29 PM   Specimen: Anterior Nasal Swab  Result Value Ref Range Status   SARS Coronavirus 2 by RT PCR NEGATIVE NEGATIVE Final    Comment: (NOTE) SARS-CoV-2 target nucleic acids are NOT DETECTED.  The SARS-CoV-2 RNA is generally detectable in upper respiratory specimens during the acute phase of infection. The lowest concentration of SARS-CoV-2 viral copies this assay can detect is 138 copies/mL. A negative result does not preclude SARS-Cov-2 infection and should not be used as the sole basis for treatment or other patient management decisions. A negative result may occur with  improper specimen collection/handling, submission of specimen other than nasopharyngeal swab, presence of viral mutation(s) within the areas targeted by this assay, and inadequate number of  viral copies(<138 copies/mL). A negative result must be combined with clinical observations, patient history, and epidemiological information. The expected result is Negative.  Fact Sheet for Patients:  BloggerCourse.com  Fact Sheet for Healthcare Providers:  SeriousBroker.it  This test is no t yet approved or cleared by the [place] FDA and  has been authorized for detection and/or diagnosis of SARS-CoV-2 by FDA under an Emergency Use Authorization (EUA). This EUA will remain  in effect (meaning this test can be used) for the duration of the COVID-19 declaration under Section 564(b)(1) of the Act, 21 U.S.C.section 360bbb-3(b)(1), unless the authorization is terminated  or revoked sooner.       Influenza A by PCR NEGATIVE NEGATIVE Final   Influenza B by PCR NEGATIVE NEGATIVE Final    Comment: (NOTE) The Xpert Xpress SARS-CoV-2/FLU/RSV plus assay is intended as an aid in the diagnosis of influenza from Nasopharyngeal swab specimens and should not be used as a sole basis for treatment. Nasal washings and aspirates are unacceptable for Xpert Xpress SARS-CoV-2/FLU/RSV testing.  Fact Sheet for Patients: BloggerCourse.com  Fact Sheet for Healthcare Providers: SeriousBroker.it  This  test is not yet approved or cleared by the [place] and has been authorized for detection and/or diagnosis of SARS-CoV-2 by FDA under an Emergency Use Authorization (EUA). This EUA will remain in effect (meaning this test can be used) for the duration of the COVID-19 declaration under Section 564(b)(1) of the Act, 21 U.S.C. section 360bbb-3(b)(1), unless the authorization is terminated or revoked.     Resp Syncytial Virus by PCR NEGATIVE NEGATIVE Final    Comment: (NOTE) Fact Sheet for Patients: BloggerCourse.com  Fact Sheet for Healthcare  Providers: SeriousBroker.it  This test is not yet approved or cleared by the [place] FDA and has been authorized for detection and/or diagnosis of SARS-CoV-2 by FDA under an Emergency Use Authorization (EUA). This EUA will remain in effect (meaning this test can be used) for the duration of the COVID-19 declaration under Section 564(b)(1) of the Act, 21 U.S.C. section 360bbb-3(b)(1), unless the authorization is terminated or revoked.  Performed at [facility], [address] [place], [place] [address]   Blood Culture (routine x 2)     Status: Abnormal   Collection Time: 01/13/23  9:29 PM   Specimen: BLOOD  Result Value Ref Range Status   Specimen Description   Final    BLOOD BLOOD LEFT ARM Performed at [facility], [address], [place], [place] [address]    Special Requests   Final    BOTTLES DRAWN AEROBIC AND ANAEROBIC Blood Culture adequate volume Performed at [facility], [address] [place], [place] [address]    Culture  Setup Time   Final    Organism ID to follow ANAEROBIC BOTTLE ONLY GRAM NEGATIVE RODS CRITICAL RESULT CALLED TO, READ BACK BY AND VERIFIED WITH: TREY GREENWOOD 01/14/23 1142 KLW Performed at [facility], [address] [place], [place] [address]    Culture ESCHERICHIA COLI (A)  Final   Report Status 01/16/2023 FINAL  Final   Organism ID, Bacteria ESCHERICHIA COLI  Final   Organism ID, Bacteria ESCHERICHIA COLI  Final      Susceptibility   Escherichia coli - KIRBY BAUER*    CEFAZOLIN INTERMEDIATE Intermediate    Escherichia coli - MIC*    AMPICILLIN >=32 RESISTANT Resistant     CEFEPIME <=0.12 SENSITIVE Sensitive     CEFTAZIDIME <=1 SENSITIVE Sensitive     CEFTRIAXONE <=0.25 SENSITIVE Sensitive     CIPROFLOXACIN <=0.25 SENSITIVE Sensitive     GENTAMICIN <=1 SENSITIVE Sensitive     IMIPENEM <=0.25 SENSITIVE Sensitive     TRIMETH/SULFA <=20 SENSITIVE Sensitive      AMPICILLIN/SULBACTAM 16 INTERMEDIATE Intermediate     PIP/TAZO <=4 SENSITIVE Sensitive ug/mL    * ESCHERICHIA COLI    ESCHERICHIA COLI  Urine Culture     Status: None   Collection Time: 01/13/23  9:29 PM   Specimen: Urine, Clean Catch  Result Value Ref Range Status   Specimen Description   Final    URINE, CLEAN CATCH Performed at [facility], [address], [place], [place] [address]    Special Requests   Final    NONE Performed at [facility], [address], [place], [place] [address]    Culture   Final    NO GROWTH Performed at [facility], [address] [place], [place] [address]    Report Status 01/15/2023 FINAL  Final  Blood Culture ID Panel (Reflexed)     Status: Abnormal   Collection Time: 01/13/23  9:29  PM  Result Value Ref Range Status   Enterococcus faecalis NOT DETECTED NOT DETECTED Final   Enterococcus Faecium NOT DETECTED NOT DETECTED Final   Listeria monocytogenes NOT DETECTED NOT DETECTED Final   Staphylococcus species NOT DETECTED NOT DETECTED Final   Staphylococcus aureus (BCID) NOT DETECTED NOT DETECTED Final   Staphylococcus epidermidis NOT DETECTED NOT DETECTED Final   Staphylococcus lugdunensis NOT DETECTED NOT DETECTED Final   Streptococcus species NOT DETECTED NOT DETECTED Final   Streptococcus agalactiae NOT DETECTED NOT DETECTED Final   Streptococcus pneumoniae NOT DETECTED NOT DETECTED Final   Streptococcus pyogenes NOT DETECTED NOT DETECTED Final   A.calcoaceticus-baumannii NOT DETECTED NOT DETECTED Final   Bacteroides fragilis NOT DETECTED NOT DETECTED Final   Enterobacterales DETECTED (A) NOT DETECTED Final    Comment: Enterobacterales represent a large order of gram negative bacteria, not a single organism. CRITICAL RESULT CALLED TO, READ BACK BY AND VERIFIED WITH: TREY GREENWOOD 01/14/23 1142 KLW    Enterobacter cloacae complex NOT DETECTED NOT DETECTED Final   Escherichia coli DETECTED (A) NOT DETECTED Final     Comment: CRITICAL RESULT CALLED TO, READ BACK BY AND VERIFIED WITH: TREY GREENWOOD 01/14/23 1142 KLW    Klebsiella aerogenes NOT DETECTED NOT DETECTED Final   Klebsiella oxytoca NOT DETECTED NOT DETECTED Final   Klebsiella pneumoniae NOT DETECTED NOT DETECTED Final   Proteus species NOT DETECTED NOT DETECTED Final   Salmonella species NOT DETECTED NOT DETECTED Final   Serratia marcescens NOT DETECTED NOT DETECTED Final   Haemophilus influenzae NOT DETECTED NOT DETECTED Final   Neisseria meningitidis NOT DETECTED NOT DETECTED Final   Pseudomonas aeruginosa NOT DETECTED NOT DETECTED Final   Stenotrophomonas maltophilia NOT DETECTED NOT DETECTED Final   Candida albicans NOT DETECTED NOT DETECTED Final   Candida auris NOT DETECTED NOT DETECTED Final   Candida glabrata NOT DETECTED NOT DETECTED Final   Candida krusei NOT DETECTED NOT DETECTED Final   Candida parapsilosis NOT DETECTED NOT DETECTED Final   Candida tropicalis NOT DETECTED NOT DETECTED Final   Cryptococcus neoformans/gattii NOT DETECTED NOT DETECTED Final   CTX-M ESBL NOT DETECTED NOT DETECTED Final   Carbapenem resistance IMP NOT DETECTED NOT DETECTED Final   Carbapenem resistance KPC NOT DETECTED NOT DETECTED Final   Carbapenem resistance NDM NOT DETECTED NOT DETECTED Final   Carbapenem resist OXA 48 LIKE NOT DETECTED NOT DETECTED Final   Carbapenem resistance VIM NOT DETECTED NOT DETECTED Final    Comment: Performed at [facility], [address] [place], [place] [address]  Blood Culture (routine x 2)     Status: None   Collection Time: 01/13/23 11:06 PM   Specimen: BLOOD  Result Value Ref Range Status   Specimen Description BLOOD RIGHT ARM  Final   Special Requests   Final    BOTTLES DRAWN AEROBIC AND ANAEROBIC Blood Culture adequate volume   Culture   Final    NO GROWTH 5 DAYS Performed at [facility] [address] [place], [place] [address]    Report Status 01/19/2023 FINAL  Final   Respiratory (~20 pathogens) panel by PCR     Status: None   Collection Time: 01/14/23 12:15 PM   Specimen: Nasopharyngeal Swab; Respiratory  Result Value Ref Range Status   Adenovirus NOT DETECTED NOT DETECTED Final   Coronavirus 229E NOT DETECTED NOT DETECTED Final    Comment: (NOTE) The Coronavirus on the Respiratory Panel, DOES NOT test for the novel  Coronavirus (2019 nCoV)  Coronavirus HKU1 NOT DETECTED NOT DETECTED Final   Coronavirus NL63 NOT DETECTED NOT DETECTED Final   Coronavirus OC43 NOT DETECTED NOT DETECTED Final   Metapneumovirus NOT DETECTED NOT DETECTED Final   Rhinovirus / Enterovirus NOT DETECTED NOT DETECTED Final   Influenza A NOT DETECTED NOT DETECTED Final   Influenza B NOT DETECTED NOT DETECTED Final   Parainfluenza Virus 1 NOT DETECTED NOT DETECTED Final   Parainfluenza Virus 2 NOT DETECTED NOT DETECTED Final   Parainfluenza Virus 3 NOT DETECTED NOT DETECTED Final   Parainfluenza Virus 4 NOT DETECTED NOT DETECTED Final   Respiratory Syncytial Virus NOT DETECTED NOT DETECTED Final   Bordetella pertussis NOT DETECTED NOT DETECTED Final   Bordetella Parapertussis NOT DETECTED NOT DETECTED Final   Chlamydophila pneumoniae NOT DETECTED NOT DETECTED Final   Mycoplasma pneumoniae NOT DETECTED NOT DETECTED Final    Comment: Performed at [facility], [address], [place], [place] [address]  Culture, blood (Routine X 2) w Reflex to ID Panel     Status: None (Preliminary result)   Collection Time: 01/15/23  9:14 AM   Specimen: Left Antecubital; Blood  Result Value Ref Range Status   Specimen Description LEFT ANTECUBITAL  Final   Special Requests   Final    BLOOD BOTTLES DRAWN AEROBIC AND ANAEROBIC Blood Culture adequate volume   Culture   Final    NO GROWTH 4 DAYS Performed at [facility], [address], [place], [place] [address]    Report Status PENDING  Incomplete  Culture, blood (Routine X 2) w Reflex to ID Panel     Status: None  (Preliminary result)   Collection Time: 01/15/23  9:15 AM   Specimen: BLOOD RIGHT HAND  Result Value Ref Range Status   Specimen Description BLOOD RIGHT HAND  Final   Special Requests   Final    BLOOD BOTTLES DRAWN AEROBIC AND ANAEROBIC Blood Culture adequate volume   Culture   Final    NO GROWTH 4 DAYS Performed at [facility], [address], [place], [place] [address]    Report Status PENDING  Incomplete  Aerobic/Anaerobic Culture w Gram Stain (surgical/deep wound)     Status: None (Preliminary result)   Collection Time: 01/18/23 11:34 AM   Specimen: Drain; Body Fluid  Result Value Ref Range Status   Specimen Description   Final    JP DRAINAGE Performed at [facility], [address] [place], [place] [address]    Special Requests   Final    NONE Performed at [facility], [address] [place], [place] [address]    Gram Stain   Final    ABUNDANT WBC PRESENT, PREDOMINANTLY PMN NO ORGANISMS SEEN    Culture   Final    NO GROWTH < 24 HOURS Performed at [facility], [address] [place], [place] [address]    Report Status PENDING  Incomplete      Radiology Studies last 3 days: IR IMAGE GUIDED FLUID DRAIN BY CATHETER Result Date: 01/18/2023 INDICATION: 72 year old gentleman with bacteremia and multiloculated left hip and thigh abscesses presents to interventional radiology for imaging guided drain placement. EXAM: Ultrasound and fluoroscopy guided left lateral hip abscess drain placement MEDICATIONS: The patient is currently admitted to the hospital and receiving intravenous antibiotics. The antibiotics were administered within an appropriate time frame prior to the initiation of the procedure. ANESTHESIA/SEDATION: None COMPLICATIONS: None immediate. PROCEDURE: Informed written consent was obtained from the patient after  a thorough discussion of the procedural risks, benefits and alternatives. All questions were addressed. Maximal  Sterile Barrier Technique was utilized including caps, mask, sterile gowns, sterile gloves, sterile drape, hand hygiene and skin antiseptic. A timeout was performed prior to the initiation of the procedure. Ultrasound examination of the left lateral hip region confirmed presence of large fluid collection. The overlying skin was prepped and draped in the usual sterile fashion. Following local administration, the fluid collection was accessed with 19 gauge Yueh needle using continuous ultrasound guidance. The Yueh catheter was removed over 0.035 inch Amplatz guidewire replaced with 10.2 [place] multipurpose pigtail drain. Limited Dyna CT of the region was performed and confirmed appropriate positioning within the collection. 60 mL of purulent material was aspirated and sent for Gram stain and culture. There did not appear to be significant decrease in size of the left anterior hip fluid collection with aspiration of the lateral hip collection. Therefore decision was made to attempt placement of a drain within this collection. The overlying skin was prepped and draped in usual fashion. Following local administration, this collection was accessed with a 19 gauge Yueh needle utilizing continuous ultrasound guidance. The Yueh catheter was removed over 0.035 inch guidewire. However, I was not able to advance a 10 [place] drain into the collection utilizing fluoroscopic guidance. Hemorrhage was seen along the catheter tract therefore the procedure was abandoned and 15 minutes of manual compression was applied to the site to confirm hemostasis. IMPRESSION: 1. Successful insertion of 10.2 French abscess drain in the left lateral hip collection. 60 mL of purulent material aspirated and sent for Gram stain and culture. 2. Unsuccessful insertion of drain in anterior hip fluid collection. Collection was accessed with 19 gauge Yueh needle, however a drain could not be advanced into the collection. Electronically Signed   By: Acquanetta Belling M.D.   On: 01/18/2023 20:42   MR Lumbar Spine W Wo Contrast Result Date: 01/17/2023 CLINICAL DATA:  Osteomyelitis, lumbar.  Bacteremia. EXAM: MRI LUMBAR SPINE WITHOUT AND WITH CONTRAST TECHNIQUE: Multiplanar and multiecho pulse sequences of the lumbar spine were obtained without and with intravenous contrast. CONTRAST:  8mL GADAVIST GADOBUTROL 1 MMOL/ML IV SOLN COMPARISON:  Abdominal CT from yesterday FINDINGS: Segmentation:  5 lumbar type vertebrae Alignment:  Mild levoscoliosis. Vertebrae: T12 compression fracture with marrow edema. Height loss is moderate at approximate 50%. Remote and healed L3 superior endplate fracture with mild height loss. No marrow edema to suggest discitis or osteomyelitis. Hemangioma in the L3 body. Conus medullaris and cauda equina: Conus extends to the T12-L1 level. Conus and cauda equina appear normal. Paraspinal and other soft tissues: No perispinal mass or inflammation. Partially covered collection posterior to the left hip. No change from CT yesterday. Disc levels: T12- L1: Unremarkable. L1-L2: Disc height loss and desiccation with bulge. No neural compression L2-L3: Degenerative facet spurring. Circumferential disc bulging. No neural compression L3-L4: Disc bulging with endplate and facet spurring. Mild right subarticular recess narrowing L4-L5: Disc narrowing with bulging and endplate/facet spurring eccentric to the left. L5-S1:Degenerative facet spurring. Left foraminal annular fissure. No neural compression. IMPRESSION: 1. Acute or subacute T12 compression fracture with moderate height loss. Remote, healed L3 compression fracture. 2. No evidence of discitis or osteomyelitis. 3. Generalized lumbar spine degeneration with scoliosis. Affected levels are described above. Electronically Signed   By: Tiburcio Pea M.D.   On: 01/17/2023 10:19   CT ABDOMEN PELVIS W CONTRAST Result Date: 01/16/2023 CLINICAL DATA:  Bacteremia. EXAM: CT ABDOMEN AND PELVIS WITH  CONTRAST  TECHNIQUE: Multidetector CT imaging of the abdomen and pelvis was performed using the standard protocol following bolus administration of intravenous contrast. RADIATION DOSE REDUCTION: This exam was performed according to the departmental dose-optimization program which includes automated exposure control, adjustment of the mA and/or kV according to patient size and/or use of iterative reconstruction technique. CONTRAST:  OMNIPAQUE IOHEXOL 300 MG/ML  SOLN COMPARISON:  None Available. FINDINGS: Lower chest: Minimal bilateral pleural effusions are noted with minimal adjacent subsegmental atelectasis. Hepatobiliary: No focal liver abnormality is seen. No gallstones, gallbladder wall thickening, or biliary dilatation. Pancreas: Unremarkable. No pancreatic ductal dilatation or surrounding inflammatory changes. Spleen: Normal in size without focal abnormality. Adrenals/Urinary Tract: Adrenal glands appear normal. Right renal cyst is noted for which no further follow-up is required. No hydronephrosis or renal obstruction is noted. Mild urinary bladder distention is noted. Stomach/Bowel: Moderate gastric distention is noted. There is no evidence of bowel obstruction or inflammation. The appendix is unremarkable. Vascular/Lymphatic: No significant vascular findings are present. No enlarged abdominal or pelvic lymph nodes. Reproductive: Prostate is unremarkable. Other: No ascites or hernia is noted. Musculoskeletal: Severe compression deformity of T12 vertebral body is noted most consistent with old fracture. 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 Moderately depressed superior endplate fracture of L3 is noted which may be acute. Gas is noted in the L3-4 disc space and possible discitis and associated osteomyelitis cannot be excluded. Status post left total hip arthroplasty. Very large left hip joint effusion is noted and septic arthritis cannot be excluded. Status post surgical internal fixation of old  proximal right femoral fracture. IMPRESSION: Moderately depressed superior endplate fracture of L3 is noted which may be acute. Gas is also noted in the L3-4 disc space and therefore possible discitis and associated osteomyelitis cannot be excluded at this level. Further evaluation with MRI of the lumbar spine with and without gadolinium is recommended. Also noted is very large left hip joint effusion surrounding left total hip arthroplasty. Septic arthritis cannot be excluded. Moderate gastric distention. Minimal bilateral pleural effusions are noted with adjacent subsegmental atelectasis. These results will be called to the ordering clinician or representative by the Radiologist Assistant, and communication documented in the PACS or zVision Dashboard. Electronically Signed   By: Lupita Raider M.D.   On: 01/16/2023 15:52       Sunnie Nielsen, DO [facility] 01/19/2023, 6:48 PM    Dictation software may have been used to generate the above note. Typos may occur and escape review in typed/dictated notes. Please contact Dr Lyn Hollingshead directly for clarity if needed.  Staff may message me via secure chat in Epic  but this may not receive an immediate response,  please page me for urgent matters!  If 7PM-7AM, please contact night coverage [email or web address]

## 2023-01-19 NOTE — Interval H&P Note (Signed)
Patient history and physical updated. Consent reviewed including risks, benefits, and alternatives to surgery. Patient and sister agrees with above plan to proceed with left hip irrigation and debridement with revision head and liner exchange, antibiotic bead placement and drain.

## 2023-01-20 DIAGNOSIS — T8450XA Infection and inflammatory reaction due to unspecified internal joint prosthesis, initial encounter: Secondary | ICD-10-CM

## 2023-01-20 DIAGNOSIS — R6510 Systemic inflammatory response syndrome (SIRS) of non-infectious origin without acute organ dysfunction: Secondary | ICD-10-CM | POA: Diagnosis not present

## 2023-01-20 DIAGNOSIS — B9620 Unspecified Escherichia coli [E. coli] as the cause of diseases classified elsewhere: Secondary | ICD-10-CM | POA: Diagnosis not present

## 2023-01-20 DIAGNOSIS — T8454XA Infection and inflammatory reaction due to internal left knee prosthesis, initial encounter: Secondary | ICD-10-CM | POA: Diagnosis not present

## 2023-01-20 DIAGNOSIS — R7881 Bacteremia: Secondary | ICD-10-CM | POA: Diagnosis not present

## 2023-01-20 LAB — CBC
HCT: 31.7 % — ABNORMAL LOW (ref 39.0–52.0)
Hemoglobin: 10.3 g/dL — ABNORMAL LOW (ref 13.0–17.0)
MCH: 24.6 pg — ABNORMAL LOW (ref 26.0–34.0)
MCHC: 32.5 g/dL (ref 30.0–36.0)
MCV: 75.8 fL — ABNORMAL LOW (ref 80.0–100.0)
Platelets: 406 10*3/uL — ABNORMAL HIGH (ref 150–400)
RBC: 4.18 MIL/uL — ABNORMAL LOW (ref 4.22–5.81)
RDW: 19.5 % — ABNORMAL HIGH (ref 11.5–15.5)
WBC: 14 10*3/uL — ABNORMAL HIGH (ref 4.0–10.5)
nRBC: 0 % (ref 0.0–0.2)

## 2023-01-20 LAB — CULTURE, BLOOD (ROUTINE X 2)
Culture: NO GROWTH
Culture: NO GROWTH

## 2023-01-20 LAB — BASIC METABOLIC PANEL
Anion gap: 9 (ref 5–15)
BUN: 20 mg/dL (ref 8–23)
CO2: 22 mmol/L (ref 22–32)
Calcium: 8.6 mg/dL — ABNORMAL LOW (ref 8.9–10.3)
Chloride: 101 mmol/L (ref 98–111)
Creatinine, Ser: 0.96 mg/dL (ref 0.61–1.24)
GFR, Estimated: >60 mL/min (ref >60)
Glucose, Bld: 133 mg/dL — ABNORMAL HIGH (ref 70–99)
Potassium: 4.6 mmol/L (ref 3.5–5.1)
Sodium: 132 mmol/L — ABNORMAL LOW (ref 135–145)

## 2023-01-20 MED ORDER — ENOXAPARIN SODIUM 40 MG/0.4ML IJ SOSY
40.0000 mg | PREFILLED_SYRINGE | INTRAMUSCULAR | Status: DC
  Administered 2023-01-20 – 2023-01-29 (×10): 40 mg via SUBCUTANEOUS
  Filled 2023-01-20 (×10): qty 0.4

## 2023-01-20 NOTE — Progress Notes (Signed)
Physical Therapy Treatment Patient Details Name: Adrian Gillespie MRN: [id number] DOB: [birth year] Today's Date: 01/20/2023   History of Present Illness Pt is a 72 y.o. male with PMHx: HTN, HLD, COPD, asthma, GERD, depression, anxiety, schizophrenia, HBV, tobacco abuse, who presents with AMS, fever and fall.  MD assessment includes: encephalopathy, sepsis secondary to ecoli, frequent falls, hyponatremia, and left hip septic arthritis/prosthetic joint infection now s/p left hip and thigh irrigation and debridement with revision head and liner exchange with placement of custom made antibiotic beads on 01/19/23.    PT Comments  Pt in chair, ready to get up and move back to bed.  Stood and transferred with CGA/min a x 1 and overall moves well stepping to bed.  Mod a x 1 for LE's.  Safety precautions and needs in reach.    If plan is discharge home, recommend the following: A little help with walking and/or transfers;A little help with bathing/dressing/bathroom;Assistance with cooking/housework;Supervision due to cognitive status;Assist for transportation;Direct supervision/assist for medications management;Help with stairs or ramp for entrance   Can travel by private vehicle     No  Equipment Recommendations  Other (comment) (TBD at next venue of care)    Recommendations for Other Services       Precautions / Restrictions Precautions Precautions: Fall;Posterior Hip Precaution Booklet Issued: Yes (comment) Restrictions Weight Bearing Restrictions Per Provider Order: Yes LLE Weight Bearing Per Provider Order: Weight bearing as tolerated Other Position/Activity Restrictions: Wound VAC, JP drain, and hemovac     Mobility  Bed Mobility Overal bed mobility: Needs Assistance Bed Mobility: Sit to Supine       Sit to supine: Mod assist        Transfers Overall transfer level: Needs assistance Equipment used: Rolling walker (2 wheels) Transfers: Sit to/from Stand Sit to Stand: Min  assist, Contact guard assist                Ambulation/Gait Ambulation/Gait assistance: Contact guard assist Gait Distance (Feet): 4 Feet Assistive device: Rolling walker (2 wheels) Gait Pattern/deviations: Step-to pattern, Decreased step length - right, Decreased stance time - left Gait velocity: decreased         Stairs             Wheelchair Mobility     Tilt Bed    Modified Rankin (Stroke Patients Only)       Balance Overall balance assessment: Needs assistance Sitting-balance support: Feet supported, Bilateral upper extremity supported Sitting balance-Leahy Scale: Fair     Standing balance support: Reliant on assistive device for balance, Bilateral upper extremity supported, During functional activity Standing balance-Leahy Scale: Fair                              Cognition Arousal: Alert Behavior During Therapy: WFL for tasks assessed/performed Overall Cognitive Status: Within Functional Limits for tasks assessed                                          Exercises      General Comments        Pertinent Vitals/Pain Pain Assessment Pain Assessment: Faces Faces Pain Scale: Hurts even more Pain Location: generally uncomfortable from sitting in chair Pain Descriptors / Indicators: Aching, Pressure Pain Intervention(s): Limited activity within patient's tolerance, Monitored during session, Repositioned    Home Living Family/patient expects to be  discharged to:: Skilled nursing facility                   Additional Comments: Per chart review pt is a resident at an ALF    Prior Function            PT Goals (current goals can now be found in the care plan section) Acute Rehab PT Goals Patient Stated Goal: To feel better PT Goal Formulation: With patient Time For Goal Achievement: 02/02/23 Potential to Achieve Goals: Fair Progress towards PT goals: Progressing toward goals    Frequency     BID      PT Plan      Co-evaluation              AM-PAC PT "6 Clicks" Mobility   Outcome Measure  Help needed turning from your back to your side while in a flat bed without using bedrails?: A Little Help needed moving from lying on your back to sitting on the side of a flat bed without using bedrails?: A Lot Help needed moving to and from a bed to a chair (including a wheelchair)?: A Little Help needed standing up from a chair using your arms (e.g., wheelchair or bedside chair)?: A Little Help needed to walk in hospital room?: A Lot Help needed climbing 3-5 steps with a railing? : Total 6 Click Score: 14    End of Session Equipment Utilized During Treatment: Gait belt Activity Tolerance: Patient tolerated treatment well Patient left: in chair;with call bell/phone within reach;with chair alarm set;Other (comment) (Two pillows set up under BLEs to prevent hip adduction) Nurse Communication: Mobility status;Precautions PT Visit Diagnosis: Muscle weakness (generalized) (M62.81);Other abnormalities of gait and mobility (R26.89)     Time: 6045-4098 PT Time Calculation (min) (ACUTE ONLY): 12 min  Charges:    $Therapeutic Activity: 8-22 mins PT General Charges $$ ACUTE PT VISIT: 1 Visit                    Danielle Dess, PTA 01/20/23, 1:29 PM

## 2023-01-20 NOTE — Progress Notes (Signed)
Date of Admission:  01/13/2023    ID: Adrian Gillespie is a 72 y.o. male  Principal Problem:   SIRS (systemic inflammatory response syndrome) (HCC) Active Problems:   Hypokalemia   Schizophrenia (HCC)   Chronic obstructive pulmonary disease (HCC)   HTN (hypertension)   AF (paroxysmal atrial fibrillation) (HCC)   Fall at home, initial encounter   Hyponatremia   Prolonged QT interval   Electrolyte disturbance   Hypophosphatemia   Acute metabolic encephalopathy   Left shoulder pain   Hypomagnesemia   Hypotension   E coli bacteremia   Sepsis without acute organ dysfunction (HCC)   Abscess of hip, left   Prosthetic joint infection (HCC)    Subjective: Pt doing okay  Medications:   benztropine  1 mg Oral Daily   carvedilol  3.125 mg Oral BID WC   docusate sodium  100 mg Oral BID   enoxaparin (LOVENOX) injection  40 mg Subcutaneous Q24H   feeding supplement  237 mL Oral BID BM   haloperidol  10 mg Oral BID   midodrine  10 mg Oral TID WC   mometasone-formoterol  2 puff Inhalation BID   montelukast  10 mg Oral Daily   multivitamin with minerals  1 tablet Oral Daily   nicotine  21 mg Transdermal Daily   pantoprazole  40 mg Oral Daily   sodium chloride flush  5 mL Intracatheter Q8H   tamsulosin  0.4 mg Oral QPC supper   traZODone  300 mg Oral QHS    Objective: Vital signs in last 24 hours: Patient Vitals for the past 24 hrs:  BP Temp Temp src Pulse Resp SpO2  01/20/23 1552 93/71 97.8 F (36.6 C) Oral 75 17 94 %  01/20/23 1000 102/78 97.8 F (36.6 C) Oral 77 -- 98 %  01/20/23 0900 -- -- -- -- -- 97 %  01/20/23 0229 93/65 97.8 F (36.6 C) -- 65 18 98 %  01/20/23 0023 (!) 85/60 97.8 F (36.6 C) -- 67 16 99 %  01/19/23 2018 110/75 (!) 97.5 F (36.4 C) Axillary 88 19 99 %      PHYSICAL EXAM:  General:awake and alert Lungs: Clear to auscultation bilaterally. No Wheezing or Rhonchi. No rales. Heart: Regular rate and rhythm, no murmur, rub or gallop. Abdomen: Soft,  non-tender,not distended. Bowel sounds normal. No masses Extremities:left hip surgical dressing Drain Neurologic: Grossly non-focal  Lab Results    Latest Ref Rng & Units 01/20/2023    4:08 AM 01/19/2023    5:45 AM 01/14/2023    6:57 AM  CBC  WBC 4.0 - 10.5 K/uL 14.0  11.5  12.9   Hemoglobin 13.0 - 17.0 g/dL 28.4  13.2  44.0   Hematocrit 39.0 - 52.0 % 31.7  30.9  31.7   Platelets 150 - 400 K/uL 406  376  263        Latest Ref Rng & Units 01/20/2023    4:08 AM 01/19/2023    5:45 AM 01/17/2023    5:14 AM  CMP  Glucose 70 - 99 mg/dL 102  725  366   BUN 8 - 23 mg/dL 20  16  11    Creatinine 0.61 - 1.24 mg/dL 4.40  3.47  4.25   Sodium 135 - 145 mmol/L 132  135  136   Potassium 3.5 - 5.1 mmol/L 4.6  3.9  3.8   Chloride 98 - 111 mmol/L 101  104  105   CO2 22 - 32 mmol/L 22  21  24   Calcium 8.9 - 10.3 mg/dL 8.6  8.4  8.2       Microbiology: Ecoli blood culture  Studies/Results: DG Pelvis Portable Result Date: 01/19/2023 CLINICAL DATA:  Postop arthroplasty. EXAM: PORTABLE PELVIS 1-2 VIEWS COMPARISON:  CT abdomen pelvis dated 01/16/2023. FINDINGS: No acute fracture or dislocation. The bones are osteopenic. Prior ORIF of the right femur and status post total left hip arthroplasty. A drainage catheter and beads noted over the left hip. A Foley catheter is present. Left hip cutaneous clips. IMPRESSION: Status post total left hip arthroplasty. Electronically Signed   By: Elgie Collard M.D.   On: 01/19/2023 19:18     Assessment/Plan: Encephalopathy- metabolic resolved   Sepsis secondary to ecoli Has ecoli bacteremia  Left knee  PJI- S/P Debridement and head and liner replacement  continue ceftriaxone- will need up to 12 weeks of antibiotic-   Has urinary incontinence Bladder scan 350cc  CT scan questioned discitis in the lumbar spine MRI no evidence of discitis    Fall- pain left shoulder- xray N   Anemia   Hyponatremia     COPD   Schizophrenia  Discussed  the management with Patient  ID will follow him peripherally

## 2023-01-20 NOTE — TOC PASRR Note (Signed)
RE: Adrian Gillespie Date of Birth: 05/31/1960 Date: 01/20/2023     To Whom It May Concern:   Please be advised that the above-named patient will require a short-term nursing home stay - anticipated 30 days or less for rehabilitation and strengthening.  The plan is for return home

## 2023-01-20 NOTE — Evaluation (Signed)
Physical Therapy Evaluation Patient Details Name: Adrian Gillespie MRN: [id number] DOB: 08-13-1950 Today's Date: 01/20/2023  History of Present Illness  Pt is a 72 y.o. male with PMHx: HTN, HLD, COPD, asthma, GERD, depression, anxiety, schizophrenia, HBV, tobacco abuse, who presents with AMS, fever and fall.  MD assessment includes: encephalopathy, sepsis secondary to ecoli, frequent falls, hyponatremia, and left hip septic arthritis/prosthetic joint infection now s/p left hip and thigh irrigation and debridement with revision head and liner exchange with placement of custom made antibiotic beads on 01/19/23.   Clinical Impression  Pt was pleasant and motivated to participate during the session and put forth good effort throughout. Despite good effort pt required physical assistance with bed mobility tasks and to come to full upright standing from an elevated EOB.  Pt was able to perform 5 standing marches on each LE prior to attempting ambulation with no buckling or LOB.  Pt then able to take several steps at the EOB and to the chair but was unable to ambulate further secondary to weakness.  Pt reported 0/10 pain during the session with SpO2 and HR WNL on room air.  Pt will benefit from continued PT services upon discharge to safely address deficits listed in patient problem list for decreased caregiver assistance and eventual return to PLOF.          If plan is discharge home, recommend the following: A little help with walking and/or transfers;A little help with bathing/dressing/bathroom;Assistance with cooking/housework;Supervision due to cognitive status;Assist for transportation;Direct supervision/assist for medications management;Help with stairs or ramp for entrance   Can travel by private vehicle   No    Equipment Recommendations Other (comment) (TBD at next venue of care)  Recommendations for Other Services       Functional Status Assessment Patient has had a recent decline in their  functional status and demonstrates the ability to make significant improvements in function in a reasonable and predictable amount of time.     Precautions / Restrictions Precautions Precautions: Fall;Posterior Hip Precaution Booklet Issued: Yes (comment) Restrictions Weight Bearing Restrictions Per Provider Order: Yes LLE Weight Bearing Per Provider Order: Weight bearing as tolerated      Mobility  Bed Mobility Overal bed mobility: Needs Assistance Bed Mobility: Supine to Sit     Supine to sit: Mod assist     General bed mobility comments: Mod A for LLE and trunk control    Transfers Overall transfer level: Needs assistance Equipment used: Rolling walker (2 wheels) Transfers: Sit to/from Stand Sit to Stand: Min assist, From elevated surface           General transfer comment: Min A and mod multi-modal cues for sequencing to stand from an elevated EOB with hip precaution compliance    Ambulation/Gait Ambulation/Gait assistance: Contact guard assist Gait Distance (Feet): 4 Feet Assistive device: Rolling walker (2 wheels) Gait Pattern/deviations: Step-to pattern, Decreased step length - right, Decreased stance time - left Gait velocity: decreased     General Gait Details: Pt able to take a max of 4-5 very small, shuffling steps near the EOB and from bed to chair with min verbal and visual cues for sequencing for hip precaution compliance during L turn  Stairs            Wheelchair Mobility     Tilt Bed    Modified Rankin (Stroke Patients Only)       Balance Overall balance assessment: Needs assistance Sitting-balance support: Feet supported, Bilateral upper extremity supported Sitting balance-Leahy Scale:  Fair     Standing balance support: Reliant on assistive device for balance, Bilateral upper extremity supported, During functional activity Standing balance-Leahy Scale: Fair                               Pertinent Vitals/Pain  Pain Assessment Pain Assessment: No/denies pain    Home Living Family/patient expects to be discharged to:: Skilled nursing facility                   Additional Comments: Per chart review pt is a resident at an ALF    Prior Function Prior Level of Function : Patient poor historian/Family not available             Mobility Comments: Medical record indicates falls; pt endorses using RW prior, but poor historian. CSW stated pt is from ALF. ADLs Comments: Unable to state; pt poor historian.     Extremity/Trunk Assessment   Upper Extremity Assessment Upper Extremity Assessment: Generalized weakness    Lower Extremity Assessment Lower Extremity Assessment: Generalized weakness       Communication   Communication Communication: No apparent difficulties Cueing Techniques: Verbal cues;Visual cues;Tactile cues  Cognition Arousal: Alert Behavior During Therapy: Flat affect Overall Cognitive Status: No family/caregiver present to determine baseline cognitive functioning                                 General Comments: Pt with flat affect but alert and able to follow 1-step commands with some extra time and cuing        General Comments      Exercises Other Exercises Other Exercises: Posterior hip precaution education Other Exercises: 90 deg L turn training to prevent CKC L hip IR   Assessment/Plan    PT Assessment Patient needs continued PT services  PT Problem List Decreased strength;Decreased range of motion;Decreased activity tolerance;Decreased balance;Decreased mobility;Decreased safety awareness;Decreased knowledge of use of DME;Decreased knowledge of precautions       PT Treatment Interventions DME instruction;Balance training;Gait training;Functional mobility training;Patient/family education;Therapeutic activities;Therapeutic exercise    PT Goals (Current goals can be found in the Care Plan section)  Acute Rehab PT Goals Patient  Stated Goal: To feel better PT Goal Formulation: With patient Time For Goal Achievement: 02/02/23 Potential to Achieve Goals: Fair    Frequency BID     Co-evaluation               AM-PAC PT "6 Clicks" Mobility  Outcome Measure Help needed turning from your back to your side while in a flat bed without using bedrails?: A Little Help needed moving from lying on your back to sitting on the side of a flat bed without using bedrails?: A Lot Help needed moving to and from a bed to a chair (including a wheelchair)?: A Lot Help needed standing up from a chair using your arms (e.g., wheelchair or bedside chair)?: A Little Help needed to walk in hospital room?: A Lot Help needed climbing 3-5 steps with a railing? : Total 6 Click Score: 13    End of Session Equipment Utilized During Treatment: Gait belt Activity Tolerance: Patient tolerated treatment well Patient left: in chair;with call bell/phone within reach;with chair alarm set;Other (comment) (Two pillows set up under BLEs to prevent hip adduction) Nurse Communication: Mobility status;Precautions PT Visit Diagnosis: Muscle weakness (generalized) (M62.81);Other abnormalities of gait and mobility (R26.89)  Time: 8119-1478 PT Time Calculation (min) (ACUTE ONLY): 28 min   Charges:   PT Evaluation $PT Re-evaluation: 1 Re-eval PT Treatments $Therapeutic Activity: 8-22 mins PT General Charges $$ ACUTE PT VISIT: 1 Visit       D. Scott Pinky Ravan PT, DPT 01/20/23, 11:22 AM

## 2023-01-20 NOTE — Progress Notes (Signed)
Subjective: 1 Day Post-Op Procedure(s) (LRB): HIP REVISION, HEAD AND LINER EXCHANGE (Left) IRRIGATION AND DEBRIDEMENT THIGH (Left) Patient reports pain as mild.  Pain much improved since surgery. Patient is well, and has had no acute complaints or problems Denies any CP, SOB, ABD pain. We will continue therapy today.    Objective: Vital signs in last 24 hours: Temp:  [97.5 F (36.4 C)-98.8 F (37.1 C)] 97.8 F (36.6 C) (12/24 0229) Pulse Rate:  [65-105] 65 (12/24 0229) Resp:  [9-20] 18 (12/24 0229) BP: (80-110)/(55-79) 93/65 (12/24 0229) SpO2:  [88 %-99 %] 98 % (12/24 0229) Weight:  [78.9 kg] 78.9 kg (12/23 1126)  Intake/Output from previous day: 12/23 0701 - 12/24 0700 In: 1030 [P.O.:30; I.V.:900; IV Piggyback:100] Out: 2410 [Urine:2120; Drains:90; Blood:200] Intake/Output this shift: No intake/output data recorded.  Recent Labs    01/19/23 0545 01/20/23 0408  HGB 10.4* 10.3*   Recent Labs    01/19/23 0545 01/20/23 0408  WBC 11.5* 14.0*  RBC 4.15* 4.18*  HCT 30.9* 31.7*  PLT 376 406*   Recent Labs    01/19/23 0545 01/20/23 0408  NA 135 132*  K 3.9 4.6  CL 104 101  CO2 21* 22  BUN 16 20  CREATININE 0.78 0.96  GLUCOSE 110* 133*  CALCIUM 8.4* 8.6*   Recent Labs    01/18/23 0506  INR 1.1    EXAM General - Patient is Alert, Appropriate, and Oriented Extremity - Neurovascular intact Sensation intact distally Intact pulses distally Dorsiflexion/Plantar flexion intact No cellulitis present Compartment soft Dressing - dressing C/D/I and no drainage, provena intact with out drainage. 2 drains intact Motor Function - intact, moving foot and toes well on exam.   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)    HLD (hyperlipidemia)     HTN (hypertension)    Schizophrenia (HCC)     Assessment/Plan:   1 Day Post-Op Procedure(s) (LRB): HIP REVISION, HEAD AND LINER EXCHANGE (Left) IRRIGATION AND DEBRIDEMENT THIGH (Left) Principal Problem:   SIRS (systemic inflammatory response syndrome) (HCC) Active Problems:   Hypokalemia   Schizophrenia (HCC)   Chronic obstructive pulmonary disease (HCC)   HTN (hypertension)   AF (paroxysmal atrial fibrillation) (HCC)   Fall at home, initial encounter   Hyponatremia   Prolonged QT interval   Electrolyte disturbance   Hypophosphatemia   Acute metabolic encephalopathy   Left shoulder pain   Hypomagnesemia   Hypotension   E coli bacteremia   Sepsis without acute organ dysfunction (HCC)   Abscess of hip, left   Prosthetic joint infection (HCC)  Estimated body mass index is 24.27 kg/m as calculated from the following:   Height as of this encounter: 5\' 11"  (1.803 m).   Weight as of this encounter: 78.9 kg. Advance diet Up with therapy Pain well controlled Labs and VSS Continue with IV abx per ID Remove drains once no drainage in 12 hr shift Remove provena and apply honeycomb dressing 7 days after discharge CM to assist with discharge to SNF  Follow up with [facility] in 2 weeks Lovenox 40 mg SQ daily x 14 days at discharge   DVT Prophylaxis - Lovenox, TED hose, and SCDs Weight-Bearing as tolerated to left leg   T. Cranston Neighbor,  PA-C [facility] 01/20/2023, 8:23 AM

## 2023-01-20 NOTE — NC FL2 (Signed)
[place] MEDICAID FL2 LEVEL OF CARE FORM     IDENTIFICATION  Patient Name: Adrian Gillespie Birthdate: 1950/07/26 Sex: male Admission Date (Current Location): 01/13/2023  [facility] and [place] Number:  Chiropodist and Address:  [facility], [address], [place], [place] [address]      Provider Number: [id number]  Attending Physician Name and Address:  Sunnie Nielsen, DO  Relative Name and Phone Number:  Gleen, Nortz (Sister)  [phone]    Current Level of Care: Hospital Recommended Level of Care: Skilled Nursing Facility Prior Approval Number:    Date Approved/Denied:   PASRR Number: pending  Discharge Plan: SNF    Current Diagnoses: Patient Active Problem List   Diagnosis Date Noted   Prosthetic joint infection (HCC) 01/20/2023   Abscess of hip, left 01/18/2023   E coli bacteremia 01/15/2023   Sepsis without acute organ dysfunction (HCC) 01/15/2023   Electrolyte disturbance 01/14/2023   Hypophosphatemia 01/14/2023   Acute metabolic encephalopathy 01/14/2023   Left shoulder pain 01/14/2023   Hypomagnesemia 01/14/2023   Hypotension 01/14/2023   SIRS (systemic inflammatory response syndrome) (HCC) 01/13/2023   Fall at home, initial encounter 01/13/2023   Hyponatremia 01/13/2023   Prolonged QT interval 01/13/2023   Closed right hip fracture (HCC) 09/19/2022   Depression 09/19/2022   GERD without esophagitis 09/19/2022   Fall 09/19/2022   Closed displaced intertrochanteric fracture of right femur (HCC) 09/19/2022   Right hip pain 09/19/2022   Lethargy    Acquired thrombophilia (HCC)    Intractable pain 02/02/2020   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   Femur fracture, left (HCC) 02/02/2020   AF (paroxysmal atrial fibrillation) (HCC) 02/02/2020   Malnutrition of moderate degree 05/26/2019   Acute respiratory failure with hypoxemia (HCC) 05/26/2019   CAP (community acquired pneumonia) 04/20/2019   PVC (premature ventricular  contraction) 04/20/2019   GERD (gastroesophageal reflux disease) 04/20/2019   Acute on chronic respiratory failure with hypoxia (HCC)    HTN (hypertension)    Chronic obstructive pulmonary disease (HCC)    NSVT (nonsustained ventricular tachycardia) (HCC)    Acute respiratory failure with hypoxia (HCC) 04/02/2019   Hypokalemia 04/02/2019   Schizophrenia (HCC) 04/02/2019    Orientation RESPIRATION BLADDER Height & Weight     Self, Time, Place  O2 Incontinent Weight: 174 lb (78.9 kg) Height:  5\' 11"  (180.3 cm)  BEHAVIORAL SYMPTOMS/MOOD NEUROLOGICAL BOWEL NUTRITION STATUS      Continent    AMBULATORY STATUS COMMUNICATION OF NEEDS Skin   Limited Assist Verbally Wound Vac (Disposable prevena)                       Personal Care Assistance Level of Assistance  Bathing, Feeding, Dressing Bathing Assistance: Limited assistance Feeding assistance: Limited assistance Dressing Assistance: Limited assistance     Functional Limitations Info  Sight, Hearing, Speech Sight Info: Adequate Hearing Info: Adequate Speech Info: Adequate    SPECIAL CARE FACTORS FREQUENCY  PT (By licensed PT), OT (By licensed OT)     PT Frequency: 5 times a week OT Frequency: 5 times a week            Contractures Contractures Info: Not present    Additional Factors Info  Code Status, Allergies Code Status Info: FULL Allergies Info: shellfish-derived products           Current Medications (01/20/2023):  This is the current hospital active medication list Current Facility-Administered Medications  Medication Dose Route Frequency Provider Last Rate Last Admin  acetaminophen (TYLENOL) tablet 650 mg  650 mg Oral Q6H PRN Lorretta Harp, MD       albuterol (PROVENTIL) (2.5 MG/3ML) 0.083% nebulizer solution 2.5 mg  2.5 mg Nebulization Q4H PRN Lorretta Harp, MD       benztropine (COGENTIN) tablet 1 mg  1 mg Oral Daily Lorretta Harp, MD   1 mg at 01/20/23 1610   carvedilol (COREG) tablet 3.125 mg  3.125 mg  Oral BID WC Kathrynn Running, MD   3.125 mg at 01/20/23 9604   cefTRIAXone (ROCEPHIN) 2 g in sodium chloride 0.9 % 100 mL IVPB  2 g Intravenous Daily Kathrynn Running, MD 200 mL/hr at 01/20/23 1155 2 g at 01/20/23 1155   dextromethorphan-guaiFENesin (MUCINEX DM) 30-600 MG per 12 hr tablet 1 tablet  1 tablet Oral BID PRN Lorretta Harp, MD   1 tablet at 01/15/23 2106   diphenhydrAMINE (BENADRYL) injection 12.5 mg  12.5 mg Intravenous Q8H PRN Lorretta Harp, MD       docusate sodium (COLACE) capsule 100 mg  100 mg Oral BID Reinaldo Berber, MD   100 mg at 01/20/23 0823   enoxaparin (LOVENOX) injection 40 mg  40 mg Subcutaneous Q24H Evon Slack, PA-C   40 mg at 01/20/23 1017   feeding supplement (ENSURE ENLIVE / ENSURE PLUS) liquid 237 mL  237 mL Oral BID BM Lorretta Harp, MD   237 mL at 01/20/23 5409   haloperidol (HALDOL) tablet 10 mg  10 mg Oral BID Kathrynn Running, MD   10 mg at 01/20/23 8119   HYDROcodone-acetaminophen (NORCO/VICODIN) 5-325 MG per tablet 1-2 tablet  1-2 tablet Oral Q4H PRN Reinaldo Berber, MD   2 tablet at 01/20/23 1124   hydrOXYzine (ATARAX) tablet 50 mg  50 mg Oral BID PRN Lorretta Harp, MD   50 mg at 01/15/23 2106   LORazepam (ATIVAN) injection 2 mg  2 mg Intravenous Once PRN Wouk, Wilfred Curtis, MD       magnesium hydroxide (MILK OF MAGNESIA) suspension 30 mL  30 mL Oral Daily PRN Lorretta Harp, MD       menthol-cetylpyridinium (CEPACOL) lozenge 3 mg  1 lozenge Oral PRN Reinaldo Berber, MD       Or   phenol (CHLORASEPTIC) mouth spray 1 spray  1 spray Mouth/Throat PRN Reinaldo Berber, MD       metoCLOPramide (REGLAN) tablet 5-10 mg  5-10 mg Oral Q8H PRN Reinaldo Berber, MD       Or   metoCLOPramide (REGLAN) injection 5-10 mg  5-10 mg Intravenous Q8H PRN Reinaldo Berber, MD       midodrine (PROAMATINE) tablet 10 mg  10 mg Oral TID WC Lorretta Harp, MD   10 mg at 01/20/23 1152   mometasone-formoterol (DULERA) 100-5 MCG/ACT inhaler 2 puff  2 puff Inhalation BID Kathrynn Running,  MD   2 puff at 01/20/23 0824   montelukast (SINGULAIR) tablet 10 mg  10 mg Oral Daily Lorretta Harp, MD   10 mg at 01/20/23 1478   morphine (PF) 2 MG/ML injection 0.5-1 mg  0.5-1 mg Intravenous Q2H PRN Reinaldo Berber, MD       multivitamin with minerals tablet 1 tablet  1 tablet Oral Daily Lorretta Harp, MD   1 tablet at 01/20/23 2956   nicotine (NICODERM CQ - dosed in mg/24 hours) patch 21 mg  21 mg Transdermal Daily Lorretta Harp, MD   21 mg at 01/20/23 0823   pantoprazole (PROTONIX) EC tablet 40 mg  40  mg Oral Daily Lorretta Harp, MD   40 mg at 01/20/23 1610   sodium chloride flush (NS) 0.9 % injection 5 mL  5 mL Intracatheter Q8H Mir, Al Corpus, MD       tamsulosin [facility]) capsule 0.4 mg  0.4 mg Oral QPC supper Kathrynn Running, MD   0.4 mg at 01/19/23 1731   traZODone (DESYREL) tablet 300 mg  300 mg Oral QHS Kathrynn Running, MD   300 mg at 01/19/23 2106     Discharge Medications: Please see discharge summary for a list of discharge medications.  Relevant Imaging Results:  Relevant Lab Results:   Additional Information SS #: [id number], Pt will need 6 weeks of antibiotics.  Matty Vanroekel Genelle Gather, LCSW

## 2023-01-20 NOTE — TOC Progression Note (Signed)
Transition of Care [facility]) - Progression Note    Patient Details  Name: Adrian Gillespie MRN: [id number] Date of Birth: [birth year]  [facility] CM/SW Contact  Chapman Fitch, RN Phone Number: 01/20/2023, 3:58 PM  Clinical Narrative:     Patient will require IV antibiotics and prevena Vac at discharge. Per Amalia Hailey at the [place] patient can not return to ALF with IV antibiotics  Sister [name] aware and in agreement for bed search  PASRR pending  Bed search initiated   Expected Discharge Plan: Assisted Living (with home health) Barriers to Discharge: Continued Medical Work up  Expected Discharge Plan and Services       Living arrangements for the past 2 months: Assisted Living Facility                             [facility] Agency: [facility] Date [facility] Agency Contacted: 01/15/23   Representative spoke with at [facility] Agency: Unknown [place]   Social Determinants of Health (SDOH) Interventions SDOH Screenings   Food Insecurity: No Food Insecurity (01/15/2023)  Housing: Low Risk  (01/15/2023)  Transportation Needs: No Transportation Needs (01/15/2023)  Utilities: Not At Risk (01/15/2023)  Financial Resource Strain: Medium Risk (06/26/2022)   Received from [facility], [facility]  Physical Activity: Sufficiently Active (06/26/2022)   Received from [facility], [facility]  Social Connections: Moderately Isolated (07/18/2020)   Received from [facility], [facility]  Stress: No Stress Concern Present (06/26/2022)   Received from [facility], [facility]  Tobacco Use: High Risk (01/19/2023)  Health Literacy: High Risk (06/26/2022)   Received from [facility], [facility]    Readmission Risk Interventions    01/16/2023    1:02 PM 01/15/2023    9:35 AM 09/20/2022    3:20 PM  Readmission Risk Prevention Plan  Transportation Screening Complete  Complete  PCP or Specialist Appt within 3-5 Days  Complete Complete  HRI or  Home Care Consult Complete Complete Complete  Social Work Consult for Recovery Care Planning/Counseling Complete Complete Complete  Palliative Care Screening Complete Not Applicable Not Applicable  Medication Review [employer]) Complete  Complete

## 2023-01-20 NOTE — Progress Notes (Addendum)
Patient was due to void at 1200 today, no void. Bladder scan equals 267. DO Adrian Gillespie was notified and made aware. No orders to in and out cath at this time.

## 2023-01-20 NOTE — Progress Notes (Signed)
PROGRESS NOTE    Adrian Gillespie   ZOX:[id number] DOB: 1950/04/11  DOA: 01/13/2023 Date of Service: 01/20/23 which is hospital day 7  PCP: Andres Shad, MD    HPI: Adrian Gillespie is a 72 y.o. male with medical history significant of HTN, HLD, COPD, asthma, GERD, depression, anxiety, schizophrenia, HBV, tobacco abuse, who presents to ED from assisted living facility with AMS, fever and fall.   Hospital course / significant events:  12/17: admitted to hospitalist service, sepsis 12/18: EEG, MRI brain no acute concerns 12/19: BCx (+)Ecoli, ID consulted, plan further imaging to delineate source. Psychiatry consulted for med management 12/20: Afib RVR, resolved. Started low dose beta blocker. CT abd/pelv concern for discitis L3/L4, noted large L hip effusion 12/21: MRI L spine no discitis/osteo 12/22: aspiration L hip effusion w/ concern for purulent fluid, ortho plan for OR tomorrow  12/23: to OR for joint washout L hip  12/24: stable postop, plan leave drains in place for now, will need long term IV abx which cannot be administered at ALF so SNF placement is underway   Consultants:  Neurology - informal discussion 12/18 w/ recs EEG and MRI, avoid cefepime  Infectious disease  Psychiatry  Orthopedics   Procedures/Surgeries: 01/18/23: L hip/thigh abscess aspiration w/ drain placement  01/19/23: Left hip and thigh irrigation and debridement with revision head and liner exchange with placement of custom made antibiotic beads       ASSESSMENT & PLAN:    E coli bacteremia No abdominal pain or diarrhea to suggest intra-abdominal pathology though patient unable to provide accurate history. Not retaining urine - PVR is 0. CT abdomen no intra-abdominal infection. Denied hip pain but CT of abdomen incidentally showed large left hip effusion and IR aspiration revealed purulent fluid in the left hip tracking into the thigh continue ceftriaxone ID following  See below    Left hip  septic arthritis Large effusion, incidental on CT. IR aspiration encountered purulent fluid in the left hip tracking into the [facility] performed wash-out 12/23  Orthopedics following  WBAT LLE Remove drains once no drainage in 12h shift Remove provena and apply honeycomb dressing 7 days after discharge  Follow up in office with [facility] in 2 weeks Lovenox 40 mg SQ daily x 14 days at discharge  Subacute compression fracture On MRI. CT showed possible signs infection but none seen on mri Pain control as needed  Urinary retention Foley out today Void trial  In/out cath as needed  Acute encephalopathy w/ seizure concern, seizures reasonably ruled out  Encephalopathy more likely metabolic d/t sepsis  Report of fall day prior to admission, shaking, and confusion afterward that has now resolved. Discussed w/ neuro, hard to tell what's going on here. They advised mri (nothing acute) and eeg (no seizure-like activity).  clinical monitoring   Frequent falls Likely multifactorial PT consulteded, thinks he is close to his baseline, advising return to ALF though will need to be re-evaluated after surgery   Paroxysmal a-fib With rvr morning of 12/20, resolved. Chads2vasc 1 not anticoagulated, nor is he on a rate/rhythm control agent low-dose bb. Dig is an option but not a great one. Currently tolerating coreg but soft BP.   Hyponatremia Likely hypovolemic as has resolved  with hydration monitor off fluids   Hypokalemia hypomagnesmia replete and monitor  QTC prolongation Likely 2/2 psych meds exacerbated by electrolyte abnormalities, has improved on most recent EKG treat electrolyte abnormalities as above   COPD Quiescent home inhalers   Hypotension Likely  multifactorial. Meds likely contributing but needs beta blocker for rate control given recent RVR.  continue midodrine Caution w/ beta blocker rate control    Schizophrenia Ck wnl. Intermittent agitation, none the last few  days continue home haldol on monthly invega as well trazodone at bedtime   Chronic pain stable Pain control as needed      DVT prophylaxis: holding Rx PPx perioperatively IV fluids: no continuous IV fluids  Nutrition: regular diet Central lines / invasive devices: drain x2 L hip,  wound vac L hip   Code Status: FULL CODE ACP documentation reviewed:  none on file in [facility]  TOC needs: SNF Barriers to dispo / significant pending items: wound vac, drains, monitoring postop, continuing IV abx, will not be able to go back to ALF on          Subjective / Brief ROS:  Patient reports hip pain but otherwise no concerns at this time.  Tolerating diet No CP/SOB  Family Communication:  spoke on phone w/ sister 01/20/23 2:29 PM     Objective Findings:  Vitals:   01/20/23 0023 01/20/23 0229 01/20/23 0900 01/20/23 1000  BP: (!) 85/60 93/65  102/78  Pulse: 67 65  77  Resp: 16 18    Temp: 97.8 F (36.6 C) 97.8 F (36.6 C)  97.8 F (36.6 C)  TempSrc:    Oral  SpO2: 99% 98% 97% 98%  Weight:      Height:        Intake/Output Summary (Last 24 hours) at 01/20/2023 1429 Last data filed at 01/20/2023 0600 Gross per 24 hour  Intake 930 ml  Output 1910 ml  Net -980 ml   Filed Weights   01/13/23 2200 01/19/23 1126  Weight: 78.9 kg 78.9 kg    Examination:  Physical Exam Constitutional:      General: He is not in acute distress. Cardiovascular:     Rate and Rhythm: Normal rate and regular rhythm.  Pulmonary:     Effort: Pulmonary effort is normal.     Breath sounds: Normal breath sounds.  Musculoskeletal:     Right lower leg: No edema.     Left lower leg: No edema.  Skin:    General: Skin is warm and dry.  Neurological:     General: No focal deficit present.     Mental Status: He is alert. Mental status is at baseline.  Psychiatric:        Mood and Affect: Mood normal.        Behavior: Behavior normal.          Scheduled Medications:   benztropine   1 mg Oral Daily   carvedilol  3.125 mg Oral BID WC   docusate sodium  100 mg Oral BID   enoxaparin (LOVENOX) injection  40 mg Subcutaneous Q24H   feeding supplement  237 mL Oral BID BM   haloperidol  10 mg Oral BID   midodrine  10 mg Oral TID WC   mometasone-formoterol  2 puff Inhalation BID   montelukast  10 mg Oral Daily   multivitamin with minerals  1 tablet Oral Daily   nicotine  21 mg Transdermal Daily   pantoprazole  40 mg Oral Daily   sodium chloride flush  5 mL Intracatheter Q8H   tamsulosin  0.4 mg Oral QPC supper   traZODone  300 mg Oral QHS    Continuous Infusions:  cefTRIAXone (ROCEPHIN)  IV 2 g (01/20/23 1155)    PRN Medications:  acetaminophen, albuterol, dextromethorphan-guaiFENesin, diphenhydrAMINE, HYDROcodone-acetaminophen, hydrOXYzine, LORazepam, magnesium hydroxide, menthol-cetylpyridinium **OR** phenol, metoCLOPramide **OR** metoCLOPramide (REGLAN) injection, morphine injection  Antimicrobials from admission:  Anti-infectives (From admission, onward)    Start     Dose/Rate Route Frequency Ordered Stop   01/19/23 1403  gentamicin (GARAMYCIN) injection  Status:  Discontinued          As needed 01/19/23 1403 01/19/23 1523   01/19/23 1402  vancomycin (VANCOCIN) powder  Status:  Discontinued          As needed 01/19/23 1402 01/19/23 1523   01/14/23 2300  vancomycin (VANCOREADY) IVPB 1750 mg/350 mL  Status:  Discontinued        1,750 mg 175 mL/hr over 120 Minutes Intravenous Every 24 hours 01/14/23 0352 01/14/23 0840   01/14/23 1400  cefTRIAXone (ROCEPHIN) 2 g in sodium chloride 0.9 % 100 mL IVPB        2 g 200 mL/hr over 30 Minutes Intravenous Daily 01/14/23 1203     01/14/23 0900  metroNIDAZOLE (FLAGYL) IVPB 500 mg  Status:  Discontinued        500 mg 100 mL/hr over 60 Minutes Intravenous  Once 01/14/23 0124 01/14/23 0840   01/14/23 0700  ceFEPIme (MAXIPIME) 2 g in sodium chloride 0.9 % 100 mL IVPB  Status:  Discontinued        2 g 200 mL/hr over 30 Minutes  Intravenous Every 8 hours 01/14/23 0349 01/14/23 0840   01/13/23 2215  vancomycin (VANCOREADY) IVPB 1750 mg/350 mL  Status:  Discontinued        1,750 mg 175 mL/hr over 120 Minutes Intravenous  Once 01/13/23 2208 01/13/23 2211   01/13/23 2215  vancomycin (VANCOCIN) IVPB 1000 mg/200 mL premix       Placed in "Followed by" Linked Group   1,000 mg 200 mL/hr over 60 Minutes Intravenous  Once 01/13/23 2211 01/14/23 0021   01/13/23 2215  vancomycin (VANCOREADY) IVPB 750 mg/150 mL       Placed in "Followed by" Linked Group   750 mg 150 mL/hr over 60 Minutes Intravenous  Once 01/13/23 2211 01/14/23 0120   01/13/23 2130  ceFEPIme (MAXIPIME) 2 g in sodium chloride 0.9 % 100 mL IVPB        2 g 200 mL/hr over 30 Minutes Intravenous  Once 01/13/23 2123 01/13/23 2319   01/13/23 2130  metroNIDAZOLE (FLAGYL) IVPB 500 mg        500 mg 100 mL/hr over 60 Minutes Intravenous  Once 01/13/23 2123 01/13/23 2319   01/13/23 2130  vancomycin (VANCOCIN) IVPB 1000 mg/200 mL premix  Status:  Discontinued        1,000 mg 200 mL/hr over 60 Minutes Intravenous  Once 01/13/23 2123 01/13/23 2207           Data Reviewed:  I have personally reviewed the following...  CBC: Recent Labs  Lab 01/13/23 2129 01/14/23 0657 01/19/23 0545 01/20/23 0408  WBC 12.7* 12.9* 11.5* 14.0*  NEUTROABS 11.6*  --   --   --   HGB 10.2* 10.5* 10.4* 10.3*  HCT 30.4* 31.7* 30.9* 31.7*  MCV 73.4* 74.8* 74.5* 75.8*  PLT 284 263 376 406*   Basic Metabolic Panel: Recent Labs  Lab 01/13/23 2128 01/13/23 2129 01/13/23 2333 01/14/23 0657 01/15/23 0447 01/16/23 0518 01/17/23 0514 01/19/23 0545 01/20/23 0408  NA  --    < > 128* 138 133* 138 136 135 132*  K  --    < > 3.1* 4.6 4.1 3.9  3.8 3.9 4.6  CL  --    < > 99 110 104 105 105 104 101  CO2  --    < > 21* 21* 21* 25 24 21* 22  GLUCOSE  --    < > 148* 189* 99 115* 106* 110* 133*  BUN  --    < > 11 9 10 12 11 16 20   CREATININE  --    < > 0.93 0.79 0.75 0.78 0.69 0.78 0.96   CALCIUM  --    < > 7.7* 7.7* 7.9* 8.0* 8.2* 8.4* 8.6*  MG 1.5*  --  1.4* 2.6* 2.0  --  1.9  --   --   PHOS  --   --  2.0* 4.3 2.5  --  3.6  --   --    < > = values in this interval not displayed.   GFR: Estimated Creatinine Clearance: 75.2 mL/min (by C-G formula based on SCr of 0.96 mg/dL). Liver Function Tests: Recent Labs  Lab 01/13/23 2129  AST 22  ALT 18  ALKPHOS 87  BILITOT 1.4*  PROT 6.3*  ALBUMIN 2.4*   No results for input(s): "LIPASE", "AMYLASE" in the last 168 hours. No results for input(s): "AMMONIA" in the last 168 hours. Coagulation Profile: Recent Labs  Lab 01/13/23 2129 01/18/23 0506  INR 1.3* 1.1   Cardiac Enzymes: Recent Labs  Lab 01/13/23 2128  CKTOTAL 33*   BNP (last 3 results) No results for input(s): "PROBNP" in the last 8760 hours. HbA1C: No results for input(s): "HGBA1C" in the last 72 hours. CBG: No results for input(s): "GLUCAP" in the last 168 hours. Lipid Profile: No results for input(s): "CHOL", "HDL", "LDLCALC", "TRIG", "CHOLHDL", "LDLDIRECT" in the last 72 hours. Thyroid Function Tests: No results for input(s): "TSH", "T4TOTAL", "FREET4", "T3FREE", "THYROIDAB" in the last 72 hours. Anemia Panel: No results for input(s): "VITAMINB12", "FOLATE", "FERRITIN", "TIBC", "IRON", "RETICCTPCT" in the last 72 hours. Most Recent Urinalysis On File:     Component Value Date/Time   COLORURINE YELLOW (A) 01/13/2023 2130   APPEARANCEUR CLEAR (A) 01/13/2023 2130   LABSPEC 1.010 01/13/2023 2130   PHURINE 6.0 01/13/2023 2130   GLUCOSEU NEGATIVE 01/13/2023 2130   HGBUR SMALL (A) 01/13/2023 2130   BILIRUBINUR NEGATIVE 01/13/2023 2130   KETONESUR NEGATIVE 01/13/2023 2130   PROTEINUR NEGATIVE 01/13/2023 2130   NITRITE NEGATIVE 01/13/2023 2130   LEUKOCYTESUR NEGATIVE 01/13/2023 2130   Sepsis Labs: @LABRCNTIP (procalcitonin:4,lacticidven:4) Microbiology: Recent Results (from the past 240 hours)  Resp panel by RT-PCR (RSV, Flu A&B, Covid) Anterior  Nasal Swab     Status: None   Collection Time: 01/13/23  9:29 PM   Specimen: Anterior Nasal Swab  Result Value Ref Range Status   SARS Coronavirus 2 by RT PCR NEGATIVE NEGATIVE Final    Comment: (NOTE) SARS-CoV-2 target nucleic acids are NOT DETECTED.  The SARS-CoV-2 RNA is generally detectable in upper respiratory specimens during the acute phase of infection. The lowest concentration of SARS-CoV-2 viral copies this assay can detect is 138 copies/mL. A negative result does not preclude SARS-Cov-2 infection and should not be used as the sole basis for treatment or other patient management decisions. A negative result may occur with  improper specimen collection/handling, submission of specimen other than nasopharyngeal swab, presence of viral mutation(s) within the areas targeted by this assay, and inadequate number of viral copies(<138 copies/mL). A negative result must be combined with clinical observations, patient history, and epidemiological information. The expected result is Negative.  Fact Sheet for Patients:  BloggerCourse.com  Fact Sheet for Healthcare Providers:  SeriousBroker.it  This test is no t yet approved or cleared by the [place] FDA and  has been authorized for detection and/or diagnosis of SARS-CoV-2 by FDA under an Emergency Use Authorization (EUA). This EUA will remain  in effect (meaning this test can be used) for the duration of the COVID-19 declaration under Section 564(b)(1) of the Act, 21 U.S.C.section 360bbb-3(b)(1), unless the authorization is terminated  or revoked sooner.       Influenza A by PCR NEGATIVE NEGATIVE Final   Influenza B by PCR NEGATIVE NEGATIVE Final    Comment: (NOTE) The Xpert Xpress SARS-CoV-2/FLU/RSV plus assay is intended as an aid in the diagnosis of influenza from Nasopharyngeal swab specimens and should not be used as a sole basis for treatment. Nasal washings  and aspirates are unacceptable for Xpert Xpress SARS-CoV-2/FLU/RSV testing.  Fact Sheet for Patients: BloggerCourse.com  Fact Sheet for Healthcare Providers: SeriousBroker.it  This test is not yet approved or cleared by the [place] FDA and has been authorized for detection and/or diagnosis of SARS-CoV-2 by FDA under an Emergency Use Authorization (EUA). This EUA will remain in effect (meaning this test can be used) for the duration of the COVID-19 declaration under Section 564(b)(1) of the Act, 21 U.S.C. section 360bbb-3(b)(1), unless the authorization is terminated or revoked.     Resp Syncytial Virus by PCR NEGATIVE NEGATIVE Final    Comment: (NOTE) Fact Sheet for Patients: BloggerCourse.com  Fact Sheet for Healthcare Providers: SeriousBroker.it  This test is not yet approved or cleared by the [place] FDA and has been authorized for detection and/or diagnosis of SARS-CoV-2 by FDA under an Emergency Use Authorization (EUA). This EUA will remain in effect (meaning this test can be used) for the duration of the COVID-19 declaration under Section 564(b)(1) of the Act, 21 U.S.C. section 360bbb-3(b)(1), unless the authorization is terminated or revoked.  Performed at [facility], [address], [place], [place] [address]   Blood Culture (routine x 2)     Status: Abnormal   Collection Time: 01/13/23  9:29 PM   Specimen: BLOOD  Result Value Ref Range Status   Specimen Description   Final    BLOOD BLOOD LEFT ARM Performed at [facility], [address] [place], [place] [address]    Special Requests   Final    BOTTLES DRAWN AEROBIC AND ANAEROBIC Blood Culture adequate volume Performed at [facility], [address] [place], [place] [address]    Culture  Setup Time   Final    Organism ID to follow ANAEROBIC BOTTLE ONLY GRAM  NEGATIVE RODS CRITICAL RESULT CALLED TO, READ BACK BY AND VERIFIED WITH: TREY GREENWOOD 01/14/23 1142 KLW Performed at [facility], [address] [place], [place] [address]    Culture ESCHERICHIA COLI (A)  Final   Report Status 01/16/2023 FINAL  Final   Organism ID, Bacteria ESCHERICHIA COLI  Final   Organism ID, Bacteria ESCHERICHIA COLI  Final      Susceptibility   Escherichia coli - KIRBY BAUER*    CEFAZOLIN INTERMEDIATE Intermediate    Escherichia coli - MIC*    AMPICILLIN >=32 RESISTANT Resistant     CEFEPIME <=0.12 SENSITIVE Sensitive     CEFTAZIDIME <=1 SENSITIVE Sensitive     CEFTRIAXONE <=0.25 SENSITIVE Sensitive     CIPROFLOXACIN <=0.25 SENSITIVE Sensitive     GENTAMICIN <=1 SENSITIVE Sensitive     IMIPENEM <=  0.25 SENSITIVE Sensitive     TRIMETH/SULFA <=20 SENSITIVE Sensitive     AMPICILLIN/SULBACTAM 16 INTERMEDIATE Intermediate     PIP/TAZO <=4 SENSITIVE Sensitive ug/mL    * ESCHERICHIA COLI    ESCHERICHIA COLI  Urine Culture     Status: None   Collection Time: 01/13/23  9:29 PM   Specimen: Urine, Clean Catch  Result Value Ref Range Status   Specimen Description   Final    URINE, CLEAN CATCH Performed at [facility], [address], [place], [place] [address]    Special Requests   Final    NONE Performed at [facility], [address], [place], [place] [address]    Culture   Final    NO GROWTH Performed at [facility], [address], [place], [place] [address]    Report Status 01/15/2023 FINAL  Final  Blood Culture ID Panel (Reflexed)     Status: Abnormal   Collection Time: 01/13/23  9:29 PM  Result Value Ref Range Status   Enterococcus faecalis NOT DETECTED NOT DETECTED Final   Enterococcus Faecium NOT DETECTED NOT DETECTED Final   Listeria monocytogenes NOT DETECTED NOT DETECTED Final   Staphylococcus species NOT DETECTED NOT DETECTED Final   Staphylococcus aureus (BCID) NOT DETECTED NOT DETECTED Final    Staphylococcus epidermidis NOT DETECTED NOT DETECTED Final   Staphylococcus lugdunensis NOT DETECTED NOT DETECTED Final   Streptococcus species NOT DETECTED NOT DETECTED Final   Streptococcus agalactiae NOT DETECTED NOT DETECTED Final   Streptococcus pneumoniae NOT DETECTED NOT DETECTED Final   Streptococcus pyogenes NOT DETECTED NOT DETECTED Final   A.calcoaceticus-baumannii NOT DETECTED NOT DETECTED Final   Bacteroides fragilis NOT DETECTED NOT DETECTED Final   Enterobacterales DETECTED (A) NOT DETECTED Final    Comment: Enterobacterales represent a large order of gram negative bacteria, not a single organism. CRITICAL RESULT CALLED TO, READ BACK BY AND VERIFIED WITH: TREY GREENWOOD 01/14/23 1142 KLW    Enterobacter cloacae complex NOT DETECTED NOT DETECTED Final   Escherichia coli DETECTED (A) NOT DETECTED Final    Comment: CRITICAL RESULT CALLED TO, READ BACK BY AND VERIFIED WITH: TREY GREENWOOD 01/14/23 1142 KLW    Klebsiella aerogenes NOT DETECTED NOT DETECTED Final   Klebsiella oxytoca NOT DETECTED NOT DETECTED Final   Klebsiella pneumoniae NOT DETECTED NOT DETECTED Final   Proteus species NOT DETECTED NOT DETECTED Final   Salmonella species NOT DETECTED NOT DETECTED Final   Serratia marcescens NOT DETECTED NOT DETECTED Final   Haemophilus influenzae NOT DETECTED NOT DETECTED Final   Neisseria meningitidis NOT DETECTED NOT DETECTED Final   Pseudomonas aeruginosa NOT DETECTED NOT DETECTED Final   Stenotrophomonas maltophilia NOT DETECTED NOT DETECTED Final   Candida albicans NOT DETECTED NOT DETECTED Final   Candida auris NOT DETECTED NOT DETECTED Final   Candida glabrata NOT DETECTED NOT DETECTED Final   Candida krusei NOT DETECTED NOT DETECTED Final   Candida parapsilosis NOT DETECTED NOT DETECTED Final   Candida tropicalis NOT DETECTED NOT DETECTED Final   Cryptococcus neoformans/gattii NOT DETECTED NOT DETECTED Final   CTX-M ESBL NOT DETECTED NOT DETECTED Final    Carbapenem resistance IMP NOT DETECTED NOT DETECTED Final   Carbapenem resistance KPC NOT DETECTED NOT DETECTED Final   Carbapenem resistance NDM NOT DETECTED NOT DETECTED Final   Carbapenem resist OXA 48 LIKE NOT DETECTED NOT DETECTED Final   Carbapenem resistance VIM NOT DETECTED NOT DETECTED Final    Comment: Performed at [facility], [address] [place], [place] [address]  Blood Culture (routine x 2)     Status: None   Collection Time: 01/13/23 11:06 PM   Specimen: BLOOD  Result Value Ref Range Status   Specimen Description BLOOD RIGHT ARM  Final   Special Requests   Final    BOTTLES DRAWN AEROBIC AND ANAEROBIC Blood Culture adequate volume   Culture   Final    NO GROWTH 5 DAYS Performed at [facility], [address] [place], [place] [address]    Report Status 01/19/2023 FINAL  Final  Respiratory (~20 pathogens) panel by PCR     Status: None   Collection Time: 01/14/23 12:15 PM   Specimen: Nasopharyngeal Swab; Respiratory  Result Value Ref Range Status   Adenovirus NOT DETECTED NOT DETECTED Final   Coronavirus 229E NOT DETECTED NOT DETECTED Final    Comment: (NOTE) The Coronavirus on the Respiratory Panel, DOES NOT test for the novel  Coronavirus (2019 nCoV)    Coronavirus HKU1 NOT DETECTED NOT DETECTED Final   Coronavirus NL63 NOT DETECTED NOT DETECTED Final   Coronavirus OC43 NOT DETECTED NOT DETECTED Final   Metapneumovirus NOT DETECTED NOT DETECTED Final   Rhinovirus / Enterovirus NOT DETECTED NOT DETECTED Final   Influenza A NOT DETECTED NOT DETECTED Final   Influenza B NOT DETECTED NOT DETECTED Final   Parainfluenza Virus 1 NOT DETECTED NOT DETECTED Final   Parainfluenza Virus 2 NOT DETECTED NOT DETECTED Final   Parainfluenza Virus 3 NOT DETECTED NOT DETECTED Final   Parainfluenza Virus 4 NOT DETECTED NOT DETECTED Final   Respiratory Syncytial Virus NOT DETECTED NOT DETECTED Final   Bordetella pertussis NOT DETECTED NOT DETECTED Final    Bordetella Parapertussis NOT DETECTED NOT DETECTED Final   Chlamydophila pneumoniae NOT DETECTED NOT DETECTED Final   Mycoplasma pneumoniae NOT DETECTED NOT DETECTED Final    Comment: Performed at [facility], [address] [place], [place] [address]  Culture, blood (Routine X 2) w Reflex to ID Panel     Status: None   Collection Time: 01/15/23  9:14 AM   Specimen: Left Antecubital; Blood  Result Value Ref Range Status   Specimen Description LEFT ANTECUBITAL  Final   Special Requests   Final    BLOOD BOTTLES DRAWN AEROBIC AND ANAEROBIC Blood Culture adequate volume   Culture   Final    NO GROWTH 5 DAYS Performed at [facility], [address] [place], [place] [address]    Report Status 01/20/2023 FINAL  Final  Culture, blood (Routine X 2) w Reflex to ID Panel     Status: None   Collection Time: 01/15/23  9:15 AM   Specimen: BLOOD RIGHT HAND  Result Value Ref Range Status   Specimen Description BLOOD RIGHT HAND  Final   Special Requests   Final    BLOOD BOTTLES DRAWN AEROBIC AND ANAEROBIC Blood Culture adequate volume   Culture   Final    NO GROWTH 5 DAYS Performed at [facility], [address] [place], [place] [address]    Report Status 01/20/2023 FINAL  Final  Aerobic/Anaerobic Culture w Gram Stain (surgical/deep wound)     Status: None (Preliminary result)   Collection Time: 01/18/23 11:34 AM   Specimen: Drain; Body Fluid  Result Value Ref Range Status   Specimen Description   Final    JP DRAINAGE Performed at [facility], [address], [place], [place] [address]    Special Requests  Final    NONE Performed at [facility], [address] [place], [place] [address]    Gram Stain   Final    ABUNDANT WBC PRESENT, PREDOMINANTLY PMN NO ORGANISMS SEEN    Culture   Final    NO GROWTH 2 DAYS Performed at [facility], [address] [place], [place] [address]    Report Status PENDING  Incomplete   Aerobic/Anaerobic Culture w Gram Stain (surgical/deep wound)     Status: None (Preliminary result)   Collection Time: 01/19/23  1:45 PM   Specimen: Path Tissue  Result Value Ref Range Status   Specimen Description   Final    TISSUE Performed at [facility], [address], [place], [place] [address]    Special Requests LEFT HIP  Final   Gram Stain   Final    RARE WBC PRESENT, PREDOMINANTLY MONONUCLEAR NO ORGANISMS SEEN    Culture   Final    NO GROWTH < 24 HOURS Performed at [facility], [address] [place], [place] [address]    Report Status PENDING  Incomplete  Aerobic/Anaerobic Culture w Gram Stain (surgical/deep wound)     Status: None (Preliminary result)   Collection Time: 01/19/23  1:49 PM   Specimen: Path fluid; Body Fluid  Result Value Ref Range Status   Specimen Description   Final    FLUID Performed at [facility], [address] [place], [place] [address]    Special Requests   Final    NONE Performed at [facility], [address] [place], [place] [address]    Gram Stain NO WBC SEEN NO ORGANISMS SEEN   Final   Culture   Final    NO GROWTH < 24 HOURS Performed at [facility], [address], [place], [place] [address]    Report Status PENDING  Incomplete  Aerobic/Anaerobic Culture w Gram Stain (surgical/deep wound)     Status: None (Preliminary result)   Collection Time: 01/19/23  1:50 PM   Specimen: Path fluid; Body Fluid  Result Value Ref Range Status   Specimen Description   Final    FLUID Performed at [facility], [address] [place], [place] [address]    Special Requests   Final    NONE Performed at [facility], [address] [place], [place] [address]    Gram Stain   Final    RARE WBC PRESENT, PREDOMINANTLY MONONUCLEAR NO ORGANISMS SEEN    Culture   Final    NO GROWTH < 24 HOURS Performed at [facility], [address] [place], [place] [address]     Report Status PENDING  Incomplete      Radiology Studies last 3 days: DG Pelvis Portable Result Date: 01/19/2023 CLINICAL DATA:  Postop arthroplasty. EXAM: PORTABLE PELVIS 1-2 VIEWS COMPARISON:  CT abdomen pelvis dated 01/16/2023. FINDINGS: No acute fracture or dislocation. The bones are osteopenic. Prior ORIF of the right femur and status post total left hip arthroplasty. A drainage catheter and beads noted over the left hip. A Foley catheter is present. Left hip cutaneous clips. IMPRESSION: Status post total left hip arthroplasty. Electronically Signed   By: Elgie Collard M.D.   On: 01/19/2023 19:18   IR IMAGE GUIDED FLUID DRAIN BY CATHETER Result Date: 01/18/2023 INDICATION: 72 year old gentleman with bacteremia and multiloculated left hip and thigh abscesses presents to interventional radiology for imaging guided drain placement. EXAM: Ultrasound and fluoroscopy guided  left lateral hip abscess drain placement MEDICATIONS: The patient is currently admitted to the hospital and receiving intravenous antibiotics. The antibiotics were administered within an appropriate time frame prior to the initiation of the procedure. ANESTHESIA/SEDATION: None COMPLICATIONS: None immediate. PROCEDURE: Informed written consent was obtained from the patient after a thorough discussion of the procedural risks, benefits and alternatives. All questions were addressed. Maximal Sterile Barrier Technique was utilized including caps, mask, sterile gowns, sterile gloves, sterile drape, hand hygiene and skin antiseptic. A timeout was performed prior to the initiation of the procedure. Ultrasound examination of the left lateral hip region confirmed presence of large fluid collection. The overlying skin was prepped and draped in the usual sterile fashion. Following local administration, the fluid collection was accessed with 19 gauge Yueh needle using continuous ultrasound guidance. The Yueh catheter was removed over 0.035 inch  Amplatz guidewire replaced with 10.2 [place] multipurpose pigtail drain. Limited Dyna CT of the region was performed and confirmed appropriate positioning within the collection. 60 mL of purulent material was aspirated and sent for Gram stain and culture. There did not appear to be significant decrease in size of the left anterior hip fluid collection with aspiration of the lateral hip collection. Therefore decision was made to attempt placement of a drain within this collection. The overlying skin was prepped and draped in usual fashion. Following local administration, this collection was accessed with a 19 gauge Yueh needle utilizing continuous ultrasound guidance. The Yueh catheter was removed over 0.035 inch guidewire. However, I was not able to advance a 10 [place] drain into the collection utilizing fluoroscopic guidance. Hemorrhage was seen along the catheter tract therefore the procedure was abandoned and 15 minutes of manual compression was applied to the site to confirm hemostasis. IMPRESSION: 1. Successful insertion of 10.2 French abscess drain in the left lateral hip collection. 60 mL of purulent material aspirated and sent for Gram stain and culture. 2. Unsuccessful insertion of drain in anterior hip fluid collection. Collection was accessed with 19 gauge Yueh needle, however a drain could not be advanced into the collection. Electronically Signed   By: Acquanetta Belling M.D.   On: 01/18/2023 20:42   MR Lumbar Spine W Wo Contrast Result Date: 01/17/2023 CLINICAL DATA:  Osteomyelitis, lumbar.  Bacteremia. EXAM: MRI LUMBAR SPINE WITHOUT AND WITH CONTRAST TECHNIQUE: Multiplanar and multiecho pulse sequences of the lumbar spine were obtained without and with intravenous contrast. CONTRAST:  8mL GADAVIST GADOBUTROL 1 MMOL/ML IV SOLN COMPARISON:  Abdominal CT from yesterday FINDINGS: Segmentation:  5 lumbar type vertebrae Alignment:  Mild levoscoliosis. Vertebrae: T12 compression fracture with marrow edema.  Height loss is moderate at approximate 50%. Remote and healed L3 superior endplate fracture with mild height loss. No marrow edema to suggest discitis or osteomyelitis. Hemangioma in the L3 body. Conus medullaris and cauda equina: Conus extends to the T12-L1 level. Conus and cauda equina appear normal. Paraspinal and other soft tissues: No perispinal mass or inflammation. Partially covered collection posterior to the left hip. No change from CT yesterday. Disc levels: T12- L1: Unremarkable. L1-L2: Disc height loss and desiccation with bulge. No neural compression L2-L3: Degenerative facet spurring. Circumferential disc bulging. No neural compression L3-L4: Disc bulging with endplate and facet spurring. Mild right subarticular recess narrowing L4-L5: Disc narrowing with bulging and endplate/facet spurring eccentric to the left. L5-S1:Degenerative facet spurring. Left foraminal annular fissure. No neural compression. IMPRESSION: 1. Acute or subacute T12 compression fracture with moderate height loss. Remote, healed L3 compression fracture. 2. No evidence  of discitis or osteomyelitis. 3. Generalized lumbar spine degeneration with scoliosis. Affected levels are described above. Electronically Signed   By: Tiburcio Pea M.D.   On: 01/17/2023 10:19       Sunnie Nielsen, DO [facility] 01/20/2023, 2:29 PM    Dictation software may have been used to generate the above note. Typos may occur and escape review in typed/dictated notes. Please contact Dr Lyn Hollingshead directly for clarity if needed.  Staff may message me via secure chat in Epic  but this may not receive an immediate response,  please page me for urgent matters!  If 7PM-7AM, please contact night coverage [email or web address]

## 2023-01-21 DIAGNOSIS — R6510 Systemic inflammatory response syndrome (SIRS) of non-infectious origin without acute organ dysfunction: Secondary | ICD-10-CM | POA: Diagnosis not present

## 2023-01-21 MED ORDER — ORAL CARE MOUTH RINSE: 15.0000 mL | OROMUCOSAL | Status: DC | PRN

## 2023-01-21 NOTE — Progress Notes (Signed)
PROGRESS NOTE    Timofei Embery   ZOX:[id number] DOB: 11-20-1950  DOA: 01/13/2023 Date of Service: 01/21/23 which is hospital day 8  PCP: Andres Shad, MD    HPI: Kenshawn Lyden is a 72 y.o. male with medical history significant of HTN, HLD, COPD, asthma, GERD, depression, anxiety, schizophrenia, HBV, tobacco abuse, who presents to ED from assisted living facility with AMS, fever and fall.   Hospital course / significant events:  12/17: admitted to hospitalist service, sepsis 12/18: EEG, MRI brain no acute concerns 12/19: BCx (+)Ecoli, ID consulted, plan further imaging to delineate source. Psychiatry consulted for med management 12/20: Afib RVR, resolved. Started low dose beta blocker. CT abd/pelv concern for discitis L3/L4, noted large L hip effusion 12/21: MRI L spine no discitis/osteo 12/22: aspiration L hip effusion w/ concern for purulent fluid, ortho plan for OR tomorrow  12/23: to OR for joint washout L hip  12/24-12/25: stable postop, plan leave drains in place for now, will need long term IV abx so SNF placement is underway   Consultants:  Neurology - informal discussion 12/18 w/ recs EEG and MRI, avoid cefepime  Infectious disease  Psychiatry  Orthopedics   Procedures/Surgeries: 01/18/23: L hip/thigh abscess aspiration w/ drain placement  01/19/23: Left hip and thigh irrigation and debridement with revision head and liner exchange with placement of custom made antibiotic beads       ASSESSMENT & PLAN:    E coli bacteremia No abdominal pain or diarrhea to suggest intra-abdominal pathology though patient unable to provide accurate history. Not retaining urine - PVR is 0. CT abdomen no intra-abdominal infection. Denied hip pain but CT of abdomen incidentally showed large left hip effusion and IR aspiration revealed purulent fluid in the left hip tracking into the thigh. S/P Debridement and head and liner replacement  continue ceftriaxone - will need up to 12  weeks IV abx per ID ID following    Left hip septic arthritis Large effusion, incidental on CT. IR aspiration encountered purulent fluid in the left hip tracking into the [facility] performed wash-out 12/23  Orthopedics following  WBAT LLE Remove drains once no drainage in 12h shift Remove provena and apply honeycomb dressing 7 days after discharge  Follow up in office with [facility] in 2 weeks Lovenox 40 mg SQ daily x 14 days at discharge  Subacute compression fracture On MRI. CT showed possible signs infection but none seen on mri Pain control as needed  Urinary retention Foley out yesterday Void trial  In/out cath as needed  Acute encephalopathy w/ seizure concern, seizures reasonably ruled out  Encephalopathy more likely metabolic d/t sepsis  Report of fall day prior to admission, shaking, and confusion afterward that has now resolved. Discussed w/ neuro, hard to tell what's going on here. They advised mri (nothing acute) and eeg (no seizure-like activity).  clinical monitoring   Frequent falls Likely multifactorial PT consulteded, thinks he is close to his baseline, advising return to ALF though will need to be re-evaluated after surgery   Paroxysmal a-fib With rvr morning of 12/20, resolved. Chads2vasc 1 not anticoagulated, nor is he on a rate/rhythm control agent low-dose bb. Dig is an option but not a great one. Currently tolerating coreg but soft BP.   Hyponatremia Likely hypovolemic as has resolved  with hydration monitor off fluids   Hypokalemia hypomagnesmia replete and monitor  QTC prolongation Likely 2/2 psych meds exacerbated by electrolyte abnormalities, has improved on most recent EKG treat electrolyte abnormalities as above  COPD Quiescent home inhalers   Hypotension Likely multifactorial. Meds likely contributing but needs beta blocker for rate control given recent RVR.  continue midodrine Caution w/ beta blocker rate control     Schizophrenia Ck wnl. Intermittent agitation, none the last few days continue home haldol on monthly invega as well trazodone at bedtime   Chronic pain stable Pain control as needed      DVT prophylaxis: holding Rx PPx perioperatively IV fluids: no continuous IV fluids  Nutrition: regular diet Central lines / invasive devices: drain x2 L hip,  wound vac L hip   Code Status: FULL CODE ACP documentation reviewed:  none on file in [facility]  TOC needs: SNF Barriers to dispo / significant pending items: wound vac, drains, monitoring postop, continuing IV abx, will not be able to go back to ALF on IV abx, SNF placement pending          Subjective / Brief ROS:  Patient reports hip pain but otherwise no concerns at this time. Feels a bit better compares to yesterday   Tolerating diet No CP/SOB  Family Communication:  none at this time      Objective Findings:  Vitals:   01/21/23 0411 01/21/23 0730 01/21/23 0834 01/21/23 1015  BP: 94/69 108/76 98/72 101/63  Pulse: 68 78 65   Resp: 18 19 16    Temp: 98.6 F (37 C) 98.6 F (37 C) 98.1 F (36.7 C)   TempSrc: Oral Oral    SpO2: 94% 94% 95%   Weight:      Height:        Intake/Output Summary (Last 24 hours) at 01/21/2023 1323 Last data filed at 01/21/2023 1307 Gross per 24 hour  Intake 340 ml  Output 2530 ml  Net -2190 ml   Filed Weights   01/13/23 2200 01/19/23 1126  Weight: 78.9 kg 78.9 kg    Examination:  Physical Exam Constitutional:      General: He is not in acute distress. Cardiovascular:     Rate and Rhythm: Normal rate and regular rhythm.  Pulmonary:     Effort: Pulmonary effort is normal.     Breath sounds: Normal breath sounds.  Musculoskeletal:     Right lower leg: No edema.     Left lower leg: No edema.  Skin:    General: Skin is warm and dry.  Neurological:     General: No focal deficit present.     Mental Status: He is alert. Mental status is at baseline.  Psychiatric:         Mood and Affect: Mood normal.        Behavior: Behavior normal.          Scheduled Medications:   benztropine  1 mg Oral Daily   carvedilol  3.125 mg Oral BID WC   docusate sodium  100 mg Oral BID   enoxaparin (LOVENOX) injection  40 mg Subcutaneous Q24H   feeding supplement  237 mL Oral BID BM   haloperidol  10 mg Oral BID   midodrine  10 mg Oral TID WC   mometasone-formoterol  2 puff Inhalation BID   montelukast  10 mg Oral Daily   multivitamin with minerals  1 tablet Oral Daily   nicotine  21 mg Transdermal Daily   pantoprazole  40 mg Oral Daily   sodium chloride flush  5 mL Intracatheter Q8H   tamsulosin  0.4 mg Oral QPC supper   traZODone  300 mg Oral QHS  Continuous Infusions:  cefTRIAXone (ROCEPHIN)  IV Stopped (01/21/23 1246)    PRN Medications:  acetaminophen, albuterol, dextromethorphan-guaiFENesin, diphenhydrAMINE, HYDROcodone-acetaminophen, hydrOXYzine, LORazepam, magnesium hydroxide, menthol-cetylpyridinium **OR** phenol, metoCLOPramide **OR** metoCLOPramide (REGLAN) injection, morphine injection, mouth rinse  Antimicrobials from admission:  Anti-infectives (From admission, onward)    Start     Dose/Rate Route Frequency Ordered Stop   01/19/23 1403  gentamicin (GARAMYCIN) injection  Status:  Discontinued          As needed 01/19/23 1403 01/19/23 1523   01/19/23 1402  vancomycin (VANCOCIN) powder  Status:  Discontinued          As needed 01/19/23 1402 01/19/23 1523   01/14/23 2300  vancomycin (VANCOREADY) IVPB 1750 mg/350 mL  Status:  Discontinued        1,750 mg 175 mL/hr over 120 Minutes Intravenous Every 24 hours 01/14/23 0352 01/14/23 0840   01/14/23 1400  cefTRIAXone (ROCEPHIN) 2 g in sodium chloride 0.9 % 100 mL IVPB        2 g 200 mL/hr over 30 Minutes Intravenous Daily 01/14/23 1203     01/14/23 0900  metroNIDAZOLE (FLAGYL) IVPB 500 mg  Status:  Discontinued        500 mg 100 mL/hr over 60 Minutes Intravenous  Once 01/14/23 0124 01/14/23 0840    01/14/23 0700  ceFEPIme (MAXIPIME) 2 g in sodium chloride 0.9 % 100 mL IVPB  Status:  Discontinued        2 g 200 mL/hr over 30 Minutes Intravenous Every 8 hours 01/14/23 0349 01/14/23 0840   01/13/23 2215  vancomycin (VANCOREADY) IVPB 1750 mg/350 mL  Status:  Discontinued        1,750 mg 175 mL/hr over 120 Minutes Intravenous  Once 01/13/23 2208 01/13/23 2211   01/13/23 2215  vancomycin (VANCOCIN) IVPB 1000 mg/200 mL premix       Placed in "Followed by" Linked Group   1,000 mg 200 mL/hr over 60 Minutes Intravenous  Once 01/13/23 2211 01/14/23 0021   01/13/23 2215  vancomycin (VANCOREADY) IVPB 750 mg/150 mL       Placed in "Followed by" Linked Group   750 mg 150 mL/hr over 60 Minutes Intravenous  Once 01/13/23 2211 01/14/23 0120   01/13/23 2130  ceFEPIme (MAXIPIME) 2 g in sodium chloride 0.9 % 100 mL IVPB        2 g 200 mL/hr over 30 Minutes Intravenous  Once 01/13/23 2123 01/13/23 2319   01/13/23 2130  metroNIDAZOLE (FLAGYL) IVPB 500 mg        500 mg 100 mL/hr over 60 Minutes Intravenous  Once 01/13/23 2123 01/13/23 2319   01/13/23 2130  vancomycin (VANCOCIN) IVPB 1000 mg/200 mL premix  Status:  Discontinued        1,000 mg 200 mL/hr over 60 Minutes Intravenous  Once 01/13/23 2123 01/13/23 2207           Data Reviewed:  I have personally reviewed the following...  CBC: Recent Labs  Lab 01/19/23 0545 01/20/23 0408  WBC 11.5* 14.0*  HGB 10.4* 10.3*  HCT 30.9* 31.7*  MCV 74.5* 75.8*  PLT 376 406*   Basic Metabolic Panel: Recent Labs  Lab 01/15/23 0447 01/16/23 0518 01/17/23 0514 01/19/23 0545 01/20/23 0408  NA 133* 138 136 135 132*  K 4.1 3.9 3.8 3.9 4.6  CL 104 105 105 104 101  CO2 21* 25 24 21* 22  GLUCOSE 99 115* 106* 110* 133*  BUN 10 12 11 16 20   CREATININE 0.75  0.78 0.69 0.78 0.96  CALCIUM 7.9* 8.0* 8.2* 8.4* 8.6*  MG 2.0  --  1.9  --   --   PHOS 2.5  --  3.6  --   --    GFR: Estimated Creatinine Clearance: 75.2 mL/min (by C-G formula based on  SCr of 0.96 mg/dL). Liver Function Tests: No results for input(s): "AST", "ALT", "ALKPHOS", "BILITOT", "PROT", "ALBUMIN" in the last 168 hours.  No results for input(s): "LIPASE", "AMYLASE" in the last 168 hours. No results for input(s): "AMMONIA" in the last 168 hours. Coagulation Profile: Recent Labs  Lab 01/18/23 0506  INR 1.1   Cardiac Enzymes: No results for input(s): "CKTOTAL", "CKMB", "CKMBINDEX", "TROPONINI" in the last 168 hours.  BNP (last 3 results) No results for input(s): "PROBNP" in the last 8760 hours. HbA1C: No results for input(s): "HGBA1C" in the last 72 hours. CBG: No results for input(s): "GLUCAP" in the last 168 hours. Lipid Profile: No results for input(s): "CHOL", "HDL", "LDLCALC", "TRIG", "CHOLHDL", "LDLDIRECT" in the last 72 hours. Thyroid Function Tests: No results for input(s): "TSH", "T4TOTAL", "FREET4", "T3FREE", "THYROIDAB" in the last 72 hours. Anemia Panel: No results for input(s): "VITAMINB12", "FOLATE", "FERRITIN", "TIBC", "IRON", "RETICCTPCT" in the last 72 hours. Most Recent Urinalysis On File:     Component Value Date/Time   COLORURINE YELLOW (A) 01/13/2023 2130   APPEARANCEUR CLEAR (A) 01/13/2023 2130   LABSPEC 1.010 01/13/2023 2130   PHURINE 6.0 01/13/2023 2130   GLUCOSEU NEGATIVE 01/13/2023 2130   HGBUR SMALL (A) 01/13/2023 2130   BILIRUBINUR NEGATIVE 01/13/2023 2130   KETONESUR NEGATIVE 01/13/2023 2130   PROTEINUR NEGATIVE 01/13/2023 2130   NITRITE NEGATIVE 01/13/2023 2130   LEUKOCYTESUR NEGATIVE 01/13/2023 2130   Sepsis Labs: @LABRCNTIP (procalcitonin:4,lacticidven:4) Microbiology: Recent Results (from the past 240 hours)  Resp panel by RT-PCR (RSV, Flu A&B, Covid) Anterior Nasal Swab     Status: None   Collection Time: 01/13/23  9:29 PM   Specimen: Anterior Nasal Swab  Result Value Ref Range Status   SARS Coronavirus 2 by RT PCR NEGATIVE NEGATIVE Final    Comment: (NOTE) SARS-CoV-2 target nucleic acids are NOT  DETECTED.  The SARS-CoV-2 RNA is generally detectable in upper respiratory specimens during the acute phase of infection. The lowest concentration of SARS-CoV-2 viral copies this assay can detect is 138 copies/mL. A negative result does not preclude SARS-Cov-2 infection and should not be used as the sole basis for treatment or other patient management decisions. A negative result may occur with  improper specimen collection/handling, submission of specimen other than nasopharyngeal swab, presence of viral mutation(s) within the areas targeted by this assay, and inadequate number of viral copies(<138 copies/mL). A negative result must be combined with clinical observations, patient history, and epidemiological information. The expected result is Negative.  Fact Sheet for Patients:  BloggerCourse.com  Fact Sheet for Healthcare Providers:  SeriousBroker.it  This test is no t yet approved or cleared by the [place] FDA and  has been authorized for detection and/or diagnosis of SARS-CoV-2 by FDA under an Emergency Use Authorization (EUA). This EUA will remain  in effect (meaning this test can be used) for the duration of the COVID-19 declaration under Section 564(b)(1) of the Act, 21 U.S.C.section 360bbb-3(b)(1), unless the authorization is terminated  or revoked sooner.       Influenza A by PCR NEGATIVE NEGATIVE Final   Influenza B by PCR NEGATIVE NEGATIVE Final    Comment: (NOTE) The Xpert Xpress SARS-CoV-2/FLU/RSV plus assay is intended as an  aid in the diagnosis of influenza from Nasopharyngeal swab specimens and should not be used as a sole basis for treatment. Nasal washings and aspirates are unacceptable for Xpert Xpress SARS-CoV-2/FLU/RSV testing.  Fact Sheet for Patients: BloggerCourse.com  Fact Sheet for Healthcare Providers: SeriousBroker.it  This test is not yet  approved or cleared by the [place] FDA and has been authorized for detection and/or diagnosis of SARS-CoV-2 by FDA under an Emergency Use Authorization (EUA). This EUA will remain in effect (meaning this test can be used) for the duration of the COVID-19 declaration under Section 564(b)(1) of the Act, 21 U.S.C. section 360bbb-3(b)(1), unless the authorization is terminated or revoked.     Resp Syncytial Virus by PCR NEGATIVE NEGATIVE Final    Comment: (NOTE) Fact Sheet for Patients: BloggerCourse.com  Fact Sheet for Healthcare Providers: SeriousBroker.it  This test is not yet approved or cleared by the [place] FDA and has been authorized for detection and/or diagnosis of SARS-CoV-2 by FDA under an Emergency Use Authorization (EUA). This EUA will remain in effect (meaning this test can be used) for the duration of the COVID-19 declaration under Section 564(b)(1) of the Act, 21 U.S.C. section 360bbb-3(b)(1), unless the authorization is terminated or revoked.  Performed at [facility], [address] [place], [place] [address]   Blood Culture (routine x 2)     Status: Abnormal   Collection Time: 01/13/23  9:29 PM   Specimen: BLOOD  Result Value Ref Range Status   Specimen Description   Final    BLOOD BLOOD LEFT ARM Performed at [facility], [address] [place], [place] [address]    Special Requests   Final    BOTTLES DRAWN AEROBIC AND ANAEROBIC Blood Culture adequate volume Performed at [facility], [address] [place], [place] [address]    Culture  Setup Time   Final    Organism ID to follow ANAEROBIC BOTTLE ONLY GRAM NEGATIVE RODS CRITICAL RESULT CALLED TO, READ BACK BY AND VERIFIED WITH: TREY GREENWOOD 01/14/23 1142 KLW Performed at [facility], [address] [place], [place] [address]    Culture ESCHERICHIA COLI (A)  Final   Report Status 01/16/2023  FINAL  Final   Organism ID, Bacteria ESCHERICHIA COLI  Final   Organism ID, Bacteria ESCHERICHIA COLI  Final      Susceptibility   Escherichia coli - KIRBY BAUER*    CEFAZOLIN INTERMEDIATE Intermediate    Escherichia coli - MIC*    AMPICILLIN >=32 RESISTANT Resistant     CEFEPIME <=0.12 SENSITIVE Sensitive     CEFTAZIDIME <=1 SENSITIVE Sensitive     CEFTRIAXONE <=0.25 SENSITIVE Sensitive     CIPROFLOXACIN <=0.25 SENSITIVE Sensitive     GENTAMICIN <=1 SENSITIVE Sensitive     IMIPENEM <=0.25 SENSITIVE Sensitive     TRIMETH/SULFA <=20 SENSITIVE Sensitive     AMPICILLIN/SULBACTAM 16 INTERMEDIATE Intermediate     PIP/TAZO <=4 SENSITIVE Sensitive ug/mL    * ESCHERICHIA COLI    ESCHERICHIA COLI  Urine Culture     Status: None   Collection Time: 01/13/23  9:29 PM   Specimen: Urine, Clean Catch  Result Value Ref Range Status   Specimen Description   Final    URINE, CLEAN CATCH Performed at [facility], [address], [place], [place] [address]    Special Requests   Final    NONE Performed at [facility], [address] [place], [place] [address]    Culture  Final    NO GROWTH Performed at [facility], [address] [place], [place] [address]    Report Status 01/15/2023 FINAL  Final  Blood Culture ID Panel (Reflexed)     Status: Abnormal   Collection Time: 01/13/23  9:29 PM  Result Value Ref Range Status   Enterococcus faecalis NOT DETECTED NOT DETECTED Final   Enterococcus Faecium NOT DETECTED NOT DETECTED Final   Listeria monocytogenes NOT DETECTED NOT DETECTED Final   Staphylococcus species NOT DETECTED NOT DETECTED Final   Staphylococcus aureus (BCID) NOT DETECTED NOT DETECTED Final   Staphylococcus epidermidis NOT DETECTED NOT DETECTED Final   Staphylococcus lugdunensis NOT DETECTED NOT DETECTED Final   Streptococcus species NOT DETECTED NOT DETECTED Final   Streptococcus agalactiae NOT DETECTED NOT DETECTED Final   Streptococcus pneumoniae  NOT DETECTED NOT DETECTED Final   Streptococcus pyogenes NOT DETECTED NOT DETECTED Final   A.calcoaceticus-baumannii NOT DETECTED NOT DETECTED Final   Bacteroides fragilis NOT DETECTED NOT DETECTED Final   Enterobacterales DETECTED (A) NOT DETECTED Final    Comment: Enterobacterales represent a large order of gram negative bacteria, not a single organism. CRITICAL RESULT CALLED TO, READ BACK BY AND VERIFIED WITH: TREY GREENWOOD 01/14/23 1142 KLW    Enterobacter cloacae complex NOT DETECTED NOT DETECTED Final   Escherichia coli DETECTED (A) NOT DETECTED Final    Comment: CRITICAL RESULT CALLED TO, READ BACK BY AND VERIFIED WITH: TREY GREENWOOD 01/14/23 1142 KLW    Klebsiella aerogenes NOT DETECTED NOT DETECTED Final   Klebsiella oxytoca NOT DETECTED NOT DETECTED Final   Klebsiella pneumoniae NOT DETECTED NOT DETECTED Final   Proteus species NOT DETECTED NOT DETECTED Final   Salmonella species NOT DETECTED NOT DETECTED Final   Serratia marcescens NOT DETECTED NOT DETECTED Final   Haemophilus influenzae NOT DETECTED NOT DETECTED Final   Neisseria meningitidis NOT DETECTED NOT DETECTED Final   Pseudomonas aeruginosa NOT DETECTED NOT DETECTED Final   Stenotrophomonas maltophilia NOT DETECTED NOT DETECTED Final   Candida albicans NOT DETECTED NOT DETECTED Final   Candida auris NOT DETECTED NOT DETECTED Final   Candida glabrata NOT DETECTED NOT DETECTED Final   Candida krusei NOT DETECTED NOT DETECTED Final   Candida parapsilosis NOT DETECTED NOT DETECTED Final   Candida tropicalis NOT DETECTED NOT DETECTED Final   Cryptococcus neoformans/gattii NOT DETECTED NOT DETECTED Final   CTX-M ESBL NOT DETECTED NOT DETECTED Final   Carbapenem resistance IMP NOT DETECTED NOT DETECTED Final   Carbapenem resistance KPC NOT DETECTED NOT DETECTED Final   Carbapenem resistance NDM NOT DETECTED NOT DETECTED Final   Carbapenem resist OXA 48 LIKE NOT DETECTED NOT DETECTED Final   Carbapenem resistance VIM  NOT DETECTED NOT DETECTED Final    Comment: Performed at [facility], [address] [place], [place] [address]  Blood Culture (routine x 2)     Status: None   Collection Time: 01/13/23 11:06 PM   Specimen: BLOOD  Result Value Ref Range Status   Specimen Description BLOOD RIGHT ARM  Final   Special Requests   Final    BOTTLES DRAWN AEROBIC AND ANAEROBIC Blood Culture adequate volume   Culture   Final    NO GROWTH 5 DAYS Performed at [facility], [address] [place], [place] [address]    Report Status 01/19/2023 FINAL  Final  Respiratory (~20 pathogens) panel by PCR     Status: None   Collection Time: 01/14/23 12:15 PM   Specimen: Nasopharyngeal Swab; Respiratory  Result Value Ref Range Status   Adenovirus NOT DETECTED NOT DETECTED Final   Coronavirus 229E NOT DETECTED NOT DETECTED Final    Comment: (NOTE) The Coronavirus on the Respiratory Panel, DOES NOT test for the novel  Coronavirus (2019 nCoV)    Coronavirus HKU1 NOT DETECTED NOT DETECTED Final   Coronavirus NL63 NOT DETECTED NOT DETECTED Final   Coronavirus OC43 NOT DETECTED NOT DETECTED Final   Metapneumovirus NOT DETECTED NOT DETECTED Final   Rhinovirus / Enterovirus NOT DETECTED NOT DETECTED Final   Influenza A NOT DETECTED NOT DETECTED Final   Influenza B NOT DETECTED NOT DETECTED Final   Parainfluenza Virus 1 NOT DETECTED NOT DETECTED Final   Parainfluenza Virus 2 NOT DETECTED NOT DETECTED Final   Parainfluenza Virus 3 NOT DETECTED NOT DETECTED Final   Parainfluenza Virus 4 NOT DETECTED NOT DETECTED Final   Respiratory Syncytial Virus NOT DETECTED NOT DETECTED Final   Bordetella pertussis NOT DETECTED NOT DETECTED Final   Bordetella Parapertussis NOT DETECTED NOT DETECTED Final   Chlamydophila pneumoniae NOT DETECTED NOT DETECTED Final   Mycoplasma pneumoniae NOT DETECTED NOT DETECTED Final    Comment: Performed at [facility], [address] [place], [place] [address]  Culture,  blood (Routine X 2) w Reflex to ID Panel     Status: None   Collection Time: 01/15/23  9:14 AM   Specimen: Left Antecubital; Blood  Result Value Ref Range Status   Specimen Description LEFT ANTECUBITAL  Final   Special Requests   Final    BLOOD BOTTLES DRAWN AEROBIC AND ANAEROBIC Blood Culture adequate volume   Culture   Final    NO GROWTH 5 DAYS Performed at [facility], [address] [place], [place] [address]    Report Status 01/20/2023 FINAL  Final  Culture, blood (Routine X 2) w Reflex to ID Panel     Status: None   Collection Time: 01/15/23  9:15 AM   Specimen: BLOOD RIGHT HAND  Result Value Ref Range Status   Specimen Description BLOOD RIGHT HAND  Final   Special Requests   Final    BLOOD BOTTLES DRAWN AEROBIC AND ANAEROBIC Blood Culture adequate volume   Culture   Final    NO GROWTH 5 DAYS Performed at [facility], [address], [place], [place] [address]    Report Status 01/20/2023 FINAL  Final  Aerobic/Anaerobic Culture w Gram Stain (surgical/deep wound)     Status: None (Preliminary result)   Collection Time: 01/18/23 11:34 AM   Specimen: Drain; Body Fluid  Result Value Ref Range Status   Specimen Description   Final    JP DRAINAGE Performed at [facility], [address] [place], [place] [address]    Special Requests   Final    NONE Performed at [facility], [address] [place], [place] [address]    Gram Stain   Final    ABUNDANT WBC PRESENT, PREDOMINANTLY PMN NO ORGANISMS SEEN    Culture   Final    NO GROWTH 3 DAYS NO ANAEROBES ISOLATED; CULTURE IN PROGRESS FOR 5 DAYS Performed at [facility], [address], [place], [place] [address]    Report Status PENDING  Incomplete  Aerobic/Anaerobic Culture w Gram Stain (surgical/deep wound)     Status: None (Preliminary result)   Collection Time: 01/19/23  1:45 PM   Specimen: Path Tissue  Result Value Ref Range Status   Specimen Description   Final  TISSUE Performed at [facility], [address] [place], [place] [address]    Special Requests LEFT HIP  Final   Gram Stain   Final    RARE WBC PRESENT, PREDOMINANTLY MONONUCLEAR NO ORGANISMS SEEN    Culture   Final    NO GROWTH 2 DAYS NO ANAEROBES ISOLATED; CULTURE IN PROGRESS FOR 5 DAYS Performed at [facility], [address] [place], [place] [address]    Report Status PENDING  Incomplete  Aerobic/Anaerobic Culture w Gram Stain (surgical/deep wound)     Status: None (Preliminary result)   Collection Time: 01/19/23  1:49 PM   Specimen: Path fluid; Body Fluid  Result Value Ref Range Status   Specimen Description   Final    FLUID Performed at [facility], [address], [place], [place] [address]    Special Requests   Final    NONE Performed at [facility], [address] [place], [place] [address]    Gram Stain NO WBC SEEN NO ORGANISMS SEEN   Final   Culture   Final    NO GROWTH 2 DAYS NO ANAEROBES ISOLATED; CULTURE IN PROGRESS FOR 5 DAYS Performed at [facility], [address], [place], [place] [address]    Report Status PENDING  Incomplete  Aerobic/Anaerobic Culture w Gram Stain (surgical/deep wound)     Status: None (Preliminary result)   Collection Time: 01/19/23  1:50 PM   Specimen: Path fluid; Body Fluid  Result Value Ref Range Status   Specimen Description   Final    FLUID Performed at [facility], [address], [place], [place] [address]    Special Requests   Final    NONE Performed at [facility], [address] [place], [place] [address]    Gram Stain   Final    RARE WBC PRESENT, PREDOMINANTLY MONONUCLEAR NO ORGANISMS SEEN    Culture   Final    NO GROWTH 2 DAYS NO ANAEROBES ISOLATED; CULTURE IN PROGRESS FOR 5 DAYS Performed at [facility], [address] [place], [place] [address]    Report Status PENDING  Incomplete      Radiology Studies last 3 days: DG Pelvis  Portable Result Date: 01/19/2023 CLINICAL DATA:  Postop arthroplasty. EXAM: PORTABLE PELVIS 1-2 VIEWS COMPARISON:  CT abdomen pelvis dated 01/16/2023. FINDINGS: No acute fracture or dislocation. The bones are osteopenic. Prior ORIF of the right femur and status post total left hip arthroplasty. A drainage catheter and beads noted over the left hip. A Foley catheter is present. Left hip cutaneous clips. IMPRESSION: Status post total left hip arthroplasty. Electronically Signed   By: Elgie Collard M.D.   On: 01/19/2023 19:18   IR IMAGE GUIDED FLUID DRAIN BY CATHETER Result Date: 01/18/2023 INDICATION: 72 year old gentleman with bacteremia and multiloculated left hip and thigh abscesses presents to interventional radiology for imaging guided drain placement. EXAM: Ultrasound and fluoroscopy guided left lateral hip abscess drain placement MEDICATIONS: The patient is currently admitted to the hospital and receiving intravenous antibiotics. The antibiotics were administered within an appropriate time frame prior to the initiation of the procedure. ANESTHESIA/SEDATION: None COMPLICATIONS: None immediate. PROCEDURE: Informed written consent was obtained from the patient after a thorough discussion of the procedural risks, benefits and alternatives. All questions were addressed. Maximal Sterile Barrier Technique was utilized including caps, mask, sterile gowns, sterile gloves, sterile drape, hand hygiene and skin antiseptic. A timeout was performed prior to the initiation of  the procedure. Ultrasound examination of the left lateral hip region confirmed presence of large fluid collection. The overlying skin was prepped and draped in the usual sterile fashion. Following local administration, the fluid collection was accessed with 19 gauge Yueh needle using continuous ultrasound guidance. The Yueh catheter was removed over 0.035 inch Amplatz guidewire replaced with 10.2 [place] multipurpose pigtail drain. Limited Dyna  CT of the region was performed and confirmed appropriate positioning within the collection. 60 mL of purulent material was aspirated and sent for Gram stain and culture. There did not appear to be significant decrease in size of the left anterior hip fluid collection with aspiration of the lateral hip collection. Therefore decision was made to attempt placement of a drain within this collection. The overlying skin was prepped and draped in usual fashion. Following local administration, this collection was accessed with a 19 gauge Yueh needle utilizing continuous ultrasound guidance. The Yueh catheter was removed over 0.035 inch guidewire. However, I was not able to advance a 10 [place] drain into the collection utilizing fluoroscopic guidance. Hemorrhage was seen along the catheter tract therefore the procedure was abandoned and 15 minutes of manual compression was applied to the site to confirm hemostasis. IMPRESSION: 1. Successful insertion of 10.2 French abscess drain in the left lateral hip collection. 60 mL of purulent material aspirated and sent for Gram stain and culture. 2. Unsuccessful insertion of drain in anterior hip fluid collection. Collection was accessed with 19 gauge Yueh needle, however a drain could not be advanced into the collection. Electronically Signed   By: Acquanetta Belling M.D.   On: 01/18/2023 20:42       Sunnie Nielsen, DO [facility] 01/21/2023, 1:23 PM    Dictation software may have been used to generate the above note. Typos may occur and escape review in typed/dictated notes. Please contact Dr Lyn Hollingshead directly for clarity if needed.  Staff may message me via secure chat in Epic  but this may not receive an immediate response,  please page me for urgent matters!  If 7PM-7AM, please contact night coverage [email or web address]

## 2023-01-21 NOTE — Progress Notes (Signed)
Subjective: 2 Days Post-Op Procedure(s) (LRB): HIP REVISION, HEAD AND LINER EXCHANGE (Left) IRRIGATION AND DEBRIDEMENT THIGH (Left) Patient reports pain as mild.  Pain much improved since surgery. Patient is well, and has had no acute complaints or problems Denies any CP, SOB, ABD pain. No N/V. States he has been able to void his bladder, no BM yet We will continue therapy today. States he was able to stand with PT yesterday and thinks it went pretty well.    Objective: Vital signs in last 24 hours: Temp:  [97.8 F (36.6 C)-98.6 F (37 C)] 98.1 F (36.7 C) (12/25 0834) Pulse Rate:  [65-78] 65 (12/25 0834) Resp:  [16-19] 16 (12/25 0834) BP: (83-108)/(54-76) 101/63 (12/25 1015) SpO2:  [94 %-95 %] 95 % (12/25 0834)  Intake/Output from previous day: 12/24 0701 - 12/25 0700 In: -  Out: 2180 [Urine:2150; Drains:30] Intake/Output this shift: Total I/O In: 240 [P.O.:240] Out: 350 [Urine:350]  Recent Labs    01/19/23 0545 01/20/23 0408  HGB 10.4* 10.3*   Recent Labs    01/19/23 0545 01/20/23 0408  WBC 11.5* 14.0*  RBC 4.15* 4.18*  HCT 30.9* 31.7*  PLT 376 406*   Recent Labs    01/19/23 0545 01/20/23 0408  NA 135 132*  K 3.9 4.6  CL 104 101  CO2 21* 22  BUN 16 20  CREATININE 0.78 0.96  GLUCOSE 110* 133*  CALCIUM 8.4* 8.6*   No results for input(s): "LABPT", "INR" in the last 72 hours.   EXAM General - Patient is Alert, Appropriate, and Oriented Extremity - Neurovascular intact Sensation intact distally Intact pulses distally Dorsiflexion/Plantar flexion intact No cellulitis present Compartment soft Dressing - dressing C/D/I and no drainage, provena intact with out drainage. 2 drains intact Motor Function - intact, moving foot and toes well on exam.   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)    HLD (hyperlipidemia)    HTN (hypertension)    Schizophrenia (HCC)     Assessment/Plan:   2 Days Post-Op Procedure(s) (LRB): HIP REVISION, HEAD AND LINER EXCHANGE (Left) IRRIGATION AND DEBRIDEMENT THIGH (Left) Principal Problem:   SIRS (systemic inflammatory response syndrome) (HCC) Active Problems:   Hypokalemia   Schizophrenia (HCC)   Chronic obstructive pulmonary disease (HCC)   HTN (hypertension)   AF (paroxysmal atrial fibrillation) (HCC)   Fall at home, initial encounter   Hyponatremia   Prolonged QT interval   Electrolyte disturbance   Hypophosphatemia   Acute metabolic encephalopathy   Left shoulder pain   Hypomagnesemia   Hypotension   E coli bacteremia   Sepsis without acute organ dysfunction (HCC)   Abscess of hip, left   Prosthetic joint infection (HCC)  Estimated body mass index is 24.27 kg/m as calculated from the following:   Height as of this encounter: 5\' 11"  (1.803 m).   Weight as of this encounter: 78.9 kg. Advance diet Up with therapy Pain well controlled Labs show WBC of 14.4, continue to trend with CBC VSS Continue with IV abx per ID Remove drains once no drainage in 12 hr shift, has had minimal output (around 15 ccs into JP drains) today appreciated during todays provider visit, may look to remove tomorrow Remove provena and apply honeycomb dressing 7  days after discharge CM to assist with discharge to SNF  Follow up with [facility] ortho in 2 weeks Lovenox 40 mg SQ daily x 14 days at discharge  DVT Prophylaxis - Lovenox, TED hose, and SCDs Weight-Bearing as tolerated to left leg   Danise Edge, PA-C [facility] 01/21/2023, 10:26 AM

## 2023-01-22 ENCOUNTER — Encounter: Payer: Self-pay | Admitting: Orthopedic Surgery

## 2023-01-22 DIAGNOSIS — T8454XA Infection and inflammatory reaction due to internal left knee prosthesis, initial encounter: Secondary | ICD-10-CM | POA: Diagnosis not present

## 2023-01-22 DIAGNOSIS — B9620 Unspecified Escherichia coli [E. coli] as the cause of diseases classified elsewhere: Secondary | ICD-10-CM | POA: Diagnosis not present

## 2023-01-22 DIAGNOSIS — R7881 Bacteremia: Secondary | ICD-10-CM | POA: Diagnosis not present

## 2023-01-22 DIAGNOSIS — R6510 Systemic inflammatory response syndrome (SIRS) of non-infectious origin without acute organ dysfunction: Secondary | ICD-10-CM | POA: Diagnosis not present

## 2023-01-22 LAB — BASIC METABOLIC PANEL WITH GFR
Anion gap: 9 (ref 5–15)
BUN: 19 mg/dL (ref 8–23)
CO2: 25 mmol/L (ref 22–32)
Calcium: 9.2 mg/dL (ref 8.9–10.3)
Chloride: 97 mmol/L — ABNORMAL LOW (ref 98–111)
Creatinine, Ser: 0.64 mg/dL (ref 0.61–1.24)
GFR, Estimated: >60 mL/min
Glucose, Bld: 111 mg/dL — ABNORMAL HIGH (ref 70–99)
Potassium: 3.8 mmol/L (ref 3.5–5.1)
Sodium: 131 mmol/L — ABNORMAL LOW (ref 135–145)

## 2023-01-22 LAB — CBC
HCT: 32.8 % — ABNORMAL LOW (ref 39.0–52.0)
Hemoglobin: 11 g/dL — ABNORMAL LOW (ref 13.0–17.0)
MCH: 25.9 pg — ABNORMAL LOW (ref 26.0–34.0)
MCHC: 33.5 g/dL (ref 30.0–36.0)
MCV: 77.2 fL — ABNORMAL LOW (ref 80.0–100.0)
Platelets: 417 K/uL — ABNORMAL HIGH (ref 150–400)
RBC: 4.25 MIL/uL (ref 4.22–5.81)
RDW: 20.5 % — ABNORMAL HIGH (ref 11.5–15.5)
WBC: 9.9 K/uL (ref 4.0–10.5)
nRBC: 0 % (ref 0.0–0.2)

## 2023-01-22 LAB — SURGICAL PATHOLOGY

## 2023-01-22 NOTE — Progress Notes (Addendum)
Physical Therapy Treatment Patient Details Name: Adrian Gillespie MRN: [id number] DOB: March 06, 1950 Today's Date: 01/22/2023   History of Present Illness Pt is a 72 y.o. male with PMHx: HTN, HLD, COPD, asthma, GERD, depression, anxiety, schizophrenia, HBV, tobacco abuse, who presents with AMS, fever and fall.  MD assessment includes: encephalopathy, sepsis secondary to ecoli, frequent falls, hyponatremia, and left hip septic arthritis/prosthetic joint infection now s/p left hip and thigh irrigation and debridement with revision head and liner exchange with placement of custom made antibiotic beads on 01/19/23.    PT Comments  Pt was long sitting in bed upon arrival. He is alert but presents with flat affect. Only oriented x 2 but remains cooperative and pleasant throughout. Pt seems to have poor insight of overall situation. He is able to consistently follow commands. Unable to recall hip precautions but is able to adhere to them with vcing/reminders. Pt was able to exit L side of bed. Stand to RW and ambulate around bed to recliner. No LOB however pt has poor overall safety awareness. Once in recliner, pt perform HEP handout without physical assistance.  DC recs remain appropriate. Author will return this afternoon for BID session.    If plan is discharge home, recommend the following: A little help with walking and/or transfers;A little help with bathing/dressing/bathroom;Assistance with cooking/housework;Supervision due to cognitive status;Assist for transportation;Direct supervision/assist for medications management;Help with stairs or ramp for entrance     Equipment Recommendations  Rolling walker (2 wheels);BSC/3in1 (If he does not already have them)       Precautions / Restrictions Precautions Precautions: Fall;Posterior Hip Precaution Booklet Issued: Yes (comment) Precaution Comments: wound vac, hemovac Restrictions Weight Bearing Restrictions Per Provider Order: Yes LLE Weight Bearing Per  Provider Order: Weight bearing as tolerated     Mobility  Bed Mobility Overal bed mobility: Needs Assistance Bed Mobility: Supine to Sit  Supine to sit: Min assist  General bed mobility comments: min assist to safely exit bed. Pt did not recall any precautions but was able to adhere with vcing during all activity    Transfers Overall transfer level: Needs assistance Equipment used: Rolling walker (2 wheels) Transfers: Sit to/from Stand Sit to Stand: Contact guard assist  General transfer comment: CGA for safety. vcs for improved technique and sequenicng only. cognition seems to slow session progression more so than physical deficits    Ambulation/Gait Ambulation/Gait assistance: Contact guard assist Gait Distance (Feet): 12 Feet Assistive device: Rolling walker (2 wheels) Gait Pattern/deviations: Step-to pattern, Decreased step length - right, Decreased stance time - left Gait velocity: decreased General Gait Details: pt was able to ambulate around EOB with RW with antalgic gait. no LOB but pt is  reliant on RW for all dynamic standing activity   Balance Overall balance assessment: Needs assistance Sitting-balance support: Feet supported, Bilateral upper extremity supported Sitting balance-Leahy Scale: Good     Standing balance support: Bilateral upper extremity supported, During functional activity, Reliant on assistive device for balance Standing balance-Leahy Scale: Fair       Cognition Arousal: Alert Behavior During Therapy: Flat affect Overall Cognitive Status: No family/caregiver present to determine baseline cognitive functioning      General Comments: Pt is A and O x 2. poor overall awareness of situation and POC going forward           General Comments General comments (skin integrity, edema, etc.): pt reports living with his sister but unable to recall if she works or is able to provide 24/7 assistance/supervision  Pertinent Vitals/Pain Pain  Assessment Pain Assessment: No/denies pain Pain Score: 0-No pain     PT Goals (current goals can now be found in the care plan section) Acute Rehab PT Goals Patient Stated Goal: none stated Progress towards PT goals: Progressing toward goals    Frequency    BID           Co-evaluation     PT goals addressed during session: Mobility/safety with mobility;Balance;Proper use of DME;Strengthening/ROM        AM-PAC PT "6 Clicks" Mobility   Outcome Measure  Help needed turning from your back to your side while in a flat bed without using bedrails?: A Little Help needed moving from lying on your back to sitting on the side of a flat bed without using bedrails?: A Little Help needed moving to and from a bed to a chair (including a wheelchair)?: A Little Help needed standing up from a chair using your arms (e.g., wheelchair or bedside chair)?: A Little Help needed to walk in hospital room?: A Little Help needed climbing 3-5 steps with a railing? : A Lot 6 Click Score: 17    End of Session   Activity Tolerance: Patient tolerated treatment well;Patient limited by lethargy Patient left: in chair;with call bell/phone within reach;with chair alarm set;Other (comment) Nurse Communication: Mobility status;Precautions PT Visit Diagnosis: Muscle weakness (generalized) (M62.81);Other abnormalities of gait and mobility (R26.89)     Time: 3664-4034 PT Time Calculation (min) (ACUTE ONLY): 14 min  Charges:    $Therapeutic Activity: 8-22 mins PT General Charges $$ ACUTE PT VISIT: 1 Visit                     Jetta Lout PTA 01/22/23, 11:25 AM

## 2023-01-22 NOTE — Progress Notes (Signed)
Referring Physician(s): Dr. Ashok Pall  Supervising Physician: Oley Balm  Patient Status:  [facility] - In-pt  Chief Complaint: Left hip/thigh abscess s/p drain placement in IR 01/18/23  Subjective: Patient resting comfortably in bed watching TV. He denies pain or discomfort. He states his left hip does not hurt.   Allergies: Shellfish-derived products  Medications: Prior to Admission medications   Medication Sig Start Date End Date Taking? Authorizing Provider  acetaminophen (TYLENOL) 325 MG tablet Take 2 tablets (650 mg total) by mouth every 6 (six) hours as needed for mild pain or moderate pain. 02/07/20  Yes Wieting, Gerlene Burdock, MD  AD[facility] [phone] MCG/ACT inhaler Inhale 2 puffs into the lungs 2 (two) times daily. 01/10/20  Yes [provider]  albuterol (VENTOLIN HFA) 108 (90 Base) MCG/ACT inhaler Inhale 2 puffs into the lungs every 6 (six) hours as needed for shortness of breath or wheezing. 04/05/19  Yes Dhungel, Nishant, MD  benztropine (COGENTIN) 1 MG tablet Take 1 mg by mouth daily.   Yes [provider]  BEVESPI AEROSPHERE 9-4.8 MCG/ACT AERO Inhale 2 puffs into the lungs at bedtime.  03/31/19  Yes [provider]  haloperidol (HALDOL) 10 MG tablet Take 10 mg by mouth 2 (two) times daily.   Yes [provider]  HYDROcodone-acetaminophen (NORCO/VICODIN) 5-325 MG tablet Take 1-2 tablets by mouth every 4 (four) hours as needed for moderate pain (pain score 4-6). 09/20/22  Yes Dedra Skeens, PA-C  hydrOXYzine (VISTARIL) 50 MG capsule Take 50 mg by mouth 2 (two) times daily as needed for anxiety.  04/14/19  Yes [provider]  INVEGA SUSTENNA 234 MG/1.5ML SUSY injection Inject 234 mg into the muscle every 30 (thirty) days. 04/18/19  Yes [provider]  Multiple Vitamin (MULTIVITAMIN WITH MINERALS) TABS tablet Take 1 tablet by mouth daily. 09/24/22  Yes Arnetha Courser, MD  naloxone [facility]) nasal spray 4 mg/0.1 mL One spray in nostril for opioid  reversal if unresponsive 02/07/20  Yes Wieting, Richard, MD  nicotine (NICODERM CQ - DOSED IN MG/24 HOURS) 21 mg/24hr patch Place 1 patch (21 mg total) onto the skin daily. 05/26/19  Yes Dhungel, Nishant, MD  omeprazole (PRILOSEC) 20 MG capsule Take 20 mg by mouth daily.   Yes [provider]  tamsulosin (FLOMAX) 0.4 MG CAPS capsule Take 1 capsule (0.4 mg total) by mouth daily after supper. 09/23/22  Yes Arnetha Courser, MD  tiotropium (SPIRIVA HANDIHALER) 18 MCG inhalation capsule Place 1 capsule (18 mcg total) into inhaler and inhale daily. 04/05/19  Yes Dhungel, Nishant, MD  traZODone (DESYREL) 100 MG tablet Take 300 mg by mouth at bedtime.   Yes [provider]  enoxaparin (LOVENOX) 40 MG/0.4ML injection Inject 0.4 mLs (40 mg total) into the skin daily for 14 days. 09/24/22 10/08/22  Arnetha Courser, MD  Fe Fum-Vit C-Vit B12-FA (TRIGELS-F FORTE) CAPS capsule Take 1 capsule by mouth 2 (two) times daily. Patient not taking: Reported on 01/14/2023 09/23/22   Arnetha Courser, MD  feeding supplement, ENSURE ENLIVE, (ENSURE ENLIVE) LIQD Take 237 mLs by mouth 2 (two) times daily between meals. 05/26/19   Dhungel, Theda Belfast, MD  magnesium hydroxide (MILK OF MAGNESIA) 400 MG/5ML suspension Take 30 mLs by mouth daily as needed for mild constipation. Patient not taking: Reported on 01/14/2023 09/23/22   Arnetha Courser, MD  montelukast (SINGULAIR) 10 MG tablet Take 10 mg by mouth daily. Patient not taking: Reported on 01/14/2023 11/25/19   [provider]  polyethylene glycol (MIRALAX / GLYCOLAX) 17  g packet Take 17 g by mouth daily. 02/07/20   Alford Highland, MD     Vital Signs: BP 106/85 (BP Location: Left Arm)   Pulse 72   Temp 98.6 F (37 C) (Oral)   Resp 16   Ht 5\' 11"  (1.803 m)   Wt 174 lb (78.9 kg)   SpO2 95%   BMI 24.27 kg/m   Physical Exam Constitutional:      General: He is not in acute distress.    Appearance: He is not ill-appearing.  Pulmonary:     Effort: Pulmonary  effort is normal.  Musculoskeletal:     Comments: Left thigh JP drain with approximately 15 ml of serosanguinous fluid in bulb. Dressing is clean/dry. Hemovac surgical drain in place.   Skin:    General: Skin is warm and dry.  Neurological:     Mental Status: He is alert and oriented to person, place, and time.     Imaging: DG Pelvis Portable Result Date: 01/19/2023 CLINICAL DATA:  Postop arthroplasty. EXAM: PORTABLE PELVIS 1-2 VIEWS COMPARISON:  CT abdomen pelvis dated 01/16/2023. FINDINGS: No acute fracture or dislocation. The bones are osteopenic. Prior ORIF of the right femur and status post total left hip arthroplasty. A drainage catheter and beads noted over the left hip. A Foley catheter is present. Left hip cutaneous clips. IMPRESSION: Status post total left hip arthroplasty. Electronically Signed   By: Elgie Collard M.D.   On: 01/19/2023 19:18   IR IMAGE GUIDED FLUID DRAIN BY CATHETER Result Date: 01/18/2023 INDICATION: 72 year old gentleman with bacteremia and multiloculated left hip and thigh abscesses presents to interventional radiology for imaging guided drain placement. EXAM: Ultrasound and fluoroscopy guided left lateral hip abscess drain placement MEDICATIONS: The patient is currently admitted to the hospital and receiving intravenous antibiotics. The antibiotics were administered within an appropriate time frame prior to the initiation of the procedure. ANESTHESIA/SEDATION: None COMPLICATIONS: None immediate. PROCEDURE: Informed written consent was obtained from the patient after a thorough discussion of the procedural risks, benefits and alternatives. All questions were addressed. Maximal Sterile Barrier Technique was utilized including caps, mask, sterile gowns, sterile gloves, sterile drape, hand hygiene and skin antiseptic. A timeout was performed prior to the initiation of the procedure. Ultrasound examination of the left lateral hip region confirmed presence of large fluid  collection. The overlying skin was prepped and draped in the usual sterile fashion. Following local administration, the fluid collection was accessed with 19 gauge Yueh needle using continuous ultrasound guidance. The Yueh catheter was removed over 0.035 inch Amplatz guidewire replaced with 10.2 [place] multipurpose pigtail drain. Limited Dyna CT of the region was performed and confirmed appropriate positioning within the collection. 60 mL of purulent material was aspirated and sent for Gram stain and culture. There did not appear to be significant decrease in size of the left anterior hip fluid collection with aspiration of the lateral hip collection. Therefore decision was made to attempt placement of a drain within this collection. The overlying skin was prepped and draped in usual fashion. Following local administration, this collection was accessed with a 19 gauge Yueh needle utilizing continuous ultrasound guidance. The Yueh catheter was removed over 0.035 inch guidewire. However, I was not able to advance a 10 [place] drain into the collection utilizing fluoroscopic guidance. Hemorrhage was seen along the catheter tract therefore the procedure was abandoned and 15 minutes of manual compression was applied to the site to confirm hemostasis. IMPRESSION: 1. Successful insertion of 10.2 French abscess drain in  the left lateral hip collection. 60 mL of purulent material aspirated and sent for Gram stain and culture. 2. Unsuccessful insertion of drain in anterior hip fluid collection. Collection was accessed with 19 gauge Yueh needle, however a drain could not be advanced into the collection. Electronically Signed   By: Acquanetta Belling M.D.   On: 01/18/2023 20:42    Labs:  CBC: Recent Labs    01/14/23 0657 01/19/23 0545 01/20/23 0408 01/22/23 0506  WBC 12.9* 11.5* 14.0* 9.9  HGB 10.5* 10.4* 10.3* 11.0*  HCT 31.7* 30.9* 31.7* 32.8*  PLT 263 376 406* 417*    COAGS: Recent Labs    01/13/23 2129  01/18/23 0506  INR 1.3* 1.1  APTT 38*  --     BMP: Recent Labs    01/17/23 0514 01/19/23 0545 01/20/23 0408 01/22/23 0506  NA 136 135 132* 131*  K 3.8 3.9 4.6 3.8  CL 105 104 101 97*  CO2 24 21* 22 25  GLUCOSE 106* 110* 133* 111*  BUN 11 16 20 19   CALCIUM 8.2* 8.4* 8.6* 9.2  CREATININE 0.69 0.78 0.96 0.64  GFRNONAA >60 >60 >60 >60    LIVER FUNCTION TESTS: Recent Labs    01/13/23 2129  BILITOT 1.4*  AST 22  ALT 18  ALKPHOS 87  PROT 6.3*  ALBUMIN 2.4*    Assessment and Plan:  Left hip/thigh abscess s/p drain placement in IR 01/18/23  Patient is afebrile and WBC is within normal limits. Approximately 15 ml of serosanguineous fluid in bulb. Cultures were negative.   Drain Location: Left Hip  Size: Fr size: 10 Fr Date of placement: 01/18/23  Currently to: Drain collection device: suction bulb 24 hour output:  Output by Drain (mL) 01/20/23 0700 - 01/20/23 1459 01/20/23 1500 - 01/20/23 2259 01/20/23 2300 - 01/21/23 0659 01/21/23 0700 - 01/21/23 1459 01/21/23 1500 - 01/21/23 2259 01/21/23 2300 - 01/22/23 0659 01/22/23 0700 - 01/22/23 0905  Closed System Drain 1 Left Hip Accordion (Hemovac)   15 0 45    Closed System Drain 2 Left Hip Bulb (JP)   15 10 30     Negative Pressure Wound Therapy Hip Left   0 0 0      Interval imaging/drain manipulation:  None  Current examination: Insertion site unremarkable. Dressed appropriately.   Plan: Record output Q shift. Dressing changes QD or PRN if soiled.  Call IR APP or on call IR MD with drain-related issues.   Repeat imaging/possible drain injection once output < 10 mL/QD. Consideration for drain removal if output is < 10 mL/QD, pending discussion with the providing surgical service.  Discharge planning: Please contact IR APP or on call IR MD prior to patient d/c to ensure appropriate follow up plans are in place. Typically patient will follow up with IR clinic 10-14 days post d/c for repeat imaging/possible drain  injection. IR scheduler will contact patient with date/time of appointment. Patient will need to record output QD, dressing changes every 2-3 days or earlier if soiled.   IR will continue to follow - please call with questions or concerns.  Electronically Signed: Alwyn Ren, AGACNP-BC [phone] 01/22/2023, 9:04 AM   I spent a total of 15 Minutes at the the patient's bedside AND on the patient's hospital floor or unit, greater than 50% of which was counseling/coordinating care for left thigh drain.

## 2023-01-22 NOTE — Progress Notes (Signed)
Subjective: 3 Days Post-Op Procedure(s) (LRB): HIP REVISION, HEAD AND LINER EXCHANGE (Left) IRRIGATION AND DEBRIDEMENT THIGH (Left) Patient reports pain as mild to the left hip.  Denies any issues this morning. Patient is well, and has had no acute complaints or problems Denies any CP, SOB, ABD pain. No N/V. States he has been able to void his bladder, and he has had a BM at this time. We will continue therapy today.   Objective: Vital signs in last 24 hours: Temp:  [97.6 F (36.4 C)-98.1 F (36.7 C)] 97.6 F (36.4 C) (12/26 0503) Pulse Rate:  [65-86] 73 (12/26 0503) Resp:  [16-20] 20 (12/26 0503) BP: (98-116)/(63-76) 104/69 (12/26 0503) SpO2:  [94 %-95 %] 94 % (12/26 0503)  Intake/Output from previous day: 12/25 0701 - 12/26 0700 In: 710 [P.O.:600; IV Piggyback:100] Out: 1386 [Urine:1300; Drains:85; Stool:1] Intake/Output this shift: No intake/output data recorded.  Recent Labs    01/20/23 0408 01/22/23 0506  HGB 10.3* 11.0*   Recent Labs    01/20/23 0408 01/22/23 0506  WBC 14.0* 9.9  RBC 4.18* 4.25  HCT 31.7* 32.8*  PLT 406* 417*   Recent Labs    01/20/23 0408 01/22/23 0506  NA 132* 131*  K 4.6 3.8  CL 101 97*  CO2 22 25  BUN 20 19  CREATININE 0.96 0.64  GLUCOSE 133* 111*  CALCIUM 8.6* 9.2   No results for input(s): "LABPT", "INR" in the last 72 hours.   EXAM General - Patient is Alert, Appropriate, and Oriented Extremity - Neurovascular intact Sensation intact distally Intact pulses distally Dorsiflexion/Plantar flexion intact No cellulitis present Compartment soft Dressing -  Dressing intact.  Serosanguinous drainage noted to the middle 4x4 dressing , provena intact with out drainage. 2 drains intact without significant output this morning. Motor Function - intact, moving foot and toes well on exam.   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)    HLD (hyperlipidemia)    HTN (hypertension)    Schizophrenia (HCC)     Assessment/Plan:   3 Days Post-Op Procedure(s) (LRB): HIP REVISION, HEAD AND LINER EXCHANGE (Left) IRRIGATION AND DEBRIDEMENT THIGH (Left) Principal Problem:   SIRS (systemic inflammatory response syndrome) (HCC) Active Problems:   Hypokalemia   Schizophrenia (HCC)   Chronic obstructive pulmonary disease (HCC)   HTN (hypertension)   AF (paroxysmal atrial fibrillation) (HCC)   Fall at home, initial encounter   Hyponatremia   Prolonged QT interval   Electrolyte disturbance   Hypophosphatemia   Acute metabolic encephalopathy   Left shoulder pain   Hypomagnesemia   Hypotension   E coli bacteremia   Sepsis without acute organ dysfunction (HCC)   Abscess of hip, left   Prosthetic joint infection (HCC)  Estimated body mass index is 24.27 kg/m as calculated from the following:   Height as of this encounter: 5\' 11"  (1.803 m).   Weight as of this encounter: 78.9 kg. Advance diet Up with therapy Pain well controlled Labs show WBC of 9.9, trending down from 14.0 VSS Continue with IV abx per ID 85cc of drainaged reported over past 24 hours.  Will leave drains in at this time, will plan on removing tomorrow. Remove provena and apply honeycomb dressing 7 days after discharge CM to  assist with discharge to SNF  Follow up with [facility] in 2 weeks Lovenox 40 mg SQ daily x 14 days at discharge  DVT Prophylaxis - Lovenox, TED hose, and SCDs Weight-Bearing as tolerated to left leg  J. Horris Latino, PA-C [facility] 01/22/2023, 8:03 AM

## 2023-01-22 NOTE — Progress Notes (Signed)
Physical Therapy Treatment Patient Details Name: Adrian Gillespie MRN: [id number] DOB: 07/26/1950 Today's Date: 01/22/2023   History of Present Illness Pt is a 72 y.o. male with PMHx: HTN, HLD, COPD, asthma, GERD, depression, anxiety, schizophrenia, HBV, tobacco abuse, who presents with AMS, fever and fall.  MD assessment includes: encephalopathy, sepsis secondary to ecoli, frequent falls, hyponatremia, and left hip septic arthritis/prosthetic joint infection now s/p left hip and thigh irrigation and debridement with revision head and liner exchange with placement of custom made antibiotic beads on 01/19/23.    PT Comments  Pt was long sitting in recliner, visibly more fatigued than earlier session. Pt requested to get back to bed but was agreeable to attempt further ambulation first. Pt was able to stand to RW and tolerate ambulation to doorway of room. Slow, narrow, antalgic step to gait. Constant vcs to widened Bos and posture correction. Pt does tolerate ambulation to doorway and back but is very fatigued afterwards. Pt was back into bed with +1 mod-max assist. Quickly drifted off to sleep after being repositioned. Acute PT will continue to follow per current POC. DC recs remain appropriate. Acute PT will continue to follow per current POC.    If plan is discharge home, recommend the following: A little help with walking and/or transfers;A little help with bathing/dressing/bathroom;Assistance with cooking/housework;Supervision due to cognitive status;Assist for transportation;Direct supervision/assist for medications management;Help with stairs or ramp for entrance     Equipment Recommendations  Other (comment) (Defer to next level of care)       Precautions / Restrictions Precautions Precautions: Fall;Posterior Hip Precaution Booklet Issued: Yes (comment) Precaution Comments: wound vac, hemovac Restrictions Weight Bearing Restrictions Per Provider Order: Yes LLE Weight Bearing Per Provider  Order: Weight bearing as tolerated     Mobility  Bed Mobility Overal bed mobility: Needs Assistance Bed Mobility: Sit to Supine  Supine to sit: Min assist Sit to supine: Mod assist, Max assist General bed mobility comments: Mod-max assist    Transfers Overall transfer level: Needs assistance Equipment used: Rolling walker (2 wheels) Transfers: Sit to/from Stand Sit to Stand: Contact guard assist, Min assist  General transfer comment: CGA for safety. vcs for improved technique and sequenicng only. cognition seems to slow session progression more so than physical deficits    Ambulation/Gait Ambulation/Gait assistance: Contact guard assist Gait Distance (Feet): 25 Feet Assistive device: Rolling walker (2 wheels) Gait Pattern/deviations: Step-to pattern, Narrow base of support Gait velocity: decreased  General Gait Details: Pt was able to ambulate from recliner to doorway of room and return. Bcs for posture correction and increased /widen BOS. pt very fatigued after minimal gait distances.   Balance Overall balance assessment: Needs assistance Sitting-balance support: Feet supported, Bilateral upper extremity supported Sitting balance-Leahy Scale: Good     Standing balance support: Bilateral upper extremity supported, During functional activity, Reliant on assistive device for balance Standing balance-Leahy Scale: Good     Cognition Arousal: Alert Behavior During Therapy: Flat affect Overall Cognitive Status: No family/caregiver present to determine baseline cognitive functioning    General Comments: Pt is A but visiblyt more fatigued than earlier in the day. He requested to get back to bed.           General Comments General comments (skin integrity, edema, etc.): pt reports living with his sister but unable to recall if she works or is able to provide 24/7 assistance/supervision      Pertinent Vitals/Pain Pain Assessment Pain Assessment: No/denies pain Pain Score:  0-No pain  PT Goals (current goals can now be found in the care plan section) Acute Rehab PT Goals Patient Stated Goal: none stated Progress towards PT goals: Progressing toward goals    Frequency    BID           Co-evaluation     PT goals addressed during session: Mobility/safety with mobility;Balance;Proper use of DME;Strengthening/ROM        AM-PAC PT "6 Clicks" Mobility   Outcome Measure  Help needed turning from your back to your side while in a flat bed without using bedrails?: A Little Help needed moving from lying on your back to sitting on the side of a flat bed without using bedrails?: A Little Help needed moving to and from a bed to a chair (including a wheelchair)?: A Little Help needed standing up from a chair using your arms (e.g., wheelchair or bedside chair)?: A Little Help needed to walk in hospital room?: A Little Help needed climbing 3-5 steps with a railing? : A Lot 6 Click Score: 17    End of Session Equipment Utilized During Treatment: Gait belt Activity Tolerance: Patient tolerated treatment well;Patient limited by fatigue Patient left: in bed;with bed alarm set Nurse Communication: Mobility status;Precautions PT Visit Diagnosis: Muscle weakness (generalized) (M62.81);Other abnormalities of gait and mobility (R26.89)     Time: 3244-0102 PT Time Calculation (min) (ACUTE ONLY): 20 min  Charges:    $Therapeutic Activity: 8-22 mins PT General Charges $$ ACUTE PT VISIT: 1 Visit                     Jetta Lout PTA 01/22/23, 2:29 PM

## 2023-01-22 NOTE — Evaluation (Addendum)
Occupational Therapy Re-Evaluation Patient Details Name: Adrian Gillespie MRN: [id number] DOB: 09-15-1950 Today's Date: 01/22/2023   History of Present Illness Pt is a 72 y.o. male with PMHx: HTN, HLD, COPD, asthma, GERD, depression, anxiety, schizophrenia, HBV, tobacco abuse, who presents with AMS, fever and fall.  MD assessment includes: encephalopathy, sepsis secondary to ecoli, frequent falls, hyponatremia, and left hip septic arthritis/prosthetic joint infection now s/p left hip and thigh irrigation and debridement with revision head and liner exchange with placement of custom made antibiotic beads on 01/19/23.   Clinical Impression   Mr Butner was seen for OT re-evaluation on this date following above I&D. Upon arrival to room pt in bed, agreeable to tx. Pt requires MIN A for bed mobility. MAX A don B socks in sitting. SETUP seated grooming tasks. Recalls 0/3 posterior hip pcns, educated on importance of not crossing legs.  Goals updated to reflect pt progress, will continue to follow POC. Discharge recommendation updated.     If plan is discharge home, recommend the following: A little help with walking and/or transfers;A little help with bathing/dressing/bathroom;Supervision due to cognitive status;Help with stairs or ramp for entrance    Functional Status Assessment  Patient has had a recent decline in their functional status and demonstrates the ability to make significant improvements in function in a reasonable and predictable amount of time.  Equipment Recommendations  Other (comment) (defer)    Recommendations for Other Services       Precautions / Restrictions Precautions Precautions: Fall;Posterior Hip Precaution Booklet Issued: Yes (comment) Precaution Comments: wound vac, hemovac Restrictions Weight Bearing Restrictions Per Provider Order: Yes LLE Weight Bearing Per Provider Order: Weight bearing as tolerated      Mobility Bed Mobility Overal bed mobility: Needs  Assistance Bed Mobility: Supine to Sit, Sit to Supine     Supine to sit: Min assist Sit to supine: Min assist        Transfers Overall transfer level: Needs assistance Equipment used: Rolling walker (2 wheels) Transfers: Sit to/from Stand Sit to Stand: Min assist           General transfer comment: cues for hand placement      Balance Overall balance assessment: Needs assistance Sitting-balance support: Feet supported, Bilateral upper extremity supported Sitting balance-Leahy Scale: Good     Standing balance support: Bilateral upper extremity supported, During functional activity, Reliant on assistive device for balance Standing balance-Leahy Scale: Fair                             ADL either performed or assessed with clinical judgement   ADL Overall ADL's : Needs assistance/impaired                                       General ADL Comments: MAX A don B socks in sitting. SETUP seated grooming tasks      Pertinent Vitals/Pain Pain Assessment Pain Assessment: Faces Faces Pain Scale: Hurts little more Pain Location: L hip Pain Descriptors / Indicators: Aching, Grimacing Pain Intervention(s): Limited activity within patient's tolerance, Repositioned     Extremity/Trunk Assessment Upper Extremity Assessment Upper Extremity Assessment: Generalized weakness   Lower Extremity Assessment Lower Extremity Assessment: Generalized weakness       Communication Communication Communication: No apparent difficulties   Cognition Arousal: Alert Behavior During Therapy: Flat affect Overall Cognitive Status: No family/caregiver present to  determine baseline cognitive functioning                                       General Comments  drains intact pre/post session            Home Living Family/patient expects to be discharged to:: Assisted living                                 Additional Comments:  Per chart review pt is a resident at an ALF      Prior Functioning/Environment Prior Level of Function : Patient poor historian/Family not available                        OT Problem List: Decreased strength;Decreased activity tolerance;Impaired balance (sitting and/or standing);Decreased safety awareness      OT Treatment/Interventions: Self-care/ADL training;Therapeutic exercise;Therapeutic activities;Cognitive remediation/compensation;Patient/family education    OT Goals(Current goals can be found in the care plan section) Acute Rehab OT Goals Patient Stated Goal: to improve pain OT Goal Formulation: With patient Time For Goal Achievement: 02/05/23 Potential to Achieve Goals: Good ADL Goals Pt Will Perform Lower Body Dressing: with min assist;with caregiver independent in assisting;with adaptive equipment;sit to/from stand (no cues for posterior hip pcns) Pt Will Transfer to Toilet: with supervision;ambulating;regular height toilet  OT Frequency: Min 1X/week    Co-evaluation     PT goals addressed during session: Mobility/safety with mobility;Balance;Proper use of DME;Strengthening/ROM        AM-PAC OT "6 Clicks" Daily Activity     Outcome Measure Help from another person eating meals?: None Help from another person taking care of personal grooming?: A Little Help from another person toileting, which includes using toliet, bedpan, or urinal?: A Lot Help from another person bathing (including washing, rinsing, drying)?: A Lot Help from another person to put on and taking off regular upper body clothing?: A Little Help from another person to put on and taking off regular lower body clothing?: A Lot 6 Click Score: 16   End of Session Equipment Utilized During Treatment: Rolling walker (2 wheels)  Activity Tolerance: Patient tolerated treatment well Patient left: in bed;with call bell/phone within reach;with bed alarm set  OT Visit Diagnosis: Unsteadiness on feet  (R26.81);Repeated falls (R29.6);Muscle weakness (generalized) (M62.81)                Time: 1400-1411 OT Time Calculation (min): 11 min Charges:  OT General Charges $OT Visit: 1 Visit OT Evaluation $OT Re-eval: 1 Re-eval  Kathie Dike, M.S. OTR/L  01/22/23, 2:27 PM  ascom [phone]

## 2023-01-22 NOTE — Progress Notes (Addendum)
Date of Admission:  01/13/2023    ID: Adrian Gillespie is a 72 y.o. male  Principal Problem:   SIRS (systemic inflammatory response syndrome) (HCC) Active Problems:   Hypokalemia   Schizophrenia (HCC)   Chronic obstructive pulmonary disease (HCC)   HTN (hypertension)   AF (paroxysmal atrial fibrillation) (HCC)   Fall at home, initial encounter   Hyponatremia   Prolonged QT interval   Electrolyte disturbance   Hypophosphatemia   Acute metabolic encephalopathy   Left shoulder pain   Hypomagnesemia   Hypotension   E coli bacteremia   Sepsis without acute organ dysfunction (HCC)   Abscess of hip, left   Prosthetic joint infection (HCC)    Subjective: Pt says he is okay  Medications:   benztropine  1 mg Oral Daily   carvedilol  3.125 mg Oral BID WC   docusate sodium  100 mg Oral BID   enoxaparin (LOVENOX) injection  40 mg Subcutaneous Q24H   feeding supplement  237 mL Oral BID BM   haloperidol  10 mg Oral BID   midodrine  10 mg Oral TID WC   mometasone-formoterol  2 puff Inhalation BID   montelukast  10 mg Oral Daily   multivitamin with minerals  1 tablet Oral Daily   nicotine  21 mg Transdermal Daily   pantoprazole  40 mg Oral Daily   sodium chloride flush  5 mL Intracatheter Q8H   tamsulosin  0.4 mg Oral QPC supper   traZODone  300 mg Oral QHS    Objective: Vital signs in last 24 hours: Patient Vitals for the past 24 hrs:  BP Temp Temp src Pulse Resp SpO2  01/22/23 2044 116/74 98.3 F (36.8 C) -- 79 16 94 %  01/22/23 1728 102/78 97.6 F (36.4 C) -- 76 16 95 %  01/22/23 0841 106/85 98.6 F (37 C) Oral 72 16 95 %  01/22/23 0503 104/69 97.6 F (36.4 C) Oral 73 20 94 %      PHYSICAL EXAM:  General:awake and alert Lungs: Clear to auscultation bilaterally. No Wheezing or Rhonchi. No rales. Heart: Regular rate and rhythm, no murmur, rub or gallop. Abdomen: Soft, non-tender,not distended. Bowel sounds normal. No masses Extremities:left hip surgical dressing 2  drains Neurologic: Grossly non-focal  Lab Results    Latest Ref Rng & Units 01/22/2023    5:06 AM 01/20/2023    4:08 AM 01/19/2023    5:45 AM  CBC  WBC 4.0 - 10.5 K/uL 9.9  14.0  11.5   Hemoglobin 13.0 - 17.0 g/dL 16.1  09.6  04.5   Hematocrit 39.0 - 52.0 % 32.8  31.7  30.9   Platelets 150 - 400 K/uL 417  406  376        Latest Ref Rng & Units 01/22/2023    5:06 AM 01/20/2023    4:08 AM 01/19/2023    5:45 AM  CMP  Glucose 70 - 99 mg/dL 409  811  914   BUN 8 - 23 mg/dL 19  20  16    Creatinine 0.61 - 1.24 mg/dL 7.82  9.56  2.13   Sodium 135 - 145 mmol/L 131  132  135   Potassium 3.5 - 5.1 mmol/L 3.8  4.6  3.9   Chloride 98 - 111 mmol/L 97  101  104   CO2 22 - 32 mmol/L 25  22  21    Calcium 8.9 - 10.3 mg/dL 9.2  8.6  8.4  Microbiology: Ecoli blood culture 01/13/23 12/19/24BC-NG      Assessment/Plan: Encephalopathy- metabolic resolved   Sepsis secondary to ecoli Has ecoli bacteremia  Left knee  PJI- S/P Debridement and head and liner replacement  continue ceftriaxone- will need up to 6  ( not 12 ) weeks of antibiotic- as it is Gram neg organism 03/04/23 PO Cipro is an option- if no contraindication to use it, and no DDI, but we need to monitor closely for side effects including encephalopathy, , tendinitis, neuropathy, tendon rupture Continue IV ceftriaxone while in hospital to minimize quinolone exposure   MRI no evidence of discitis    Fall- pain left shoulder- xray N   Anemia   Hyponatremia- resolved     COPD   Schizophrenia  Discussed the management with Patient

## 2023-01-22 NOTE — Plan of Care (Signed)
  Problem: Education: Goal: Knowledge of General Education information will improve Description: Including pain rating scale, medication(s)/side effects and non-pharmacologic comfort measures Outcome: Progressing   Problem: Health Behavior/Discharge Planning: Goal: Ability to manage health-related needs will improve Outcome: Progressing   Problem: Clinical Measurements: Goal: Ability to maintain clinical measurements within normal limits will improve Outcome: Progressing Goal: Will remain free from infection Outcome: Progressing Goal: Diagnostic test results will improve Outcome: Progressing Goal: Respiratory complications will improve Outcome: Progressing Goal: Cardiovascular complication will be avoided Outcome: Progressing   Problem: Activity: Goal: Risk for activity intolerance will decrease Outcome: Progressing   Problem: Nutrition: Goal: Adequate nutrition will be maintained Outcome: Progressing   Problem: Coping: Goal: Level of anxiety will decrease Outcome: Progressing   Problem: Elimination: Goal: Will not experience complications related to bowel motility Outcome: Progressing Goal: Will not experience complications related to urinary retention Outcome: Progressing   Problem: Safety: Goal: Ability to remain free from injury will improve Outcome: Progressing   Problem: Skin Integrity: Goal: Risk for impaired skin integrity will decrease Outcome: Progressing   Problem: Education: Goal: Verbalization of understanding the information provided (i.e., activity precautions, restrictions, etc) will improve Outcome: Progressing Goal: Individualized Educational Video(s) Outcome: Progressing   Problem: Self-Concept: Goal: Ability to maintain and perform role responsibilities to the fullest extent possible will improve Outcome: Progressing   Problem: Pain Management: Goal: Pain level will decrease Outcome: Progressing   Problem: Clinical Measurements: Goal:  Postoperative complications will be avoided or minimized Outcome: Progressing

## 2023-01-22 NOTE — Progress Notes (Signed)
PROGRESS NOTE    Adrian Gillespie   GNF:[id number] DOB: [birth year]  DOA: 01/13/2023 Date of Service: 01/22/23 which is hospital day 9  PCP: Andres Shad, MD    HPI: Adrian Gillespie is a 72 y.o. male with medical history significant of HTN, HLD, COPD, asthma, GERD, depression, anxiety, schizophrenia, HBV, tobacco abuse, who presents to ED from assisted living facility with AMS, fever and fall.   Hospital course / significant events:  12/17: admitted to hospitalist service, sepsis 12/18: EEG, MRI brain no acute concerns 12/19: BCx (+)Ecoli, ID consulted, plan further imaging to delineate source. Psychiatry consulted for med management 12/20: Afib RVR, resolved. Started low dose beta blocker. CT abd/pelv concern for discitis L3/L4, noted large L hip effusion 12/21: MRI L spine no discitis/osteo 12/22: aspiration L hip effusion w/ concern for purulent fluid, ortho plan for OR tomorrow  12/23: to OR for joint washout L hip  12/24-12/26: stable postop, leaving drains in place for now will need long term IV abx so SNF placement is underway   Consultants:  Neurology - informal discussion 12/18 w/ recs EEG and MRI, avoid cefepime  Infectious disease  Psychiatry  Orthopedics   Procedures/Surgeries: 01/18/23: L hip/thigh abscess aspiration w/ drain placement  01/19/23: Left hip and thigh irrigation and debridement with revision head and liner exchange with placement of custom made antibiotic beads       ASSESSMENT & PLAN:    E coli bacteremia No abdominal pain or diarrhea to suggest intra-abdominal pathology though patient unable to provide accurate history. Not retaining urine - PVR is 0. CT abdomen no intra-abdominal infection. Denied hip pain but CT of abdomen incidentally showed large left hip effusion and IR aspiration revealed purulent fluid in the left hip tracking into the thigh. S/P Debridement and head and liner replacement  continue ceftriaxone - will need up to 12 weeks  IV abx per ID ID following    Left hip septic arthritis Large effusion, incidental on CT. IR aspiration encountered purulent fluid in the left hip tracking into the [facility] performed wash-out 12/23  Orthopedics following  WBAT LLE Remove drains once no drainage in 12h shift - anticipate tomorrow  Remove provena and apply honeycomb dressing 7 days after discharge  Follow up in office with [facility] in 2 weeks Lovenox 40 mg SQ daily x 14 days at discharge  Subacute compression fracture On MRI. CT showed possible signs infection but none seen on mri Pain control as needed  Urinary retention Foley out yesterday Void trial  In/out cath as needed  Acute encephalopathy w/ seizure concern, seizures reasonably ruled out  Encephalopathy more likely metabolic d/t sepsis  Report of fall day prior to admission, shaking, and confusion afterward that has now resolved. Discussed w/ neuro, hard to tell what's going on here. They advised mri (nothing acute) and eeg (no seizure-like activity).  clinical monitoring   Frequent falls Likely multifactorial PT consulteded, thinks he is close to his baseline, advising return to ALF though will need to be re-evaluated after surgery   Paroxysmal a-fib With rvr morning of 12/20, resolved. Chads2vasc 1 not anticoagulated, nor is he on a rate/rhythm control agent low-dose bb. Dig is an option but not a great one. Currently tolerating coreg but soft BP.   Hyponatremia Likely hypovolemic as has resolved  with hydration Relatively stable off fluids but still low, encourage po hydration  monitor BMP   Hypokalemia hypomagnesmia replete and monitor  QTC prolongation Likely 2/2 psych meds exacerbated by  electrolyte abnormalities, has improved on most recent EKG treat electrolyte abnormalities as above   COPD Quiescent home inhalers   Hypotension Likely multifactorial. Meds likely contributing but needs beta blocker for rate control given recent  RVR.  continue midodrine beta blocker rate control    Schizophrenia Ck wnl. Intermittent agitation, none the last few days continue home haldol on monthly invega as well trazodone at bedtime   Chronic pain stable Pain control as needed      DVT prophylaxis: holding Rx PPx perioperatively IV fluids: no continuous IV fluids  Nutrition: regular diet Central lines / invasive devices: drain x2 L hip,  wound vac L hip   Code Status: FULL CODE ACP documentation reviewed:  none on file in [facility]  TOC needs: SNF Barriers to dispo / significant pending items: wound vac, drains, monitoring postop, continuing IV abx, will not be able to go back to ALF on IV abx, SNF placement pending but may be able to go once drains are out / ortho clearance          Subjective / Brief ROS:  Patient reports hip pain better today otherwise no concerns at this time. Tolerating diet but low intake  No CP/SOB  Family Communication:  none at this time      Objective Findings:  Vitals:   01/21/23 1726 01/21/23 1957 01/22/23 0503 01/22/23 0841  BP: 113/76 116/76 104/69 106/85  Pulse: 86 84 73 72  Resp: 17 16 20 16   Temp: 97.7 F (36.5 C) 97.7 F (36.5 C) 97.6 F (36.4 C) 98.6 F (37 C)  TempSrc: Oral  Oral Oral  SpO2: 95% 95% 94% 95%  Weight:      Height:        Intake/Output Summary (Last 24 hours) at 01/22/2023 1404 Last data filed at 01/22/2023 0500 Gross per 24 hour  Intake --  Output 1026 ml  Net -1026 ml   Filed Weights   01/13/23 2200 01/19/23 1126  Weight: 78.9 kg 78.9 kg    Examination:  Physical Exam Constitutional:      General: He is not in acute distress. Cardiovascular:     Rate and Rhythm: Normal rate and regular rhythm.  Pulmonary:     Effort: Pulmonary effort is normal.     Breath sounds: Normal breath sounds.  Musculoskeletal:     Right lower leg: No edema.     Left lower leg: No edema.  Skin:    General: Skin is warm and dry.  Neurological:      General: No focal deficit present.     Mental Status: He is alert. Mental status is at baseline.  Psychiatric:        Mood and Affect: Mood normal.        Behavior: Behavior normal.          Scheduled Medications:   benztropine  1 mg Oral Daily   carvedilol  3.125 mg Oral BID WC   docusate sodium  100 mg Oral BID   enoxaparin (LOVENOX) injection  40 mg Subcutaneous Q24H   feeding supplement  237 mL Oral BID BM   haloperidol  10 mg Oral BID   midodrine  10 mg Oral TID WC   mometasone-formoterol  2 puff Inhalation BID   montelukast  10 mg Oral Daily   multivitamin with minerals  1 tablet Oral Daily   nicotine  21 mg Transdermal Daily   pantoprazole  40 mg Oral Daily   sodium chloride flush  5 mL Intracatheter Q8H   tamsulosin  0.4 mg Oral QPC supper   traZODone  300 mg Oral QHS    Continuous Infusions:  cefTRIAXone (ROCEPHIN)  IV Stopped (01/21/23 1246)    PRN Medications:  acetaminophen, albuterol, dextromethorphan-guaiFENesin, diphenhydrAMINE, HYDROcodone-acetaminophen, hydrOXYzine, LORazepam, magnesium hydroxide, menthol-cetylpyridinium **OR** phenol, metoCLOPramide **OR** metoCLOPramide (REGLAN) injection, morphine injection, mouth rinse  Antimicrobials from admission:  Anti-infectives (From admission, onward)    Start     Dose/Rate Route Frequency Ordered Stop   01/19/23 1403  gentamicin (GARAMYCIN) injection  Status:  Discontinued          As needed 01/19/23 1403 01/19/23 1523   01/19/23 1402  vancomycin (VANCOCIN) powder  Status:  Discontinued          As needed 01/19/23 1402 01/19/23 1523   01/14/23 2300  vancomycin (VANCOREADY) IVPB 1750 mg/350 mL  Status:  Discontinued        1,750 mg 175 mL/hr over 120 Minutes Intravenous Every 24 hours 01/14/23 0352 01/14/23 0840   01/14/23 1400  cefTRIAXone (ROCEPHIN) 2 g in sodium chloride 0.9 % 100 mL IVPB        2 g 200 mL/hr over 30 Minutes Intravenous Daily 01/14/23 1203     01/14/23 0900  metroNIDAZOLE (FLAGYL)  IVPB 500 mg  Status:  Discontinued        500 mg 100 mL/hr over 60 Minutes Intravenous  Once 01/14/23 0124 01/14/23 0840   01/14/23 0700  ceFEPIme (MAXIPIME) 2 g in sodium chloride 0.9 % 100 mL IVPB  Status:  Discontinued        2 g 200 mL/hr over 30 Minutes Intravenous Every 8 hours 01/14/23 0349 01/14/23 0840   01/13/23 2215  vancomycin (VANCOREADY) IVPB 1750 mg/350 mL  Status:  Discontinued        1,750 mg 175 mL/hr over 120 Minutes Intravenous  Once 01/13/23 2208 01/13/23 2211   01/13/23 2215  vancomycin (VANCOCIN) IVPB 1000 mg/200 mL premix       Placed in "Followed by" Linked Group   1,000 mg 200 mL/hr over 60 Minutes Intravenous  Once 01/13/23 2211 01/14/23 0021   01/13/23 2215  vancomycin (VANCOREADY) IVPB 750 mg/150 mL       Placed in "Followed by" Linked Group   750 mg 150 mL/hr over 60 Minutes Intravenous  Once 01/13/23 2211 01/14/23 0120   01/13/23 2130  ceFEPIme (MAXIPIME) 2 g in sodium chloride 0.9 % 100 mL IVPB        2 g 200 mL/hr over 30 Minutes Intravenous  Once 01/13/23 2123 01/13/23 2319   01/13/23 2130  metroNIDAZOLE (FLAGYL) IVPB 500 mg        500 mg 100 mL/hr over 60 Minutes Intravenous  Once 01/13/23 2123 01/13/23 2319   01/13/23 2130  vancomycin (VANCOCIN) IVPB 1000 mg/200 mL premix  Status:  Discontinued        1,000 mg 200 mL/hr over 60 Minutes Intravenous  Once 01/13/23 2123 01/13/23 2207           Data Reviewed:  I have personally reviewed the following...  CBC: Recent Labs  Lab 01/19/23 0545 01/20/23 0408 01/22/23 0506  WBC 11.5* 14.0* 9.9  HGB 10.4* 10.3* 11.0*  HCT 30.9* 31.7* 32.8*  MCV 74.5* 75.8* 77.2*  PLT 376 406* 417*   Basic Metabolic Panel: Recent Labs  Lab 01/16/23 0518 01/17/23 0514 01/19/23 0545 01/20/23 0408 01/22/23 0506  NA 138 136 135 132* 131*  K 3.9 3.8 3.9 4.6 3.8  CL 105 105 104 101 97*  CO2 25 24 21* 22 25  GLUCOSE 115* 106* 110* 133* 111*  BUN 12 11 16 20 19   CREATININE 0.78 0.69 0.78 0.96 0.64   CALCIUM 8.0* 8.2* 8.4* 8.6* 9.2  MG  --  1.9  --   --   --   PHOS  --  3.6  --   --   --    GFR: Estimated Creatinine Clearance: 90.2 mL/min (by C-G formula based on SCr of 0.64 mg/dL). Liver Function Tests: No results for input(s): "AST", "ALT", "ALKPHOS", "BILITOT", "PROT", "ALBUMIN" in the last 168 hours.  No results for input(s): "LIPASE", "AMYLASE" in the last 168 hours. No results for input(s): "AMMONIA" in the last 168 hours. Coagulation Profile: Recent Labs  Lab 01/18/23 0506  INR 1.1   Cardiac Enzymes: No results for input(s): "CKTOTAL", "CKMB", "CKMBINDEX", "TROPONINI" in the last 168 hours.  BNP (last 3 results) No results for input(s): "PROBNP" in the last 8760 hours. HbA1C: No results for input(s): "HGBA1C" in the last 72 hours. CBG: No results for input(s): "GLUCAP" in the last 168 hours. Lipid Profile: No results for input(s): "CHOL", "HDL", "LDLCALC", "TRIG", "CHOLHDL", "LDLDIRECT" in the last 72 hours. Thyroid Function Tests: No results for input(s): "TSH", "T4TOTAL", "FREET4", "T3FREE", "THYROIDAB" in the last 72 hours. Anemia Panel: No results for input(s): "VITAMINB12", "FOLATE", "FERRITIN", "TIBC", "IRON", "RETICCTPCT" in the last 72 hours. Most Recent Urinalysis On File:     Component Value Date/Time   COLORURINE YELLOW (A) 01/13/2023 2130   APPEARANCEUR CLEAR (A) 01/13/2023 2130   LABSPEC 1.010 01/13/2023 2130   PHURINE 6.0 01/13/2023 2130   GLUCOSEU NEGATIVE 01/13/2023 2130   HGBUR SMALL (A) 01/13/2023 2130   BILIRUBINUR NEGATIVE 01/13/2023 2130   KETONESUR NEGATIVE 01/13/2023 2130   PROTEINUR NEGATIVE 01/13/2023 2130   NITRITE NEGATIVE 01/13/2023 2130   LEUKOCYTESUR NEGATIVE 01/13/2023 2130   Sepsis Labs: @LABRCNTIP (procalcitonin:4,lacticidven:4) Microbiology: Recent Results (from the past 240 hours)  Resp panel by RT-PCR (RSV, Flu A&B, Covid) Anterior Nasal Swab     Status: None   Collection Time: 01/13/23  9:29 PM   Specimen: Anterior  Nasal Swab  Result Value Ref Range Status   SARS Coronavirus 2 by RT PCR NEGATIVE NEGATIVE Final    Comment: (NOTE) SARS-CoV-2 target nucleic acids are NOT DETECTED.  The SARS-CoV-2 RNA is generally detectable in upper respiratory specimens during the acute phase of infection. The lowest concentration of SARS-CoV-2 viral copies this assay can detect is 138 copies/mL. A negative result does not preclude SARS-Cov-2 infection and should not be used as the sole basis for treatment or other patient management decisions. A negative result may occur with  improper specimen collection/handling, submission of specimen other than nasopharyngeal swab, presence of viral mutation(s) within the areas targeted by this assay, and inadequate number of viral copies(<138 copies/mL). A negative result must be combined with clinical observations, patient history, and epidemiological information. The expected result is Negative.  Fact Sheet for Patients:  BloggerCourse.com  Fact Sheet for Healthcare Providers:  SeriousBroker.it  This test is no t yet approved or cleared by the [place] FDA and  has been authorized for detection and/or diagnosis of SARS-CoV-2 by FDA under an Emergency Use Authorization (EUA). This EUA will remain  in effect (meaning this test can be used) for the duration of the COVID-19 declaration under Section 564(b)(1) of the Act, 21 U.S.C.section 360bbb-3(b)(1), unless the authorization is terminated  or revoked sooner.  Influenza A by PCR NEGATIVE NEGATIVE Final   Influenza B by PCR NEGATIVE NEGATIVE Final    Comment: (NOTE) The Xpert Xpress SARS-CoV-2/FLU/RSV plus assay is intended as an aid in the diagnosis of influenza from Nasopharyngeal swab specimens and should not be used as a sole basis for treatment. Nasal washings and aspirates are unacceptable for Xpert Xpress SARS-CoV-2/FLU/RSV testing.  Fact Sheet for  Patients: BloggerCourse.com  Fact Sheet for Healthcare Providers: SeriousBroker.it  This test is not yet approved or cleared by the [place] FDA and has been authorized for detection and/or diagnosis of SARS-CoV-2 by FDA under an Emergency Use Authorization (EUA). This EUA will remain in effect (meaning this test can be used) for the duration of the COVID-19 declaration under Section 564(b)(1) of the Act, 21 U.S.C. section 360bbb-3(b)(1), unless the authorization is terminated or revoked.     Resp Syncytial Virus by PCR NEGATIVE NEGATIVE Final    Comment: (NOTE) Fact Sheet for Patients: BloggerCourse.com  Fact Sheet for Healthcare Providers: SeriousBroker.it  This test is not yet approved or cleared by the [place] FDA and has been authorized for detection and/or diagnosis of SARS-CoV-2 by FDA under an Emergency Use Authorization (EUA). This EUA will remain in effect (meaning this test can be used) for the duration of the COVID-19 declaration under Section 564(b)(1) of the Act, 21 U.S.C. section 360bbb-3(b)(1), unless the authorization is terminated or revoked.  Performed at [facility], [address] [place], [place] [address]   Blood Culture (routine x 2)     Status: Abnormal   Collection Time: 01/13/23  9:29 PM   Specimen: BLOOD  Result Value Ref Range Status   Specimen Description   Final    BLOOD BLOOD LEFT ARM Performed at [facility], [address], [place], [place] [address]    Special Requests   Final    BOTTLES DRAWN AEROBIC AND ANAEROBIC Blood Culture adequate volume Performed at [facility], [address] [place], [place] [address]    Culture  Setup Time   Final    Organism ID to follow ANAEROBIC BOTTLE ONLY GRAM NEGATIVE RODS CRITICAL RESULT CALLED TO, READ BACK BY AND VERIFIED WITH: TREY GREENWOOD 01/14/23  1142 KLW Performed at [facility], [address] [place], [place] [address]    Culture ESCHERICHIA COLI (A)  Final   Report Status 01/16/2023 FINAL  Final   Organism ID, Bacteria ESCHERICHIA COLI  Final   Organism ID, Bacteria ESCHERICHIA COLI  Final      Susceptibility   Escherichia coli - KIRBY BAUER*    CEFAZOLIN INTERMEDIATE Intermediate    Escherichia coli - MIC*    AMPICILLIN >=32 RESISTANT Resistant     CEFEPIME <=0.12 SENSITIVE Sensitive     CEFTAZIDIME <=1 SENSITIVE Sensitive     CEFTRIAXONE <=0.25 SENSITIVE Sensitive     CIPROFLOXACIN <=0.25 SENSITIVE Sensitive     GENTAMICIN <=1 SENSITIVE Sensitive     IMIPENEM <=0.25 SENSITIVE Sensitive     TRIMETH/SULFA <=20 SENSITIVE Sensitive     AMPICILLIN/SULBACTAM 16 INTERMEDIATE Intermediate     PIP/TAZO <=4 SENSITIVE Sensitive ug/mL    * ESCHERICHIA COLI    ESCHERICHIA COLI  Urine Culture     Status: None   Collection Time: 01/13/23  9:29 PM   Specimen: Urine, Clean Catch  Result Value Ref Range Status   Specimen Description   Final    URINE, CLEAN CATCH Performed at [facility], [address], [place], [place]  [address]    Special Requests   Final    NONE Performed at [facility], [address] [place], [place] [address]    Culture   Final    NO GROWTH Performed at [facility], [address] [place], [place] [address]    Report Status 01/15/2023 FINAL  Final  Blood Culture ID Panel (Reflexed)     Status: Abnormal   Collection Time: 01/13/23  9:29 PM  Result Value Ref Range Status   Enterococcus faecalis NOT DETECTED NOT DETECTED Final   Enterococcus Faecium NOT DETECTED NOT DETECTED Final   Listeria monocytogenes NOT DETECTED NOT DETECTED Final   Staphylococcus species NOT DETECTED NOT DETECTED Final   Staphylococcus aureus (BCID) NOT DETECTED NOT DETECTED Final   Staphylococcus epidermidis NOT DETECTED NOT DETECTED Final   Staphylococcus lugdunensis NOT DETECTED NOT  DETECTED Final   Streptococcus species NOT DETECTED NOT DETECTED Final   Streptococcus agalactiae NOT DETECTED NOT DETECTED Final   Streptococcus pneumoniae NOT DETECTED NOT DETECTED Final   Streptococcus pyogenes NOT DETECTED NOT DETECTED Final   A.calcoaceticus-baumannii NOT DETECTED NOT DETECTED Final   Bacteroides fragilis NOT DETECTED NOT DETECTED Final   Enterobacterales DETECTED (A) NOT DETECTED Final    Comment: Enterobacterales represent a large order of gram negative bacteria, not a single organism. CRITICAL RESULT CALLED TO, READ BACK BY AND VERIFIED WITH: TREY GREENWOOD 01/14/23 1142 KLW    Enterobacter cloacae complex NOT DETECTED NOT DETECTED Final   Escherichia coli DETECTED (A) NOT DETECTED Final    Comment: CRITICAL RESULT CALLED TO, READ BACK BY AND VERIFIED WITH: TREY GREENWOOD 01/14/23 1142 KLW    Klebsiella aerogenes NOT DETECTED NOT DETECTED Final   Klebsiella oxytoca NOT DETECTED NOT DETECTED Final   Klebsiella pneumoniae NOT DETECTED NOT DETECTED Final   Proteus species NOT DETECTED NOT DETECTED Final   Salmonella species NOT DETECTED NOT DETECTED Final   Serratia marcescens NOT DETECTED NOT DETECTED Final   Haemophilus influenzae NOT DETECTED NOT DETECTED Final   Neisseria meningitidis NOT DETECTED NOT DETECTED Final   Pseudomonas aeruginosa NOT DETECTED NOT DETECTED Final   Stenotrophomonas maltophilia NOT DETECTED NOT DETECTED Final   Candida albicans NOT DETECTED NOT DETECTED Final   Candida auris NOT DETECTED NOT DETECTED Final   Candida glabrata NOT DETECTED NOT DETECTED Final   Candida krusei NOT DETECTED NOT DETECTED Final   Candida parapsilosis NOT DETECTED NOT DETECTED Final   Candida tropicalis NOT DETECTED NOT DETECTED Final   Cryptococcus neoformans/gattii NOT DETECTED NOT DETECTED Final   CTX-M ESBL NOT DETECTED NOT DETECTED Final   Carbapenem resistance IMP NOT DETECTED NOT DETECTED Final   Carbapenem resistance KPC NOT DETECTED NOT DETECTED  Final   Carbapenem resistance NDM NOT DETECTED NOT DETECTED Final   Carbapenem resist OXA 48 LIKE NOT DETECTED NOT DETECTED Final   Carbapenem resistance VIM NOT DETECTED NOT DETECTED Final    Comment: Performed at [facility], [address] [place], [place] [address]  Blood Culture (routine x 2)     Status: None   Collection Time: 01/13/23 11:06 PM   Specimen: BLOOD  Result Value Ref Range Status   Specimen Description BLOOD RIGHT ARM  Final   Special Requests   Final    BOTTLES DRAWN AEROBIC AND ANAEROBIC Blood Culture adequate volume   Culture   Final    NO GROWTH 5 DAYS Performed at [facility], [address], [place], [place] [address]    Report  Status 01/19/2023 FINAL  Final  Respiratory (~20 pathogens) panel by PCR     Status: None   Collection Time: 01/14/23 12:15 PM   Specimen: Nasopharyngeal Swab; Respiratory  Result Value Ref Range Status   Adenovirus NOT DETECTED NOT DETECTED Final   Coronavirus 229E NOT DETECTED NOT DETECTED Final    Comment: (NOTE) The Coronavirus on the Respiratory Panel, DOES NOT test for the novel  Coronavirus (2019 nCoV)    Coronavirus HKU1 NOT DETECTED NOT DETECTED Final   Coronavirus NL63 NOT DETECTED NOT DETECTED Final   Coronavirus OC43 NOT DETECTED NOT DETECTED Final   Metapneumovirus NOT DETECTED NOT DETECTED Final   Rhinovirus / Enterovirus NOT DETECTED NOT DETECTED Final   Influenza A NOT DETECTED NOT DETECTED Final   Influenza B NOT DETECTED NOT DETECTED Final   Parainfluenza Virus 1 NOT DETECTED NOT DETECTED Final   Parainfluenza Virus 2 NOT DETECTED NOT DETECTED Final   Parainfluenza Virus 3 NOT DETECTED NOT DETECTED Final   Parainfluenza Virus 4 NOT DETECTED NOT DETECTED Final   Respiratory Syncytial Virus NOT DETECTED NOT DETECTED Final   Bordetella pertussis NOT DETECTED NOT DETECTED Final   Bordetella Parapertussis NOT DETECTED NOT DETECTED Final   Chlamydophila pneumoniae NOT DETECTED NOT DETECTED  Final   Mycoplasma pneumoniae NOT DETECTED NOT DETECTED Final    Comment: Performed at [facility], [address] [place], [place] [address]  Culture, blood (Routine X 2) w Reflex to ID Panel     Status: None   Collection Time: 01/15/23  9:14 AM   Specimen: Left Antecubital; Blood  Result Value Ref Range Status   Specimen Description LEFT ANTECUBITAL  Final   Special Requests   Final    BLOOD BOTTLES DRAWN AEROBIC AND ANAEROBIC Blood Culture adequate volume   Culture   Final    NO GROWTH 5 DAYS Performed at [facility], [address] [place], [place] [address]    Report Status 01/20/2023 FINAL  Final  Culture, blood (Routine X 2) w Reflex to ID Panel     Status: None   Collection Time: 01/15/23  9:15 AM   Specimen: BLOOD RIGHT HAND  Result Value Ref Range Status   Specimen Description BLOOD RIGHT HAND  Final   Special Requests   Final    BLOOD BOTTLES DRAWN AEROBIC AND ANAEROBIC Blood Culture adequate volume   Culture   Final    NO GROWTH 5 DAYS Performed at [facility], [address], [place], [place] [address]    Report Status 01/20/2023 FINAL  Final  Aerobic/Anaerobic Culture w Gram Stain (surgical/deep wound)     Status: None (Preliminary result)   Collection Time: 01/18/23 11:34 AM   Specimen: Drain; Body Fluid  Result Value Ref Range Status   Specimen Description   Final    JP DRAINAGE Performed at [facility], [address] [place], [place] [address]    Special Requests   Final    NONE Performed at [facility], [address] [place], [place] [address]    Gram Stain   Final    ABUNDANT WBC PRESENT, PREDOMINANTLY PMN NO ORGANISMS SEEN    Culture   Final    NO GROWTH 4 DAYS NO ANAEROBES ISOLATED; CULTURE IN PROGRESS FOR 5 DAYS Performed at [facility], [address] [place], [place] [address]    Report Status PENDING  Incomplete  Aerobic/Anaerobic Culture w Gram Stain (surgical/deep wound)  Status: None (Preliminary result)   Collection Time: 01/19/23  1:45 PM   Specimen: Path Tissue  Result Value Ref Range Status   Specimen Description   Final    TISSUE Performed at [facility], [address] [place], [place] [address]    Special Requests LEFT HIP  Final   Gram Stain   Final    RARE WBC PRESENT, PREDOMINANTLY MONONUCLEAR NO ORGANISMS SEEN    Culture   Final    NO GROWTH 3 DAYS NO ANAEROBES ISOLATED; CULTURE IN PROGRESS FOR 5 DAYS Performed at [facility], [address], [place], [place] [address]    Report Status PENDING  Incomplete  Aerobic/Anaerobic Culture w Gram Stain (surgical/deep wound)     Status: None (Preliminary result)   Collection Time: 01/19/23  1:49 PM   Specimen: Path fluid; Body Fluid  Result Value Ref Range Status   Specimen Description   Final    FLUID Performed at [facility], [address] [place], [place] [address]    Special Requests   Final    NONE Performed at [facility], [address] [place], [place] [address]    Gram Stain NO WBC SEEN NO ORGANISMS SEEN   Final   Culture   Final    NO GROWTH 3 DAYS NO ANAEROBES ISOLATED; CULTURE IN PROGRESS FOR 5 DAYS Performed at [facility], [address], [place], [place] [address]    Report Status PENDING  Incomplete  Aerobic/Anaerobic Culture w Gram Stain (surgical/deep wound)     Status: None (Preliminary result)   Collection Time: 01/19/23  1:50 PM   Specimen: Path fluid; Body Fluid  Result Value Ref Range Status   Specimen Description   Final    FLUID Performed at [facility], [address] [place], [place] [address]    Special Requests   Final    NONE Performed at [facility], [address] [place], [place] [address]    Gram Stain   Final    RARE WBC PRESENT, PREDOMINANTLY MONONUCLEAR NO ORGANISMS SEEN    Culture   Final    NO GROWTH 3 DAYS NO ANAEROBES ISOLATED; CULTURE IN PROGRESS FOR 5  DAYS Performed at [facility], [address] [place], [place] [address]    Report Status PENDING  Incomplete      Radiology Studies last 3 days: DG Pelvis Portable Result Date: 01/19/2023 CLINICAL DATA:  Postop arthroplasty. EXAM: PORTABLE PELVIS 1-2 VIEWS COMPARISON:  CT abdomen pelvis dated 01/16/2023. FINDINGS: No acute fracture or dislocation. The bones are osteopenic. Prior ORIF of the right femur and status post total left hip arthroplasty. A drainage catheter and beads noted over the left hip. A Foley catheter is present. Left hip cutaneous clips. IMPRESSION: Status post total left hip arthroplasty. Electronically Signed   By: Elgie Collard M.D.   On: 01/19/2023 19:18       Sunnie Nielsen, DO [facility] 01/22/2023, 2:04 PM    Dictation software may have been used to generate the above note. Typos may occur and escape review in typed/dictated notes. Please contact Dr Lyn Hollingshead directly for clarity if needed.  Staff may message me via secure chat in Epic  but this may not receive an immediate response,  please page me for urgent matters!  If 7PM-7AM, please contact night coverage [email or web address]

## 2023-01-23 DIAGNOSIS — T8454XA Infection and inflammatory reaction due to internal left knee prosthesis, initial encounter: Secondary | ICD-10-CM | POA: Diagnosis not present

## 2023-01-23 DIAGNOSIS — R6510 Systemic inflammatory response syndrome (SIRS) of non-infectious origin without acute organ dysfunction: Secondary | ICD-10-CM | POA: Diagnosis not present

## 2023-01-23 DIAGNOSIS — B9620 Unspecified Escherichia coli [E. coli] as the cause of diseases classified elsewhere: Secondary | ICD-10-CM | POA: Diagnosis not present

## 2023-01-23 DIAGNOSIS — R7881 Bacteremia: Secondary | ICD-10-CM | POA: Diagnosis not present

## 2023-01-23 DIAGNOSIS — T8452XA Infection and inflammatory reaction due to internal left hip prosthesis, initial encounter: Secondary | ICD-10-CM

## 2023-01-23 LAB — AEROBIC/ANAEROBIC CULTURE W GRAM STAIN (SURGICAL/DEEP WOUND): Culture: NO GROWTH

## 2023-01-23 MED ORDER — NICOTINE 14 MG/24HR TD PT24
14.0000 mg | MEDICATED_PATCH | Freq: Every day | TRANSDERMAL | Status: DC
  Administered 2023-01-23 – 2023-01-29 (×7): 14 mg via TRANSDERMAL
  Filled 2023-01-23 (×7): qty 1

## 2023-01-23 NOTE — Progress Notes (Cosign Needed Addendum)
Physical Therapy Treatment Patient Details Name: Adrian Gillespie MRN: [id number] DOB: [birth year] Today's Date: 01/23/2023   History of Present Illness Pt is a 72 y.o. male with PMHx: HTN, HLD, COPD, asthma, GERD, depression, anxiety, schizophrenia, HBV, tobacco abuse, who presents with AMS, fever and fall.  MD assessment includes: encephalopathy, sepsis secondary to ecoli, frequent falls, hyponatremia, and left hip septic arthritis/prosthetic joint infection now s/p left hip and thigh irrigation and debridement with revision head and liner exchange with placement of custom made antibiotic beads on 01/19/23.    PT Comments  Pt was long sitting in bed upon arrival. He is alert but continues to present with flat affect. Pt is agreeable to session and cooperative throughout. He still requires assistance + vcs for adhering to hip precautions. Pt was able to exit L side of bed, stand to RW, and ambulate ~ 40 ft total (Out to RN desk> back). Pt was cued for posture correction and to widen BOS for improved safety. Reviewed importance of HEP but will return this afternoon to have pt perform. DC recs remain appropriate. Acute PT will continue to follow per current POC.    If plan is discharge home, recommend the following: A little help with walking and/or transfers;A little help with bathing/dressing/bathroom;Assistance with cooking/housework;Supervision due to cognitive status;Assist for transportation;Direct supervision/assist for medications management;Help with stairs or ramp for entrance     Equipment Recommendations  Other (comment) (Defer to next level of care)       Precautions / Restrictions Precautions Precautions: Fall;Posterior Hip Precaution Booklet Issued: Yes (comment) Precaution Comments: wound vac Restrictions Weight Bearing Restrictions Per Provider Order: Yes LLE Weight Bearing Per Provider Order: Weight bearing as tolerated     Mobility  Bed Mobility Overal bed mobility: Needs  Assistance Bed Mobility: Supine to Sit, Sit to Supine  Supine to sit: Min assist  General bed mobility comments: Min assist to safely exit bed. Vcs for rolling L to short sit    Transfers Overall transfer level: Needs assistance Equipment used: Rolling walker (2 wheels) Transfers: Sit to/from Stand Sit to Stand: Min assist  General transfer comment: min assist to stand from lowest bed height    Ambulation/Gait Ambulation/Gait assistance: Contact guard assist Gait Distance (Feet): 40 Feet Assistive device: Rolling walker (2 wheels) Gait Pattern/deviations: Step-to pattern, Narrow base of support Gait velocity: decreased  General Gait Details: Pt was able to ambulate from EOB to RN desk and then back to recliner.   Balance Overall balance assessment: Needs assistance Sitting-balance support: Feet supported, Bilateral upper extremity supported Sitting balance-Leahy Scale: Good     Standing balance support: Bilateral upper extremity supported, During functional activity, Reliant on assistive device for balance Standing balance-Leahy Scale: Fair       Cognition Arousal: Alert Behavior During Therapy: Flat affect Overall Cognitive Status: No family/caregiver present to determine baseline cognitive functioning   General Comments: Pt is A but disoriented x 2. Poor overall awareness of situation or precautions. Reviewed precautions               Pertinent Vitals/Pain Pain Assessment Pain Assessment: 0-10 Pain Score: 4  Pain Location: buttocks/sacral Pain Descriptors / Indicators: Aching, Grimacing Pain Intervention(s): Limited activity within patient's tolerance, Monitored during session, Premedicated before session, Repositioned     PT Goals (current goals can now be found in the care plan section) Acute Rehab PT Goals Patient Stated Goal: none stated Progress towards PT goals: Progressing toward goals    Frequency    BID  Co-evaluation     PT  goals addressed during session: Mobility/safety with mobility;Balance;Proper use of DME;Strengthening/ROM        AM-PAC PT "6 Clicks" Mobility   Outcome Measure  Help needed turning from your back to your side while in a flat bed without using bedrails?: A Little Help needed moving from lying on your back to sitting on the side of a flat bed without using bedrails?: A Little Help needed moving to and from a bed to a chair (including a wheelchair)?: A Little Help needed standing up from a chair using your arms (e.g., wheelchair or bedside chair)?: A Lot Help needed to walk in hospital room?: A Little Help needed climbing 3-5 steps with a railing? : A Lot 6 Click Score: 16    End of Session   Activity Tolerance: Patient tolerated treatment well;Patient limited by fatigue Patient left: in chair;with call bell/phone within reach;with chair alarm set Nurse Communication: Mobility status;Precautions PT Visit Diagnosis: Muscle weakness (generalized) (M62.81);Other abnormalities of gait and mobility (R26.89)     Time: 4098-1191 PT Time Calculation (min) (ACUTE ONLY): 17 min  Charges:    $Therapeutic Activity: 8-22 mins PT General Charges $$ ACUTE PT VISIT: 1 Visit                     Jetta Lout PTA 01/23/23, 12:35 PM

## 2023-01-23 NOTE — Progress Notes (Signed)
PROGRESS NOTE    Adrian Gillespie   ZOX:[id number] DOB: 1950-12-15  DOA: 01/13/2023 Date of Service: 01/23/23 which is hospital day 10  PCP: Andres Shad, MD    HPI: Adrian Gillespie is a 72 y.o. male with medical history significant of HTN, HLD, COPD, asthma, GERD, depression, anxiety, schizophrenia, HBV, tobacco abuse, who presents to ED from assisted living facility with AMS, fever and fall.   Hospital course / significant events:  12/17: admitted to hospitalist service, sepsis 12/18: EEG, MRI brain no acute concerns 12/19: BCx (+)Ecoli, ID consulted, plan further imaging to delineate source. Psychiatry consulted for med management 12/20: Afib RVR, resolved. Started low dose beta blocker. CT abd/pelv concern for discitis L3/L4, noted large L hip effusion 12/21: MRI L spine no discitis/osteo 12/22: aspiration L hip effusion w/ concern for purulent fluid, ortho plan for OR tomorrow  12/23: to OR for joint washout L hip. 12/24-12/26: stable postop, leaving drains in place for now. Will need long term IV abx so SNF placement is underway  12/27: drains out, placement pending, expect will be here thru the weekend (today is Friday).    Consultants:  Neurology - informal discussion 12/18 w/ recs EEG and MRI, avoid cefepime  Infectious disease  Psychiatry  Orthopedics   Procedures/Surgeries: 01/18/23: L hip/thigh abscess aspiration w/ drain placement  01/19/23: Left hip and thigh irrigation and debridement with revision head and liner exchange with placement of custom made antibiotic beads       ASSESSMENT & PLAN:    E coli bacteremia No abdominal pain or diarrhea to suggest intra-abdominal pathology though patient unable to provide accurate history. Not retaining urine - PVR is 0. CT abdomen no intra-abdominal infection. Denied hip pain but CT of abdomen incidentally showed large left hip effusion and IR aspiration revealed purulent fluid in the left hip tracking into the thigh.  S/P Debridement and head and liner replacement  continue ceftriaxone - will need up to 12 weeks IV abx per ID ID following    Left hip septic arthritis Large effusion, incidental on CT. IR aspiration encountered purulent fluid in the left hip tracking into the [facility] performed wash-out 12/23  Orthopedics following  WBAT LLE Remove drains today per ortho Remove provena and apply honeycomb dressing 7 days after discharge  Follow up in office with [facility] in 2 weeks Lovenox 40 mg SQ daily x 14 days at discharge  Subacute compression fracture On MRI. CT showed possible signs infection but none seen on mri Pain control as needed  Urinary retention Foley out  In/out cath as needed  Acute encephalopathy w/ seizure concern, seizures reasonably ruled out  Encephalopathy more likely metabolic d/t sepsis - improved/resolved Report of fall day prior to admission, shaking, and confusion afterward that has now resolved. Discussed w/ neuro, hard to tell what's going on here. They advised mri (nothing acute) and eeg (no seizure-like activity).  clinical monitoring   Frequent falls Likely multifactorial PT consulteded, thinks he is close to his baseline, advising return to ALF though will need to be re-evaluated after surgery   Paroxysmal a-fib With rvr morning of 12/20, resolved. Chads2vasc 1 not anticoagulated, nor is he on a rate/rhythm control agent low-dose bb. Dig is an option but not a great one. Currently tolerating coreg but soft BP.   Hyponatremia Likely hypovolemic as has resolved  with hydration Relatively stable off fluids but still low, encourage po hydration  monitor BMP   Hypokalemia hypomagnesmia replete and monitor  QTC  prolongation Likely 2/2 psych meds exacerbated by electrolyte abnormalities, has improved on most recent EKG treat electrolyte abnormalities as above   COPD Quiescent home inhalers   Hypotension Likely multifactorial. Meds likely  contributing but needs beta blocker for rate control given recent RVR.  continue midodrine beta blocker rate control    Schizophrenia Ck wnl. Intermittent agitation, none the last few days continue home haldol on monthly invega as well trazodone at bedtime   Chronic pain stable Pain control as needed      DVT prophylaxis: holding Rx PPx perioperatively IV fluids: no continuous IV fluids  Nutrition: regular diet Central lines / invasive devices: drain x2 L hip,  wound vac L hip   Code Status: FULL CODE ACP documentation reviewed:  none on file in [facility]  TOC needs: SNF Barriers to dispo / significant pending items: wound vac, drains, monitoring postop, continuing IV abx, will not be able to go back to ALF on IV abx, SNF placement pending       Subjective / Brief ROS:  Patient reports hip pain is okay today otherwise no concerns at this time. Tolerating diet  No CP/SOB  Family Communication: call to sister 01/23/23 3:09 PM all questions answered      Objective Findings:  Vitals:   01/22/23 1728 01/22/23 2044 01/23/23 0739 01/23/23 1448  BP: 102/78 116/74 111/72 (!) 136/120  Pulse: 76 79 68 83  Resp: 16 16 18 18   Temp: 97.6 F (36.4 C) 98.3 F (36.8 C) 97.7 F (36.5 C) 98.2 F (36.8 C)  TempSrc:   Oral   SpO2: 95% 94% 96% 96%  Weight:      Height:        Intake/Output Summary (Last 24 hours) at 01/23/2023 1508 Last data filed at 01/23/2023 1914 Gross per 24 hour  Intake 0 ml  Output 2210 ml  Net -2210 ml   Filed Weights   01/13/23 2200 01/19/23 1126  Weight: 78.9 kg 78.9 kg    Examination:  Physical Exam Constitutional:      General: He is not in acute distress. Cardiovascular:     Rate and Rhythm: Normal rate and regular rhythm.  Pulmonary:     Effort: Pulmonary effort is normal.     Breath sounds: Normal breath sounds.  Musculoskeletal:     Right lower leg: No edema.     Left lower leg: No edema.  Skin:    General: Skin is warm and  dry.  Neurological:     General: No focal deficit present.     Mental Status: He is alert. Mental status is at baseline.  Psychiatric:        Mood and Affect: Mood normal.        Behavior: Behavior normal.          Scheduled Medications:   benztropine  1 mg Oral Daily   carvedilol  3.125 mg Oral BID WC   docusate sodium  100 mg Oral BID   enoxaparin (LOVENOX) injection  40 mg Subcutaneous Q24H   feeding supplement  237 mL Oral BID BM   haloperidol  10 mg Oral BID   midodrine  10 mg Oral TID WC   mometasone-formoterol  2 puff Inhalation BID   montelukast  10 mg Oral Daily   multivitamin with minerals  1 tablet Oral Daily   nicotine  14 mg Transdermal Daily   pantoprazole  40 mg Oral Daily   tamsulosin  0.4 mg Oral QPC supper  traZODone  300 mg Oral QHS    Continuous Infusions:  cefTRIAXone (ROCEPHIN)  IV 2 g (01/23/23 1109)    PRN Medications:  acetaminophen, albuterol, dextromethorphan-guaiFENesin, diphenhydrAMINE, HYDROcodone-acetaminophen, hydrOXYzine, LORazepam, magnesium hydroxide, menthol-cetylpyridinium **OR** phenol, metoCLOPramide **OR** metoCLOPramide (REGLAN) injection, morphine injection, mouth rinse  Antimicrobials from admission:  Anti-infectives (From admission, onward)    Start     Dose/Rate Route Frequency Ordered Stop   01/19/23 1403  gentamicin (GARAMYCIN) injection  Status:  Discontinued          As needed 01/19/23 1403 01/19/23 1523   01/19/23 1402  vancomycin (VANCOCIN) powder  Status:  Discontinued          As needed 01/19/23 1402 01/19/23 1523   01/14/23 2300  vancomycin (VANCOREADY) IVPB 1750 mg/350 mL  Status:  Discontinued        1,750 mg 175 mL/hr over 120 Minutes Intravenous Every 24 hours 01/14/23 0352 01/14/23 0840   01/14/23 1400  cefTRIAXone (ROCEPHIN) 2 g in sodium chloride 0.9 % 100 mL IVPB        2 g 200 mL/hr over 30 Minutes Intravenous Daily 01/14/23 1203     01/14/23 0900  metroNIDAZOLE (FLAGYL) IVPB 500 mg  Status:   Discontinued        500 mg 100 mL/hr over 60 Minutes Intravenous  Once 01/14/23 0124 01/14/23 0840   01/14/23 0700  ceFEPIme (MAXIPIME) 2 g in sodium chloride 0.9 % 100 mL IVPB  Status:  Discontinued        2 g 200 mL/hr over 30 Minutes Intravenous Every 8 hours 01/14/23 0349 01/14/23 0840   01/13/23 2215  vancomycin (VANCOREADY) IVPB 1750 mg/350 mL  Status:  Discontinued        1,750 mg 175 mL/hr over 120 Minutes Intravenous  Once 01/13/23 2208 01/13/23 2211   01/13/23 2215  vancomycin (VANCOCIN) IVPB 1000 mg/200 mL premix       Placed in "Followed by" Linked Group   1,000 mg 200 mL/hr over 60 Minutes Intravenous  Once 01/13/23 2211 01/14/23 0021   01/13/23 2215  vancomycin (VANCOREADY) IVPB 750 mg/150 mL       Placed in "Followed by" Linked Group   750 mg 150 mL/hr over 60 Minutes Intravenous  Once 01/13/23 2211 01/14/23 0120   01/13/23 2130  ceFEPIme (MAXIPIME) 2 g in sodium chloride 0.9 % 100 mL IVPB        2 g 200 mL/hr over 30 Minutes Intravenous  Once 01/13/23 2123 01/13/23 2319   01/13/23 2130  metroNIDAZOLE (FLAGYL) IVPB 500 mg        500 mg 100 mL/hr over 60 Minutes Intravenous  Once 01/13/23 2123 01/13/23 2319   01/13/23 2130  vancomycin (VANCOCIN) IVPB 1000 mg/200 mL premix  Status:  Discontinued        1,000 mg 200 mL/hr over 60 Minutes Intravenous  Once 01/13/23 2123 01/13/23 2207           Data Reviewed:  I have personally reviewed the following...  CBC: Recent Labs  Lab 01/19/23 0545 01/20/23 0408 01/22/23 0506  WBC 11.5* 14.0* 9.9  HGB 10.4* 10.3* 11.0*  HCT 30.9* 31.7* 32.8*  MCV 74.5* 75.8* 77.2*  PLT 376 406* 417*   Basic Metabolic Panel: Recent Labs  Lab 01/17/23 0514 01/19/23 0545 01/20/23 0408 01/22/23 0506  NA 136 135 132* 131*  K 3.8 3.9 4.6 3.8  CL 105 104 101 97*  CO2 24 21* 22 25  GLUCOSE 106* 110* 133* 111*  BUN 11 16 20 19   CREATININE 0.69 0.78 0.96 0.64  CALCIUM 8.2* 8.4* 8.6* 9.2  MG 1.9  --   --   --   PHOS 3.6  --   --    --    GFR: Estimated Creatinine Clearance: 90.2 mL/min (by C-G formula based on SCr of 0.64 mg/dL). Liver Function Tests: No results for input(s): "AST", "ALT", "ALKPHOS", "BILITOT", "PROT", "ALBUMIN" in the last 168 hours.  No results for input(s): "LIPASE", "AMYLASE" in the last 168 hours. No results for input(s): "AMMONIA" in the last 168 hours. Coagulation Profile: Recent Labs  Lab 01/18/23 0506  INR 1.1   Cardiac Enzymes: No results for input(s): "CKTOTAL", "CKMB", "CKMBINDEX", "TROPONINI" in the last 168 hours.  BNP (last 3 results) No results for input(s): "PROBNP" in the last 8760 hours. HbA1C: No results for input(s): "HGBA1C" in the last 72 hours. CBG: No results for input(s): "GLUCAP" in the last 168 hours. Lipid Profile: No results for input(s): "CHOL", "HDL", "LDLCALC", "TRIG", "CHOLHDL", "LDLDIRECT" in the last 72 hours. Thyroid Function Tests: No results for input(s): "TSH", "T4TOTAL", "FREET4", "T3FREE", "THYROIDAB" in the last 72 hours. Anemia Panel: No results for input(s): "VITAMINB12", "FOLATE", "FERRITIN", "TIBC", "IRON", "RETICCTPCT" in the last 72 hours. Most Recent Urinalysis On File:     Component Value Date/Time   COLORURINE YELLOW (A) 01/13/2023 2130   APPEARANCEUR CLEAR (A) 01/13/2023 2130   LABSPEC 1.010 01/13/2023 2130   PHURINE 6.0 01/13/2023 2130   GLUCOSEU NEGATIVE 01/13/2023 2130   HGBUR SMALL (A) 01/13/2023 2130   BILIRUBINUR NEGATIVE 01/13/2023 2130   KETONESUR NEGATIVE 01/13/2023 2130   PROTEINUR NEGATIVE 01/13/2023 2130   NITRITE NEGATIVE 01/13/2023 2130   LEUKOCYTESUR NEGATIVE 01/13/2023 2130   Sepsis Labs: @LABRCNTIP (procalcitonin:4,lacticidven:4) Microbiology: Recent Results (from the past 240 hours)  Resp panel by RT-PCR (RSV, Flu A&B, Covid) Anterior Nasal Swab     Status: None   Collection Time: 01/13/23  9:29 PM   Specimen: Anterior Nasal Swab  Result Value Ref Range Status   SARS Coronavirus 2 by RT PCR NEGATIVE  NEGATIVE Final    Comment: (NOTE) SARS-CoV-2 target nucleic acids are NOT DETECTED.  The SARS-CoV-2 RNA is generally detectable in upper respiratory specimens during the acute phase of infection. The lowest concentration of SARS-CoV-2 viral copies this assay can detect is 138 copies/mL. A negative result does not preclude SARS-Cov-2 infection and should not be used as the sole basis for treatment or other patient management decisions. A negative result may occur with  improper specimen collection/handling, submission of specimen other than nasopharyngeal swab, presence of viral mutation(s) within the areas targeted by this assay, and inadequate number of viral copies(<138 copies/mL). A negative result must be combined with clinical observations, patient history, and epidemiological information. The expected result is Negative.  Fact Sheet for Patients:  BloggerCourse.com  Fact Sheet for Healthcare Providers:  SeriousBroker.it  This test is no t yet approved or cleared by the [place] FDA and  has been authorized for detection and/or diagnosis of SARS-CoV-2 by FDA under an Emergency Use Authorization (EUA). This EUA will remain  in effect (meaning this test can be used) for the duration of the COVID-19 declaration under Section 564(b)(1) of the Act, 21 U.S.C.section 360bbb-3(b)(1), unless the authorization is terminated  or revoked sooner.       Influenza A by PCR NEGATIVE NEGATIVE Final   Influenza B by PCR NEGATIVE NEGATIVE Final    Comment: (NOTE) The Xpert Xpress SARS-CoV-2/FLU/RSV plus assay  is intended as an aid in the diagnosis of influenza from Nasopharyngeal swab specimens and should not be used as a sole basis for treatment. Nasal washings and aspirates are unacceptable for Xpert Xpress SARS-CoV-2/FLU/RSV testing.  Fact Sheet for Patients: BloggerCourse.com  Fact Sheet for Healthcare  Providers: SeriousBroker.it  This test is not yet approved or cleared by the [place] FDA and has been authorized for detection and/or diagnosis of SARS-CoV-2 by FDA under an Emergency Use Authorization (EUA). This EUA will remain in effect (meaning this test can be used) for the duration of the COVID-19 declaration under Section 564(b)(1) of the Act, 21 U.S.C. section 360bbb-3(b)(1), unless the authorization is terminated or revoked.     Resp Syncytial Virus by PCR NEGATIVE NEGATIVE Final    Comment: (NOTE) Fact Sheet for Patients: BloggerCourse.com  Fact Sheet for Healthcare Providers: SeriousBroker.it  This test is not yet approved or cleared by the [place] FDA and has been authorized for detection and/or diagnosis of SARS-CoV-2 by FDA under an Emergency Use Authorization (EUA). This EUA will remain in effect (meaning this test can be used) for the duration of the COVID-19 declaration under Section 564(b)(1) of the Act, 21 U.S.C. section 360bbb-3(b)(1), unless the authorization is terminated or revoked.  Performed at [facility], [address] [place], [place] [address]   Blood Culture (routine x 2)     Status: Abnormal   Collection Time: 01/13/23  9:29 PM   Specimen: BLOOD  Result Value Ref Range Status   Specimen Description   Final    BLOOD BLOOD LEFT ARM Performed at [facility], [address] [place], [place] [address]    Special Requests   Final    BOTTLES DRAWN AEROBIC AND ANAEROBIC Blood Culture adequate volume Performed at [facility], [address] [place], [place] [address]    Culture  Setup Time   Final    Organism ID to follow ANAEROBIC BOTTLE ONLY GRAM NEGATIVE RODS CRITICAL RESULT CALLED TO, READ BACK BY AND VERIFIED WITH: TREY GREENWOOD 01/14/23 1142 KLW Performed at [facility], [address] [place], [place]  [address]    Culture ESCHERICHIA COLI (A)  Final   Report Status 01/16/2023 FINAL  Final   Organism ID, Bacteria ESCHERICHIA COLI  Final   Organism ID, Bacteria ESCHERICHIA COLI  Final      Susceptibility   Escherichia coli - KIRBY BAUER*    CEFAZOLIN INTERMEDIATE Intermediate    Escherichia coli - MIC*    AMPICILLIN >=32 RESISTANT Resistant     CEFEPIME <=0.12 SENSITIVE Sensitive     CEFTAZIDIME <=1 SENSITIVE Sensitive     CEFTRIAXONE <=0.25 SENSITIVE Sensitive     CIPROFLOXACIN <=0.25 SENSITIVE Sensitive     GENTAMICIN <=1 SENSITIVE Sensitive     IMIPENEM <=0.25 SENSITIVE Sensitive     TRIMETH/SULFA <=20 SENSITIVE Sensitive     AMPICILLIN/SULBACTAM 16 INTERMEDIATE Intermediate     PIP/TAZO <=4 SENSITIVE Sensitive ug/mL    * ESCHERICHIA COLI    ESCHERICHIA COLI  Urine Culture     Status: None   Collection Time: 01/13/23  9:29 PM   Specimen: Urine, Clean Catch  Result Value Ref Range Status   Specimen Description   Final    URINE, CLEAN CATCH Performed at [facility], [address], [place], [place] [address]    Special Requests   Final    NONE Performed at [facility], [address], [place], [place] [address]  Culture   Final    NO GROWTH Performed at [facility], [address], [place], [place] [address]    Report Status 01/15/2023 FINAL  Final  Blood Culture ID Panel (Reflexed)     Status: Abnormal   Collection Time: 01/13/23  9:29 PM  Result Value Ref Range Status   Enterococcus faecalis NOT DETECTED NOT DETECTED Final   Enterococcus Faecium NOT DETECTED NOT DETECTED Final   Listeria monocytogenes NOT DETECTED NOT DETECTED Final   Staphylococcus species NOT DETECTED NOT DETECTED Final   Staphylococcus aureus (BCID) NOT DETECTED NOT DETECTED Final   Staphylococcus epidermidis NOT DETECTED NOT DETECTED Final   Staphylococcus lugdunensis NOT DETECTED NOT DETECTED Final   Streptococcus species NOT DETECTED NOT DETECTED Final    Streptococcus agalactiae NOT DETECTED NOT DETECTED Final   Streptococcus pneumoniae NOT DETECTED NOT DETECTED Final   Streptococcus pyogenes NOT DETECTED NOT DETECTED Final   A.calcoaceticus-baumannii NOT DETECTED NOT DETECTED Final   Bacteroides fragilis NOT DETECTED NOT DETECTED Final   Enterobacterales DETECTED (A) NOT DETECTED Final    Comment: Enterobacterales represent a large order of gram negative bacteria, not a single organism. CRITICAL RESULT CALLED TO, READ BACK BY AND VERIFIED WITH: TREY GREENWOOD 01/14/23 1142 KLW    Enterobacter cloacae complex NOT DETECTED NOT DETECTED Final   Escherichia coli DETECTED (A) NOT DETECTED Final    Comment: CRITICAL RESULT CALLED TO, READ BACK BY AND VERIFIED WITH: TREY GREENWOOD 01/14/23 1142 KLW    Klebsiella aerogenes NOT DETECTED NOT DETECTED Final   Klebsiella oxytoca NOT DETECTED NOT DETECTED Final   Klebsiella pneumoniae NOT DETECTED NOT DETECTED Final   Proteus species NOT DETECTED NOT DETECTED Final   Salmonella species NOT DETECTED NOT DETECTED Final   Serratia marcescens NOT DETECTED NOT DETECTED Final   Haemophilus influenzae NOT DETECTED NOT DETECTED Final   Neisseria meningitidis NOT DETECTED NOT DETECTED Final   Pseudomonas aeruginosa NOT DETECTED NOT DETECTED Final   Stenotrophomonas maltophilia NOT DETECTED NOT DETECTED Final   Candida albicans NOT DETECTED NOT DETECTED Final   Candida auris NOT DETECTED NOT DETECTED Final   Candida glabrata NOT DETECTED NOT DETECTED Final   Candida krusei NOT DETECTED NOT DETECTED Final   Candida parapsilosis NOT DETECTED NOT DETECTED Final   Candida tropicalis NOT DETECTED NOT DETECTED Final   Cryptococcus neoformans/gattii NOT DETECTED NOT DETECTED Final   CTX-M ESBL NOT DETECTED NOT DETECTED Final   Carbapenem resistance IMP NOT DETECTED NOT DETECTED Final   Carbapenem resistance KPC NOT DETECTED NOT DETECTED Final   Carbapenem resistance NDM NOT DETECTED NOT DETECTED Final    Carbapenem resist OXA 48 LIKE NOT DETECTED NOT DETECTED Final   Carbapenem resistance VIM NOT DETECTED NOT DETECTED Final    Comment: Performed at [facility] [address] [place], [place] [address]  Blood Culture (routine x 2)     Status: None   Collection Time: 01/13/23 11:06 PM   Specimen: BLOOD  Result Value Ref Range Status   Specimen Description BLOOD RIGHT ARM  Final   Special Requests   Final    BOTTLES DRAWN AEROBIC AND ANAEROBIC Blood Culture adequate volume   Culture   Final    NO GROWTH 5 DAYS Performed at [facility], [address], [place], [place] [address]    Report Status 01/19/2023 FINAL  Final  Respiratory (~20 pathogens) panel by PCR     Status: None   Collection Time: 01/14/23 12:15 PM   Specimen:  Nasopharyngeal Swab; Respiratory  Result Value Ref Range Status   Adenovirus NOT DETECTED NOT DETECTED Final   Coronavirus 229E NOT DETECTED NOT DETECTED Final    Comment: (NOTE) The Coronavirus on the Respiratory Panel, DOES NOT test for the novel  Coronavirus (2019 nCoV)    Coronavirus HKU1 NOT DETECTED NOT DETECTED Final   Coronavirus NL63 NOT DETECTED NOT DETECTED Final   Coronavirus OC43 NOT DETECTED NOT DETECTED Final   Metapneumovirus NOT DETECTED NOT DETECTED Final   Rhinovirus / Enterovirus NOT DETECTED NOT DETECTED Final   Influenza A NOT DETECTED NOT DETECTED Final   Influenza B NOT DETECTED NOT DETECTED Final   Parainfluenza Virus 1 NOT DETECTED NOT DETECTED Final   Parainfluenza Virus 2 NOT DETECTED NOT DETECTED Final   Parainfluenza Virus 3 NOT DETECTED NOT DETECTED Final   Parainfluenza Virus 4 NOT DETECTED NOT DETECTED Final   Respiratory Syncytial Virus NOT DETECTED NOT DETECTED Final   Bordetella pertussis NOT DETECTED NOT DETECTED Final   Bordetella Parapertussis NOT DETECTED NOT DETECTED Final   Chlamydophila pneumoniae NOT DETECTED NOT DETECTED Final   Mycoplasma pneumoniae NOT DETECTED NOT DETECTED Final     Comment: Performed at [facility], [address], [place], [place] [address]  Culture, blood (Routine X 2) w Reflex to ID Panel     Status: None   Collection Time: 01/15/23  9:14 AM   Specimen: Left Antecubital; Blood  Result Value Ref Range Status   Specimen Description LEFT ANTECUBITAL  Final   Special Requests   Final    BLOOD BOTTLES DRAWN AEROBIC AND ANAEROBIC Blood Culture adequate volume   Culture   Final    NO GROWTH 5 DAYS Performed at [facility], [address] [place], [place] [address]    Report Status 01/20/2023 FINAL  Final  Culture, blood (Routine X 2) w Reflex to ID Panel     Status: None   Collection Time: 01/15/23  9:15 AM   Specimen: BLOOD RIGHT HAND  Result Value Ref Range Status   Specimen Description BLOOD RIGHT HAND  Final   Special Requests   Final    BLOOD BOTTLES DRAWN AEROBIC AND ANAEROBIC Blood Culture adequate volume   Culture   Final    NO GROWTH 5 DAYS Performed at [facility], [address] [place], [place] [address]    Report Status 01/20/2023 FINAL  Final  Aerobic/Anaerobic Culture w Gram Stain (surgical/deep wound)     Status: None   Collection Time: 01/18/23 11:34 AM   Specimen: Drain; Body Fluid  Result Value Ref Range Status   Specimen Description   Final    JP DRAINAGE Performed at [facility], [address] [place], [place] [address]    Special Requests   Final    NONE Performed at [facility], [address] [place], [place] [address]    Gram Stain   Final    ABUNDANT WBC PRESENT, PREDOMINANTLY PMN NO ORGANISMS SEEN    Culture   Final    No growth aerobically or anaerobically. Performed at [facility], [address] [place], [place] [address]    Report Status 01/23/2023 FINAL  Final  Aerobic/Anaerobic Culture w Gram Stain (surgical/deep wound)     Status: None (Preliminary result)   Collection Time: 01/19/23  1:45 PM   Specimen: Path Tissue  Result Value Ref  Range Status   Specimen Description   Final    TISSUE Performed at  [facility], [address], [place], [place] [address]    Special Requests LEFT HIP  Final   Gram Stain   Final    RARE WBC PRESENT, PREDOMINANTLY MONONUCLEAR NO ORGANISMS SEEN    Culture   Final    NO GROWTH 4 DAYS NO ANAEROBES ISOLATED; CULTURE IN PROGRESS FOR 5 DAYS Performed at [facility], [address], [place], [place] [address]    Report Status PENDING  Incomplete  Aerobic/Anaerobic Culture w Gram Stain (surgical/deep wound)     Status: None (Preliminary result)   Collection Time: 01/19/23  1:49 PM   Specimen: Path fluid; Body Fluid  Result Value Ref Range Status   Specimen Description   Final    FLUID Performed at [facility], [address], [place], [place] [address]    Special Requests   Final    NONE Performed at [facility], [address] [place], [place] [address]    Gram Stain NO WBC SEEN NO ORGANISMS SEEN   Final   Culture   Final    NO GROWTH 4 DAYS NO ANAEROBES ISOLATED; CULTURE IN PROGRESS FOR 5 DAYS Performed at [facility], [address] [place], [place] [address]    Report Status PENDING  Incomplete  Aerobic/Anaerobic Culture w Gram Stain (surgical/deep wound)     Status: None (Preliminary result)   Collection Time: 01/19/23  1:50 PM   Specimen: Path fluid; Body Fluid  Result Value Ref Range Status   Specimen Description   Final    FLUID Performed at [facility], [address], [place], [place] [address]    Special Requests   Final    NONE Performed at [facility], [address] [place], [place] [address]    Gram Stain   Final    RARE WBC PRESENT, PREDOMINANTLY MONONUCLEAR NO ORGANISMS SEEN    Culture   Final    NO GROWTH 4 DAYS NO ANAEROBES ISOLATED; CULTURE IN PROGRESS FOR 5 DAYS Performed at [facility], [address] [place], [place] [address]    Report Status PENDING  Incomplete       Radiology Studies last 3 days: DG Pelvis Portable Result Date: 01/19/2023 CLINICAL DATA:  Postop arthroplasty. EXAM: PORTABLE PELVIS 1-2 VIEWS COMPARISON:  CT abdomen pelvis dated 01/16/2023. FINDINGS: No acute fracture or dislocation. The bones are osteopenic. Prior ORIF of the right femur and status post total left hip arthroplasty. A drainage catheter and beads noted over the left hip. A Foley catheter is present. Left hip cutaneous clips. IMPRESSION: Status post total left hip arthroplasty. Electronically Signed   By: Elgie Collard M.D.   On: 01/19/2023 19:18       Sunnie Nielsen, DO [facility] 01/23/2023, 3:08 PM    Dictation software may have been used to generate the above note. Typos may occur and escape review in typed/dictated notes. Please contact Dr Lyn Hollingshead directly for clarity if needed.  Staff may message me via secure chat in Epic  but this may not receive an immediate response,  please page me for urgent matters!  If 7PM-7AM, please contact night coverage [email or web address]

## 2023-01-23 NOTE — Progress Notes (Addendum)
Date of Admission:  01/13/2023    ID: Adrian Gillespie is a 72 y.o. male  Principal Problem:   SIRS (systemic inflammatory response syndrome) (HCC) Active Problems:   Hypokalemia   Schizophrenia (HCC)   Chronic obstructive pulmonary disease (HCC)   HTN (hypertension)   AF (paroxysmal atrial fibrillation) (HCC)   Fall at home, initial encounter   Hyponatremia   Prolonged QT interval   Electrolyte disturbance   Hypophosphatemia   Acute metabolic encephalopathy   Left shoulder pain   Hypomagnesemia   Hypotension   E coli bacteremia   Sepsis without acute organ dysfunction (HCC)   Abscess of hip, left   Prosthetic joint infection (HCC)    Subjective: Drains removed today  Medications:   benztropine  1 mg Oral Daily   carvedilol  3.125 mg Oral BID WC   docusate sodium  100 mg Oral BID   enoxaparin (LOVENOX) injection  40 mg Subcutaneous Q24H   feeding supplement  237 mL Oral BID BM   haloperidol  10 mg Oral BID   midodrine  10 mg Oral TID WC   mometasone-formoterol  2 puff Inhalation BID   montelukast  10 mg Oral Daily   multivitamin with minerals  1 tablet Oral Daily   nicotine  21 mg Transdermal Daily   pantoprazole  40 mg Oral Daily   sodium chloride flush  5 mL Intracatheter Q8H   tamsulosin  0.4 mg Oral QPC supper   traZODone  300 mg Oral QHS    Objective: Vital signs in last 24 hours: Patient Vitals for the past 24 hrs:  BP Temp Temp src Pulse Resp SpO2  01/23/23 0739 111/72 97.7 F (36.5 C) Oral 68 18 96 %  01/22/23 2044 116/74 98.3 F (36.8 C) -- 79 16 94 %  01/22/23 1728 102/78 97.6 F (36.4 C) -- 76 16 95 %      PHYSICAL EXAM:  General:awake and alert Lungs: Clear to auscultation bilaterally. No Wheezing or Rhonchi. No rales. Heart: Regular rate and rhythm, no murmur, rub or gallop. Abdomen: Soft, non-tender,not distended. Bowel sounds normal. No masses Extremities:left hip surgical dressing No drains Neurologic: Grossly non-focal  Lab  Results    Latest Ref Rng & Units 01/22/2023    5:06 AM 01/20/2023    4:08 AM 01/19/2023    5:45 AM  CBC  WBC 4.0 - 10.5 K/uL 9.9  14.0  11.5   Hemoglobin 13.0 - 17.0 g/dL 16.1  09.6  04.5   Hematocrit 39.0 - 52.0 % 32.8  31.7  30.9   Platelets 150 - 400 K/uL 417  406  376        Latest Ref Rng & Units 01/22/2023    5:06 AM 01/20/2023    4:08 AM 01/19/2023    5:45 AM  CMP  Glucose 70 - 99 mg/dL 409  811  914   BUN 8 - 23 mg/dL 19  20  16    Creatinine 0.61 - 1.24 mg/dL 7.82  9.56  2.13   Sodium 135 - 145 mmol/L 131  132  135   Potassium 3.5 - 5.1 mmol/L 3.8  4.6  3.9   Chloride 98 - 111 mmol/L 97  101  104   CO2 22 - 32 mmol/L 25  22  21    Calcium 8.9 - 10.3 mg/dL 9.2  8.6  8.4       Microbiology: Ecoli blood culture 01/13/23 12/19/24BC-NG Surgical culture X4- NG     Assessment/Plan:  Encephalopathy- metabolic resolved   Sepsis secondary to ecoli Has ecoli bacteremia On ceftriaxone Repeat BC neg  Left knee  PJI-  DAIR management Debridement and head and liner replacement continue ceftriaxone- will need up to 6   weeks of antibiotic- (as it is Gram neg organism)  03/04/23 PO Cipro 750mg  BID  is an option-  if no contraindication to use it, and no DDI, but we need to monitor closely for side effects including encephalopathy, , tendinitis, neuropathy, tendon rupture. Will need weekly CBC with diff, CMP, ESR, CRP while on antibiotic Pt is on haldol bid , potential for qtc prolongation with quinolone ( cipro less than levaquin) so need to weigh the risk and benefit of po quinolone  Continue IV ceftriaxone while in hospital to minimize quinolone exposure   MRI no evidence of discitis    Fall- pain left shoulder- xray N   Anemia   Hyponatremia- resolved     COPD   Schizophrenia  Discussed the management with care team  ID will follow him peripherally this weekend- call if needed

## 2023-01-23 NOTE — Progress Notes (Signed)
Subjective: 4 Days Post-Op Procedure(s) (LRB): HIP REVISION, HEAD AND LINER EXCHANGE (Left) IRRIGATION AND DEBRIDEMENT THIGH (Left) Patient reports pain as mild to the left hip.  Denies any issues this morning. Patient is well, and has had no acute complaints or problems Denies any CP, SOB, ABD pain. No N/V. States he has been able to void his bladder, and he has had a BM at this time. We will continue therapy today.   Objective: Vital signs in last 24 hours: Temp:  [97.6 F (36.4 C)-98.6 F (37 C)] 97.7 F (36.5 C) (12/27 0739) Pulse Rate:  [68-79] 68 (12/27 0739) Resp:  [16-18] 18 (12/27 0739) BP: (102-116)/(72-85) 111/72 (12/27 0739) SpO2:  [94 %-96 %] 96 % (12/27 0739)  Intake/Output from previous day: 12/26 0701 - 12/27 0700 In: 0  Out: 1610 [Urine:1600; Drains:10] Intake/Output this shift: No intake/output data recorded.  Recent Labs    01/22/23 0506  HGB 11.0*   Recent Labs    01/22/23 0506  WBC 9.9  RBC 4.25  HCT 32.8*  PLT 417*   Recent Labs    01/22/23 0506  NA 131*  K 3.8  CL 97*  CO2 25  BUN 19  CREATININE 0.64  GLUCOSE 111*  CALCIUM 9.2   No results for input(s): "LABPT", "INR" in the last 72 hours.   EXAM General - Patient is Alert, Appropriate, and Oriented Extremity - Neurovascular intact Sensation intact distally Intact pulses distally Dorsiflexion/Plantar flexion intact No cellulitis present Compartment soft Dressing -  Dressing intact.  Serosanguinous drainage noted to the middle 4x4 dressing , prevena intact with out drainage. 2 drains intact without significant output this morning. Prevena left intact, two other drains were removed today without complication.  4x4 with Tegaderm was applied. Motor Function - intact, moving foot and toes well on exam.   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)    HLD (hyperlipidemia)    HTN (hypertension)    Schizophrenia (HCC)     Assessment/Plan:   4 Days Post-Op Procedure(s) (LRB): HIP REVISION, HEAD AND LINER EXCHANGE (Left) IRRIGATION AND DEBRIDEMENT THIGH (Left) Principal Problem:   SIRS (systemic inflammatory response syndrome) (HCC) Active Problems:   Hypokalemia   Schizophrenia (HCC)   Chronic obstructive pulmonary disease (HCC)   HTN (hypertension)   AF (paroxysmal atrial fibrillation) (HCC)   Fall at home, initial encounter   Hyponatremia   Prolonged QT interval   Electrolyte disturbance   Hypophosphatemia   Acute metabolic encephalopathy   Left shoulder pain   Hypomagnesemia   Hypotension   E coli bacteremia   Sepsis without acute organ dysfunction (HCC)   Abscess of hip, left   Prosthetic joint infection (HCC)  Estimated body mass index is 24.27 kg/m as calculated from the following:   Height as of this encounter: 5\' 11"  (1.803 m).   Weight as of this encounter: 78.9 kg. Advance diet Up with therapy  Pain well controlled Labs show WBC of 9.9 yesterday. VSS Continue with IV abx per ID 10ccs of drainage noted over last 24 hours.  Drains were removed today without issue. Remove prevena and apply honeycomb dressing 7 days after discharge CM to assist with discharge to SNF  Follow up with [facility] ortho in 2  weeks Lovenox 40 mg SQ daily x 14 days at discharge  DVT Prophylaxis - Lovenox, TED hose, and SCDs Weight-Bearing as tolerated to left leg  J. Horris Latino, PA-C [facility] 01/23/2023, 7:53 AM

## 2023-01-23 NOTE — Plan of Care (Signed)

## 2023-01-23 NOTE — TOC Progression Note (Signed)
Transition of Care [facility]) - Progression Note    Patient Details  Name: Adrian Gillespie MRN: [id number] Date of Birth: August 02, 1950  [facility]) CM/SW Contact  Margarito Liner, LCSW Phone Number: 01/23/2023, 2:10 PM  Clinical Narrative:   PASARR obtained: [phone] E. Expires 02/21/2023. [facility] is considering referral. Left message for admissions coordinator asking what extra information they need. [employer] and [employer] have not responded yet. CSW left messages for the admissions coordinators asking them to review. [employer], [facility], and [employer] declined.  Expected Discharge Plan: Assisted Living (with home health) Barriers to Discharge: Continued Medical Work up  Expected Discharge Plan and Services       Living arrangements for the past 2 months: Assisted Living Facility                             [facility] Agency: [facility] Date [facility] Agency Contacted: 01/15/23   Representative spoke with at [facility] Agency: Unknown [place]   Social Determinants of Health (SDOH) Interventions SDOH Screenings   Food Insecurity: No Food Insecurity (01/15/2023)  Housing: Low Risk  (01/15/2023)  Transportation Needs: No Transportation Needs (01/15/2023)  Utilities: Not At Risk (01/15/2023)  Financial Resource Strain: Medium Risk (06/26/2022)   Received from [facility], [facility]  Physical Activity: Sufficiently Active (06/26/2022)   Received from [facility], [facility]  Social Connections: Moderately Isolated (07/18/2020)   Received from [facility], [facility]  Stress: No Stress Concern Present (06/26/2022)   Received from [facility], [facility]  Tobacco Use: High Risk (01/19/2023)  Health Literacy: High Risk (06/26/2022)   Received from [facility], [facility]    Readmission Risk Interventions    01/16/2023    1:02 PM 01/15/2023    9:35 AM 09/20/2022    3:20 PM  Readmission Risk  Prevention Plan  Transportation Screening Complete  Complete  PCP or Specialist Appt within 3-5 Days  Complete Complete  HRI or Home Care Consult Complete Complete Complete  Social Work Consult for Recovery Care Planning/Counseling Complete Complete Complete  Palliative Care Screening Complete Not Applicable Not Applicable  Medication Review Oceanographer) Complete  Complete

## 2023-01-23 NOTE — Progress Notes (Signed)
PT Cancellation Note  Patient Details Name: [name] MRN: [id number] DOB: [birth year]   Cancelled Treatment:     PT attempt. Pt requesting to get to [facility] for BM. Author assisted to [facility] however RN tech took over once pt was in sitting. (Non billable session <8 minutes). Acute PT will continue to follow and progress per current POC.    Adrian Gillespie 01/23/2023, 3:59 PM

## 2023-01-24 DIAGNOSIS — R6510 Systemic inflammatory response syndrome (SIRS) of non-infectious origin without acute organ dysfunction: Secondary | ICD-10-CM | POA: Diagnosis not present

## 2023-01-24 LAB — BASIC METABOLIC PANEL
Anion gap: 9 (ref 5–15)
BUN: 19 mg/dL (ref 8–23)
CO2: 26 mmol/L (ref 22–32)
Calcium: 9.4 mg/dL (ref 8.9–10.3)
Chloride: 99 mmol/L (ref 98–111)
Creatinine, Ser: 0.76 mg/dL (ref 0.61–1.24)
GFR, Estimated: >60 mL/min (ref >60)
Glucose, Bld: 113 mg/dL — ABNORMAL HIGH (ref 70–99)
Potassium: 4.1 mmol/L (ref 3.5–5.1)
Sodium: 134 mmol/L — ABNORMAL LOW (ref 135–145)

## 2023-01-24 LAB — AEROBIC/ANAEROBIC CULTURE W GRAM STAIN (SURGICAL/DEEP WOUND)
Culture: NO GROWTH
Culture: NO GROWTH
Culture: NO GROWTH
Gram Stain: NONE SEEN

## 2023-01-24 NOTE — Progress Notes (Signed)
Physical Therapy Treatment Patient Details Name: Adrian Gillespie MRN: [id number] DOB: November 03, 1950 Today's Date: 01/24/2023   History of Present Illness Pt is a 72 y.o. male with PMHx: HTN, HLD, COPD, asthma, GERD, depression, anxiety, schizophrenia, HBV, tobacco abuse, who presents with AMS, fever and fall.  MD assessment includes: encephalopathy, sepsis secondary to ecoli, frequent falls, hyponatremia, and left hip septic arthritis/prosthetic joint infection now s/p left hip and thigh irrigation and debridement with revision head and liner exchange with placement of custom made antibiotic beads on 01/19/23.    PT Comments  Pt is progressing with transfers decreasing Assist to CGA for sit<>stands and step pivot transfers with RW, cues needed for safety with RW.  Pt demonstrated decreased tolerance to standing and gait with RW ambulating 20' and requesting to get back in bed reporting 6/10 pain in L hip, RN made aware and gave pain meds.  Continued PT will assist pt towards greater activity tolerance, standing balance, and strength to increase safety with functional mobility.    If plan is discharge home, recommend the following: A little help with walking and/or transfers;A little help with bathing/dressing/bathroom;Assistance with cooking/housework;Supervision due to cognitive status;Assist for transportation;Direct supervision/assist for medications management;Help with stairs or ramp for entrance   Can travel by private vehicle     Yes  Equipment Recommendations  Other (comment) (Defer to next level of care)    Recommendations for Other Services       Precautions / Restrictions Precautions Precautions: Fall;Posterior Hip Precaution Comments: wound vac Restrictions Weight Bearing Restrictions Per Provider Order: Yes LLE Weight Bearing Per Provider Order: Weight bearing as tolerated Other Position/Activity Restrictions: Wound VAC, JP drain, and hemovac     Mobility  Bed Mobility Overal  bed mobility: Needs Assistance Bed Mobility: Supine to Sit, Sit to Supine     Supine to sit: Min assist Sit to supine: Min assist   General bed mobility comments: Min A to manage LLE.    Transfers Overall transfer level: Needs assistance Equipment used: Rolling walker (2 wheels) Transfers: Sit to/from Stand, Bed to chair/wheelchair/BSC Sit to Stand: Contact guard assist   Step pivot transfers: Contact guard assist       General transfer comment: cues for  safe technique with walker when turning to sit.    Ambulation/Gait Ambulation/Gait assistance: Contact guard assist Gait Distance (Feet): 20 Feet Assistive device: Rolling walker (2 wheels) Gait Pattern/deviations: Step-to pattern, Narrow base of support Gait velocity: decreased     General Gait Details: Pt was able to ambulate from EOB<>door.  Pt wanting to sit back down reporting increased pain in L hip.   Stairs             Wheelchair Mobility     Tilt Bed    Modified Rankin (Stroke Patients Only)       Balance Overall balance assessment: Needs assistance Sitting-balance support: Feet supported, Bilateral upper extremity supported Sitting balance-Leahy Scale: Good     Standing balance support: Bilateral upper extremity supported, During functional activity, Reliant on assistive device for balance Standing balance-Leahy Scale: Fair                              Cognition Arousal: Alert Behavior During Therapy: Flat affect Overall Cognitive Status: No family/caregiver present to determine baseline cognitive functioning  General Comments: Poor overall awareness of situation or precautions but did follow precautions for LLE        Exercises      General Comments        Pertinent Vitals/Pain Pain Assessment Pain Assessment: Faces Faces Pain Scale: Hurts even more Pain Location: LLE Pain Descriptors / Indicators: Aching,  Grimacing Pain Intervention(s): Limited activity within patient's tolerance, Patient requesting pain meds-RN notified, Monitored during session    Home Living                          Prior Function            PT Goals (current goals can now be found in the care plan section) Acute Rehab PT Goals Patient Stated Goal: none stated PT Goal Formulation: With patient Time For Goal Achievement: 02/02/23 Potential to Achieve Goals: Fair Progress towards PT goals: Progressing toward goals    Frequency    7X/week      PT Plan      Co-evaluation              AM-PAC PT "6 Clicks" Mobility   Outcome Measure  Help needed turning from your back to your side while in a flat bed without using bedrails?: A Little Help needed moving from lying on your back to sitting on the side of a flat bed without using bedrails?: A Little Help needed moving to and from a bed to a chair (including a wheelchair)?: A Little Help needed standing up from a chair using your arms (e.g., wheelchair or bedside chair)?: A Little Help needed to walk in hospital room?: A Little Help needed climbing 3-5 steps with a railing? : A Lot 6 Click Score: 17    End of Session Equipment Utilized During Treatment: Gait belt Activity Tolerance: Patient limited by pain Patient left: in bed;with chair alarm set;with call bell/phone within reach;with nursing/sitter in room Nurse Communication: Mobility status;Precautions PT Visit Diagnosis: Muscle weakness (generalized) (M62.81);Other abnormalities of gait and mobility (R26.89)     Time: 1150-1210 PT Time Calculation (min) (ACUTE ONLY): 20 min  Charges:    $Therapeutic Activity: 8-22 mins PT General Charges $$ ACUTE PT VISIT: 1 Visit                     Hortencia Conradi, PTA  01/24/23, 12:24 PM

## 2023-01-24 NOTE — Progress Notes (Signed)
Subjective: 5 Days Post-Op Procedure(s) (LRB): HIP REVISION, HEAD AND LINER EXCHANGE (Left) IRRIGATION AND DEBRIDEMENT THIGH (Left) Patient reports pain as mild to the left hip.  Denies any issues this morning. Patient is well, and has had no acute complaints or problems Denies any CP, SOB, ABD pain. No N/V. States he has been able to void his bladder, and he has had a BM at this time. We will continue therapy today.   Objective: Vital signs in last 24 hours: Temp:  [97.7 F (36.5 C)-98.6 F (37 C)] 97.7 F (36.5 C) (12/28 0753) Pulse Rate:  [60-94] 60 (12/28 0753) Resp:  [12-20] 19 (12/28 0753) BP: (112-136)/(69-86) 112/69 (12/28 0753) SpO2:  [94 %-96 %] 95 % (12/28 0753)  Intake/Output from previous day: 12/27 0701 - 12/28 0700 In: 605.7 [IV Piggyback:605.7] Out: 2350 [Urine:2350] Intake/Output this shift: Total I/O In: -  Out: 500 [Urine:500]  Recent Labs    01/22/23 0506  HGB 11.0*   Recent Labs    01/22/23 0506  WBC 9.9  RBC 4.25  HCT 32.8*  PLT 417*   Recent Labs    01/22/23 0506 01/24/23 0600  NA 131* 134*  K 3.8 4.1  CL 97* 99  CO2 25 26  BUN 19 19  CREATININE 0.64 0.76  GLUCOSE 111* 113*  CALCIUM 9.2 9.4   No results for input(s): "LABPT", "INR" in the last 72 hours.   EXAM General - Patient is sleepy during provider encounter. Able to answer question but goes back to  sleep. Extremity - Neurovascular intact Sensation intact distally Intact pulses distally Dorsiflexion/Plantar flexion intact No cellulitis present Compartment soft Dressing - Drain sites and bandages look clean without any appreciable drainage. Prevena left intact. Motor Function - intact, moving foot and toes well on exam.   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    COPD (chronic obstructive pulmonary disease) (HCC)    Depression    GERD (gastroesophageal reflux disease)    Hepatitis B  carrier (HCC)    HLD (hyperlipidemia)    HTN (hypertension)    Schizophrenia (HCC)     Assessment/Plan:   5 Days Post-Op Procedure(s) (LRB): HIP REVISION, HEAD AND LINER EXCHANGE (Left) IRRIGATION AND DEBRIDEMENT THIGH (Left) Principal Problem:   SIRS (systemic inflammatory response syndrome) (HCC) Active Problems:   Hypokalemia   Schizophrenia (HCC)   Chronic obstructive pulmonary disease (HCC)   HTN (hypertension)   AF (paroxysmal atrial fibrillation) (HCC)   Fall at home, initial encounter   Hyponatremia   Prolonged QT interval   Electrolyte disturbance   Hypophosphatemia   Acute metabolic encephalopathy   Left shoulder pain   Hypomagnesemia   Hypotension   E coli bacteremia   Sepsis without acute organ dysfunction (HCC)   Abscess of hip, left   Prosthetic joint infection (HCC)   Prosthetic joint infection of left hip (HCC)  Estimated body mass index is 24.27 kg/m as calculated from the following:   Height as of this encounter: 5\' 11"  (1.803 m).   Weight as of this encounter: 78.9 kg. Advance diet Up with therapy  Pain well controlled Labs show most recent WBC of 9.9. VSS Continue with IV abx per ID Drain sites look clean without any appreciable drainage Remove prevena and apply honeycomb dressing 7 days after discharge CM to assist with discharge to SNF  Follow up with [facility]o in 2 weeks Lovenox 40  mg SQ daily x 14 days at discharge  DVT Prophylaxis - Lovenox, TED hose, and SCDs Weight-Bearing as tolerated to left leg  Danise Edge, PA-C [facility] 01/24/2023, 9:53 AM

## 2023-01-24 NOTE — Progress Notes (Signed)
PROGRESS NOTE    Adrian Gillespie   NGE:[id number] DOB: 01/26/1951  DOA: 01/13/2023 Date of Service: 01/24/23 which is hospital day 11  PCP: Andres Shad, MD    HPI: Adrian Gillespie is a 72 y.o. male with medical history significant of HTN, HLD, COPD, asthma, GERD, depression, anxiety, schizophrenia, HBV, tobacco abuse, who presents to ED from assisted living facility with AMS, fever and fall.   Hospital course / significant events:  12/17: admitted to hospitalist service, sepsis 12/18: EEG, MRI brain no acute concerns 12/19: BCx (+)Ecoli, ID consulted, plan further imaging to delineate source. Psychiatry consulted for med management 12/20: Afib RVR, resolved. Started low dose beta blocker. CT abd/pelv concern for discitis L3/L4, noted large L hip effusion 12/21: MRI L spine no discitis/osteo 12/22: aspiration L hip effusion w/ concern for purulent fluid, ortho plan for OR tomorrow  12/23: to OR for joint washout L hip. 12/24-12/26: stable postop, leaving drains in place for now. Will need long term IV abx so SNF placement is underway  12/27-12/28: drains out 12/27 (Fri), placement pending, expect will be here thru the weekend    Consultants:  Neurology - informal discussion 12/18 w/ recs EEG and MRI, avoid cefepime  Infectious disease  Psychiatry  Orthopedics   Procedures/Surgeries: 01/18/23: L hip/thigh abscess aspiration w/ drain placement  01/19/23: Left hip and thigh irrigation and debridement with revision head and liner exchange with placement of custom made antibiotic beads       ASSESSMENT & PLAN:    E coli bacteremia No abdominal pain or diarrhea to suggest intra-abdominal pathology though patient unable to provide accurate history. Not retaining urine - PVR is 0. CT abdomen no intra-abdominal infection. Denied hip pain but CT of abdomen incidentally showed large left hip effusion and IR aspiration revealed purulent fluid in the left hip tracking into the thigh.  S/P Debridement and head and liner replacement  continue ceftriaxone - will need up to 12 weeks IV abx per ID ID following    Left hip septic arthritis Large effusion, incidental on CT. IR aspiration encountered purulent fluid in the left hip tracking into the [facility] ortho performed wash-out 12/23  Orthopedics following  WBAT LLE Remove drains yesterday per ortho Remove provena and apply honeycomb dressing 7 days after discharge  Follow up in office with [facility] in 2 weeks Lovenox 40 mg SQ daily x 14 days at discharge  Subacute compression fracture On MRI. CT showed possible signs infection but none seen on mri Pain control as needed  Urinary retention Foley out  In/out cath as needed  Acute encephalopathy w/ seizure concern, seizures reasonably ruled out  Encephalopathy more likely metabolic d/t sepsis - improved/resolved Report of fall day prior to admission, shaking, and confusion afterward that has now resolved. Discussed w/ neuro, hard to tell what's going on here. They advised mri (nothing acute) and eeg (no seizure-like activity).  clinical monitoring   Frequent falls Likely multifactorial PT consulteded, thinks he is close to his baseline, advising return to ALF though will need to be re-evaluated after surgery   Paroxysmal a-fib With rvr morning of 12/20, resolved. Chads2vasc 1 not anticoagulated, nor is he on a rate/rhythm control agent low-dose bb. Dig is an option but not a great one. Currently tolerating coreg but soft BP.   Hyponatremia Likely hypovolemic as has resolved  with hydration Relatively stable off fluids but still low, encourage po hydration  monitor BMP   Hypokalemia hypomagnesmia replete and monitor  QTC prolongation  Likely 2/2 psych meds exacerbated by electrolyte abnormalities, has improved on most recent EKG treat electrolyte abnormalities as above   COPD Quiescent home inhalers   Hypotension Likely multifactorial. Meds likely  contributing but needs beta blocker for rate control given recent RVR.  continue midodrine beta blocker rate control    Schizophrenia Ck wnl. Intermittent agitation, none the last few days continue home haldol on monthly invega as well trazodone at bedtime   Chronic pain stable Pain control as needed      DVT prophylaxis: lovenox   IV fluids: no continuous IV fluids  Nutrition: regular diet Central lines / invasive devices:  wound vac L hip   Code Status: FULL CODE ACP documentation reviewed:  none on file in [facility]  TOC needs: SNF Barriers to dispo / significant pending items: continuing IV abx, will not be able to go back to ALF on IV abx, SNF placement pending       Subjective / Brief ROS:  Patient reports hip pain is okay today otherwise no concerns at this time. Tolerating diet  No CP/SOB  Family Communication: will call sister later today      Objective Findings:  Vitals:   01/23/23 1550 01/23/23 1955 01/24/23 0419 01/24/23 0753  BP: 130/83 114/78 112/70 112/69  Pulse: 94 80 69 60  Resp:  20 12 19   Temp:  97.8 F (36.6 C) 98.6 F (37 C) 97.7 F (36.5 C)  TempSrc:  Oral Oral Oral  SpO2: 94% 96% 95% 95%  Weight:      Height:        Intake/Output Summary (Last 24 hours) at 01/24/2023 1253 Last data filed at 01/24/2023 0900 Gross per 24 hour  Intake 605.73 ml  Output 2250 ml  Net -1644.27 ml   Filed Weights   01/13/23 2200 01/19/23 1126  Weight: 78.9 kg 78.9 kg    Examination:  Physical Exam Constitutional:      General: He is not in acute distress. Cardiovascular:     Rate and Rhythm: Normal rate and regular rhythm.  Pulmonary:     Effort: Pulmonary effort is normal.     Breath sounds: Normal breath sounds.  Musculoskeletal:     Right lower leg: No edema.     Left lower leg: No edema.  Skin:    General: Skin is warm and dry.  Neurological:     General: No focal deficit present.     Mental Status: He is alert. Mental status is  at baseline.  Psychiatric:        Mood and Affect: Mood normal.        Behavior: Behavior normal.          Scheduled Medications:   benztropine  1 mg Oral Daily   carvedilol  3.125 mg Oral BID WC   docusate sodium  100 mg Oral BID   enoxaparin (LOVENOX) injection  40 mg Subcutaneous Q24H   feeding supplement  237 mL Oral BID BM   haloperidol  10 mg Oral BID   midodrine  10 mg Oral TID WC   mometasone-formoterol  2 puff Inhalation BID   montelukast  10 mg Oral Daily   multivitamin with minerals  1 tablet Oral Daily   nicotine  14 mg Transdermal Daily   pantoprazole  40 mg Oral Daily   tamsulosin  0.4 mg Oral QPC supper   traZODone  300 mg Oral QHS    Continuous Infusions:  cefTRIAXone (ROCEPHIN)  IV 2 g (01/24/23  1215)    PRN Medications:  acetaminophen, albuterol, dextromethorphan-guaiFENesin, diphenhydrAMINE, HYDROcodone-acetaminophen, hydrOXYzine, LORazepam, magnesium hydroxide, menthol-cetylpyridinium **OR** phenol, metoCLOPramide **OR** metoCLOPramide (REGLAN) injection, morphine injection, mouth rinse  Antimicrobials from admission:  Anti-infectives (From admission, onward)    Start     Dose/Rate Route Frequency Ordered Stop   01/19/23 1403  gentamicin (GARAMYCIN) injection  Status:  Discontinued          As needed 01/19/23 1403 01/19/23 1523   01/19/23 1402  vancomycin (VANCOCIN) powder  Status:  Discontinued          As needed 01/19/23 1402 01/19/23 1523   01/14/23 2300  vancomycin (VANCOREADY) IVPB 1750 mg/350 mL  Status:  Discontinued        1,750 mg 175 mL/hr over 120 Minutes Intravenous Every 24 hours 01/14/23 0352 01/14/23 0840   01/14/23 1400  cefTRIAXone (ROCEPHIN) 2 g in sodium chloride 0.9 % 100 mL IVPB        2 g 200 mL/hr over 30 Minutes Intravenous Daily 01/14/23 1203     01/14/23 0900  metroNIDAZOLE (FLAGYL) IVPB 500 mg  Status:  Discontinued        500 mg 100 mL/hr over 60 Minutes Intravenous  Once 01/14/23 0124 01/14/23 0840   01/14/23 0700   ceFEPIme (MAXIPIME) 2 g in sodium chloride 0.9 % 100 mL IVPB  Status:  Discontinued        2 g 200 mL/hr over 30 Minutes Intravenous Every 8 hours 01/14/23 0349 01/14/23 0840   01/13/23 2215  vancomycin (VANCOREADY) IVPB 1750 mg/350 mL  Status:  Discontinued        1,750 mg 175 mL/hr over 120 Minutes Intravenous  Once 01/13/23 2208 01/13/23 2211   01/13/23 2215  vancomycin (VANCOCIN) IVPB 1000 mg/200 mL premix       Placed in "Followed by" Linked Group   1,000 mg 200 mL/hr over 60 Minutes Intravenous  Once 01/13/23 2211 01/14/23 0021   01/13/23 2215  vancomycin (VANCOREADY) IVPB 750 mg/150 mL       Placed in "Followed by" Linked Group   750 mg 150 mL/hr over 60 Minutes Intravenous  Once 01/13/23 2211 01/14/23 0120   01/13/23 2130  ceFEPIme (MAXIPIME) 2 g in sodium chloride 0.9 % 100 mL IVPB        2 g 200 mL/hr over 30 Minutes Intravenous  Once 01/13/23 2123 01/13/23 2319   01/13/23 2130  metroNIDAZOLE (FLAGYL) IVPB 500 mg        500 mg 100 mL/hr over 60 Minutes Intravenous  Once 01/13/23 2123 01/13/23 2319   01/13/23 2130  vancomycin (VANCOCIN) IVPB 1000 mg/200 mL premix  Status:  Discontinued        1,000 mg 200 mL/hr over 60 Minutes Intravenous  Once 01/13/23 2123 01/13/23 2207           Data Reviewed:  I have personally reviewed the following...  CBC: Recent Labs  Lab 01/19/23 0545 01/20/23 0408 01/22/23 0506  WBC 11.5* 14.0* 9.9  HGB 10.4* 10.3* 11.0*  HCT 30.9* 31.7* 32.8*  MCV 74.5* 75.8* 77.2*  PLT 376 406* 417*   Basic Metabolic Panel: Recent Labs  Lab 01/19/23 0545 01/20/23 0408 01/22/23 0506 01/24/23 0600  NA 135 132* 131* 134*  K 3.9 4.6 3.8 4.1  CL 104 101 97* 99  CO2 21* 22 25 26   GLUCOSE 110* 133* 111* 113*  BUN 16 20 19 19   CREATININE 0.78 0.96 0.64 0.76  CALCIUM 8.4* 8.6* 9.2 9.4  GFR: Estimated Creatinine Clearance: 90.2 mL/min (by C-G formula based on SCr of 0.76 mg/dL). Liver Function Tests: No results for input(s): "AST", "ALT",  "ALKPHOS", "BILITOT", "PROT", "ALBUMIN" in the last 168 hours.  No results for input(s): "LIPASE", "AMYLASE" in the last 168 hours. No results for input(s): "AMMONIA" in the last 168 hours. Coagulation Profile: Recent Labs  Lab 01/18/23 0506  INR 1.1   Cardiac Enzymes: No results for input(s): "CKTOTAL", "CKMB", "CKMBINDEX", "TROPONINI" in the last 168 hours.  BNP (last 3 results) No results for input(s): "PROBNP" in the last 8760 hours. HbA1C: No results for input(s): "HGBA1C" in the last 72 hours. CBG: No results for input(s): "GLUCAP" in the last 168 hours. Lipid Profile: No results for input(s): "CHOL", "HDL", "LDLCALC", "TRIG", "CHOLHDL", "LDLDIRECT" in the last 72 hours. Thyroid Function Tests: No results for input(s): "TSH", "T4TOTAL", "FREET4", "T3FREE", "THYROIDAB" in the last 72 hours. Anemia Panel: No results for input(s): "VITAMINB12", "FOLATE", "FERRITIN", "TIBC", "IRON", "RETICCTPCT" in the last 72 hours. Most Recent Urinalysis On File:     Component Value Date/Time   COLORURINE YELLOW (A) 01/13/2023 2130   APPEARANCEUR CLEAR (A) 01/13/2023 2130   LABSPEC 1.010 01/13/2023 2130   PHURINE 6.0 01/13/2023 2130   GLUCOSEU NEGATIVE 01/13/2023 2130   HGBUR SMALL (A) 01/13/2023 2130   BILIRUBINUR NEGATIVE 01/13/2023 2130   KETONESUR NEGATIVE 01/13/2023 2130   PROTEINUR NEGATIVE 01/13/2023 2130   NITRITE NEGATIVE 01/13/2023 2130   LEUKOCYTESUR NEGATIVE 01/13/2023 2130   Sepsis Labs: @LABRCNTIP (procalcitonin:4,lacticidven:4) Microbiology: Recent Results (from the past 240 hours)  Culture, blood (Routine X 2) w Reflex to ID Panel     Status: None   Collection Time: 01/15/23  9:14 AM   Specimen: Left Antecubital; Blood  Result Value Ref Range Status   Specimen Description LEFT ANTECUBITAL  Final   Special Requests   Final    BLOOD BOTTLES DRAWN AEROBIC AND ANAEROBIC Blood Culture adequate volume   Culture   Final    NO GROWTH 5 DAYS Performed at [facility], [address] [place], [place] [address]    Report Status 01/20/2023 FINAL  Final  Culture, blood (Routine X 2) w Reflex to ID Panel     Status: None   Collection Time: 01/15/23  9:15 AM   Specimen: BLOOD RIGHT HAND  Result Value Ref Range Status   Specimen Description BLOOD RIGHT HAND  Final   Special Requests   Final    BLOOD BOTTLES DRAWN AEROBIC AND ANAEROBIC Blood Culture adequate volume   Culture   Final    NO GROWTH 5 DAYS Performed at [facility], [address], [place], [place] [address]    Report Status 01/20/2023 FINAL  Final  Aerobic/Anaerobic Culture w Gram Stain (surgical/deep wound)     Status: None   Collection Time: 01/18/23 11:34 AM   Specimen: Drain; Body Fluid  Result Value Ref Range Status   Specimen Description   Final    JP DRAINAGE Performed at [facility], [address] [place], [place] [address]    Special Requests   Final    NONE Performed at [facility], [address] [place], [place] [address]    Gram Stain   Final    ABUNDANT WBC PRESENT, PREDOMINANTLY PMN NO ORGANISMS SEEN    Culture   Final    No growth aerobically or anaerobically. Performed at [facility], [address] [place], [place] [address]    Report Status 01/23/2023  FINAL  Final  Aerobic/Anaerobic Culture w Gram Stain (surgical/deep wound)     Status: None   Collection Time: 01/19/23  1:45 PM   Specimen: Path Tissue  Result Value Ref Range Status   Specimen Description   Final    TISSUE Performed at [facility], [address] [place], [place] [address]    Special Requests LEFT HIP  Final   Gram Stain   Final    RARE WBC PRESENT, PREDOMINANTLY MONONUCLEAR NO ORGANISMS SEEN    Culture   Final    No growth aerobically or anaerobically. Performed at [facility], [address] [place], [place] [address]    Report Status 01/24/2023 FINAL  Final  Aerobic/Anaerobic Culture w Gram Stain  (surgical/deep wound)     Status: None (Preliminary result)   Collection Time: 01/19/23  1:49 PM   Specimen: Path fluid; Body Fluid  Result Value Ref Range Status   Specimen Description   Final    FLUID Performed at [facility], [address] [place], [place] [address]    Special Requests   Final    NONE Performed at [facility], [address] [place], [place] [address]    Gram Stain NO WBC SEEN NO ORGANISMS SEEN   Final   Culture   Final    NO GROWTH 5 DAYS NO ANAEROBES ISOLATED; CULTURE IN PROGRESS FOR 5 DAYS Performed at [facility], [address], [place], [place] [address]    Report Status PENDING  Incomplete  Aerobic/Anaerobic Culture w Gram Stain (surgical/deep wound)     Status: None (Preliminary result)   Collection Time: 01/19/23  1:50 PM   Specimen: Path fluid; Body Fluid  Result Value Ref Range Status   Specimen Description   Final    FLUID Performed at [facility], [address], [place], [place] [address]    Special Requests   Final    NONE Performed at [facility], [address] [place], [place] [address]    Gram Stain   Final    RARE WBC PRESENT, PREDOMINANTLY MONONUCLEAR NO ORGANISMS SEEN    Culture   Final    NO GROWTH 5 DAYS NO ANAEROBES ISOLATED; CULTURE IN PROGRESS FOR 5 DAYS Performed at [facility], [address], [place], [place] [address]    Report Status PENDING  Incomplete      Radiology Studies last 3 days: No results found.      Sunnie Nielsen, DO [facility] 01/24/2023, 12:53 PM    Dictation software may have been used to generate the above note. Typos may occur and escape review in typed/dictated notes. Please contact Dr Lyn Hollingshead directly for clarity if needed.  Staff may message me via secure chat in Epic  but this may not receive an immediate response,  please page me for urgent matters!  If 7PM-7AM, please contact night  coverage [email or web address]

## 2023-01-25 DIAGNOSIS — R6510 Systemic inflammatory response syndrome (SIRS) of non-infectious origin without acute organ dysfunction: Secondary | ICD-10-CM | POA: Diagnosis not present

## 2023-01-25 NOTE — TOC Progression Note (Signed)
Transition of Care [facility]) - Progression Note    Patient Details  Name: Adrian Gillespie MRN: [id number] Date of Birth: 1950/02/23  Transition of Care [facility]) CM/SW Contact  Liliana Cline, LCSW Phone Number: 01/25/2023, 11:05 AM  Clinical Narrative:    CSW reached out to [employer], [employer], and [employer] to see if they are able to review the referral - no response yet. CSW called patient's sister francis. Informed her of current bed offer at [facility] and pending bed requests. [name] states she would like to go with [facility]. She states she will transport patient to [facility].  CSW started insurance authorization in [place] [employer] and updated Revonda Standard at [place].   Expected Discharge Plan: Assisted Living (with home health) Barriers to Discharge: Continued Medical Work up  Expected Discharge Plan and Services       Living arrangements for the past 2 months: Assisted Living Facility                             [facility] Agency: [facility] Date [facility] Agency Contacted: 01/15/23   Representative spoke with at [facility] Agency: Unknown [place]   Social Determinants of Health (SDOH) Interventions SDOH Screenings   Food Insecurity: No Food Insecurity (01/15/2023)  Housing: Low Risk  (01/15/2023)  Transportation Needs: No Transportation Needs (01/15/2023)  Utilities: Not At Risk (01/15/2023)  Financial Resource Strain: Medium Risk (06/26/2022)   Received from [facility]  Physical Activity: Sufficiently Active (06/26/2022)   Received from [facility], [facility]  Social Connections: Moderately Isolated (07/18/2020)   Received from [facility]  Stress: No Stress Concern Present (06/26/2022)   Received from [facility], [facility]  Tobacco Use: High Risk (01/19/2023)  Health Literacy: High Risk (06/26/2022)   Received from [facility], [facility]    Readmission Risk Interventions    01/16/2023     1:02 PM 01/15/2023    9:35 AM 09/20/2022    3:20 PM  Readmission Risk Prevention Plan  Transportation Screening Complete  Complete  PCP or Specialist Appt within 3-5 Days  Complete Complete  HRI or Home Care Consult Complete Complete Complete  Social Work Consult for Recovery Care Planning/Counseling Complete Complete Complete  Palliative Care Screening Complete Not Applicable Not Applicable  Medication Review [facility]) Complete  Complete

## 2023-01-25 NOTE — Progress Notes (Signed)
Mobility Specialist - Progress Note    01/25/23 1510  Mobility  Activity Ambulated with assistance in room;Stood at bedside;Dangled on edge of bed  Level of Assistance Contact guard assist, steadying assist  Assistive Device Front wheel walker  Distance Ambulated (ft) 5 ft  Range of Motion/Exercises Active  LLE Weight Bearing Per Provider Order WBAT  Activity Response Tolerated well  Mobility Referral Yes  Mobility visit 1 Mobility   Pt resting in recliner on RA upon entry. Pt STS and ambulates to end of bed and then back to bed CGA with RW. Pt left in bed with needs in reach and bed alarm activated.   Johnathan Hausen Mobility Specialist 01/25/23, 3:14 PM

## 2023-01-25 NOTE — Progress Notes (Signed)
Subjective: 6 Days Post-Op Procedure(s) (LRB): HIP REVISION, HEAD AND LINER EXCHANGE (Left) IRRIGATION AND DEBRIDEMENT THIGH (Left) Patient reports pain as mild to the left hip.  Denies any issues this morning. Seems to be a little more alert today. Patient is well, and has had no acute complaints or problems Denies any CP, SOB, ABD pain. No N/V. We will continue therapy today.   Objective: Vital signs in last 24 hours: Temp:  [97.7 F (36.5 C)-97.9 F (36.6 C)] 97.7 F (36.5 C) (12/29 0838) Pulse Rate:  [76-81] 77 (12/29 0838) Resp:  [17-20] 17 (12/29 0838) BP: (103-116)/(73-81) 116/76 (12/29 0838) SpO2:  [94 %-97 %] 94 % (12/29 0838)  Intake/Output from previous day: 12/28 0701 - 12/29 0700 In: 100 [IV Piggyback:100] Out: 1000 [Urine:1000] Intake/Output this shift: No intake/output data recorded.  No results for input(s): "HGB" in the last 72 hours.  No results for input(s): "WBC", "RBC", "HCT", "PLT" in the last 72 hours.  Recent Labs    01/24/23 0600  NA 134*  K 4.1  CL 99  CO2 26  BUN 19  CREATININE 0.76  GLUCOSE 113*  CALCIUM 9.4   No results for input(s): "LABPT", "INR" in the last 72 hours.   EXAM General - Patient is sleepy during provider encounter. Able to answer question but goes back to  sleep. Extremity - Neurovascular intact Sensation intact distally Intact pulses distally Dorsiflexion/Plantar flexion intact No cellulitis present Compartment soft Dressing - Drain sites and bandages look clean without any appreciable drainage. Prevena left intact with appropriate seal. Motor Function - intact, moving foot and toes well on exam.   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)    HLD (hyperlipidemia)    HTN (hypertension)     Schizophrenia (HCC)     Assessment/Plan:   6 Days Post-Op Procedure(s) (LRB): HIP REVISION, HEAD AND LINER EXCHANGE (Left) IRRIGATION AND DEBRIDEMENT THIGH (Left) Principal Problem:   SIRS (systemic inflammatory response syndrome) (HCC) Active Problems:   Hypokalemia   Schizophrenia (HCC)   Chronic obstructive pulmonary disease (HCC)   HTN (hypertension)   AF (paroxysmal atrial fibrillation) (HCC)   Fall at home, initial encounter   Hyponatremia   Prolonged QT interval   Electrolyte disturbance   Hypophosphatemia   Acute metabolic encephalopathy   Left shoulder pain   Hypomagnesemia   Hypotension   E coli bacteremia   Sepsis without acute organ dysfunction (HCC)   Abscess of hip, left   Prosthetic joint infection (HCC)   Prosthetic joint infection of left hip (HCC)  Estimated body mass index is 24.27 kg/m as calculated from the following:   Height as of this encounter: 5\' 11"  (1.803 m).   Weight as of this encounter: 78.9 kg. Advance diet Up with therapy  Pain well controlled Labs show most recent WBC of 9.9 on 01/22/2023. VSS Continue with IV abx per ID Drain sites look clean without any appreciable drainage into bandages Plan to remove prevena and apply honeycomb dressing 7 days after discharge  CM to assist with discharge to SNF - [facility] to [facility]  Follow up with [facility] ortho in 2 weeks Lovenox 40 mg SQ daily x 14 days at discharge  DVT Prophylaxis - Lovenox, TED hose,  and SCDs Weight-Bearing as tolerated to left leg  Danise Edge, PA-C [facility] 01/25/2023, 11:17 AM

## 2023-01-25 NOTE — Progress Notes (Signed)
Physical Therapy Treatment Patient Details Name: Adrian Gillespie MRN: [id number] DOB: August 23, 1950 Today's Date: 01/25/2023   History of Present Illness Pt is a 72 y.o. male with PMHx: HTN, HLD, COPD, asthma, GERD, depression, anxiety, schizophrenia, HBV, tobacco abuse, who presents with AMS, fever and fall.  MD assessment includes: encephalopathy, sepsis secondary to ecoli, frequent falls, hyponatremia, and left hip septic arthritis/prosthetic joint infection now s/p left hip and thigh irrigation and debridement with revision head and liner exchange with placement of custom made antibiotic beads on 01/19/23.    PT Comments  Pt seen for PT tx with pt received asleep in bed, requires ongoing cuing to increase alertness, which pt does, but he remains sleepy throughout session. Pt requires mod assist for supine>sit, but is able to transfer STS with CGA. Pt ambulates into hallway with RW & CGA<>min assist, gait distance limited by fatigue. Pt declined additional gait attempt after seated rest break 2/2 fatigue. Will continue to follow pt acutely to progress mobility as able.    If plan is discharge home, recommend the following: A little help with walking and/or transfers;A little help with bathing/dressing/bathroom;Assistance with cooking/housework;Supervision due to cognitive status;Assist for transportation;Direct supervision/assist for medications management;Help with stairs or ramp for entrance   Can travel by private vehicle     Yes  Equipment Recommendations  Other (comment) (defer to next venue)    Recommendations for Other Services       Precautions / Restrictions Precautions Precautions: Fall;Posterior Hip Restrictions Weight Bearing Restrictions Per Provider Order: Yes LLE Weight Bearing Per Provider Order: Weight bearing as tolerated Other Position/Activity Restrictions: wound vac     Mobility  Bed Mobility Overal bed mobility: Needs Assistance Bed Mobility: Supine to Sit      Supine to sit: Mod assist, Used rails, HOB elevated (multimodal cuing to initiate supine>sit, assistance to scoot hips to EOB)          Transfers Overall transfer level: Needs assistance Equipment used: Rolling walker (2 wheels) Transfers: Sit to/from Stand Sit to Stand: Contact guard assist           General transfer comment: STS from EOB    Ambulation/Gait Ambulation/Gait assistance: Contact guard assist, Min assist Gait Distance (Feet): 65 Feet Assistive device: Rolling walker (2 wheels) Gait Pattern/deviations: Decreased step length - right, Decreased step length - left, Decreased stride length Gait velocity: decreased     General Gait Details: decreased weight shift to LLE during stance phase, appears to somewhat vault when weight shifting to LLE during stance phase, pushes RW slightly out in front of him   Stairs             Wheelchair Mobility     Tilt Bed    Modified Rankin (Stroke Patients Only)       Balance Overall balance assessment: Needs assistance Sitting-balance support: Feet supported, Bilateral upper extremity supported Sitting balance-Leahy Scale: Fair     Standing balance support: During functional activity, Bilateral upper extremity supported, Reliant on assistive device for balance Standing balance-Leahy Scale: Fair                              Cognition Arousal: Lethargic Behavior During Therapy: Flat affect                                   General Comments: Pt received asleep, continues to be  very sleepy throughout session, PT has to ask questions multiple times for pt to respond        Exercises      General Comments General comments (skin integrity, edema, etc.): wound vac intact, plugged in at beginning & end of session      Pertinent Vitals/Pain Pain Assessment Pain Assessment: Faces Faces Pain Scale: No hurt    Home Living                          Prior Function             PT Goals (current goals can now be found in the care plan section) Acute Rehab PT Goals Patient Stated Goal: none stated PT Goal Formulation: With patient Time For Goal Achievement: 02/02/23 Potential to Achieve Goals: Fair Progress towards PT goals: Progressing toward goals    Frequency    7X/week      PT Plan      Co-evaluation              AM-PAC PT "6 Clicks" Mobility   Outcome Measure  Help needed turning from your back to your side while in a flat bed without using bedrails?: A Little Help needed moving from lying on your back to sitting on the side of a flat bed without using bedrails?: A Little Help needed moving to and from a bed to a chair (including a wheelchair)?: A Little Help needed standing up from a chair using your arms (e.g., wheelchair or bedside chair)?: A Little Help needed to walk in hospital room?: A Little Help needed climbing 3-5 steps with a railing? : A Lot 6 Click Score: 17    End of Session   Activity Tolerance: Patient limited by fatigue Patient left: in chair;with chair alarm set;with call bell/phone within reach   PT Visit Diagnosis: Muscle weakness (generalized) (M62.81);Other abnormalities of gait and mobility (R26.89)     Time: 1610-9604 PT Time Calculation (min) (ACUTE ONLY): 13 min  Charges:    $Therapeutic Activity: 8-22 mins PT General Charges $$ ACUTE PT VISIT: 1 Visit                     Aleda Grana, PT, DPT 01/25/23, 2:25 PM   [name] 01/25/2023, 2:24 PM

## 2023-01-25 NOTE — Progress Notes (Addendum)
PROGRESS NOTE    Adrian Gillespie   WUJ:[id number] DOB: 1950/08/19  DOA: 01/13/2023 Date of Service: 01/25/23 which is hospital day 12  PCP: Andres Shad, MD    HPI: Adrian Gillespie is a 72 y.o. male with medical history significant of HTN, HLD, COPD, asthma, GERD, depression, anxiety, schizophrenia, HBV, tobacco abuse, who presents to ED from assisted living facility with AMS, fever and fall.   Hospital course / significant events:  12/17: admitted to hospitalist service, sepsis 12/18: EEG, MRI brain no acute concerns 12/19: BCx (+)Ecoli, ID consulted, plan further imaging to delineate source. Psychiatry consulted for med management 12/20: Afib RVR, resolved. Started low dose beta blocker. CT abd/pelv concern for discitis L3/L4, noted large L hip effusion 12/21: MRI L spine no discitis/osteo 12/22: aspiration L hip effusion w/ concern for purulent fluid, ortho plan for OR tomorrow  12/23: to OR for joint washout L hip. 12/24-12/26: stable postop, leaving drains in place for now. Will need long term IV abx so SNF placement is underway  12/27-12/29: drains out 12/27 (Fri), placement pending, here thru the weekend    Consultants:  Neurology - informal discussion 12/18 w/ recs EEG and MRI, avoid cefepime  Infectious disease  Psychiatry  Orthopedics   Procedures/Surgeries: 01/18/23: L hip/thigh abscess aspiration w/ drain placement  01/19/23: Left hip and thigh irrigation and debridement with revision head and liner exchange with placement of custom made antibiotic beads       ASSESSMENT & PLAN:    E coli bacteremia No abdominal pain or diarrhea to suggest intra-abdominal pathology though patient unable to provide accurate history. Not retaining urine - PVR is 0. CT abdomen no intra-abdominal infection. Denied hip pain but CT of abdomen incidentally showed large left hip effusion and IR aspiration revealed purulent fluid in the left hip tracking into the thigh. S/P Debridement  and head and liner replacement  continue ceftriaxone - will need 6 weeks IV abx per ID ID following    Left hip septic arthritis Large effusion, incidental on CT. IR aspiration encountered purulent fluid in the left hip tracking into the [facility]o performed wash-out 12/23  Orthopedics following  WBAT LLE Remove drains yesterday per ortho Remove provena and apply honeycomb dressing 7 days after discharge  Follow up in office with [facility] in 2 weeks Lovenox 40 mg SQ daily x 14 days at discharge  Subacute compression fracture On MRI. CT showed possible signs infection but none seen on mri Pain control as needed  Urinary retention Foley out  In/out cath as needed  Acute encephalopathy w/ seizure concern, seizures reasonably ruled out  Encephalopathy more likely metabolic d/t sepsis - improved/resolved Report of fall day prior to admission, shaking, and confusion afterward that has now resolved. Discussed w/ neuro, hard to tell what's going on here. They advised mri (nothing acute) and eeg (no seizure-like activity).  clinical monitoring   Frequent falls Likely multifactorial PT consulteded, thinks he is close to his baseline, advising return to ALF though will need to be re-evaluated after surgery   Paroxysmal a-fib With rvr morning of 12/20, resolved. Chads2vasc 1 not anticoagulated, nor is he on a rate/rhythm control agent low-dose bb. Dig is an option but not a great one. Currently tolerating coreg but soft BP.   Hyponatremia Likely hypovolemic as has resolved  with hydration Relatively stable off fluids but still low, encourage po hydration  monitor BMP   Hypokalemia hypomagnesmia replete and monitor  QTC prolongation Likely 2/2 psych meds exacerbated  by electrolyte abnormalities, has improved on most recent EKG treat electrolyte abnormalities as above   COPD Quiescent home inhalers   Hypotension Likely multifactorial. Meds likely contributing but needs beta  blocker for rate control given recent RVR.  continue midodrine beta blocker rate control    Schizophrenia Ck wnl. Intermittent agitation, none the last few days continue home haldol on monthly invega as well trazodone at bedtime   Chronic pain stable Pain control as needed      DVT prophylaxis: lovenox   IV fluids: no continuous IV fluids  Nutrition: regular diet Central lines / invasive devices:  wound vac L hip   Code Status: FULL CODE ACP documentation reviewed:  none on file in [facility]  TOC needs: SNF - [facility] is pending as of 01/25/23  Barriers to dispo / significant pending items: continuing IV abx, will not be able to go back to ALF on IV abx, SNF placement pending but expect discharge once auth is complete and IV abx orders are confirmed/finalized        Subjective / Brief ROS:  Patient reports no concerns at this time. Tolerating diet  No CP/SOB  Family Communication: called sister 01/25/23 5:16 PM all questions answered      Objective Findings:  Vitals:   01/24/23 2014 01/25/23 0403 01/25/23 0838 01/25/23 1621  BP: 104/75 110/81 116/76 100/64  Pulse: 77 76 77 82  Resp: 20 20 17 18   Temp: 97.9 F (36.6 C) 97.7 F (36.5 C) 97.7 F (36.5 C) 97.7 F (36.5 C)  TempSrc: Oral Oral    SpO2: 96% 97% 94% 96%  Weight:      Height:        Intake/Output Summary (Last 24 hours) at 01/25/2023 1716 Last data filed at 01/24/2023 2117 Gross per 24 hour  Intake --  Output 500 ml  Net -500 ml   Filed Weights   01/13/23 2200 01/19/23 1126  Weight: 78.9 kg 78.9 kg    Examination:  Physical Exam Constitutional:      General: He is not in acute distress. Cardiovascular:     Rate and Rhythm: Normal rate and regular rhythm.  Pulmonary:     Effort: Pulmonary effort is normal.     Breath sounds: Normal breath sounds.  Musculoskeletal:     Right lower leg: No edema.     Left lower leg: No edema.  Skin:    General: Skin is warm and dry.  Neurological:      General: No focal deficit present.     Mental Status: He is alert. Mental status is at baseline.  Psychiatric:        Mood and Affect: Mood normal.        Behavior: Behavior normal.          Scheduled Medications:   benztropine  1 mg Oral Daily   carvedilol  3.125 mg Oral BID WC   docusate sodium  100 mg Oral BID   enoxaparin (LOVENOX) injection  40 mg Subcutaneous Q24H   feeding supplement  237 mL Oral BID BM   haloperidol  10 mg Oral BID   midodrine  10 mg Oral TID WC   mometasone-formoterol  2 puff Inhalation BID   montelukast  10 mg Oral Daily   multivitamin with minerals  1 tablet Oral Daily   nicotine  14 mg Transdermal Daily   pantoprazole  40 mg Oral Daily   tamsulosin  0.4 mg Oral QPC supper   traZODone  300  mg Oral QHS    Continuous Infusions:  cefTRIAXone (ROCEPHIN)  IV 2 g (01/25/23 1158)    PRN Medications:  acetaminophen, albuterol, dextromethorphan-guaiFENesin, diphenhydrAMINE, HYDROcodone-acetaminophen, hydrOXYzine, LORazepam, magnesium hydroxide, menthol-cetylpyridinium **OR** phenol, metoCLOPramide **OR** metoCLOPramide (REGLAN) injection, morphine injection, mouth rinse  Antimicrobials from admission:  Anti-infectives (From admission, onward)    Start     Dose/Rate Route Frequency Ordered Stop   01/19/23 1403  gentamicin (GARAMYCIN) injection  Status:  Discontinued          As needed 01/19/23 1403 01/19/23 1523   01/19/23 1402  vancomycin (VANCOCIN) powder  Status:  Discontinued          As needed 01/19/23 1402 01/19/23 1523   01/14/23 2300  vancomycin (VANCOREADY) IVPB 1750 mg/350 mL  Status:  Discontinued        1,750 mg 175 mL/hr over 120 Minutes Intravenous Every 24 hours 01/14/23 0352 01/14/23 0840   01/14/23 1400  cefTRIAXone (ROCEPHIN) 2 g in sodium chloride 0.9 % 100 mL IVPB        2 g 200 mL/hr over 30 Minutes Intravenous Daily 01/14/23 1203     01/14/23 0900  metroNIDAZOLE (FLAGYL) IVPB 500 mg  Status:  Discontinued        500  mg 100 mL/hr over 60 Minutes Intravenous  Once 01/14/23 0124 01/14/23 0840   01/14/23 0700  ceFEPIme (MAXIPIME) 2 g in sodium chloride 0.9 % 100 mL IVPB  Status:  Discontinued        2 g 200 mL/hr over 30 Minutes Intravenous Every 8 hours 01/14/23 0349 01/14/23 0840   01/13/23 2215  vancomycin (VANCOREADY) IVPB 1750 mg/350 mL  Status:  Discontinued        1,750 mg 175 mL/hr over 120 Minutes Intravenous  Once 01/13/23 2208 01/13/23 2211   01/13/23 2215  vancomycin (VANCOCIN) IVPB 1000 mg/200 mL premix       Placed in "Followed by" Linked Group   1,000 mg 200 mL/hr over 60 Minutes Intravenous  Once 01/13/23 2211 01/14/23 0021   01/13/23 2215  vancomycin (VANCOREADY) IVPB 750 mg/150 mL       Placed in "Followed by" Linked Group   750 mg 150 mL/hr over 60 Minutes Intravenous  Once 01/13/23 2211 01/14/23 0120   01/13/23 2130  ceFEPIme (MAXIPIME) 2 g in sodium chloride 0.9 % 100 mL IVPB        2 g 200 mL/hr over 30 Minutes Intravenous  Once 01/13/23 2123 01/13/23 2319   01/13/23 2130  metroNIDAZOLE (FLAGYL) IVPB 500 mg        500 mg 100 mL/hr over 60 Minutes Intravenous  Once 01/13/23 2123 01/13/23 2319   01/13/23 2130  vancomycin (VANCOCIN) IVPB 1000 mg/200 mL premix  Status:  Discontinued        1,000 mg 200 mL/hr over 60 Minutes Intravenous  Once 01/13/23 2123 01/13/23 2207           Data Reviewed:  I have personally reviewed the following...  CBC: Recent Labs  Lab 01/19/23 0545 01/20/23 0408 01/22/23 0506  WBC 11.5* 14.0* 9.9  HGB 10.4* 10.3* 11.0*  HCT 30.9* 31.7* 32.8*  MCV 74.5* 75.8* 77.2*  PLT 376 406* 417*   Basic Metabolic Panel: Recent Labs  Lab 01/19/23 0545 01/20/23 0408 01/22/23 0506 01/24/23 0600  NA 135 132* 131* 134*  K 3.9 4.6 3.8 4.1  CL 104 101 97* 99  CO2 21* 22 25 26   GLUCOSE 110* 133* 111* 113*  BUN 16  20 19 19   CREATININE 0.78 0.96 0.64 0.76  CALCIUM 8.4* 8.6* 9.2 9.4   GFR: Estimated Creatinine Clearance: 90.2 mL/min (by C-G formula  based on SCr of 0.76 mg/dL). Liver Function Tests: No results for input(s): "AST", "ALT", "ALKPHOS", "BILITOT", "PROT", "ALBUMIN" in the last 168 hours.  No results for input(s): "LIPASE", "AMYLASE" in the last 168 hours. No results for input(s): "AMMONIA" in the last 168 hours. Coagulation Profile: No results for input(s): "INR", "PROTIME" in the last 168 hours.  Cardiac Enzymes: No results for input(s): "CKTOTAL", "CKMB", "CKMBINDEX", "TROPONINI" in the last 168 hours.  BNP (last 3 results) No results for input(s): "PROBNP" in the last 8760 hours. HbA1C: No results for input(s): "HGBA1C" in the last 72 hours. CBG: No results for input(s): "GLUCAP" in the last 168 hours. Lipid Profile: No results for input(s): "CHOL", "HDL", "LDLCALC", "TRIG", "CHOLHDL", "LDLDIRECT" in the last 72 hours. Thyroid Function Tests: No results for input(s): "TSH", "T4TOTAL", "FREET4", "T3FREE", "THYROIDAB" in the last 72 hours. Anemia Panel: No results for input(s): "VITAMINB12", "FOLATE", "FERRITIN", "TIBC", "IRON", "RETICCTPCT" in the last 72 hours. Most Recent Urinalysis On File:     Component Value Date/Time   COLORURINE YELLOW (A) 01/13/2023 2130   APPEARANCEUR CLEAR (A) 01/13/2023 2130   LABSPEC 1.010 01/13/2023 2130   PHURINE 6.0 01/13/2023 2130   GLUCOSEU NEGATIVE 01/13/2023 2130   HGBUR SMALL (A) 01/13/2023 2130   BILIRUBINUR NEGATIVE 01/13/2023 2130   KETONESUR NEGATIVE 01/13/2023 2130   PROTEINUR NEGATIVE 01/13/2023 2130   NITRITE NEGATIVE 01/13/2023 2130   LEUKOCYTESUR NEGATIVE 01/13/2023 2130   Sepsis Labs: @LABRCNTIP (procalcitonin:4,lacticidven:4) Microbiology: Recent Results (from the past 240 hours)  Aerobic/Anaerobic Culture w Gram Stain (surgical/deep wound)     Status: None   Collection Time: 01/18/23 11:34 AM   Specimen: Drain; Body Fluid  Result Value Ref Range Status   Specimen Description   Final    JP DRAINAGE Performed at [facility], [address] [place], [place] [address]    Special Requests   Final    NONE Performed at [facility], [address] [place], [place] [address]    Gram Stain   Final    ABUNDANT WBC PRESENT, PREDOMINANTLY PMN NO ORGANISMS SEEN    Culture   Final    No growth aerobically or anaerobically. Performed at [facility], [address], [place], [place] [address]    Report Status 01/23/2023 FINAL  Final  Aerobic/Anaerobic Culture w Gram Stain (surgical/deep wound)     Status: None   Collection Time: 01/19/23  1:45 PM   Specimen: Path Tissue  Result Value Ref Range Status   Specimen Description   Final    TISSUE Performed at [facility], [address] [place], [place] [address]    Special Requests LEFT HIP  Final   Gram Stain   Final    RARE WBC PRESENT, PREDOMINANTLY MONONUCLEAR NO ORGANISMS SEEN    Culture   Final    No growth aerobically or anaerobically. Performed at [facility], [address] [place], [place] [address]    Report Status 01/24/2023 FINAL  Final  Aerobic/Anaerobic Culture w Gram Stain (surgical/deep wound)     Status: None   Collection Time: 01/19/23  1:49 PM   Specimen: Path fluid; Body Fluid  Result Value Ref Range Status   Specimen Description   Final    FLUID Performed at [facility], [address] [place], [place] [address]  Special Requests   Final    NONE Performed at [facility], [address] [place], [place] [address]    Gram Stain NO WBC SEEN NO ORGANISMS SEEN   Final   Culture   Final    No growth aerobically or anaerobically. Performed at [facility], [address], [place], [place] [address]    Report Status 01/24/2023 FINAL  Final  Aerobic/Anaerobic Culture w Gram Stain (surgical/deep wound)     Status: None   Collection Time: 01/19/23  1:50 PM   Specimen: Path fluid; Body Fluid  Result Value Ref Range Status   Specimen Description   Final    FLUID Performed at [facility], [address] [place], [place] [address]    Special Requests   Final    NONE Performed at [facility], [address] [place], [place] [address]    Gram Stain   Final    RARE WBC PRESENT, PREDOMINANTLY MONONUCLEAR NO ORGANISMS SEEN    Culture   Final    No growth aerobically or anaerobically. Performed at [facility], [address] [place], [place] [address]    Report Status 01/24/2023 FINAL  Final      Radiology Studies last 3 days: No results found.      Sunnie Nielsen, DO [facility] 01/25/2023, 5:16 PM    Dictation software may have been used to generate the above note. Typos may occur and escape review in typed/dictated notes. Please contact Dr Lyn Hollingshead directly for clarity if needed.  Staff may message me via secure chat in Epic  but this may not receive an immediate response,  please page me for urgent matters!  If 7PM-7AM, please contact night coverage [email or web address]

## 2023-01-26 ENCOUNTER — Other Ambulatory Visit: Payer: Self-pay

## 2023-01-26 DIAGNOSIS — R7881 Bacteremia: Secondary | ICD-10-CM | POA: Diagnosis not present

## 2023-01-26 DIAGNOSIS — T8454XA Infection and inflammatory reaction due to internal left knee prosthesis, initial encounter: Secondary | ICD-10-CM | POA: Diagnosis not present

## 2023-01-26 DIAGNOSIS — R6510 Systemic inflammatory response syndrome (SIRS) of non-infectious origin without acute organ dysfunction: Secondary | ICD-10-CM | POA: Diagnosis not present

## 2023-01-26 DIAGNOSIS — B9620 Unspecified Escherichia coli [E. coli] as the cause of diseases classified elsewhere: Secondary | ICD-10-CM | POA: Diagnosis not present

## 2023-01-26 MED ORDER — SODIUM CHLORIDE 0.9 % IV SOLN: 2.0000 g | Freq: Every day | INTRAVENOUS | Status: DC

## 2023-01-26 MED ORDER — ENOXAPARIN SODIUM 40 MG/0.4ML IJ SOSY: 40.0000 mg | PREFILLED_SYRINGE | INTRAMUSCULAR | 0 refills | Status: AC

## 2023-01-26 MED ORDER — MIDODRINE HCL 10 MG PO TABS: 10.0000 mg | ORAL_TABLET | Freq: Three times a day (TID) | ORAL | Status: AC

## 2023-01-26 MED ORDER — HYDROCODONE-ACETAMINOPHEN 5-325 MG PO TABS: 1.0000 | ORAL_TABLET | Freq: Four times a day (QID) | ORAL | 0 refills | Status: AC | PRN

## 2023-01-26 MED ORDER — CARVEDILOL 3.125 MG PO TABS: 3.1250 mg | ORAL_TABLET | Freq: Two times a day (BID) | ORAL | Status: AC

## 2023-01-26 MED ORDER — DOCUSATE SODIUM 100 MG PO CAPS: 100.0000 mg | ORAL_CAPSULE | Freq: Two times a day (BID) | ORAL | Status: AC

## 2023-01-26 NOTE — Progress Notes (Signed)
Date of Admission:  01/13/2023    ID: Adrian Gillespie is a 72 y.o. male  Principal Problem:   SIRS (systemic inflammatory response syndrome) (HCC) Active Problems:   Hypokalemia   Schizophrenia (HCC)   Chronic obstructive pulmonary disease (HCC)   HTN (hypertension)   AF (paroxysmal atrial fibrillation) (HCC)   Fall at home, initial encounter   Hyponatremia   Prolonged QT interval   Electrolyte disturbance   Hypophosphatemia   Acute metabolic encephalopathy   Left shoulder pain   Hypomagnesemia   Hypotension   E coli bacteremia   Sepsis without acute organ dysfunction (HCC)   Abscess of hip, left   Prosthetic joint infection (HCC)   Prosthetic joint infection of left hip (HCC)    Subjective: Doing fine  Medications:   benztropine  1 mg Oral Daily   carvedilol  3.125 mg Oral BID WC   docusate sodium  100 mg Oral BID   enoxaparin (LOVENOX) injection  40 mg Subcutaneous Q24H   feeding supplement  237 mL Oral BID BM   haloperidol  10 mg Oral BID   midodrine  10 mg Oral TID WC   mometasone-formoterol  2 puff Inhalation BID   montelukast  10 mg Oral Daily   multivitamin with minerals  1 tablet Oral Daily   nicotine  14 mg Transdermal Daily   pantoprazole  40 mg Oral Daily   tamsulosin  0.4 mg Oral QPC supper   traZODone  300 mg Oral QHS    Objective: Vital signs in last 24 hours: Patient Vitals for the past 24 hrs:  BP Temp Temp src Pulse Resp SpO2  01/26/23 0803 105/74 97.9 F (36.6 C) Oral 74 18 94 %  01/26/23 0122 -- -- -- -- -- 95 %  01/26/23 0120 107/72 98.2 F (36.8 C) Oral 86 18 92 %  01/25/23 2034 111/82 97.7 F (36.5 C) Oral 77 19 98 %  01/25/23 1621 100/64 97.7 F (36.5 C) -- 82 18 96 %      PHYSICAL EXAM:  General:awake and alert Lungs: Clear to auscultation bilaterally. No Wheezing or Rhonchi. No rales. Heart: Regular rate and rhythm, no murmur, rub or gallop. Abdomen: Soft, non-tender,not distended. Bowel sounds normal. No  masses Extremities:left hip surgical dressing, prevna vac No drains Neurologic: Grossly non-focal  Lab Results    Latest Ref Rng & Units 01/22/2023    5:06 AM 01/20/2023    4:08 AM 01/19/2023    5:45 AM  CBC  WBC 4.0 - 10.5 K/uL 9.9  14.0  11.5   Hemoglobin 13.0 - 17.0 g/dL 21.3  08.6  57.8   Hematocrit 39.0 - 52.0 % 32.8  31.7  30.9   Platelets 150 - 400 K/uL 417  406  376        Latest Ref Rng & Units 01/24/2023    6:00 AM 01/22/2023    5:06 AM 01/20/2023    4:08 AM  CMP  Glucose 70 - 99 mg/dL 469  629  528   BUN 8 - 23 mg/dL 19  19  20    Creatinine 0.61 - 1.24 mg/dL 4.13  2.44  0.10   Sodium 135 - 145 mmol/L 134  131  132   Potassium 3.5 - 5.1 mmol/L 4.1  3.8  4.6   Chloride 98 - 111 mmol/L 99  97  101   CO2 22 - 32 mmol/L 26  25  22    Calcium 8.9 - 10.3 mg/dL 9.4  9.2  8.6       Microbiology: Ecoli blood culture 01/13/23 12/19/24BC-NG Surgical culture X4- NG     Assessment/Plan: Encephalopathy- metabolic resolved   Sepsis secondary to ecoli Has ecoli bacteremia On ceftriaxone Repeat BC neg  Left knee  PJI-  DAIR management Debridement and head and liner replacement continue ceftriaxone- will need up to 6   weeks of antibiotic- (as it is Gram neg organism)  03/04/23 PO Cipro 750mg  BID  is an option-  but Pt is on haldol bid , potential for qtc prolongation with quinolone ( cipro less than levaquin) so need to weigh the risk and benefit of po quinolone  Hence will Continue IV ceftriaxone   MRI no evidence of discitis    Fall- pain left shoulder- xray N   Anemia   Hyponatremia- resolved     COPD   Schizophrenia  Discussed the management with care team

## 2023-01-26 NOTE — TOC Progression Note (Signed)
Transition of Care [facility]) - Progression Note    Patient Details  Name: Adrian Gillespie MRN: [id number] Date of Birth: 05-01-1950  Transition of Care [facility]) CM/SW Contact  Chapman Fitch, RN Phone Number: 01/26/2023, 4:07 PM  Clinical Narrative:     Insurance auth approved for [employer].  Valid through 1/1.  Per MD patient needs PICC line place prior to dc   Expected Discharge Plan: Assisted Living (with home health) Barriers to Discharge: Continued Medical Work up  Expected Discharge Plan and Services       Living arrangements for the past 2 months: Assisted Living Facility                             [facility] Agency: [facility] Date [facility] Agency Contacted: 01/15/23   Representative spoke with at [facility] Agency: Unknown [place]   Social Determinants of Health (SDOH) Interventions SDOH Screenings   Food Insecurity: No Food Insecurity (01/15/2023)  Housing: Low Risk  (01/15/2023)  Transportation Needs: No Transportation Needs (01/15/2023)  Utilities: Not At Risk (01/15/2023)  Financial Resource Strain: Medium Risk (06/26/2022)   Received from [facility], [facility]  Physical Activity: Sufficiently Active (06/26/2022)   Received from [facility], [facility]  Social Connections: Moderately Isolated (07/18/2020)   Received from [facility], [facility]  Stress: No Stress Concern Present (06/26/2022)   Received from [facility], [facility]  Tobacco Use: High Risk (01/19/2023)  Health Literacy: High Risk (06/26/2022)   Received from [facility], [facility]    Readmission Risk Interventions    01/16/2023    1:02 PM 01/15/2023    9:35 AM 09/20/2022    3:20 PM  Readmission Risk Prevention Plan  Transportation Screening Complete  Complete  PCP or Specialist Appt within 3-5 Days  Complete Complete  HRI or Home Care Consult Complete Complete Complete  Social Work Consult for Recovery Care Planning/Counseling Complete  Complete Complete  Palliative Care Screening Complete Not Applicable Not Applicable  Medication Review Oceanographer) Complete  Complete

## 2023-01-26 NOTE — Progress Notes (Signed)
Progress Note   Patient: Adrian Gillespie YQM:[id number] DOB: 03-16-1950 DOA: 01/13/2023     13 DOS: the patient was seen and examined on 01/26/2023  HPI: Adrian Gillespie is a 72 y.o. male with medical history significant of HTN, HLD, COPD, asthma, GERD, depression, anxiety, schizophrenia, HBV, tobacco abuse, who presents to ED from assisted living facility with AMS, fever and fall.    Hospital course / significant events:  12/17: admitted to hospitalist service, sepsis 12/18: EEG, MRI brain no acute concerns 12/19: BCx (+)Ecoli, ID consulted, plan further imaging to delineate source. Psychiatry consulted for med management 12/20: Afib RVR, resolved. Started low dose beta blocker. CT abd/pelv concern for discitis L3/L4, noted large L hip effusion 12/21: MRI L spine no discitis/osteo 12/22: aspiration L hip effusion w/ concern for purulent fluid, ortho plan for OR tomorrow  12/23: to OR for joint washout L hip. 12/24-12/26: stable postop, leaving drains in place for now. Will need long term IV abx so SNF placement is underway  12/27-12/29: drains out 12/27 (Fri), placement pending, here thru the weekend      Consultants:  Neurology - informal discussion 12/18 w/ recs EEG and MRI, avoid cefepime  Infectious disease  Psychiatry  Orthopedics    Procedures/Surgeries: 01/18/23: L hip/thigh abscess aspiration w/ drain placement  01/19/23: Left hip and thigh irrigation and debridement with revision head and liner exchange with placement of custom made antibiotic beads      ASSESSMENT & PLAN:    E coli bacteremia No abdominal pain or diarrhea to suggest intra-abdominal pathology though patient unable to provide accurate history. Not retaining urine - PVR is 0. CT abdomen no intra-abdominal infection. Denied hip pain but CT of abdomen incidentally showed large left hip effusion and IR aspiration revealed purulent fluid in the left hip tracking into the thigh. S/P Debridement and head and liner  replacement  continue ceftriaxone - will need 6 weeks IV abx per ID last day 03/04/2023 PICC line requested I have discussed the case with infectious disease    Left hip septic arthritis Large effusion, incidental on CT. IR aspiration encountered purulent fluid in the left hip tracking into the [facility]o performed wash-out 12/23  Orthopedics following  WBAT LLE Remove drains yesterday per ortho Remove provena and apply honeycomb dressing 7 days after discharge  Follow up in office with [facility]o in 2 weeks Lovenox 40 mg SQ daily x 14 days at discharge   Subacute compression fracture On MRI. CT showed possible signs infection but none seen on mri Pain control as needed   Urinary retention Foley out  In/out cath as needed   Acute encephalopathy w/ seizure concern, seizures reasonably ruled out  Encephalopathy more likely metabolic d/t sepsis - improved/resolved Report of fall day prior to admission, shaking, and confusion afterward that has now resolved. Discussed w/ neuro, hard to tell what's going on here. They advised mri (nothing acute) and eeg (no seizure-like activity).  clinical monitoring   Frequent falls Likely multifactorial PT consulteded, thinks he is close to his baseline, advising return to ALF though will need to be re-evaluated after surgery   Paroxysmal a-fib With rvr morning of 12/20, resolved. Chads2vasc 1 not anticoagulated, nor is he on a rate/rhythm control agent low-dose bb. Dig is an option but not a great one. Currently tolerating coreg but soft BP.   Hyponatremia Likely hypovolemic as has resolved  with hydration Relatively stable off fluids but still low, encourage po hydration  monitor BMP  Hypokalemia hypomagnesmia replete and monitor   QTC prolongation Likely 2/2 psych meds exacerbated by electrolyte abnormalities, has improved on most recent EKG treat electrolyte abnormalities as above   COPD Quiescent home inhalers    Hypotension Likely multifactorial. Meds likely contributing but needs beta blocker for rate control given recent RVR.  continue midodrine beta blocker rate control    Schizophrenia Ck wnl. Intermittent agitation, none the last few days continue home haldol on monthly invega as well trazodone at bedtime   Chronic pain stable Pain control as needed    DVT prophylaxis: lovenox   IV fluids: no continuous IV fluids  Nutrition: regular diet Central lines / invasive devices:  wound vac L hip    Code Status: FULL CODE  TOC needs: SNF - [facility] is pending as of 01/25/23  Barriers to dispo / significant pending items: continuing IV abx, will not be able to go back to ALF on IV abx, SNF placement pending but expect discharge once auth is complete and IV abx orders are confirmed/finalized      Subjective / Brief ROS:  Patient seen and examined at bedside this morning Currently needs to complete a total of 6 weeks of IV antibiotics I have requested for a PICC line Denies nausea vomiting chest pain or cough   Family Communication: called sister 01/25/23 5:16 PM all questions answered    Examination:  Physical Exam Constitutional:      General: He is not in acute distress. Cardiovascular:     Rate and Rhythm: Normal rate and regular rhythm.  Pulmonary:     Effort: Pulmonary effort is normal.     Breath sounds: Normal breath sounds.  Musculoskeletal:     Right lower leg: No edema.     Left lower leg: No edema.  Skin:    General: Skin is warm and dry.  Neurological:     General: No focal deficit present.     Mental Status: He is alert. Mental status is at baseline.  Psychiatric:        Mood and Affect: Mood normal.        Behavior: Behavior normal.       Vitals:   01/25/23 2034 01/26/23 0120 01/26/23 0122 01/26/23 0803  BP: 111/82 107/72  105/74  Pulse: 77 86  74  Resp: 19 18  18   Temp: 97.7 F (36.5 C) 98.2 F (36.8 C)  97.9 F (36.6 C)  TempSrc: Oral Oral  Oral   SpO2: 98% 92% 95% 94%  Weight:      Height:        Data Reviewed:    Latest Ref Rng & Units 01/24/2023    6:00 AM 01/22/2023    5:06 AM 01/20/2023    4:08 AM  BMP  Glucose 70 - 99 mg/dL 161  096  045   BUN 8 - 23 mg/dL 19  19  20    Creatinine 0.61 - 1.24 mg/dL 4.09  8.11  9.14   Sodium 135 - 145 mmol/L 134  131  132   Potassium 3.5 - 5.1 mmol/L 4.1  3.8  4.6   Chloride 98 - 111 mmol/L 99  97  101   CO2 22 - 32 mmol/L 26  25  22    Calcium 8.9 - 10.3 mg/dL 9.4  9.2  8.6        Latest Ref Rng & Units 01/22/2023    5:06 AM 01/20/2023    4:08 AM 01/19/2023    5:45 AM  CBC  WBC 4.0 - 10.5 K/uL 9.9  14.0  11.5   Hemoglobin 13.0 - 17.0 g/dL 09.8  11.9  14.7   Hematocrit 39.0 - 52.0 % 32.8  31.7  30.9   Platelets 150 - 400 K/uL 417  406  376       Author: Loyce Dys, MD 01/26/2023 4:28 PM  For on call review [email or web address].

## 2023-01-26 NOTE — Progress Notes (Signed)
Subjective: 7 Days Post-Op Procedure(s) (LRB): HIP REVISION, HEAD AND LINER EXCHANGE (Left) IRRIGATION AND DEBRIDEMENT THIGH (Left) Patient reports pain as mild to the left hip.  Denies any issues this morning in the left leg. Patient is well, and has had no acute complaints or problems Denies any CP, SOB, ABD pain. No N/V. We will continue therapy today.   Objective: Vital signs in last 24 hours: Temp:  [97.7 F (36.5 C)-98.2 F (36.8 C)] 98.2 F (36.8 C) (12/30 0120) Pulse Rate:  [77-86] 86 (12/30 0120) Resp:  [17-19] 18 (12/30 0120) BP: (100-116)/(64-82) 107/72 (12/30 0120) SpO2:  [92 %-98 %] 95 % (12/30 0122)  Intake/Output from previous day: 12/29 0701 - 12/30 0700 In: 200 [IV Piggyback:200] Out: 500 [Urine:500] Intake/Output this shift: No intake/output data recorded.  No results for input(s): "HGB" in the last 72 hours.  No results for input(s): "WBC", "RBC", "HCT", "PLT" in the last 72 hours.  Recent Labs    01/24/23 0600  NA 134*  K 4.1  CL 99  CO2 26  BUN 19  CREATININE 0.76  GLUCOSE 113*  CALCIUM 9.4   No results for input(s): "LABPT", "INR" in the last 72 hours.   EXAM General - Patient is sleepy during provider encounter. Able to answer question but goes back to  sleep. Extremity - Neurovascular intact Sensation intact distally Intact pulses distally Dorsiflexion/Plantar flexion intact No cellulitis present Compartment soft Dressing - Drain sites and bandages look clean without any appreciable drainage. Prevena left intact with appropriate seal. Motor Function - intact, moving foot and toes well on exam.   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)    HLD (hyperlipidemia)    HTN (hypertension)    Schizophrenia (HCC)      Assessment/Plan:   7 Days Post-Op Procedure(s) (LRB): HIP REVISION, HEAD AND LINER EXCHANGE (Left) IRRIGATION AND DEBRIDEMENT THIGH (Left) Principal Problem:   SIRS (systemic inflammatory response syndrome) (HCC) Active Problems:   Hypokalemia   Schizophrenia (HCC)   Chronic obstructive pulmonary disease (HCC)   HTN (hypertension)   AF (paroxysmal atrial fibrillation) (HCC)   Fall at home, initial encounter   Hyponatremia   Prolonged QT interval   Electrolyte disturbance   Hypophosphatemia   Acute metabolic encephalopathy   Left shoulder pain   Hypomagnesemia   Hypotension   E coli bacteremia   Sepsis without acute organ dysfunction (HCC)   Abscess of hip, left   Prosthetic joint infection (HCC)   Prosthetic joint infection of left hip (HCC)  Estimated body mass index is 24.27 kg/m as calculated from the following:   Height as of this encounter: 5\' 11"  (1.803 m).   Weight as of this encounter: 78.9 kg. Advance diet Up with therapy  Pain well controlled Labs show most recent WBC of 9.9 on 01/22/2023. VSS Continue with IV abx per ID Drain sites look clean without any appreciable drainage into bandages Plan to remove prevena and apply honeycomb dressing 7 days after discharge  CM to assist with discharge to SNF - placement looks to be at [facility]  Follow up with [facility] ortho in 7-10 days. Lovenox 40 mg SQ daily x 14 days at discharge  DVT Prophylaxis - Lovenox, TED hose, and SCDs Weight-Bearing as  tolerated to left leg  J. Horris Latino, PA-C [facility] 01/26/2023, 7:59 AM

## 2023-01-26 NOTE — Progress Notes (Signed)
PT Cancellation Note  Patient Details Name: Edder Thomlinson MRN: [id number] DOB: [birth year]   Cancelled Treatment:    Reason Eval/Treat Not Completed: Other (comment). Pt sleeping soundly, PT to re-attempt as able.    Olga Coaster PT, DPT 9:41 AM,01/26/23

## 2023-01-26 NOTE — Discharge Instructions (Signed)
Diet: As you were doing prior to hospitalization   Shower:  May shower but keep the wounds dry, use an occlusive plastic wrap, NO SOAKING IN TUB.  If the bandage gets wet, change with a clean dry gauze.  Dressing:  Leave dressings intact until first appointment.  Woundvac can be removed and replaced with dry dressing in 7 days.  Activity:  Increase activity slowly as tolerated, but follow the weight bearing instructions below.  No lifting or driving for 6 weeks.  Weight Bearing:   Weight bearing as tolerated to left lower extremity  To prevent constipation: you may use a stool softener such as -  Colace (over the counter) 100 mg by mouth twice a day  Drink plenty of fluids (prune juice may be helpful) and high fiber foods Miralax (over the counter) for constipation as needed.    Itching:  If you experience itching with your medications, try taking only a single pain pill, or even half a pain pill at a time.  You may take up to 10 pain pills per day, and you can also use benadryl over the counter for itching or also to help with sleep.   Precautions:  If you experience chest pain or shortness of breath - call 911 immediately for transfer to the hospital emergency department!!  If you develop a fever greater that 101 F, purulent drainage from wound, increased redness or drainage from wound, or calf pain-Call [facility]                                              Follow- Up Appointment:  Please call for an appointment to be seen in 2 weeks at [facility]

## 2023-01-26 NOTE — Progress Notes (Addendum)
Physical Therapy Treatment Patient Details Name: Akiel Mares MRN: [id number] DOB: 1950-04-25 Today's Date: 01/27/2023   History of Present Illness Pt is a 72 y.o. male with PMHx: HTN, HLD, COPD, asthma, GERD, depression, anxiety, schizophrenia, HBV, tobacco abuse, who presents with AMS, fever and fall.  MD assessment includes: encephalopathy, sepsis secondary to ecoli, frequent falls, hyponatremia, and left hip septic arthritis/prosthetic joint infection now s/p left hip and thigh irrigation and debridement with revision head and liner exchange with placement of custom made antibiotic beads on 01/19/23.    PT Comments  Pt alert but did need repetition of commands and cues to maximize pt participation and attention to task. Pt reluctant for mobility today but with encouragement agreeable to mobilize. Supine to sit with modA, limited by LLE pain. Sit <> stand with minA and RW, and step pivot to recliner initially CGA but minA required for safe descent into chair (poor eccentric control). Further ambulation deferred due to lunch, pt fatigue. PT/pt also reviewed posterior hip precautions and benefits of further mobility. The patient would benefit from further skilled PT intervention to continue to progress towards goals as able.    If plan is discharge home, recommend the following: A little help with walking and/or transfers;A little help with bathing/dressing/bathroom;Assistance with cooking/housework;Supervision due to cognitive status;Assist for transportation;Direct supervision/assist for medications management;Help with stairs or ramp for entrance   Can travel by private vehicle     No  Equipment Recommendations   (defer to next venue)    Recommendations for Other Services       Precautions / Restrictions Precautions Precautions: Fall;Posterior Hip Precaution Booklet Issued: Yes (comment) Precaution Comments: wound vac Restrictions Weight Bearing Restrictions Per Provider Order:  Yes LLE Weight Bearing Per Provider Order: Weight bearing as tolerated Other Position/Activity Restrictions: wound vac     Mobility  Bed Mobility Overal bed mobility: Needs Assistance Bed Mobility: Supine to Sit     Supine to sit: Mod assist, Used rails, HOB elevated          Transfers Overall transfer level: Needs assistance Equipment used: Rolling walker (2 wheels) Transfers: Sit to/from Stand Sit to Stand: Contact guard assist, Min assist           General transfer comment: first attempt pt almost able to come up into standing, but with second attempt did need minA to come up fully    Ambulation/Gait Ambulation/Gait assistance: Contact guard assist Gait Distance (Feet): 2 Feet Assistive device: Rolling walker (2 wheels)         General Gait Details: able to take several steps to recliner   Stairs             Wheelchair Mobility     Tilt Bed    Modified Rankin (Stroke Patients Only)       Balance Overall balance assessment: Needs assistance Sitting-balance support: Feet supported, Bilateral upper extremity supported Sitting balance-Leahy Scale: Fair     Standing balance support: During functional activity, Bilateral upper extremity supported, Reliant on assistive device for balance Standing balance-Leahy Scale: Poor                              Cognition Arousal: Alert Behavior During Therapy: Flat affect                                   General Comments: more alert than  last session but still delayed processing and increased time/repetition needed        Exercises Other Exercises Other Exercises: Posterior hip precaution education Other Exercises: heel slides x10, ankle pumps, LAQ x10 bilaterally, multimodal cues needed throughout to maintain technique and attention task    General Comments        Pertinent Vitals/Pain Pain Assessment Pain Assessment: Faces Pain Location: LLE Pain Descriptors /  Indicators: Grimacing, Guarding    Home Living                          Prior Function            PT Goals (current goals can now be found in the care plan section) Progress towards PT goals: Progressing toward goals    Frequency    Min 1X/week (frequency updated per pt needs)      PT Plan      Co-evaluation              AM-PAC PT "6 Clicks" Mobility   Outcome Measure  Help needed turning from your back to your side while in a flat bed without using bedrails?: A Little Help needed moving from lying on your back to sitting on the side of a flat bed without using bedrails?: A Little Help needed moving to and from a bed to a chair (including a wheelchair)?: A Little Help needed standing up from a chair using your arms (e.g., wheelchair or bedside chair)?: A Little Help needed to walk in hospital room?: A Little Help needed climbing 3-5 steps with a railing? : Total 6 Click Score: 16    End of Session   Activity Tolerance: Patient tolerated treatment well Patient left: in chair;with chair alarm set;with call bell/phone within reach Nurse Communication: Mobility status PT Visit Diagnosis: Muscle weakness (generalized) (M62.81);Other abnormalities of gait and mobility (R26.89)     Time: 1013-1030 PT Time Calculation (min) (ACUTE ONLY): 17 min  Charges:    $Therapeutic Exercise: 8-22 mins PT General Charges $$ ACUTE PT VISIT: 1 Visit                    Olga Coaster PT, DPT 10:41 AM,01/27/23

## 2023-01-26 NOTE — Progress Notes (Signed)
Mobility Specialist - Progress Note   01/26/23 1522  Mobility  Activity Transferred from chair to bed  Level of Assistance Contact guard assist, steadying assist  Assistive Device Front wheel walker  Distance Ambulated (ft) 4 ft  Activity Response Tolerated well  Mobility visit 1 Mobility  Mobility Specialist Start Time (ACUTE ONLY) 1511  Mobility Specialist Stop Time (ACUTE ONLY) 1520  Mobility Specialist Time Calculation (min) (ACUTE ONLY) 9 min   Pt sitting in the recliner upon entry, utilizing RA. Pt STS to RW MinG and transferred to bed via SPT with CGA. Pt left supine with alarm set and needs within reach.  Zetta Bills Mobility Specialist 01/26/23 3:25 PM

## 2023-01-26 NOTE — Progress Notes (Signed)
Order noted for PICC insertion. Unable to reach POA David Stall by phone at this time. Songster RN notified.

## 2023-01-26 NOTE — Treatment Plan (Signed)
Diagnosis: Ecoli bacteremia Left PJI of hip Baseline Creatinine <1    Allergies  Allergen Reactions   Shellfish-Derived Products Hives    OPAT Orders Discharge antibiotics: Ceftriaxone 2 grams Iv every 24 hours Duration: 6 weeks End Date: 03/04/23 ( Feb 5)  [facility] Care Per Protocol:  Labs weekly while on IV antibiotics: _X_ CBC with differential  _X_ CMP _X_ CRP _X_ ESR   _X_ Please pull PIC at completion of IV antibiotics   Fax weekly lab results  promptly to [phone]  Clinic Follow Up Appt: with Dr.Takiyah Bohnsack 03/03/23 at 11.45Am  Call [phone] with any critical value or questions

## 2023-01-26 NOTE — Plan of Care (Signed)

## 2023-01-26 NOTE — Progress Notes (Signed)
Attempted to contact POA again unsuccessfully. Songster RN aware.

## 2023-01-27 MED ORDER — SODIUM CHLORIDE 0.9% FLUSH: 10.0000 mL | INTRAVENOUS | Status: DC | PRN

## 2023-01-27 MED ORDER — CHLORHEXIDINE GLUCONATE CLOTH 2 % EX PADS
6.0000 | MEDICATED_PAD | Freq: Every day | CUTANEOUS | Status: DC
  Administered 2023-01-27 – 2023-01-29 (×3): 6 via TOPICAL

## 2023-01-27 MED ORDER — SODIUM CHLORIDE 0.9% FLUSH
10.0000 mL | Freq: Two times a day (BID) | INTRAVENOUS | Status: DC
  Administered 2023-01-27 – 2023-01-29 (×4): 10 mL

## 2023-01-27 NOTE — Progress Notes (Signed)
 Progress Note   Patient: Adrian Gillespie FMW:[id number] DOB: 1950/05/12 DOA: 01/13/2023     14 DOS: the patient was seen and examined on 01/27/2023      HPI: Ardell Makarewicz is a 72 y.o. male with medical history significant of HTN, HLD, COPD, asthma, GERD, depression, anxiety, schizophrenia, HBV, tobacco abuse, who presents to ED from assisted living facility with AMS, fever and fall.    Hospital course / significant events:  12/17: admitted to hospitalist service, sepsis 12/18: EEG, MRI brain no acute concerns 12/19: BCx (+)Ecoli, ID consulted, plan further imaging to delineate source. Psychiatry consulted for med management 12/20: Afib RVR, resolved. Started low dose beta blocker. CT abd/pelv concern for discitis L3/L4, noted large L hip effusion 12/21: MRI L spine no discitis/osteo 12/22: aspiration L hip effusion w/ concern for purulent fluid, ortho plan for OR tomorrow  12/23: to OR for joint washout L hip. 12/24-12/26: stable postop, leaving drains in place for now. Will need long term IV abx so SNF placement is underway  12/27-12/29: drains out 12/27 (Fri), placement pending, here thru the weekend      Consultants:  Neurology - informal discussion 12/18 w/ recs EEG and MRI, avoid cefepime   Infectious disease  Psychiatry  Orthopedics    Procedures/Surgeries: 01/18/23: L hip/thigh abscess aspiration w/ drain placement  01/19/23: Left hip and thigh irrigation and debridement with revision head and liner exchange with placement of custom made antibiotic beads      ASSESSMENT & PLAN:    E coli bacteremia No abdominal pain or diarrhea to suggest intra-abdominal pathology though patient unable to provide accurate history. Not retaining urine - PVR is 0. CT abdomen no intra-abdominal infection. Denied hip pain but CT of abdomen incidentally showed large left hip effusion and IR aspiration revealed purulent fluid in the left hip tracking into the thigh. S/P Debridement and head and  liner replacement  continue ceftriaxone  - will need 6 weeks IV abx per ID last day 03/04/2023 Patient awaiting PICC line placement I have discussed the case with infectious disease    Left hip septic arthritis Large effusion, incidental on CT. IR aspiration encountered purulent fluid in the left hip tracking into the [facility] performed wash-out 12/23  Orthopedics following  WBAT LLE Remove provena and apply honeycomb dressing 7 days after discharge  Follow up in office with [facility] in 2 weeks Lovenox  40 mg SQ daily x 14 days at discharge   Subacute compression fracture On MRI. CT showed possible signs infection but none seen on mri Pain control as needed   Urinary retention Foley out  In/out cath as needed   Acute encephalopathy w/ seizure concern, seizures reasonably ruled out  Encephalopathy more likely metabolic d/t sepsis - improved/resolved Report of fall day prior to admission, shaking, and confusion afterward that has now resolved. Discussed w/ neuro, hard to tell what's going on here. They advised mri (nothing acute) and eeg (no seizure-like activity).  clinical monitoring   Frequent falls Likely multifactorial PT consulteded, thinks he is close to his baseline, advising return to ALF though will need to be re-evaluated after surgery   Paroxysmal a-fib With rvr morning of 12/20, resolved. Chads2vasc 1 not anticoagulated, nor is he on a rate/rhythm control agent low-dose bb. Dig is an option but not a great one. Currently tolerating coreg  but soft BP.   Hyponatremia Likely hypovolemic as has resolved  with hydration Relatively stable off fluids but still low, encourage po hydration  monitor BMP  Hypokalemia hypomagnesmia replete and monitor   QTC prolongation Likely 2/2 psych meds exacerbated by electrolyte abnormalities, has improved on most recent EKG treat electrolyte abnormalities as above   COPD Quiescent home inhalers   Hypotension Likely  multifactorial. Meds likely contributing but needs beta blocker for rate control given recent RVR.  continue midodrine  beta blocker rate control    Schizophrenia Ck wnl. Intermittent agitation, none the last few days continue home haldol  on monthly invega as well trazodone  at bedtime   Chronic pain stable Pain control as needed     DVT prophylaxis: lovenox    IV fluids: no continuous IV fluids  Nutrition: regular diet Central lines / invasive devices:  wound vac L hip    Code Status: FULL CODE   TOC needs: SNF - [facility] is pending as of 01/25/23  Barriers to dispo / significant pending items: continuing IV abx, will not be able to go back to ALF on IV abx, SNF placement pending but expect discharge once auth is complete and IV abx orders are confirmed/finalized      Subjective / Brief ROS:  Patient seen and examined at bedside this morning Awaiting placement of PICC line and subsequent discharge to skilled nursing facility [facility] manager coordinating this Denies nausea vomiting abdominal pain   Family Communication: called sister 01/25/23 5:16 PM all questions answered    Examination:  Physical Exam Constitutional:      General: He is not in acute distress. Cardiovascular:     Rate and Rhythm: Normal rate and regular rhythm.  Pulmonary:     Effort: Pulmonary effort is normal.     Breath sounds: Normal breath sounds.  Musculoskeletal:     Right lower leg: No edema.     Left lower leg: No edema.  Skin:    General: Skin is warm and dry.  Neurological:     General: No focal deficit present.     Mental Status: He is alert. Mental status is at baseline.  Psychiatric:        Mood and Affect: Mood normal.        Behavior: Behavior normal.      Data Reviewed: Vitals:   01/26/23 1715 01/26/23 2008 01/27/23 0332 01/27/23 0933  BP: 110/68 111/73 (!) 87/72 112/77  Pulse: 72 76 67 64  Resp:  18 18 18   Temp: 98 F (36.7 C) 97.6 F (36.4 C) (!) 97.5 F (36.4 C) 98 F (36.7  C)  TempSrc: Oral Oral Oral   SpO2: 94% 93% 95% 94%  Weight:      Height:          Latest Ref Rng & Units 01/24/2023    6:00 AM 01/22/2023    5:06 AM 01/20/2023    4:08 AM  BMP  Glucose 70 - 99 mg/dL 886  888  866   BUN 8 - 23 mg/dL 19  19  20    Creatinine 0.61 - 1.24 mg/dL 9.23  9.35  9.03   Sodium 135 - 145 mmol/L 134  131  132   Potassium 3.5 - 5.1 mmol/L 4.1  3.8  4.6   Chloride 98 - 111 mmol/L 99  97  101   CO2 22 - 32 mmol/L 26  25  22    Calcium 8.9 - 10.3 mg/dL 9.4  9.2  8.6         Latest Ref Rng & Units 01/22/2023    5:06 AM 01/20/2023    4:08 AM 01/19/2023    5:45  AM  CBC  WBC 4.0 - 10.5 K/uL 9.9  14.0  11.5   Hemoglobin 13.0 - 17.0 g/dL 88.9  89.6  89.5   Hematocrit 39.0 - 52.0 % 32.8  31.7  30.9   Platelets 150 - 400 K/uL 417  406  376      Author: Drue ONEIDA Potter, MD 01/27/2023 3:11 PM  For on call review [email or web address].

## 2023-01-27 NOTE — Progress Notes (Signed)
Attempted to contact POA, Lindell Spar, to get PICC consent but unsuccessful. RN made aware thru secure chat.

## 2023-01-27 NOTE — Progress Notes (Signed)
 OT Cancellation Note  Patient Details Name: Adrian Gillespie MRN: [id number] DOB: 1951/01/20   Cancelled Treatment:    Reason Eval/Treat Not Completed: Patient at procedure or test/ unavailable. Attempted to see, pt in room for sterile procedure, will hold and re-attempt as able.   Elston Slot, M.S. OTR/L  01/27/23, 2:57 PM  ascom [phone]

## 2023-01-27 NOTE — Plan of Care (Signed)

## 2023-01-27 NOTE — Progress Notes (Signed)
 Peripherally Inserted Central Catheter Placement  The IV Nurse has discussed with the patient and/or persons authorized to consent for the patient, the purpose of this procedure and the potential benefits and risks involved with this procedure.  The benefits include less needle sticks, lab draws from the catheter, and the patient may be discharged home with the catheter. Risks include, but not limited to, infection, bleeding, blood clot (thrombus formation), and puncture of an artery; nerve damage and irregular heartbeat and possibility to perform a PICC exchange if needed/ordered by physician.  Alternatives to this procedure were also discussed.  Bard Power PICC patient education guide, fact sheet on infection prevention and patient information card has been provided to patient /or left at bedside.    PICC Placement Documentation  PICC Single Lumen 01/27/23 Right Brachial 42 cm 0 cm (Active)  Indication for Insertion or Continuance of Line Prolonged intravenous therapies 01/27/23 1531  Exposed Catheter (cm) 0 cm 01/27/23 1531  Site Assessment Clean, Dry, Intact 01/27/23 1531  Line Status Flushed;Saline locked;Blood return noted 01/27/23 1531  Dressing Type Transparent;Securing device 01/27/23 1531  Dressing Status Antimicrobial disc in place;Clean, Dry, Intact 01/27/23 1531  Line Care Connections checked and tightened 01/27/23 1531  Line Adjustment (NICU/IV Team Only) Yes 01/27/23 1531  Dressing Intervention New dressing;Adhesive placed at insertion site (IV team only);Other (Comment) 01/27/23 1531  Dressing Change Due 02/03/23 01/27/23 1531    Patient's POA, Gwenn Blush, signed PICC consent via telephone. Verified by 2 RNs.   Seraphim Affinito 01/27/2023, 3:32 PM

## 2023-01-27 NOTE — Progress Notes (Signed)
 PHARMACY CONSULT NOTE FOR:  OUTPATIENT  PARENTERAL ANTIBIOTIC THERAPY (OPAT)  Indication:  E. Coli bacteremia and left hip prosthetic joint infection Regimen: Ceftriaxone  2gm IV q24h End date: 03/04/2023  Labs - Once weekly:  CBC/D, CMP, ESR and CRP Fax weekly lab results  promptly to [phone] Please pull PIC at completion of IV antibiotics Method of administration: IV Push  IV antibiotic discharge orders are pended. To discharging provider:  please sign these orders via discharge navigator,  Select New Orders & click on the button choice - Manage This Unsigned Work.     Thank you for allowing pharmacy to be a part of this patient's care.  Jahnasia Tatum, PharmD, BCPS, BCIDP Work Cell: [phone] 01/27/2023 8:16 AM

## 2023-01-27 NOTE — TOC Progression Note (Signed)
 Transition of Care [facility]) - Progression Note    Patient Details  Name: Trei Schoch MRN: [id number] Date of Birth: 1950-08-31  [facility]) CM/SW Contact  Lauraine JAYSON Carpen, LCSW Phone Number: 01/27/2023, 3:17 PM  Clinical Narrative:   EMS crew chief said it's too late in the day to transport patient to [place].  Expected Discharge Plan: Assisted Living (with home health) Barriers to Discharge: Continued Medical Work up  Expected Discharge Plan and Services       Living arrangements for the past 2 months: Assisted Living Facility                             [facility] Agency: [facility] Date [facility] Agency Contacted: 01/15/23   Representative spoke with at [facility] Agency: Dorothe Essex   Social Determinants of Health (SDOH) Interventions SDOH Screenings   Food Insecurity: No Food Insecurity (01/15/2023)  Housing: Low Risk  (01/15/2023)  Transportation Needs: No Transportation Needs (01/15/2023)  Utilities: Not At Risk (01/15/2023)  Financial Resource Strain: Medium Risk (06/26/2022)   Received from [facility], [facility]  Physical Activity: Sufficiently Active (06/26/2022)   Received from [facility], [facility]  Social Connections: Moderately Isolated (01/27/2023)  Stress: No Stress Concern Present (06/26/2022)   Received from [facility], [facility]  Tobacco Use: High Risk (01/19/2023)  Health Literacy: High Risk (06/26/2022)   Received from [facility], [facility]    Readmission Risk Interventions    01/16/2023    1:02 PM 01/15/2023    9:35 AM 09/20/2022    3:20 PM  Readmission Risk Prevention Plan  Transportation Screening Complete  Complete  PCP or Specialist Appt within 3-5 Days  Complete Complete  HRI or Home Care Consult Complete Complete Complete  Social Work Consult for Recovery Care Planning/Counseling Complete Complete Complete  Palliative Care Screening Complete Not Applicable Not Applicable   Medication Review Oceanographer) Complete  Complete

## 2023-01-28 NOTE — TOC Progression Note (Signed)
 Transition of Care [facility]) - Progression Note    Patient Details  Name: Adrian Gillespie MRN: [id number] Date of Birth: 1950/09/06  Transition of Care [facility]) CM/SW Contact  Corean ONEIDA Haddock, RN Phone Number: 01/28/2023, 1:01 PM  Clinical Narrative:     Per MD patient medically ready for discharge.  Per Darice Dame chief no out of county EMS transports today Auth expires today, will need to check with insurance tomorrow to see if there is a 24 hour grace period   Expected Discharge Plan: Assisted Living (with home health) Barriers to Discharge: Continued Medical Work up  Expected Discharge Plan and Services       Living arrangements for the past 2 months: Assisted Living Facility Expected Discharge Date: 01/28/23                           [facility] Agency: [facility] Date [facility] Agency Contacted: 01/15/23   Representative spoke with at [facility] Agency: Dorothe Essex   Social Determinants of Health (SDOH) Interventions SDOH Screenings   Food Insecurity: No Food Insecurity (01/15/2023)  Housing: Low Risk  (01/15/2023)  Transportation Needs: No Transportation Needs (01/15/2023)  Utilities: Not At Risk (01/15/2023)  Financial Resource Strain: Medium Risk (06/26/2022)   Received from [facility], [facility]  Physical Activity: Sufficiently Active (06/26/2022)   Received from [facility], [facility]  Social Connections: Moderately Isolated (01/27/2023)  Stress: No Stress Concern Present (06/26/2022)   Received from [facility], [facility] Care  Tobacco Use: High Risk (01/19/2023)  Health Literacy: High Risk (06/26/2022)   Received from [facility], [facility] Care    Readmission Risk Interventions    01/16/2023    1:02 PM 01/15/2023    9:35 AM 09/20/2022    3:20 PM  Readmission Risk Prevention Plan  Transportation Screening Complete  Complete  PCP or Specialist Appt within 3-5 Days  Complete Complete  HRI or Home Care Consult Complete Complete Complete   Social Work Consult for Recovery Care Planning/Counseling Complete Complete Complete  Palliative Care Screening Complete Not Applicable Not Applicable  Medication Review Oceanographer) Complete  Complete

## 2023-01-28 NOTE — Plan of Care (Signed)
  Problem: Pain Management: Goal: General experience of comfort will improve Outcome: Progressing   Problem: Safety: Goal: Ability to remain free from injury will improve Outcome: Progressing   Problem: Skin Integrity: Goal: Risk for impaired skin integrity will decrease Outcome: Progressing

## 2023-01-28 NOTE — Plan of Care (Signed)

## 2023-01-28 NOTE — Progress Notes (Addendum)
 Progress Note   Patient: Adrian Gillespie FMW:[id number] DOB: 1950/03/23 DOA: 01/13/2023     15 DOS: the patient was seen and examined on 01/28/2023     HPI: Adrian Gillespie is a 73 y.o. male with medical history significant of HTN, HLD, COPD, asthma, GERD, depression, anxiety, schizophrenia, HBV, tobacco abuse, who presents to ED from assisted living facility with AMS, fever and fall.    Hospital course / significant events:  12/17: admitted to hospitalist service, sepsis 12/18: EEG, MRI brain no acute concerns 12/19: BCx (+)Ecoli, ID consulted, plan further imaging to delineate source. Psychiatry consulted for med management 12/20: Afib RVR, resolved. Started low dose beta blocker. CT abd/pelv concern for discitis L3/L4, noted large L hip effusion 12/21: MRI L spine no discitis/osteo 12/22: aspiration L hip effusion w/ concern for purulent fluid, ortho plan for OR tomorrow  12/23: to OR for joint washout L hip. 12/24-12/26: stable postop, leaving drains in place for now. Will need long term IV abx so SNF placement is underway  12/27-12/29: drains out 12/27 (Fri), placement pending, here thru the weekend      Consultants:  Neurology - informal discussion 12/18 w/ recs EEG and MRI, avoid cefepime   Infectious disease  Psychiatry  Orthopedics    Procedures/Surgeries: 01/18/23: L hip/thigh abscess aspiration w/ drain placement  01/19/23: Left hip and thigh irrigation and debridement with revision head and liner exchange with placement of custom made antibiotic beads      ASSESSMENT & PLAN:    E coli bacteremia No abdominal pain or diarrhea to suggest intra-abdominal pathology though patient unable to provide accurate history. Not retaining urine - PVR is 0. CT abdomen no intra-abdominal infection. Denied hip pain but CT of abdomen incidentally showed large left hip effusion and IR aspiration revealed purulent fluid in the left hip tracking into the thigh. S/P Debridement and head and liner  replacement  continue ceftriaxone  - will need 6 weeks IV abx per ID last day 03/04/2023 PICC line has been placed TOC working on placement I have discussed the case with infectious disease    Left hip septic arthritis Large effusion, incidental on CT. IR aspiration encountered purulent fluid in the left hip tracking into the [facility] performed wash-out 12/23  Orthopedics following  WBAT LLE Remove provena and apply honeycomb dressing 7 days after discharge  Follow up in office with [facility] in 2 weeks Lovenox  40 mg SQ daily x 14 days at discharge   Subacute compression fracture On MRI. CT showed possible signs infection but none seen on mri Pain control as needed   Urinary retention Foley out  In/out cath as needed   Acute encephalopathy w/ seizure concern, seizures reasonably ruled out  Encephalopathy more likely metabolic d/t sepsis - improved/resolved Report of fall day prior to admission, shaking, and confusion afterward that has now resolved. Discussed w/ neuro, hard to tell what's going on here. They advised mri (nothing acute) and eeg (no seizure-like activity).  clinical monitoring   Frequent falls Likely multifactorial PT consulteded, thinks he is close to his baseline, advising return to ALF though will need to be re-evaluated after surgery   Paroxysmal a-fib With rvr morning of 12/20, resolved. Chads2vasc 1 not anticoagulated, nor is he on a rate/rhythm control agent Currently on low-dose Coreg  given borderline hypotension  Hyponatremia Likely hypovolemic as has resolved  with hydration Relatively stable off fluids but still low, encourage po hydration  monitor BMP   Hypokalemia hypomagnesmia replete and monitor  QTC prolongation Likely 2/2 psych meds exacerbated by electrolyte abnormalities, has improved on most recent EKG treat electrolyte abnormalities as above   COPD Quiescent home inhalers   Hypotension Likely multifactorial. Meds likely  contributing but needs beta blocker for rate control given recent RVR.  continue midodrine  beta blocker rate control    Schizophrenia Ck wnl. Intermittent agitation, none the last few days continue home haldol  on monthly invega as well trazodone  at bedtime   Chronic pain stable Pain control as needed     DVT prophylaxis: lovenox     Central lines / invasive devices:  wound vac L hip    Code Status: FULL CODE   TOC needs: SNF   Barriers to dispo / significant pending items: continuing IV abx, will not be able to go back to ALF on IV abx, SNF placement pending but expect discharge once auth is complete and IV abx orders are confirmed/finalized      Subjective / Brief ROS:  Patient seen and examined at bedside this morning Awaiting placement of PICC line and subsequent discharge to skilled nursing facility [facility] manager coordinating this Denies nausea vomiting abdominal pain   Family Communication: called sister 01/25/23 5:16 PM all questions answered    Examination:  Physical Exam Constitutional:      General: He is not in acute distress. Cardiovascular:     Rate and Rhythm: Normal rate and regular rhythm.  Pulmonary:     Effort: Pulmonary effort is normal.     Breath sounds: Normal breath sounds.  Musculoskeletal:     Right lower leg: No edema.     Left lower leg: No edema.  Skin:    General: Skin is warm and dry.  Neurological:     General: No focal deficit present.     Mental Status: He is alert. Mental status is at baseline.  Psychiatric:        Mood and Affect: Mood normal.        Behavior: Behavior normal.      Data Reviewed:     Latest Ref Rng & Units 01/24/2023    6:00 AM 01/22/2023    5:06 AM 01/20/2023    4:08 AM  BMP  Glucose 70 - 99 mg/dL 886  888  866   BUN 8 - 23 mg/dL 19  19  20    Creatinine 0.61 - 1.24 mg/dL 9.23  9.35  9.03   Sodium 135 - 145 mmol/L 134  131  132   Potassium 3.5 - 5.1 mmol/L 4.1  3.8  4.6   Chloride 98 - 111 mmol/L 99   97  101   CO2 22 - 32 mmol/L 26  25  22    Calcium 8.9 - 10.3 mg/dL 9.4  9.2  8.6     Vitals:   01/28/23 0415 01/28/23 0844 01/28/23 1200 01/28/23 1553  BP: 102/70 96/69 106/75 109/82  Pulse: 78 76  69  Resp: 18     Temp: 97.7 F (36.5 C) 97.7 F (36.5 C)  97.9 F (36.6 C)  TempSrc: Oral Oral  Oral  SpO2: 94% 94%  96%  Weight:      Height:          Latest Ref Rng & Units 01/22/2023    5:06 AM 01/20/2023    4:08 AM 01/19/2023    5:45 AM  CBC  WBC 4.0 - 10.5 K/uL 9.9  14.0  11.5   Hemoglobin 13.0 - 17.0 g/dL 88.9  89.6  89.5  Hematocrit 39.0 - 52.0 % 32.8  31.7  30.9   Platelets 150 - 400 K/uL 417  406  376      Author: Drue ONEIDA Potter, MD 01/28/2023 5:48 PM  For on call review [email or web address]

## 2023-01-29 ENCOUNTER — Ambulatory Visit: Payer: Medicare Other | Admitting: Nurse Practitioner

## 2023-01-29 MED ORDER — CEFTRIAXONE IV (FOR PTA / DISCHARGE USE ONLY): 2.0000 g | INTRAVENOUS | 0 refills | Status: AC

## 2023-01-29 NOTE — Discharge Summary (Signed)
 Physician Discharge Summary   Patient: Nayan Proch MRN: [id number] DOB: [birth year]  Admit date:     01/13/2023  Discharge date: 01/29/23  Discharge Physician: Drue ONEIDA Potter   PCP: Jesus Griffes, MD   Recommendations at discharge:  Follow-up with orthopedist, infectious disease  Discharge Diagnoses: E coli bacteremia Left hip septic arthritis Subacute compression fracture Urinary retention Acute encephalopathy w/ seizure concern, seizures reasonably ruled out  Encephalopathy more likely metabolic d/t sepsis - improved/resolved Frequent falls Paroxysmal a-fib  Hyponatremia Hypokalemia hypomagnesmia QTC prolongation COPD Hypotension Schizophrenia Chronic pain  Hospital Course: Donte Lenzo is a 73 y.o. male with medical history significant of HTN, HLD, COPD, asthma, GERD, depression, anxiety, schizophrenia, HBV, tobacco abuse, who presents to ED from assisted living facility with AMS, fever and fall.    Hospital course / significant events:  12/17: admitted to hospitalist service, sepsis 12/18: EEG, MRI brain no acute concerns 12/19: BCx (+)Ecoli, ID consulted, plan further imaging to delineate source. Psychiatry consulted for med management 12/20: Afib RVR, resolved. Started low dose beta blocker. CT abd/pelv concern for discitis L3/L4, noted large L hip effusion 12/21: MRI L spine no discitis/osteo 12/22: aspiration L hip effusion w/ concern for purulent fluid, ortho plan for OR tomorrow  12/23: to OR for joint washout L hip. 12/24-12/26: stable postop, leaving drains in place for now. Will need long term IV abx so SNF placement is underway  12/27-01/29/2023: drains out 12/27 (Fri), placement pending,  Orthopedics recommended Lovenox  40 mg subcutaneous to be taken for 14 days at discharge. WBAT left lower extremity Infectious disease recommended IV ceftriaxone  to be taken until 03/04/2023   Consultants:  Neurology - informal discussion 12/18 w/ recs EEG and MRI,  avoid cefepime   Infectious disease  Psychiatry  Orthopedics    Procedures/Surgeries: 01/18/23: L hip/thigh abscess aspiration w/ drain placement  01/19/23: Left hip and thigh irrigation and debridement with revision head and liner exchange with placement of custom made antibiotic beads    Procedures performed: As shown above Disposition: Home Diet recommendation:  Cardiac diet DISCHARGE MEDICATION: Allergies as of 01/29/2023       Reactions   Shellfish-derived Products Hives        Medication List     TAKE these medications    acetaminophen  325 MG tablet Commonly known as: TYLENOL  Take 2 tablets (650 mg total) by mouth every 6 (six) hours as needed for mild pain or moderate pain.   Advair HFA 45-21 MCG/ACT inhaler Generic drug: fluticasone-salmeterol Inhale 2 puffs into the lungs 2 (two) times daily.   albuterol  108 (90 Base) MCG/ACT inhaler Commonly known as: VENTOLIN  HFA Inhale 2 puffs into the lungs every 6 (six) hours as needed for shortness of breath or wheezing.   benztropine  1 MG tablet Commonly known as: COGENTIN  Take 1 mg by mouth daily.   Bevespi Aerosphere 9-4.8 MCG/ACT Aero Generic drug: Glycopyrrolate -Formoterol  Inhale 2 puffs into the lungs at bedtime.   carvedilol  3.125 MG tablet Commonly known as: COREG  Take 1 tablet (3.125 mg total) by mouth 2 (two) times daily with a meal.   cefTRIAXone  IVPB Commonly known as: ROCEPHIN  Inject 2 g into the vein daily. Indication:  E. Coli bacteremia and left hip prosthetic joint infection Last Day of Therapy:  03/04/2023 Labs - Once weekly:  CBC/D, CMP, ESR and CRP Fax weekly lab results  promptly to [phone] Please pull PIC at completion of IV antibiotics Method of administration: IV Push Method of administration may be changed at the discretion  of facility and its pharmacy   docusate sodium  100 MG capsule Commonly known as: COLACE Take 1 capsule (100 mg total) by mouth 2 (two) times daily.    enoxaparin  40 MG/0.4ML injection Commonly known as: LOVENOX  Inject 0.4 mLs (40 mg total) into the skin daily for 14 days.   Fe Fum-Vit C-Vit B12-FA Caps capsule Commonly known as: TRIGELS-F FORTE Take 1 capsule by mouth 2 (two) times daily.   feeding supplement Liqd Take 237 mLs by mouth 2 (two) times daily between meals.   haloperidol  10 MG tablet Commonly known as: HALDOL  Take 10 mg by mouth 2 (two) times daily.   HYDROcodone -acetaminophen  5-325 MG tablet Commonly known as: NORCO/VICODIN Take 1 tablet by mouth every 6 (six) hours as needed for moderate pain (pain score 4-6) (pain score 4-6). What changed:  how much to take when to take this   hydrOXYzine  50 MG capsule Commonly known as: VISTARIL  Take 50 mg by mouth 2 (two) times daily as needed for anxiety.   Invega Sustenna 234 MG/1.5ML injection Generic drug: paliperidone Inject 234 mg into the muscle every 30 (thirty) days.   magnesium  hydroxide 400 MG/5ML suspension Commonly known as: MILK OF MAGNESIA Take 30 mLs by mouth daily as needed for mild constipation.   midodrine  10 MG tablet Commonly known as: PROAMATINE  Take 1 tablet (10 mg total) by mouth 3 (three) times daily with meals.   montelukast  10 MG tablet Commonly known as: SINGULAIR  Take 10 mg by mouth daily.   multivitamin with minerals Tabs tablet Take 1 tablet by mouth daily.   naloxone  4 MG/0.1ML Liqd nasal spray kit Commonly known as: NARCAN  One spray in nostril for opioid reversal if unresponsive   nicotine  21 mg/24hr patch Commonly known as: NICODERM CQ  - dosed in mg/24 hours Place 1 patch (21 mg total) onto the skin daily.   omeprazole 20 MG capsule Commonly known as: PRILOSEC Take 20 mg by mouth daily.   polyethylene glycol 17 g packet Commonly known as: MIRALAX  / GLYCOLAX  Take 17 g by mouth daily.   Spiriva  HandiHaler 18 MCG inhalation capsule Generic drug: tiotropium Place 1 capsule (18 mcg total) into inhaler and inhale daily.    tamsulosin  0.4 MG Caps capsule Commonly known as: FLOMAX  Take 1 capsule (0.4 mg total) by mouth daily after supper.   traZODone  100 MG tablet Commonly known as: DESYREL  Take 300 mg by mouth at bedtime.               Home Infusion Instuctions  (From admission, onward)           Start     Ordered   01/29/23 0000  Home infusion instructions       Question:  Instructions  Answer:  Flushing of vascular access device: 0.9% NaCl pre/post medication administration and prn patency; Heparin 100 u/ml, 5ml for implanted ports and Heparin 10u/ml, 5ml for all other central venous catheters.   01/29/23 1208              Discharge Care Instructions  (From admission, onward)           Start     Ordered   01/26/23 0000  Change dressing on IV access line weekly and PRN  (Home infusion instructions - [facility] )        01/26/23 1418            Contact information for follow-up providers     Charlene Debby BROCKS, PA-C Follow up  on 02/10/2023.   Specialties: Orthopedic Surgery, Emergency Medicine Why: 02/03/23-02/05/23 for staple removal  The earliest appt I could get for Debby Amber PA-C was Tuesday Jan 14th at 10:30.  I did request [place] be put on the cancellation list so he could get in sooner. Contact information: [address] [place] [address] [phone]              Contact information for after-discharge care     Destination     [facility] .   Service: Skilled Nursing Contact information: [address] [place]  [address] [phone]                    Discharge Exam: Fredricka Weights   01/13/23 2200 01/19/23 1126  Weight: 78.9 kg 78.9 kg   Constitutional:      General: He is not in acute distress. Cardiovascular:     Rate and Rhythm: Normal rate and regular rhythm.  Pulmonary:     Effort: Pulmonary effort is normal.     Breath sounds: Normal breath sounds.   Musculoskeletal:     Right lower leg: No edema.     Left lower leg: No edema.  Skin:    General: Skin is warm and dry.  Neurological:     General: No focal deficit present.     Mental Status: He is alert. Mental status is at baseline.  Psychiatric:        Mood and Affect: Mood normal.        Behavior: Behavior normal.   Condition at discharge: good  The results of significant diagnostics from this hospitalization (including imaging, microbiology, ancillary and laboratory) are listed below for reference.   Imaging Studies: US  EKG SITE RITE Result Date: 01/26/2023 If Site Rite image not attached, placement could not be confirmed due to current cardiac rhythm.  DG Pelvis Portable Result Date: 01/19/2023 CLINICAL DATA:  Postop arthroplasty. EXAM: PORTABLE PELVIS 1-2 VIEWS COMPARISON:  CT abdomen pelvis dated 01/16/2023. FINDINGS: No acute fracture or dislocation. The bones are osteopenic. Prior ORIF of the right femur and status post total left hip arthroplasty. A drainage catheter and beads noted over the left hip. A Foley catheter is present. Left hip cutaneous clips. IMPRESSION: Status post total left hip arthroplasty. Electronically Signed   By: Vanetta Chou M.D.   On: 01/19/2023 19:18   IR IMAGE GUIDED FLUID DRAIN BY CATHETER Result Date: 01/18/2023 INDICATION: 73 year old gentleman with bacteremia and multiloculated left hip and thigh abscesses presents to interventional radiology for imaging guided drain placement. EXAM: Ultrasound and fluoroscopy guided left lateral hip abscess drain placement MEDICATIONS: The patient is currently admitted to the hospital and receiving intravenous antibiotics. The antibiotics were administered within an appropriate time frame prior to the initiation of the procedure. ANESTHESIA/SEDATION: None COMPLICATIONS: None immediate. PROCEDURE: Informed written consent was obtained from the patient after a thorough discussion of the procedural risks,  benefits and alternatives. All questions were addressed. Maximal Sterile Barrier Technique was utilized including caps, mask, sterile gowns, sterile gloves, sterile drape, hand hygiene and skin antiseptic. A timeout was performed prior to the initiation of the procedure. Ultrasound examination of the left lateral hip region confirmed presence of large fluid collection. The overlying skin was prepped and draped in the usual sterile fashion. Following local administration, the fluid collection was accessed with 19 gauge Yueh needle using continuous ultrasound guidance. The Yueh catheter was removed over 0.035 inch Amplatz guidewire replaced with 10.2 French  multipurpose pigtail drain. Limited Dyna CT of the region was performed and confirmed appropriate positioning within the collection. 60 mL of purulent material was aspirated and sent for Gram stain and culture. There did not appear to be significant decrease in size of the left anterior hip fluid collection with aspiration of the lateral hip collection. Therefore decision was made to attempt placement of a drain within this collection. The overlying skin was prepped and draped in usual fashion. Following local administration, this collection was accessed with a 19 gauge Yueh needle utilizing continuous ultrasound guidance. The Yueh catheter was removed over 0.035 inch guidewire. However, I was not able to advance a 10 French drain into the collection utilizing fluoroscopic guidance. Hemorrhage was seen along the catheter tract therefore the procedure was abandoned and 15 minutes of manual compression was applied to the site to confirm hemostasis. IMPRESSION: 1. Successful insertion of 10.2 French abscess drain in the left lateral hip collection. 60 mL of purulent material aspirated and sent for Gram stain and culture. 2. Unsuccessful insertion of drain in anterior hip fluid collection. Collection was accessed with 19 gauge Yueh needle, however a drain could not be  advanced into the collection. Electronically Signed   By: Aliene Lloyd M.D.   On: 01/18/2023 20:42   MR Lumbar Spine W Wo Contrast Result Date: 01/17/2023 CLINICAL DATA:  Osteomyelitis, lumbar.  Bacteremia. EXAM: MRI LUMBAR SPINE WITHOUT AND WITH CONTRAST TECHNIQUE: Multiplanar and multiecho pulse sequences of the lumbar spine were obtained without and with intravenous contrast. CONTRAST:  8mL GADAVIST  GADOBUTROL  1 MMOL/ML IV SOLN COMPARISON:  Abdominal CT from yesterday FINDINGS: Segmentation:  5 lumbar type vertebrae Alignment:  Mild levoscoliosis. Vertebrae: T12 compression fracture with marrow edema. Height loss is moderate at approximate 50%. Remote and healed L3 superior endplate fracture with mild height loss. No marrow edema to suggest discitis or osteomyelitis. Hemangioma in the L3 body. Conus medullaris and cauda equina: Conus extends to the T12-L1 level. Conus and cauda equina appear normal. Paraspinal and other soft tissues: No perispinal mass or inflammation. Partially covered collection posterior to the left hip. No change from CT yesterday. Disc levels: T12- L1: Unremarkable. L1-L2: Disc height loss and desiccation with bulge. No neural compression L2-L3: Degenerative facet spurring. Circumferential disc bulging. No neural compression L3-L4: Disc bulging with endplate and facet spurring. Mild right subarticular recess narrowing L4-L5: Disc narrowing with bulging and endplate/facet spurring eccentric to the left. L5-S1:Degenerative facet spurring. Left foraminal annular fissure. No neural compression. IMPRESSION: 1. Acute or subacute T12 compression fracture with moderate height loss. Remote, healed L3 compression fracture. 2. No evidence of discitis or osteomyelitis. 3. Generalized lumbar spine degeneration with scoliosis. Affected levels are described above. Electronically Signed   By: Dorn Roulette M.D.   On: 01/17/2023 10:19   CT ABDOMEN PELVIS W CONTRAST Result Date: 01/16/2023 CLINICAL  DATA:  Bacteremia. EXAM: CT ABDOMEN AND PELVIS WITH CONTRAST TECHNIQUE: Multidetector CT imaging of the abdomen and pelvis was performed using the standard protocol following bolus administration of intravenous contrast. RADIATION DOSE REDUCTION: This exam was performed according to the departmental dose-optimization program which includes automated exposure control, adjustment of the mA and/or kV according to patient size and/or use of iterative reconstruction technique. CONTRAST:  100mL OMNIPAQUE  IOHEXOL  300 MG/ML  SOLN COMPARISON:  None Available. FINDINGS: Lower chest: Minimal bilateral pleural effusions are noted with minimal adjacent subsegmental atelectasis. Hepatobiliary: No focal liver abnormality is seen. No gallstones, gallbladder wall thickening, or biliary dilatation. Pancreas: Unremarkable. No pancreatic  ductal dilatation or surrounding inflammatory changes. Spleen: Normal in size without focal abnormality. Adrenals/Urinary Tract: Adrenal glands appear normal. Right renal cyst is noted for which no further follow-up is required. No hydronephrosis or renal obstruction is noted. Mild urinary bladder distention is noted. Stomach/Bowel: Moderate gastric distention is noted. There is no evidence of bowel obstruction or inflammation. The appendix is unremarkable. Vascular/Lymphatic: No significant vascular findings are present. No enlarged abdominal or pelvic lymph nodes. Reproductive: Prostate is unremarkable. Other: No ascites or hernia is noted. Musculoskeletal: Severe compression deformity of T12 vertebral body is noted most consistent with old fracture. 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 Moderately depressed superior endplate fracture of L3 is noted which may be acute. Gas is noted in the L3-4 disc space and possible discitis and associated osteomyelitis cannot be excluded. Status post left total hip arthroplasty. Very large left hip joint effusion is noted and septic arthritis cannot  be excluded. Status post surgical internal fixation of old proximal right femoral fracture. IMPRESSION: Moderately depressed superior endplate fracture of L3 is noted which may be acute. Gas is also noted in the L3-4 disc space and therefore possible discitis and associated osteomyelitis cannot be excluded at this level. Further evaluation with MRI of the lumbar spine with and without gadolinium is recommended. Also noted is very large left hip joint effusion surrounding left total hip arthroplasty. Septic arthritis cannot be excluded. Moderate gastric distention. Minimal bilateral pleural effusions are noted with adjacent subsegmental atelectasis. These results will be called to the ordering clinician or representative by the Radiologist Assistant, and communication documented in the PACS or zVision Dashboard. Electronically Signed   By: Lynwood Landy Raddle M.D.   On: 01/16/2023 15:52   EEG adult Result Date: 01/15/2023 Shelton Arlin KIDD, MD     01/15/2023  4:58 AM Patient Name: Ozzie Knobel MRN: [id number] Epilepsy Attending: Arlin KIDD Shelton Referring Physician/Provider: Kandis Devaughn Sayres, MD Date: 01/14/2023 Duration: 31.06 mins Patient history: 73yo M with ams getting eeg to evaluate for seizure Level of alertness: Awake, asleep AEDs during EEG study: None Technical aspects: This EEG study was done with scalp electrodes positioned according to the 10-20 International system of electrode placement. Electrical activity was reviewed with band pass filter of 1-70Hz , sensitivity of 7 uV/mm, display speed of 78mm/sec with a 60Hz  notched filter applied as appropriate. EEG data were recorded continuously and digitally stored.  Video monitoring was available and reviewed as appropriate. Description: No clear posterior dominant rhythm was seen. Sleep was characterized by sleep spindles (12 to 14 Hz), maximal frontocentral region. EEG showed continuous generalized predominantly 5  to 7 Hz theta slowing admixed with  intermittent generalized 2-3Hz  delta slowing. Hyperventilation and photic stimulation were not performed.   ABNORMALITY - Continuous slow, generalized IMPRESSION: This study is suggestive of moderate diffuse encephalopathy. No seizures or epileptiform discharges were seen throughout the recording. Arlin KIDD Shelton   MR BRAIN W WO CONTRAST Result Date: 01/14/2023 CLINICAL DATA:  Seizure, new-onset, no history of trauma seizure concern EXAM: MRI HEAD WITHOUT AND WITH CONTRAST TECHNIQUE: Multiplanar, multiecho pulse sequences of the brain and surrounding structures were obtained without and with intravenous contrast. CONTRAST:  7mL GADAVIST  GADOBUTROL  1 MMOL/ML IV SOLN COMPARISON:  CT head 01/13/2023. FINDINGS: Brain: No acute infarction, hemorrhage, hydrocephalus, extra-axial collection or mass lesion. Cerebral atrophy. No pathologic enhancement. Vascular: Major arterial flow voids are maintained at the skull base. Skull and upper cervical spine: Normal marrow signal. Sinuses/Orbits: Negative. Other: No mastoid effusions. IMPRESSION:  No evidence of acute intracranial abnormality. Electronically Signed   By: Gilmore GORMAN Molt M.D.   On: 01/14/2023 11:25   DG Shoulder Left Result Date: 01/14/2023 CLINICAL DATA:  Left shoulder pain after fall EXAM: LEFT SHOULDER - 2+ VIEW COMPARISON:  Left shoulder x-ray 08/15/2017 FINDINGS: There is no evidence of fracture or dislocation. There is no evidence of arthropathy or other focal bone abnormality. Soft tissues are unremarkable. IMPRESSION: Negative. Electronically Signed   By: Greig Pique M.D.   On: 01/14/2023 03:20   CT Cervical Spine Wo Contrast Result Date: 01/13/2023 CLINICAL DATA:  Trauma fall EXAM: CT CERVICAL SPINE WITHOUT CONTRAST TECHNIQUE: Multidetector CT imaging of the cervical spine was performed without intravenous contrast. Multiplanar CT image reconstructions were also generated. RADIATION DOSE REDUCTION: This exam was performed according to the  departmental dose-optimization program which includes automated exposure control, adjustment of the mA and/or kV according to patient size and/or use of iterative reconstruction technique. COMPARISON:  CT 08/22/2022 FINDINGS: Alignment: Mild reversal of cervical lordosis. Trace anterolisthesis C3 on C4 and C4 on C5. Facet alignment is within normal limits. Skull base and vertebrae: No acute fracture. No primary bone lesion or focal pathologic process. Soft tissues and spinal canal: No prevertebral fluid or swelling. No visible canal hematoma. Disc levels: Multilevel degenerative changes. Partial ankylosis C5-C6. Mild disc space narrowing C3-C4 with moderate disc space narrowing C5-C6 and C6-C7. Facet degenerative changes at multiple levels. Upper chest: Emphysema Other: None IMPRESSION: Reversal of cervical lordosis. No acute osseous abnormality. Multilevel degenerative changes. Electronically Signed   By: Luke Bun M.D.   On: 01/13/2023 22:18   CT Head Wo Contrast Result Date: 01/13/2023 CLINICAL DATA:  Head trauma EXAM: CT HEAD WITHOUT CONTRAST TECHNIQUE: Contiguous axial images were obtained from the base of the skull through the vertex without intravenous contrast. RADIATION DOSE REDUCTION: This exam was performed according to the departmental dose-optimization program which includes automated exposure control, adjustment of the mA and/or kV according to patient size and/or use of iterative reconstruction technique. COMPARISON:  CT brain 08/22/2022 FINDINGS: Brain: No acute territorial infarction, hemorrhage or intracranial mass. Mild atrophy. Stable ventricle size Vascular: No hyperdense vessels. 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 No unexpected calcification Skull: Normal. Negative for fracture or focal lesion. Sinuses/Orbits: No acute finding. Other: None IMPRESSION: No CT evidence for acute intracranial abnormality. Mild atrophy. Electronically Signed   By: Luke Bun M.D.    On: 01/13/2023 22:13   DG Chest Port 1 View Result Date: 01/13/2023 CLINICAL DATA:  Altered mental status EXAM: PORTABLE CHEST 1 VIEW COMPARISON:  09/19/2022 FINDINGS: No acute airspace disease or effusion. Upper normal cardiac size. Mild central vascular congestion. Possible small left effusion. No pneumothorax IMPRESSION: Mild central vascular congestion. Possible small left effusion. Electronically Signed   By: Luke Bun M.D.   On: 01/13/2023 22:11    Microbiology: Results for orders placed or performed during the hospital encounter of 01/13/23  Resp panel by RT-PCR (RSV, Flu A&B, Covid) Anterior Nasal Swab     Status: None   Collection Time: 01/13/23  9:29 PM   Specimen: Anterior Nasal Swab  Result Value Ref Range Status   SARS Coronavirus 2 by RT PCR NEGATIVE NEGATIVE Final    Comment: (NOTE) SARS-CoV-2 target nucleic acids are NOT DETECTED.  The SARS-CoV-2 RNA is generally detectable in upper respiratory specimens during the acute phase of infection. The lowest concentration of SARS-CoV-2 viral copies this assay can detect is 138 copies/mL. A negative  result does not preclude SARS-Cov-2 infection and should not be used as the sole basis for treatment or other patient management decisions. A negative result may occur with  improper specimen collection/handling, submission of specimen other than nasopharyngeal swab, presence of viral mutation(s) within the areas targeted by this assay, and inadequate number of viral copies(<138 copies/mL). A negative result must be combined with clinical observations, patient history, and epidemiological information. The expected result is Negative.  Fact Sheet for Patients:  bloggercourse.com  Fact Sheet for Healthcare Providers:  seriousbroker.it  This test is no t yet approved or cleared by the [place]  FDA and  has been authorized for detection and/or diagnosis of SARS-CoV-2  by FDA under an Emergency Use Authorization (EUA). This EUA will remain  in effect (meaning this test can be used) for the duration of the COVID-19 declaration under Section 564(b)(1) of the Act, 21 U.S.C.section 360bbb-3(b)(1), unless the authorization is terminated  or revoked sooner.       Influenza A by PCR NEGATIVE NEGATIVE Final   Influenza B by PCR NEGATIVE NEGATIVE Final    Comment: (NOTE) The Xpert Xpress SARS-CoV-2/FLU/RSV plus assay is intended as an aid in the diagnosis of influenza from Nasopharyngeal swab specimens and should not be used as a sole basis for treatment. Nasal washings and aspirates are unacceptable for Xpert Xpress SARS-CoV-2/FLU/RSV testing.  Fact Sheet for Patients: bloggercourse.com  Fact Sheet for Healthcare Providers: seriousbroker.it  This test is not yet approved or cleared by the [place]  FDA and has been authorized for detection and/or diagnosis of SARS-CoV-2 by FDA under an Emergency Use Authorization (EUA). This EUA will remain in effect (meaning this test can be used) for the duration of the COVID-19 declaration under Section 564(b)(1) of the Act, 21 U.S.C. section 360bbb-3(b)(1), unless the authorization is terminated or revoked.     Resp Syncytial Virus by PCR NEGATIVE NEGATIVE Final    Comment: (NOTE) Fact Sheet for Patients: bloggercourse.com  Fact Sheet for Healthcare Providers: seriousbroker.it  This test is not yet approved or cleared by the [place]  FDA and has been authorized for detection and/or diagnosis of SARS-CoV-2 by FDA under an Emergency Use Authorization (EUA). This EUA will remain in effect (meaning this test can be used) for the duration of the COVID-19 declaration under Section 564(b)(1) of the Act, 21 U.S.C. section 360bbb-3(b)(1), unless the authorization is terminated or revoked.  Performed at  [facility], [address] [place], [place] [address]   Blood Culture (routine x 2)     Status: Abnormal   Collection Time: 01/13/23  9:29 PM   Specimen: BLOOD  Result Value Ref Range Status   Specimen Description   Final    BLOOD BLOOD LEFT ARM Performed at [facility], [address] [place], [place] [address]    Special Requests   Final    BOTTLES DRAWN AEROBIC AND ANAEROBIC Blood Culture adequate volume Performed at [facility], [address] [place], [place] [address]    Culture  Setup Time   Final    Organism ID to follow ANAEROBIC BOTTLE ONLY GRAM NEGATIVE RODS CRITICAL RESULT CALLED TO, READ BACK BY AND VERIFIED WITH: TREY GREENWOOD 01/14/23 1142 KLW Performed at [facility], [address] [place], [place] [address]    Culture ESCHERICHIA COLI (A)  Final   Report Status 01/16/2023 FINAL  Final   Organism ID, Bacteria ESCHERICHIA COLI  Final   Organism ID, Bacteria ESCHERICHIA COLI  Final  Susceptibility   Escherichia coli - KIRBY BAUER*    CEFAZOLIN  INTERMEDIATE Intermediate    Escherichia coli - MIC*    AMPICILLIN >=32 RESISTANT Resistant     CEFEPIME  <=0.12 SENSITIVE Sensitive     CEFTAZIDIME <=1 SENSITIVE Sensitive     CEFTRIAXONE  <=0.25 SENSITIVE Sensitive     CIPROFLOXACIN <=0.25 SENSITIVE Sensitive     GENTAMICIN  <=1 SENSITIVE Sensitive     IMIPENEM <=0.25 SENSITIVE Sensitive     TRIMETH/SULFA <=20 SENSITIVE Sensitive     AMPICILLIN/SULBACTAM 16 INTERMEDIATE Intermediate     PIP/TAZO <=4 SENSITIVE Sensitive ug/mL    * ESCHERICHIA COLI    ESCHERICHIA COLI  Urine Culture     Status: None   Collection Time: 01/13/23  9:29 PM   Specimen: Urine, Clean Catch  Result Value Ref Range Status   Specimen Description   Final    URINE, CLEAN CATCH Performed at [facility], [address], [place], [place] [address]    Special Requests   Final    NONE Performed at [facility], [address] [place], [place] [address]    Culture   Final    NO GROWTH Performed at [facility], [address] [place], [place] [address]    Report Status 01/15/2023 FINAL  Final  Blood Culture ID Panel (Reflexed)     Status: Abnormal   Collection Time: 01/13/23  9:29 PM  Result Value Ref Range Status   Enterococcus faecalis NOT DETECTED NOT DETECTED Final   Enterococcus Faecium NOT DETECTED NOT DETECTED Final   Listeria monocytogenes NOT DETECTED NOT DETECTED Final   Staphylococcus species NOT DETECTED NOT DETECTED Final   Staphylococcus aureus (BCID) NOT DETECTED NOT DETECTED Final   Staphylococcus epidermidis NOT DETECTED NOT DETECTED Final   Staphylococcus lugdunensis NOT DETECTED NOT DETECTED Final   Streptococcus species NOT DETECTED NOT DETECTED Final   Streptococcus agalactiae NOT DETECTED NOT DETECTED Final   Streptococcus pneumoniae NOT DETECTED NOT DETECTED Final   Streptococcus pyogenes NOT DETECTED NOT DETECTED Final   A.calcoaceticus-baumannii NOT DETECTED NOT DETECTED Final   Bacteroides fragilis NOT DETECTED NOT DETECTED Final   Enterobacterales DETECTED (A) NOT DETECTED Final    Comment: Enterobacterales represent a large order of gram negative bacteria, not a single organism. CRITICAL RESULT CALLED TO, READ BACK BY AND VERIFIED WITH: TREY GREENWOOD 01/14/23 1142 KLW    Enterobacter cloacae complex NOT DETECTED NOT DETECTED Final   Escherichia coli DETECTED (A) NOT DETECTED Final    Comment: CRITICAL RESULT CALLED TO, READ BACK BY AND VERIFIED WITH: TREY GREENWOOD 01/14/23 1142 KLW    Klebsiella aerogenes NOT DETECTED NOT DETECTED Final   Klebsiella oxytoca NOT DETECTED NOT DETECTED Final   Klebsiella pneumoniae NOT DETECTED NOT DETECTED Final   Proteus species NOT DETECTED NOT DETECTED Final   Salmonella species NOT DETECTED NOT DETECTED Final   Serratia marcescens NOT DETECTED NOT DETECTED Final   Haemophilus influenzae NOT DETECTED NOT DETECTED Final    Neisseria meningitidis NOT DETECTED NOT DETECTED Final   Pseudomonas aeruginosa NOT DETECTED NOT DETECTED Final   Stenotrophomonas maltophilia NOT DETECTED NOT DETECTED Final   Candida albicans NOT DETECTED NOT DETECTED Final   Candida auris NOT DETECTED NOT DETECTED Final   Candida glabrata NOT DETECTED NOT DETECTED Final   Candida krusei NOT DETECTED NOT DETECTED Final   Candida parapsilosis NOT DETECTED NOT DETECTED Final   Candida tropicalis NOT DETECTED NOT DETECTED Final   Cryptococcus neoformans/gattii NOT DETECTED NOT  DETECTED Final   CTX-M ESBL NOT DETECTED NOT DETECTED Final   Carbapenem resistance IMP NOT DETECTED NOT DETECTED Final   Carbapenem resistance KPC NOT DETECTED NOT DETECTED Final   Carbapenem resistance NDM NOT DETECTED NOT DETECTED Final   Carbapenem resist OXA 48 LIKE NOT DETECTED NOT DETECTED Final   Carbapenem resistance VIM NOT DETECTED NOT DETECTED Final    Comment: Performed at [facility], [address] [place], [place] [address]  Blood Culture (routine x 2)     Status: None   Collection Time: 01/13/23 11:06 PM   Specimen: BLOOD  Result Value Ref Range Status   Specimen Description BLOOD RIGHT ARM  Final   Special Requests   Final    BOTTLES DRAWN AEROBIC AND ANAEROBIC Blood Culture adequate volume   Culture   Final    NO GROWTH 5 DAYS Performed at [facility], [address] [place], [place] [address]    Report Status 01/19/2023 FINAL  Final  Respiratory (~20 pathogens) panel by PCR     Status: None   Collection Time: 01/14/23 12:15 PM   Specimen: Nasopharyngeal Swab; Respiratory  Result Value Ref Range Status   Adenovirus NOT DETECTED NOT DETECTED Final   Coronavirus 229E NOT DETECTED NOT DETECTED Final    Comment: (NOTE) The Coronavirus on the Respiratory Panel, DOES NOT test for the novel  Coronavirus (2019 nCoV)    Coronavirus HKU1 NOT DETECTED NOT DETECTED Final   Coronavirus NL63 NOT DETECTED NOT DETECTED Final    Coronavirus OC43 NOT DETECTED NOT DETECTED Final   Metapneumovirus NOT DETECTED NOT DETECTED Final   Rhinovirus / Enterovirus NOT DETECTED NOT DETECTED Final   Influenza A NOT DETECTED NOT DETECTED Final   Influenza B NOT DETECTED NOT DETECTED Final   Parainfluenza Virus 1 NOT DETECTED NOT DETECTED Final   Parainfluenza Virus 2 NOT DETECTED NOT DETECTED Final   Parainfluenza Virus 3 NOT DETECTED NOT DETECTED Final   Parainfluenza Virus 4 NOT DETECTED NOT DETECTED Final   Respiratory Syncytial Virus NOT DETECTED NOT DETECTED Final   Bordetella pertussis NOT DETECTED NOT DETECTED Final   Bordetella Parapertussis NOT DETECTED NOT DETECTED Final   Chlamydophila pneumoniae NOT DETECTED NOT DETECTED Final   Mycoplasma pneumoniae NOT DETECTED NOT DETECTED Final    Comment: Performed at [facility], [address], [place], [place] [address]  Culture, blood (Routine X 2) w Reflex to ID Panel     Status: None   Collection Time: 01/15/23  9:14 AM   Specimen: Left Antecubital; Blood  Result Value Ref Range Status   Specimen Description LEFT ANTECUBITAL  Final   Special Requests   Final    BLOOD BOTTLES DRAWN AEROBIC AND ANAEROBIC Blood Culture adequate volume   Culture   Final    NO GROWTH 5 DAYS Performed at [facility], [address] [place], [place] [address]    Report Status 01/20/2023 FINAL  Final  Culture, blood (Routine X 2) w Reflex to ID Panel     Status: None   Collection Time: 01/15/23  9:15 AM   Specimen: BLOOD RIGHT HAND  Result Value Ref Range Status   Specimen Description BLOOD RIGHT HAND  Final   Special Requests   Final    BLOOD BOTTLES DRAWN AEROBIC AND ANAEROBIC Blood Culture adequate volume   Culture   Final    NO GROWTH 5 DAYS Performed at [facility], [address], [place], [place] [address]  Report Status 01/20/2023 FINAL  Final  Aerobic/Anaerobic Culture w Gram Stain (surgical/deep wound)     Status: None   Collection Time:  01/18/23 11:34 AM   Specimen: Drain; Body Fluid  Result Value Ref Range Status   Specimen Description   Final    JP DRAINAGE Performed at [facility], [address] [place], [place] [address]    Special Requests   Final    NONE Performed at [facility], [address] [place], [place] [address]    Gram Stain   Final    ABUNDANT WBC PRESENT, PREDOMINANTLY PMN NO ORGANISMS SEEN    Culture   Final    No growth aerobically or anaerobically. Performed at [facility], [address], [place], [place] [address]    Report Status 01/23/2023 FINAL  Final  Aerobic/Anaerobic Culture w Gram Stain (surgical/deep wound)     Status: None   Collection Time: 01/19/23  1:45 PM   Specimen: Path Tissue  Result Value Ref Range Status   Specimen Description   Final    TISSUE Performed at [facility], [address] [place], [place] [address]    Special Requests LEFT HIP  Final   Gram Stain   Final    RARE WBC PRESENT, PREDOMINANTLY MONONUCLEAR NO ORGANISMS SEEN    Culture   Final    No growth aerobically or anaerobically. Performed at [facility], [address], [place], [place] [address]    Report Status 01/24/2023 FINAL  Final  Aerobic/Anaerobic Culture w Gram Stain (surgical/deep wound)     Status: None   Collection Time: 01/19/23  1:49 PM   Specimen: Path fluid; Body Fluid  Result Value Ref Range Status   Specimen Description   Final    FLUID Performed at [facility], [address], [place], [place] [address]    Special Requests   Final    NONE Performed at [facility], [address] [place], [place] [address]    Gram Stain NO WBC SEEN NO ORGANISMS SEEN   Final   Culture   Final    No growth aerobically or anaerobically. Performed at [facility], [address], [place], [place] [address]    Report Status 01/24/2023 FINAL  Final  Aerobic/Anaerobic Culture w Gram Stain (surgical/deep wound)      Status: None   Collection Time: 01/19/23  1:50 PM   Specimen: Path fluid; Body Fluid  Result Value Ref Range Status   Specimen Description   Final    FLUID Performed at [facility], [address] [place], [place] [address]    Special Requests   Final    NONE Performed at [facility], [address] [place], [place] [address]    Gram Stain   Final    RARE WBC PRESENT, PREDOMINANTLY MONONUCLEAR NO ORGANISMS SEEN    Culture   Final    No growth aerobically or anaerobically. Performed at [facility], [address], [place], [place] [address]    Report Status 01/24/2023 FINAL  Final    Labs: CBC: No results for input(s): WBC, NEUTROABS, HGB, HCT, MCV, PLT in the last 168 hours. Basic Metabolic Panel: Recent Labs  Lab 01/24/23 0600  NA 134*  K 4.1  CL 99  CO2 26  GLUCOSE 113*  BUN 19  CREATININE 0.76  CALCIUM 9.4   Liver Function Tests: No results for input(s): AST, ALT, ALKPHOS,  BILITOT, PROT, ALBUMIN in the last 168 hours. CBG: No results for input(s): GLUCAP in the last 168 hours.  Discharge time spent:  .  Signed: Drue ONEIDA Potter, MD [facility] 01/29/2023

## 2023-01-29 NOTE — Progress Notes (Signed)
   Subjective: 10 Days Post-Op Procedure(s) (LRB): HIP REVISION, HEAD AND LINER EXCHANGE (Left) IRRIGATION AND DEBRIDEMENT THIGH (Left) Patient reports pain as mild.   Patient is well, and has had no acute complaints or problems Denies any CP, SOB, ABD pain. We will continue therapy today.  Plan is to go Skilled nursing facility after hospital stay.  Objective: Vital signs in last 24 hours: Temp:  [97.4 F (36.3 C)-97.9 F (36.6 C)] 97.8 F (36.6 C) (01/02 0740) Pulse Rate:  [69-82] 73 (01/02 0740) Resp:  [16-18] 16 (01/02 0740) BP: (95-122)/(71-84) 95/71 (01/02 0740) SpO2:  [93 %-96 %] 93 % (01/02 0740)  Intake/Output from previous day: 01/01 0701 - 01/02 0700 In: 0  Out: 2201 [Urine:2200; Stool:1] Intake/Output this shift: No intake/output data recorded.  No results for input(s): HGB in the last 72 hours. No results for input(s): WBC, RBC, HCT, PLT in the last 72 hours. No results for input(s): NA, K, CL, CO2, BUN, CREATININE, GLUCOSE, CALCIUM in the last 72 hours. No results for input(s): LABPT, INR in the last 72 hours.  EXAM General - Patient is Alert and Appropriate Extremity - Neurovascular intact Sensation intact distally Intact pulses distally Dorsiflexion/Plantar flexion intact No cellulitis present Compartment soft Dressing - dressing C/D/I and no drainage Provena intact with out drainage Motor Function - intact, moving foot and toes well on exam.   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)    HLD (hyperlipidemia)    HTN (hypertension)    Schizophrenia (HCC)     Assessment/Plan:   10 Days Post-Op Procedure(s) (LRB): HIP REVISION, HEAD AND LINER EXCHANGE (Left) IRRIGATION AND DEBRIDEMENT THIGH  (Left) Principal Problem:   SIRS (systemic inflammatory response syndrome) (HCC) Active Problems:   Hypokalemia   Schizophrenia (HCC)   Chronic obstructive pulmonary disease (HCC)   HTN (hypertension)   AF (paroxysmal atrial fibrillation) (HCC)   Fall at home, initial encounter   Hyponatremia   Prolonged QT interval   Electrolyte disturbance   Hypophosphatemia   Acute metabolic encephalopathy   Left shoulder pain   Hypomagnesemia   Hypotension   E coli bacteremia   Sepsis without acute organ dysfunction (HCC)   Abscess of hip, left   Prosthetic joint infection (HCC)   Prosthetic joint infection of left hip (HCC)  Estimated body mass index is 24.27 kg/m as calculated from the following:   Height as of this encounter: 5' 11 (1.803 m).   Weight as of this encounter: 78.9 kg. Advance diet Up with therapy Pain controlled Continue with IV abx per ID VSS Incision sites appear well, provena intact with out drainage.  Follow up with [facility] in 5-7 days for removal of provena and staples   DVT Prophylaxis - Lovenox , TED hose, and SCDs Weight-Bearing as tolerated to left leg   T. Medford Amber, PA-C [facility] 01/29/2023, 9:14 AM

## 2023-01-29 NOTE — TOC Transition Note (Signed)
 Transition of Care [facility]) - Discharge Note   Patient Details  Name: Adrian Gillespie MRN: [id number] Date of Birth: 1950/08/19  Transition of [facility]) CM/SW Contact:  Corean ONEIDA Haddock, RN Phone Number: 01/29/2023, 12:55 PM   Clinical Narrative:     Confirmed with Tammy at [facility] that patient has 24 hours grace period and can still admit to the facility as long as its before midnight tonight  Patient will DC to: [place] rehab Anticipated DC date: 01/29/23  Family notified:sister francis  Transport by: ALBERTO  Per MD patient ready for DC to . RN, patient's family, and facility notified of DC. Discharge Summary sent to facility. RN given number for report. DC packet on chart. Ambulance transport requested for patient.   Charge nurse confirms signed script in packet TOC signing off.     Barriers to Discharge: Continued Medical Work up   Patient Goals and CMS Choice Patient states their goals for this hospitalization and ongoing recovery are:: Patient not fully oriented.   Choice offered to / list presented to : NA      Discharge Placement                       Discharge Plan and Services Additional resources added to the After Visit Summary for                              [facility] Agency: [facility] Date [facility] Agency Contacted: 01/15/23   Representative spoke with at [facility] Agency: Dorothe Essex  Social Drivers of Health (SDOH) Interventions SDOH Screenings   Food Insecurity: No Food Insecurity (01/15/2023)  Housing: Low Risk  (01/15/2023)  Transportation Needs: No Transportation Needs (01/15/2023)  Utilities: Not At Risk (01/15/2023)  Financial Resource Strain: Medium Risk (06/26/2022)   Received from [facility], [facility]  Physical Activity: Sufficiently Active (06/26/2022)   Received from [facility], [facility]  Social Connections: Moderately Isolated (01/27/2023)  Stress: No Stress Concern Present (06/26/2022)   Received from  [facility], [facility]  Tobacco Use: High Risk (01/19/2023)  Health Literacy: High Risk (06/26/2022)   Received from [facility], [facility]     Readmission Risk Interventions    01/16/2023    1:02 PM 01/15/2023    9:35 AM 09/20/2022    3:20 PM  Readmission Risk Prevention Plan  Transportation Screening Complete  Complete  PCP or Specialist Appt within 3-5 Days  Complete Complete  HRI or Home Care Consult Complete Complete Complete  Social Work Consult for Recovery Care Planning/Counseling Complete Complete Complete  Palliative Care Screening Complete Not Applicable Not Applicable  Medication Review Oceanographer) Complete  Complete

## 2023-01-29 NOTE — Plan of Care (Signed)
   Problem: Education: Goal: Knowledge of General Education information will improve Description: Including pain rating scale, medication(s)/side effects and non-pharmacologic comfort measures Outcome: Adequate for Discharge   Problem: Health Behavior/Discharge Planning: Goal: Ability to manage health-related needs will improve Outcome: Adequate for Discharge   Problem: Clinical Measurements: Goal: Ability to maintain clinical measurements within normal limits will improve Outcome: Adequate for Discharge Goal: Will remain free from infection Outcome: Adequate for Discharge Goal: Diagnostic test results will improve Outcome: Adequate for Discharge Goal: Respiratory complications will improve Outcome: Adequate for Discharge Goal: Cardiovascular complication will be avoided Outcome: Adequate for Discharge   Problem: Activity: Goal: Risk for activity intolerance will decrease Outcome: Adequate for Discharge   Problem: Nutrition: Goal: Adequate nutrition will be maintained Outcome: Adequate for Discharge   Problem: Coping: Goal: Level of anxiety will decrease Outcome: Adequate for Discharge   Problem: Elimination: Goal: Will not experience complications related to bowel motility Outcome: Adequate for Discharge Goal: Will not experience complications related to urinary retention Outcome: Adequate for Discharge   Problem: Pain Management: Goal: General experience of comfort will improve Outcome: Adequate for Discharge   Problem: Safety: Goal: Ability to remain free from injury will improve Outcome: Adequate for Discharge   Problem: Skin Integrity: Goal: Risk for impaired skin integrity will decrease Outcome: Adequate for Discharge   Problem: Education: Goal: Verbalization of understanding the information provided (i.e., activity precautions, restrictions, etc) will improve Outcome: Adequate for Discharge Goal: Individualized Educational Video(s) Outcome: Adequate for  Discharge   Problem: Activity: Goal: Ability to ambulate and perform ADLs will improve Outcome: Adequate for Discharge   Problem: Clinical Measurements: Goal: Postoperative complications will be avoided or minimized Outcome: Adequate for Discharge   Problem: Self-Concept: Goal: Ability to maintain and perform role responsibilities to the fullest extent possible will improve Outcome: Adequate for Discharge   Problem: Pain Management: Goal: Pain level will decrease Outcome: Adequate for Discharge

## 2023-02-19 ENCOUNTER — Inpatient Hospital Stay: Payer: Medicare Other | Admitting: Infectious Diseases

## 2023-03-03 ENCOUNTER — Inpatient Hospital Stay: Payer: Medicare Other | Admitting: Infectious Diseases

## 2023-03-05 ENCOUNTER — Telehealth: Payer: Self-pay

## 2023-03-05 NOTE — Telephone Encounter (Signed)
 Lela at [facility] called stating that patient IV abx end today and wanted to know if they should continue to get labs on patient until appointment on 2/20.   Myles Lela to continue to get Labs - Once weekly:  CBC/D, CMP, ESR and CRP.  Lela voiced her understanding.SABRA Annabella SHAUNNA Dalila, CMA

## 2023-03-19 ENCOUNTER — Inpatient Hospital Stay: Payer: Medicare Other | Admitting: Infectious Diseases

## 2023-03-19 NOTE — Progress Notes (Deleted)
The purpose of this virtual visit is to provide medical care while limiting exposure to the novel coronavirus (COVID19) for both patient and office staff.   Consent was obtained for phone visit:  Yes.   Answered questions that patient had about telehealth interaction:  Yes.   I discussed the limitations, risks, security and privacy concerns of performing an evaluation and management service by telephone. I also discussed with the patient that there may be a patient responsible charge related to this service. The patient expressed understanding and agreed to proceed.   Patient Location: Home Provider Location:
